# Patient Record
Sex: Female | Born: 1945
Health system: Southern US, Community
[De-identification: ages and names within clinical notes are randomized; demographics above are authoritative.]

## PROBLEM LIST (undated history)

## (undated) DIAGNOSIS — M1990 Unspecified osteoarthritis, unspecified site: Secondary | ICD-10-CM

## (undated) DIAGNOSIS — I10 Essential (primary) hypertension: Secondary | ICD-10-CM

## (undated) DIAGNOSIS — J449 Chronic obstructive pulmonary disease, unspecified: Secondary | ICD-10-CM

## (undated) DIAGNOSIS — C50919 Malignant neoplasm of unspecified site of unspecified female breast: Secondary | ICD-10-CM

## (undated) DIAGNOSIS — F32A Depression, unspecified: Secondary | ICD-10-CM

## (undated) DIAGNOSIS — E785 Hyperlipidemia, unspecified: Secondary | ICD-10-CM

## (undated) DIAGNOSIS — F329 Major depressive disorder, single episode, unspecified: Secondary | ICD-10-CM

## (undated) DIAGNOSIS — Z923 Personal history of irradiation: Secondary | ICD-10-CM

## (undated) HISTORY — DX: Malignant neoplasm of unspecified site of unspecified female breast: C50.919

## (undated) HISTORY — PX: ABDOMINAL HYSTERECTOMY: SHX81

## (undated) HISTORY — PX: OTHER SURGICAL HISTORY: SHX169

## (undated) HISTORY — DX: Essential (primary) hypertension: I10

## (undated) HISTORY — PX: TONSILLECTOMY: SUR1361

---

## 2004-08-21 ENCOUNTER — Other Ambulatory Visit: Payer: Self-pay

## 2004-08-22 ENCOUNTER — Ambulatory Visit: Payer: Self-pay | Admitting: General Surgery

## 2005-04-04 ENCOUNTER — Ambulatory Visit: Payer: Self-pay | Admitting: Internal Medicine

## 2006-09-27 ENCOUNTER — Ambulatory Visit: Payer: Self-pay

## 2011-01-24 ENCOUNTER — Ambulatory Visit: Payer: Self-pay | Admitting: Internal Medicine

## 2011-01-26 ENCOUNTER — Ambulatory Visit: Payer: Self-pay | Admitting: Internal Medicine

## 2011-02-01 ENCOUNTER — Ambulatory Visit: Payer: Self-pay | Admitting: Internal Medicine

## 2011-07-25 ENCOUNTER — Other Ambulatory Visit: Payer: Self-pay | Admitting: Internal Medicine

## 2011-07-25 MED ORDER — LISINOPRIL 20 MG PO TABS: 20.0000 mg | ORAL_TABLET | Freq: Every day | ORAL | Status: DC

## 2011-07-25 NOTE — Telephone Encounter (Signed)
Patient is out of her blood pressure medication

## 2011-08-07 ENCOUNTER — Encounter: Payer: Self-pay | Admitting: Internal Medicine

## 2011-08-07 ENCOUNTER — Ambulatory Visit (INDEPENDENT_AMBULATORY_CARE_PROVIDER_SITE_OTHER): Payer: Medicare Other | Admitting: Internal Medicine

## 2011-08-07 DIAGNOSIS — E785 Hyperlipidemia, unspecified: Secondary | ICD-10-CM

## 2011-08-07 DIAGNOSIS — Z23 Encounter for immunization: Secondary | ICD-10-CM

## 2011-08-07 DIAGNOSIS — M545 Low back pain, unspecified: Secondary | ICD-10-CM

## 2011-08-07 DIAGNOSIS — I10 Essential (primary) hypertension: Secondary | ICD-10-CM

## 2011-08-07 LAB — COMPREHENSIVE METABOLIC PANEL
ALT: 19 U/L (ref 0–35)
Albumin: 4.1 g/dL (ref 3.5–5.2)
CO2: 27 mEq/L (ref 19–32)
Calcium: 9.3 mg/dL (ref 8.4–10.5)
Chloride: 102 mEq/L (ref 96–112)
Creatinine, Ser: 0.9 mg/dL (ref 0.4–1.2)
GFR: 64.1 mL/min (ref >60.00)
Potassium: 4.6 mEq/L (ref 3.5–5.1)
Total Protein: 7.4 g/dL (ref 6.0–8.3)

## 2011-08-07 LAB — MICROALBUMIN / CREATININE URINE RATIO
Creatinine,U: 152.7 mg/dL
Microalb Creat Ratio: 0.4 mg/g (ref 0.0–30.0)
Microalb, Ur: 0.6 mg/dL (ref 0.0–1.9)

## 2011-08-07 LAB — LIPID PANEL: HDL: 62.1 mg/dL (ref >39.00)

## 2011-08-07 LAB — LDL CHOLESTEROL, DIRECT: Direct LDL: 143.4 mg/dL

## 2011-08-07 NOTE — Progress Notes (Signed)
Subjective:    Patient ID: Tammy Choi, female    DOB: 1946/01/06, 65 y.o.   MRN: [id number]  HPI 65 year old female with a history of hypertension and chronic low back pain presents for followup. She notes that she has been doing well. She reports full compliance with her medications. She did not bring a record of her blood pressures today. She denies any headache, palpitations, chest pain.  She continues to have right-sided hip and low back pain. This has been chronic and ongoing for years. She has been reluctant to pursue invasive intervention such as steroid injections. She reports good control of her pain using hydrocodone twice daily as needed. She would prefer to continue with this. She denies any weakness in her legs. She denies any loss of control of her bowel or bladder.  Outpatient Encounter Prescriptions as of 08/07/2011  Medication Sig Dispense Refill  . ALPRAZolam (XANAX) 0.5 MG tablet Take 0.5 mg by mouth 2 (two) times daily as needed.        Marland Kitchen FLUoxetine (PROZAC) 40 MG capsule Take 40 mg by mouth daily.        Marland Kitchen HYDROcodone-acetaminophen (LORTAB) 7.5-500 MG per tablet Take 2 tablets by mouth 2 (two) times daily as needed.        Marland Kitchen lisinopril (PRINIVIL,ZESTRIL) 20 MG tablet Take 1 tablet (20 mg total) by mouth daily.  30 tablet  11    Review of Systems  Constitutional: Negative for fever, chills, appetite change, fatigue and unexpected weight change.  HENT: Negative for ear pain, congestion, sore throat, trouble swallowing, neck pain, voice change and sinus pressure.   Eyes: Negative for visual disturbance.  Respiratory: Negative for cough, shortness of breath, wheezing and stridor.   Cardiovascular: Negative for chest pain, palpitations and leg swelling.  Gastrointestinal: Negative for nausea, vomiting, abdominal pain, diarrhea, constipation, blood in stool, abdominal distention and anal bleeding.  Genitourinary: Negative for dysuria and flank pain.  Musculoskeletal:  Positive for myalgias and back pain. Negative for arthralgias and gait problem.  Skin: Negative for color change and rash.  Neurological: Negative for dizziness and headaches.  Hematological: Negative for adenopathy. Does not bruise/bleed easily.  Psychiatric/Behavioral: Negative for suicidal ideas, sleep disturbance and dysphoric mood. The patient is not nervous/anxious.    BP 138/78  Pulse 85  Temp(Src) 98.2 F (36.8 C) (Oral)  Ht 5\' 6"  (1.676 m)  Wt 172 lb (78.019 kg)  BMI 27.76 kg/m2  SpO2 96%     Objective:   Physical Exam  Constitutional: She is oriented to person, place, and time. She appears well-developed and well-nourished. No distress.  HENT:  Head: Normocephalic and atraumatic.  Right Ear: External ear normal.  Left Ear: External ear normal.  Nose: Nose normal.  Mouth/Throat: Oropharynx is clear and moist. No oropharyngeal exudate.  Eyes: Conjunctivae are normal. Pupils are equal, round, and reactive to light. Right eye exhibits no discharge. Left eye exhibits no discharge. No scleral icterus.  Neck: Normal range of motion. Neck supple. No tracheal deviation present. No thyromegaly present.  Cardiovascular: Normal rate, regular rhythm, normal heart sounds and intact distal pulses.  Exam reveals no gallop and no friction rub.   No murmur heard. Pulmonary/Chest: Effort normal and breath sounds normal. No respiratory distress. She has no wheezes. She has no rales. She exhibits no tenderness.  Musculoskeletal: Normal range of motion. She exhibits no edema and no tenderness.       Lumbar back: She exhibits tenderness.  Back:  Lymphadenopathy:    She has no cervical adenopathy.  Neurological: She is alert and oriented to person, place, and time. No cranial nerve deficit. She exhibits normal muscle tone. Coordination normal.  Skin: Skin is warm and dry. No rash noted. She is not diaphoretic. No erythema. No pallor.  Psychiatric: She has a normal mood and affect. Her  behavior is normal. Judgment and thought content normal.          Assessment & Plan:  1. Hypertension -BP well-controlled on current medications. We'll check renal function with labs today. Patient will followup in 6 months.  2. Chronic sciatic/low back pain -patient reports good control with hydrocodone as needed. We discussed additional intervention such as steroid injections and evaluation by pain management, however she would prefer to defer this for now. Patient will followup in 6 months or earlier if needed.

## 2011-08-20 ENCOUNTER — Other Ambulatory Visit: Payer: Self-pay | Admitting: Internal Medicine

## 2011-09-05 ENCOUNTER — Ambulatory Visit: Payer: Self-pay | Admitting: Internal Medicine

## 2011-09-27 ENCOUNTER — Other Ambulatory Visit: Payer: Self-pay | Admitting: Internal Medicine

## 2011-09-27 ENCOUNTER — Encounter: Payer: Self-pay | Admitting: Internal Medicine

## 2011-09-28 ENCOUNTER — Encounter: Payer: Self-pay | Admitting: Internal Medicine

## 2011-10-22 ENCOUNTER — Other Ambulatory Visit: Payer: Self-pay | Admitting: Internal Medicine

## 2011-10-22 NOTE — Telephone Encounter (Signed)
Fine to refill Rx. Pt needs follow up to address elevated cholesterol. Should have repeat LFTs and lipids before 01/30/2012

## 2011-10-25 ENCOUNTER — Other Ambulatory Visit: Payer: Self-pay | Admitting: Internal Medicine

## 2011-10-29 ENCOUNTER — Other Ambulatory Visit: Payer: Self-pay | Admitting: Internal Medicine

## 2011-11-27 ENCOUNTER — Other Ambulatory Visit: Payer: Self-pay | Admitting: Internal Medicine

## 2011-11-27 NOTE — Telephone Encounter (Signed)
Fine to fill. 

## 2011-11-28 ENCOUNTER — Telehealth: Payer: Self-pay | Admitting: *Deleted

## 2011-11-28 NOTE — Telephone Encounter (Signed)
Pharm faxed RF request -  Glipizide 5 mg 1 bid. OK?

## 2011-11-28 NOTE — Telephone Encounter (Signed)
OK to fill

## 2011-11-29 ENCOUNTER — Other Ambulatory Visit: Payer: Self-pay | Admitting: Internal Medicine

## 2011-11-29 MED ORDER — GLIPIZIDE 5 MG PO TABS: 5.0000 mg | ORAL_TABLET | Freq: Two times a day (BID) | ORAL | Status: DC

## 2011-11-29 NOTE — Telephone Encounter (Signed)
Done

## 2011-12-03 ENCOUNTER — Telehealth: Payer: Self-pay | Admitting: *Deleted

## 2011-12-03 NOTE — Telephone Encounter (Signed)
If she is going to continue on Hydrocodone as chronic pain med (she is using 2-3 per day), she will need to come in every 3 months for visit and sign pain contract.  She will need to give urine sample for UDS. So, needs to be seen prior to refill.

## 2011-12-03 NOTE — Telephone Encounter (Signed)
1. Pt got RX for glipizide but states she has never been on this med. I specifically remember fax from pharm for pt for this RX. Med removed from list. 2. Patient is requesting RF for hydrocodone, she would like enough until f/u OV in May. OK?

## 2011-12-03 NOTE — Telephone Encounter (Signed)
Informed patient that she would need OV for RF (offered apt's this week), pt stated that she had some at this time and would call office to schedule apt when needed.

## 2012-01-15 ENCOUNTER — Ambulatory Visit (INDEPENDENT_AMBULATORY_CARE_PROVIDER_SITE_OTHER): Payer: Medicare Other | Admitting: Internal Medicine

## 2012-01-15 ENCOUNTER — Encounter: Payer: Self-pay | Admitting: Internal Medicine

## 2012-01-15 VITALS — BP 102/62 | HR 91 | Temp 98.2°F | Ht 66.0 in | Wt 172.0 lb

## 2012-01-15 DIAGNOSIS — F329 Major depressive disorder, single episode, unspecified: Secondary | ICD-10-CM | POA: Insufficient documentation

## 2012-01-15 DIAGNOSIS — M549 Dorsalgia, unspecified: Secondary | ICD-10-CM

## 2012-01-15 DIAGNOSIS — E785 Hyperlipidemia, unspecified: Secondary | ICD-10-CM | POA: Insufficient documentation

## 2012-01-15 DIAGNOSIS — I10 Essential (primary) hypertension: Secondary | ICD-10-CM

## 2012-01-15 DIAGNOSIS — F32A Depression, unspecified: Secondary | ICD-10-CM | POA: Insufficient documentation

## 2012-01-15 DIAGNOSIS — G8929 Other chronic pain: Secondary | ICD-10-CM | POA: Insufficient documentation

## 2012-01-15 LAB — COMPREHENSIVE METABOLIC PANEL
AST: 20 U/L (ref 0–37)
BUN: 18 mg/dL (ref 6–23)
Calcium: 8.9 mg/dL (ref 8.4–10.5)
Chloride: 103 mEq/L (ref 96–112)
Creatinine, Ser: 0.8 mg/dL (ref 0.4–1.2)
GFR: 72.99 mL/min (ref >60.00)

## 2012-01-15 LAB — LIPID PANEL
Cholesterol: 208 mg/dL — ABNORMAL HIGH (ref 0–200)
HDL: 58 mg/dL (ref >39.00)
Triglycerides: 126 mg/dL (ref 0.0–149.0)

## 2012-01-15 LAB — LDL CHOLESTEROL, DIRECT: Direct LDL: 128.3 mg/dL

## 2012-01-15 MED ORDER — FLUOXETINE HCL 40 MG PO CAPS: 40.0000 mg | ORAL_CAPSULE | Freq: Every day | ORAL | Status: DC

## 2012-01-15 MED ORDER — HYDROCODONE-ACETAMINOPHEN 7.5-500 MG PO TABS: 1.0000 | ORAL_TABLET | Freq: Three times a day (TID) | ORAL | Status: DC | PRN

## 2012-01-15 NOTE — Assessment & Plan Note (Signed)
Symptoms well controlled on Prozac. Will continue.

## 2012-01-15 NOTE — Assessment & Plan Note (Signed)
Secondary to degenerative arthritis in the spine. Currently well-controlled with occasional use of hydrocodone. Patient is not wish to pursue referral to pain management at this time. She is not interested in potential surgical interventions. We'll continue to monitor.

## 2012-01-15 NOTE — Progress Notes (Signed)
Subjective:    Patient ID: Tammy Choi, female    DOB: 1945-12-21, 66 y.o.   MRN: [id number]  HPI 66 year old female with history of depression, chronic back pain secondary to degenerative arthritis of the spine, and hypertension presents for followup. She reports that she is generally doing well. She reports that her chronic pain in her lower back is well-controlled with the occasional use of hydrocodone. Her pain is typically exacerbated by cooler weather. The maximum amount of hydrocodone she has used would be 2 tablets in one day. She is not interested in referral to the pain clinic or in surgical interventions at this time.  In regards to her depression, symptoms are well controlled with Prozac. She requests refill today.  In regards to hypertension, she reports compliance with her lisinopril. She denies any chest pain, headache, or other concerns.  Outpatient Encounter Prescriptions as of 01/15/2012  Medication Sig Dispense Refill  . ALPRAZolam (XANAX) 0.5 MG tablet TAKE ONE TABLET BY MOUTH TWICE DAILY  60 tablet  3  . cholecalciferol (VITAMIN D) 1000 UNITS tablet Take 2,000 Units by mouth daily.      Marland Kitchen FLUoxetine (PROZAC) 40 MG capsule Take 1 capsule (40 mg total) by mouth daily.  90 capsule  2  . HYDROcodone-acetaminophen (LORTAB) 7.5-500 MG per tablet Take 1 tablet by mouth every 8 (eight) hours as needed for pain.  90 tablet  0  . lisinopril (PRINIVIL,ZESTRIL) 20 MG tablet Take 1 tablet (20 mg total) by mouth daily.  30 tablet  11  . Multiple Vitamins-Minerals (MULTIVITAMIN WITH MINERALS) tablet Take 1 tablet by mouth daily.      Marland Kitchen DISCONTD: FLUoxetine (PROZAC) 40 MG capsule Take 40 mg by mouth daily.        Marland Kitchen DISCONTD: HYDROcodone-acetaminophen (LORTAB) 7.5-500 MG per tablet TAKE ONE TO TWO TABLETS BY MOUTH TWICE DAILY  90 tablet  0    Review of Systems  Constitutional: Negative for fever, chills, appetite change, fatigue and unexpected weight change.  HENT: Negative for  ear pain, congestion, sore throat, trouble swallowing, neck pain, voice change and sinus pressure.   Eyes: Negative for visual disturbance.  Respiratory: Negative for cough, shortness of breath, wheezing and stridor.   Cardiovascular: Negative for chest pain, palpitations and leg swelling.  Gastrointestinal: Negative for nausea, vomiting, abdominal pain, diarrhea, constipation, blood in stool, abdominal distention and anal bleeding.  Genitourinary: Negative for dysuria and flank pain.  Musculoskeletal: Positive for back pain and arthralgias. Negative for myalgias and gait problem.  Skin: Negative for color change and rash.  Neurological: Negative for dizziness and headaches.  Hematological: Negative for adenopathy. Does not bruise/bleed easily.  Psychiatric/Behavioral: Negative for suicidal ideas, sleep disturbance and dysphoric mood. The patient is not nervous/anxious.    BP 102/62  Pulse 91  Temp(Src) 98.2 F (36.8 C) (Oral)  Ht 5\' 6"  (1.676 m)  Wt 172 lb (78.019 kg)  BMI 27.76 kg/m2  SpO2 98%     Objective:   Physical Exam  Constitutional: She is oriented to person, place, and time. She appears well-developed and well-nourished. No distress.  HENT:  Head: Normocephalic and atraumatic.  Right Ear: External ear normal.  Left Ear: External ear normal.  Nose: Nose normal.  Mouth/Throat: Oropharynx is clear and moist. No oropharyngeal exudate.  Eyes: Conjunctivae are normal. Pupils are equal, round, and reactive to light. Right eye exhibits no discharge. Left eye exhibits no discharge. No scleral icterus.  Neck: Normal range of motion. Neck supple. No tracheal  deviation present. No thyromegaly present.  Cardiovascular: Normal rate, regular rhythm, normal heart sounds and intact distal pulses.  Exam reveals no gallop and no friction rub.   No murmur heard. Pulmonary/Chest: Effort normal and breath sounds normal. No respiratory distress. She has no wheezes. She has no rales. She  exhibits no tenderness.  Musculoskeletal: She exhibits no edema and no tenderness.       Lumbar back: She exhibits decreased range of motion and pain. She exhibits no tenderness.  Lymphadenopathy:    She has no cervical adenopathy.  Neurological: She is alert and oriented to person, place, and time. No cranial nerve deficit. She exhibits normal muscle tone. Coordination normal.  Skin: Skin is warm and dry. No rash noted. She is not diaphoretic. No erythema. No pallor.  Psychiatric: She has a normal mood and affect. Her behavior is normal. Judgment and thought content normal.          Assessment & Plan:

## 2012-01-15 NOTE — Assessment & Plan Note (Signed)
Blood pressure is well-controlled today. Will check renal function with labs. Followup here in 6 months.

## 2012-02-04 ENCOUNTER — Ambulatory Visit: Payer: Medicare Other | Admitting: Internal Medicine

## 2012-03-20 ENCOUNTER — Other Ambulatory Visit: Payer: Self-pay | Admitting: Internal Medicine

## 2012-03-20 NOTE — Telephone Encounter (Signed)
Rx called to [facility]

## 2012-04-17 ENCOUNTER — Other Ambulatory Visit: Payer: Self-pay | Admitting: Internal Medicine

## 2012-04-18 NOTE — Telephone Encounter (Signed)
Rx called to [facility]

## 2012-07-16 ENCOUNTER — Ambulatory Visit (INDEPENDENT_AMBULATORY_CARE_PROVIDER_SITE_OTHER): Payer: Medicare Other | Admitting: Internal Medicine

## 2012-07-16 ENCOUNTER — Encounter: Payer: Self-pay | Admitting: Internal Medicine

## 2012-07-16 VITALS — BP 120/82 | HR 88 | Temp 98.7°F | Ht 66.0 in | Wt 172.5 lb

## 2012-07-16 DIAGNOSIS — Z1211 Encounter for screening for malignant neoplasm of colon: Secondary | ICD-10-CM

## 2012-07-16 DIAGNOSIS — M549 Dorsalgia, unspecified: Secondary | ICD-10-CM

## 2012-07-16 DIAGNOSIS — Z23 Encounter for immunization: Secondary | ICD-10-CM

## 2012-07-16 DIAGNOSIS — D649 Anemia, unspecified: Secondary | ICD-10-CM

## 2012-07-16 DIAGNOSIS — I10 Essential (primary) hypertension: Secondary | ICD-10-CM

## 2012-07-16 DIAGNOSIS — E785 Hyperlipidemia, unspecified: Secondary | ICD-10-CM

## 2012-07-16 DIAGNOSIS — G8929 Other chronic pain: Secondary | ICD-10-CM

## 2012-07-16 DIAGNOSIS — Z0000 Encounter for general adult medical examination without abnormal findings: Secondary | ICD-10-CM | POA: Insufficient documentation

## 2012-07-16 LAB — CBC WITH DIFFERENTIAL/PLATELET
Basophils Relative: 0.5 % (ref 0.0–3.0)
Eosinophils Relative: 0.9 % (ref 0.0–5.0)
Lymphocytes Relative: 32.6 % (ref 12.0–46.0)
MCV: 88.2 fl (ref 78.0–100.0)
Monocytes Absolute: 0.7 10*3/uL (ref 0.1–1.0)
Neutrophils Relative %: 59.9 % (ref 43.0–77.0)
Platelets: 264 10*3/uL (ref 150.0–400.0)
RBC: 4.79 Mil/uL (ref 3.87–5.11)
WBC: 11.6 10*3/uL — ABNORMAL HIGH (ref 4.5–10.5)

## 2012-07-16 LAB — LIPID PANEL
Cholesterol: 222 mg/dL — ABNORMAL HIGH (ref 0–200)
HDL: 52.3 mg/dL (ref >39.00)
Total CHOL/HDL Ratio: 4
Triglycerides: 147 mg/dL (ref 0.0–149.0)

## 2012-07-16 LAB — COMPREHENSIVE METABOLIC PANEL
Albumin: 3.8 g/dL (ref 3.5–5.2)
Alkaline Phosphatase: 75 U/L (ref 39–117)
BUN: 22 mg/dL (ref 6–23)
CO2: 28 mEq/L (ref 19–32)
Calcium: 9.2 mg/dL (ref 8.4–10.5)
Chloride: 100 mEq/L (ref 96–112)
GFR: 77.15 mL/min (ref >60.00)
Glucose, Bld: 91 mg/dL (ref 70–99)
Potassium: 4.1 mEq/L (ref 3.5–5.1)
Sodium: 136 mEq/L (ref 135–145)
Total Protein: 7.2 g/dL (ref 6.0–8.3)

## 2012-07-16 LAB — MICROALBUMIN / CREATININE URINE RATIO: Microalb Creat Ratio: 0.8 mg/g (ref 0.0–30.0)

## 2012-07-16 LAB — LDL CHOLESTEROL, DIRECT: Direct LDL: 156.4 mg/dL

## 2012-07-16 NOTE — Progress Notes (Signed)
Subjective:    Patient ID: Tammy Choi, female    DOB: 18-Jul-1946, 66 y.o.   MRN: [id number]  HPI The patient is here for annual Medicare wellness examination and management of other chronic and acute problems.   The risk factors are reflected in the social history.  The roster of all physicians providing medical care to patient - is listed in the Snapshot section of the chart.  Activities of daily living:  The patient is 100% independent in all ADLs: dressing, toileting, feeding as well as independent mobility  Home safety : The patient has smoke detectors in the home. They wear seatbelts.  There are no firearms at home. There is no violence in the home.   There is no risks for hepatitis, STDs or HIV. There is no history of blood transfusion. They have no travel history to infectious disease endemic areas of the world.  The patient has not seen their dentist in the last six month. (Has dentures, Dr. Audley Hose) They have not seen their eye doctor in the last year. Will schedule appointment. Dermatologist - Dr. Jarold Motto. Will schedule. No issues with hearing. They have deferred audiologic testing in the last year.   They do not  have excessive sun exposure. Discussed the need for sun protection: hats, long sleeves and use of sunscreen if there is significant sun exposure.   Diet: the importance of a healthy diet is discussed. They do have a healthy diet.  The benefits of regular aerobic exercise were discussed. She walks every day if possible.  Depression screen: there are no signs or vegative symptoms of depression- irritability, change in appetite, anhedonia, sadness/tearfullness.  Cognitive assessment: the patient manages all their financial and personal affairs and is actively engaged. They could relate day,date,year and events.  The following portions of the patient's history were reviewed and updated as appropriate: allergies, current medications, past family history,  past medical history,  past surgical history, past social history  and problem list.  Visual acuity was not assessed per patient preference since she has regular follow up with her ophthalmologist. Hearing and body mass index were assessed and reviewed.   During the course of the visit the patient was educated and counseled about appropriate screening and preventive services including : fall prevention , diabetes screening, nutrition counseling, colorectal cancer screening, and recommended immunizations.     Outpatient Encounter Prescriptions as of 07/16/2012  Medication Sig Dispense Refill  . ALPRAZolam (XANAX) 0.5 MG tablet TAKE ONE TABLET BY MOUTH TWICE DAILY  60 tablet  1  . cholecalciferol (VITAMIN D) 1000 UNITS tablet Take 2,000 Units by mouth daily.      Marland Kitchen FLUoxetine (PROZAC) 40 MG capsule Take 1 capsule (40 mg total) by mouth daily.  90 capsule  2  . HYDROcodone-acetaminophen (LORTAB) 7.5-500 MG per tablet TAKE ONE TABLET BY MOUTH EVERY 8 HOURS AS NEEDED FOR PAIN  90 tablet  3  . lisinopril (PRINIVIL,ZESTRIL) 20 MG tablet Take 1 tablet (20 mg total) by mouth daily.  30 tablet  11  . Multiple Vitamins-Minerals (MULTIVITAMIN WITH MINERALS) tablet Take 1 tablet by mouth daily.       BP 120/82  Pulse 88  Temp 98.7 F (37.1 C) (Oral)  Ht 5\' 6"  (1.676 m)  Wt 172 lb 8 oz (78.245 kg)  BMI 27.84 kg/m2  SpO2 97%  Review of Systems  Constitutional: Negative for fever, chills, appetite change, fatigue and unexpected weight change.  HENT: Negative for ear pain, congestion, sore  throat, trouble swallowing, neck pain, voice change and sinus pressure.   Eyes: Negative for visual disturbance.  Respiratory: Negative for cough, shortness of breath, wheezing and stridor.   Cardiovascular: Negative for chest pain, palpitations and leg swelling.  Gastrointestinal: Negative for nausea, vomiting, abdominal pain, diarrhea, constipation, blood in stool, abdominal distention and anal bleeding.    Genitourinary: Negative for dysuria and flank pain.  Musculoskeletal: Positive for back pain and arthralgias. Negative for myalgias and gait problem.  Skin: Negative for color change and rash.  Neurological: Negative for dizziness and headaches.  Hematological: Negative for adenopathy. Does not bruise/bleed easily.  Psychiatric/Behavioral: Negative for suicidal ideas, disturbed wake/sleep cycle and dysphoric mood. The patient is not nervous/anxious.        Objective:   Physical Exam  Constitutional: She is oriented to person, place, and time. She appears well-developed and well-nourished. No distress.  HENT:  Head: Normocephalic and atraumatic.  Right Ear: External ear normal.  Left Ear: External ear normal.  Nose: Nose normal.  Mouth/Throat: Oropharynx is clear and moist. No oropharyngeal exudate.  Eyes: Conjunctivae normal are normal. Pupils are equal, round, and reactive to light. Right eye exhibits no discharge. Left eye exhibits no discharge. No scleral icterus.  Neck: Normal range of motion. Neck supple. No tracheal deviation present. No thyromegaly present.  Cardiovascular: Normal rate, regular rhythm, normal heart sounds and intact distal pulses.  Exam reveals no gallop and no friction rub.   No murmur heard. Pulmonary/Chest: Effort normal and breath sounds normal. No accessory muscle usage. Not tachypneic. No respiratory distress. She has no decreased breath sounds. She has no wheezes. She has no rhonchi. She has no rales. She exhibits no tenderness. Right breast exhibits no inverted nipple, no mass, no nipple discharge, no skin change and no tenderness. Left breast exhibits no inverted nipple, no mass, no nipple discharge, no skin change and no tenderness. Breasts are symmetrical.  Abdominal: Soft. Bowel sounds are normal. She exhibits no distension and no mass. There is no tenderness. There is no rebound and no guarding.  Musculoskeletal: Normal range of motion. She exhibits no  edema and no tenderness.  Lymphadenopathy:    She has no cervical adenopathy.  Neurological: She is alert and oriented to person, place, and time. No cranial nerve deficit. She exhibits normal muscle tone. Coordination normal.  Skin: Skin is warm and dry. No rash noted. She is not diaphoretic. No erythema. No pallor.  Psychiatric: She has a normal mood and affect. Her behavior is normal. Judgment and thought content normal.          Assessment & Plan:

## 2012-07-16 NOTE — Assessment & Plan Note (Signed)
General medical exam including breast exam normal today. Pap and pelvic exam deferred because of patient's age and status post hysterectomy. Health maintenance is up to date except for colonoscopy and mammogram which will be scheduled. Will check basic labs today including renal function, cholesterol, blood counts. Appropriate screening performed. Followup 6 months or sooner as needed.

## 2012-07-16 NOTE — Assessment & Plan Note (Signed)
Blood pressure well-controlled. Will continue lisinopril. Will check renal function with labs today. Followup in 6 months or sooner as needed.

## 2012-07-16 NOTE — Assessment & Plan Note (Signed)
Secondary to degenerative arthritis in the spine. Patient is doing well with intermittent use of Lortab for severe pain. We'll plan to continue. Follow up 6 months or sooner as needed.

## 2012-07-17 ENCOUNTER — Other Ambulatory Visit: Payer: Self-pay | Admitting: Internal Medicine

## 2012-07-17 ENCOUNTER — Telehealth: Payer: Self-pay | Admitting: *Deleted

## 2012-07-17 DIAGNOSIS — E785 Hyperlipidemia, unspecified: Secondary | ICD-10-CM

## 2012-07-17 MED ORDER — ATORVASTATIN CALCIUM 20 MG PO TABS: 20.0000 mg | ORAL_TABLET | Freq: Every day | ORAL | Status: DC

## 2012-07-17 NOTE — Telephone Encounter (Signed)
Rx sent to pharmacy, lab orders placed for 1 month, pt informed.

## 2012-07-17 NOTE — Telephone Encounter (Signed)
Message copied by Carin Primrose on Thu Jul 17, 2012 10:10 AM ------      Message from: Ronna Polio A      Created: Thu Jul 17, 2012  8:26 AM       Can you please call Lipitor 20mg  daily #30 with 3 refills. Needs repeat LFTs and lipids in 1 month.

## 2012-07-18 NOTE — Telephone Encounter (Signed)
Rx for Alprazolam called to [facility] pharmacy.

## 2012-07-23 ENCOUNTER — Telehealth: Payer: Self-pay | Admitting: Internal Medicine

## 2012-07-24 ENCOUNTER — Ambulatory Visit (INDEPENDENT_AMBULATORY_CARE_PROVIDER_SITE_OTHER): Payer: Medicare Other | Admitting: Internal Medicine

## 2012-07-24 ENCOUNTER — Ambulatory Visit (INDEPENDENT_AMBULATORY_CARE_PROVIDER_SITE_OTHER)
Admission: RE | Admit: 2012-07-24 | Discharge: 2012-07-24 | Disposition: A | Discharge status: Home / Self Care | Payer: Medicare Other | Source: Ambulatory Visit | Attending: Internal Medicine | Admitting: Internal Medicine

## 2012-07-24 ENCOUNTER — Encounter: Payer: Self-pay | Admitting: Internal Medicine

## 2012-07-24 VITALS — BP 128/72 | HR 94 | Temp 98.2°F | Ht 66.0 in | Wt 175.5 lb

## 2012-07-24 DIAGNOSIS — R062 Wheezing: Secondary | ICD-10-CM

## 2012-07-24 MED ORDER — ALBUTEROL SULFATE HFA 108 (90 BASE) MCG/ACT IN AERS: 2.0000 | INHALATION_SPRAY | Freq: Four times a day (QID) | RESPIRATORY_TRACT | Status: DC | PRN

## 2012-07-24 MED ORDER — PREDNISONE 10 MG PO TABS: 10.0000 mg | ORAL_TABLET | Freq: Every day | ORAL | Status: DC

## 2012-07-24 MED ORDER — ALBUTEROL SULFATE (2.5 MG/3ML) 0.083% IN NEBU: 2.5000 mg | INHALATION_SOLUTION | Freq: Four times a day (QID) | RESPIRATORY_TRACT | Status: DC | PRN

## 2012-07-24 MED ORDER — LEVOFLOXACIN 500 MG PO TABS: 500.0000 mg | ORAL_TABLET | Freq: Every day | ORAL | Status: DC

## 2012-07-24 MED ORDER — FLUTICASONE PROPIONATE 50 MCG/ACT NA SUSP: 2.0000 | Freq: Every day | NASAL | Status: DC

## 2012-07-24 MED ORDER — FLUTICASONE PROPIONATE HFA 110 MCG/ACT IN AERO: 2.0000 | INHALATION_SPRAY | Freq: Two times a day (BID) | RESPIRATORY_TRACT | Status: DC

## 2012-07-24 NOTE — Patient Instructions (Signed)
It was nice meeting you today.  I am sorry you have not been feeling well.  I am going to give you an antibiotic (Levaquin) to take once per day.  I also want you to take Mucinex in the am and Robitussin in the evening.  Flonase nasal spray - two sprays each nostril in the evening and flush with saline nasal spray - 2-3x/day.  I also feel that you need a Prednisone taper.  Take as instructed.  Use the inhalers as instructed.  Let us know if symptoms worsen or do not resolve.  We will notify you of your chest xray results once they are available.

## 2012-07-25 ENCOUNTER — Telehealth: Payer: Self-pay | Admitting: *Deleted

## 2012-07-25 NOTE — Progress Notes (Signed)
  Subjective:    Patient ID: Tammy Choi, female    DOB: 06/29/1946, 66 y.o.   MRN: [id number]  HPI 66 year old female with past history of hypertension who comes in today as a workin with concerns regarding increased cough and congestion.  States symptoms started with a sore throat approximately one week ago.  Now has increased sinus pressure, nasal congestion productive of yellow/green mucus production.  Ears itch.  Increased post nasal drainage.  Productive cough (colored mucus).  Increased wheezing.  Tmax 101.  States she is eating and drinking well.  No vomiting.  Has had some nausea, but not affecting eating.  Continues to smoke.  Lives with her daughter.  States her granddaughter has been sick with similar symptoms.  Took Nyquil two days ago and states this "dried her out" too much.    Past Medical History  Diagnosis Date  . HTN (hypertension)     Review of Systems Patient denies any headache, lightheadedness or dizziness.  Does report increased sinus pressure as outlined.  No earache.  Ears itch.  No chest pain or palpatations.  She does report the increased cough and congestion, wheezing and tightness in the chest.  Increased cough with attempts at taking a full breath.  No vomiting.  No abdominal pain or cramping.  No bowel change, such as diarrhea, constipation, BRBPR or melana.  No urine change.        Objective:   Physical Exam Filed Vitals:   07/24/12 0915  BP: 128/72  Pulse: 94  Temp: 98.2 F (80.30 C)   66 year old female in no acute distress.   HEENT:  Nares - clear except erythematous turbinates.  TMs visualized - without erythema.  OP- without lesions or erythema.  Increased tenderness to palpation over the sinuses. NECK:  Supple, nontender.    HEART:  Appears to be regular. LUNGS:  Without crackles or wheezing audible.  Respirations even and unlabored.   RADIAL PULSE:  Equal bilaterally.                   Assessment & Plan:  SINUSITIS/URI/BRONCHITIS.   Symptoms as outlined.  Will check CXR.  Albuterol neb given here in the office.  Noted increased air movement after the neb.  Will treat with Levaquin 500mg  q day as directed.  Mucinex DM in the am and Robitussin DM in the evening.  Flonase nasal spray and saline nasal flushes as directed.  Flovent bid and Albuterol HFA as directed.  Prednisone taper starting at 60mg  and decreasing by 5mg  each day until off.  Discussed possible side effects and risk of Prednisone therapy.  States she has taken previously and tolerated. Rest.  Fluids.  Explained to her if symptoms changed, worsened or did not resolve - she needed reevaluation.  Discussed need to quit smoking.

## 2012-07-28 ENCOUNTER — Telehealth: Payer: Self-pay | Admitting: *Deleted

## 2012-07-28 ENCOUNTER — Encounter: Payer: Self-pay | Admitting: Internal Medicine

## 2012-07-28 NOTE — Telephone Encounter (Signed)
Called patient at home with results.

## 2012-08-08 NOTE — Telephone Encounter (Signed)
Opened in error

## 2012-08-10 ENCOUNTER — Other Ambulatory Visit: Payer: Self-pay | Admitting: Internal Medicine

## 2012-08-18 ENCOUNTER — Other Ambulatory Visit: Payer: Medicare Other

## 2012-09-03 ENCOUNTER — Telehealth: Payer: Self-pay | Admitting: Internal Medicine

## 2012-09-03 NOTE — Telephone Encounter (Signed)
Pt has miss placed her rx for Lipitor. She uses [facility] on [address] and she does not have any left.

## 2012-09-03 NOTE — Telephone Encounter (Signed)
I believe this is Dr Tilman Neat pt.  I think she will be ok with refilling, but wanted to send to you to confirm.

## 2012-09-04 MED ORDER — ATORVASTATIN CALCIUM 20 MG PO TABS: 20.0000 mg | ORAL_TABLET | Freq: Every day | ORAL | Status: DC

## 2012-09-04 NOTE — Telephone Encounter (Signed)
Meds filled

## 2012-09-05 ENCOUNTER — Ambulatory Visit: Payer: Self-pay | Admitting: Internal Medicine

## 2012-09-05 ENCOUNTER — Telehealth: Payer: Self-pay | Admitting: Internal Medicine

## 2012-09-05 DIAGNOSIS — N6320 Unspecified lump in the left breast, unspecified quadrant: Secondary | ICD-10-CM | POA: Insufficient documentation

## 2012-09-05 NOTE — Telephone Encounter (Signed)
Mammogram 09/05/2012, recommended compression views of right breast.  Has this been ordered? Please make sure pt has follow up to discuss. Thanks

## 2012-09-10 NOTE — Telephone Encounter (Signed)
Called pt no answer °

## 2012-09-11 NOTE — Telephone Encounter (Signed)
Pt is having these additional views done tomorrow morning. Advised to call office to set up follow up afterwards.

## 2012-09-12 ENCOUNTER — Ambulatory Visit: Payer: Self-pay | Admitting: Internal Medicine

## 2012-09-15 ENCOUNTER — Telehealth: Payer: Self-pay | Admitting: Internal Medicine

## 2012-09-15 ENCOUNTER — Other Ambulatory Visit: Payer: Medicare Other

## 2012-09-15 DIAGNOSIS — R928 Other abnormal and inconclusive findings on diagnostic imaging of breast: Secondary | ICD-10-CM

## 2012-09-15 NOTE — Telephone Encounter (Signed)
Recent compression views on mammogram and US of the RIGHT breast showed normal tissue, but when they did an [place] on the left breast for comparison, they saw a nodular area in the left breast. They have recommended surgical evaluation.  We should make sure that: 1. This was communicated to the pt 2. The pt has been scheduled to see a surgeon 3. Pt has follow up with me to discuss findings.

## 2012-09-16 NOTE — Telephone Encounter (Signed)
OK. Will place referral order.

## 2012-09-16 NOTE — Telephone Encounter (Signed)
LMOVM for pt to return call 

## 2012-09-16 NOTE — Telephone Encounter (Signed)
Pt advised that she was never notified of the results. Would like for a referral to be made for surgeon. However pt will be out of town until January 1st.

## 2012-09-25 ENCOUNTER — Encounter: Payer: Self-pay | Admitting: Internal Medicine

## 2012-10-01 HISTORY — PX: COLONOSCOPY: SHX174

## 2012-10-15 ENCOUNTER — Other Ambulatory Visit: Payer: Self-pay | Admitting: Internal Medicine

## 2012-10-15 MED ORDER — HYDROCODONE-ACETAMINOPHEN 5-325 MG PO TABS: 1.0000 | ORAL_TABLET | Freq: Three times a day (TID) | ORAL | Status: DC | PRN

## 2012-10-15 NOTE — Telephone Encounter (Signed)
We can call in Hydrocodone-Acetaminophen 5-325mg  po tid prn pain #90 with 1 refill. Please let her know that the FDA is changing restrictions on hydrocodone, and if she will need to continue this long term for back pain, we will need to set up referral at the pain clinic.

## 2012-10-15 NOTE — Telephone Encounter (Signed)
This product is no longer available HYDROcodone-acetaminophen (LORTAB) 7.5-500 MG per tablet Please advise

## 2012-10-15 NOTE — Telephone Encounter (Signed)
Rx called to [facility] pharmacy, patient advised via telephone.

## 2012-10-16 ENCOUNTER — Telehealth: Payer: Self-pay | Admitting: *Deleted

## 2012-10-16 NOTE — Telephone Encounter (Signed)
Patient called wanting to know the results of her biopsy.  I called and spoke with Archie Patten at [facility] and was advised that the biopsy was done on 10/19/2012 and often times it can take up to one week for the results to be in.  The patient will be notified of results by either the doctor or nurse once they become available.  I left a message on machine at home for patient to return call.

## 2012-10-17 NOTE — Telephone Encounter (Signed)
Patient called back she received her biopsy results.

## 2012-11-10 ENCOUNTER — Other Ambulatory Visit: Payer: Self-pay | Admitting: Internal Medicine

## 2012-11-10 NOTE — Telephone Encounter (Signed)
Has not been seen in awhile and has cancelled several appointments. Ok to refill Xanax

## 2013-01-06 ENCOUNTER — Encounter: Payer: Self-pay | Admitting: Internal Medicine

## 2013-01-06 ENCOUNTER — Ambulatory Visit (INDEPENDENT_AMBULATORY_CARE_PROVIDER_SITE_OTHER): Payer: Medicare Other | Admitting: Internal Medicine

## 2013-01-06 VITALS — BP 136/86 | HR 98 | Temp 99.1°F | Wt 169.0 lb

## 2013-01-06 DIAGNOSIS — J441 Chronic obstructive pulmonary disease with (acute) exacerbation: Secondary | ICD-10-CM

## 2013-01-06 MED ORDER — LEVOFLOXACIN 500 MG PO TABS: 500.0000 mg | ORAL_TABLET | Freq: Every day | ORAL | Status: DC

## 2013-01-06 MED ORDER — PREDNISONE (PAK) 10 MG PO TABS: ORAL_TABLET | ORAL | Status: DC

## 2013-01-06 NOTE — Assessment & Plan Note (Signed)
Symptoms and exam consistent with COPD exacerbation from acute bronchitis. Will start levaquin and prednisone taper. Albuterol/atrovent given in clinic. Will continue inhaled bronchodilators at home. Encouraged smoking cessation.  Pt will follow up if symptoms not improving over next 48hr. RTC for recheck in 2 weeks.

## 2013-01-06 NOTE — Progress Notes (Signed)
Subjective:    Patient ID: Tammy Choi, female    DOB: 01/28/1946, 67 y.o.   MRN: [id number]  HPI 67YO female with COPD, HTN, HL presents for acute visit c/o 2 weeks cough, shortness of breath, wheezing. Subjective fever and chills. Cough productive of purulent mucous. No chest pain. Using inhaled albuterol with minimal improvement.   Outpatient Encounter Prescriptions as of 01/06/2013  Medication Sig Dispense Refill  . albuterol (PROVENTIL HFA;VENTOLIN HFA) 108 (90 BASE) MCG/ACT inhaler Inhale 2 puffs into the lungs every 6 (six) hours as needed for wheezing.  1 Inhaler  0  . albuterol (PROVENTIL) (2.5 MG/3ML) 0.083% nebulizer solution Take 3 mLs (2.5 mg total) by nebulization every 6 (six) hours as needed for wheezing.  150 mL  1  . ALPRAZolam (XANAX) 0.5 MG tablet TAKE ONE TABLET BY MOUTH TWICE DAILY  60 tablet  1  . atorvastatin (LIPITOR) 20 MG tablet Take 1 tablet (20 mg total) by mouth daily.  30 tablet  3  . cholecalciferol (VITAMIN D) 1000 UNITS tablet Take 2,000 Units by mouth daily.      Marland Kitchen FLUoxetine (PROZAC) 40 MG capsule Take 1 capsule (40 mg total) by mouth daily.  90 capsule  2  . fluticasone (FLONASE) 50 MCG/ACT nasal spray Place 2 sprays into the nose daily.  16 g  0  . fluticasone (FLOVENT HFA) 110 MCG/ACT inhaler Inhale 2 puffs into the lungs 2 (two) times daily.  1 Inhaler  0  . HYDROcodone-acetaminophen (NORCO/VICODIN) 5-325 MG per tablet Take 1 tablet by mouth 3 (three) times daily as needed.  90 tablet  1  . lisinopril (PRINIVIL,ZESTRIL) 20 MG tablet TAKE ONE TABLET BY MOUTH EVERY DAY  30 tablet  10  . Multiple Vitamins-Minerals (MULTIVITAMIN WITH MINERALS) tablet Take 1 tablet by mouth daily.      Marland Kitchen levofloxacin (LEVAQUIN) 500 MG tablet Take 1 tablet (500 mg total) by mouth daily.  7 tablet  0  . predniSONE (STERAPRED UNI-PAK) 10 MG tablet Take 60mg  day 1 then taper by 10mg  daily  21 tablet  0   No facility-administered encounter medications on file as of 01/06/2013.    BP 136/86  Pulse 98  Temp(Src) 99.1 F (37.3 C) (Oral)  Wt 154 lb (69.854 kg)  BMI 24.87 kg/m2  SpO2 94%  Review of Systems  Constitutional: Positive for fever, chills and fatigue. Negative for appetite change and unexpected weight change.  HENT: Positive for congestion and rhinorrhea. Negative for ear pain, sore throat, trouble swallowing, neck pain, voice change and sinus pressure.   Eyes: Negative for visual disturbance.  Respiratory: Positive for cough, shortness of breath and wheezing. Negative for stridor.   Cardiovascular: Negative for chest pain, palpitations and leg swelling.  Gastrointestinal: Negative for nausea, vomiting, abdominal pain, diarrhea, constipation, blood in stool, abdominal distention and anal bleeding.  Genitourinary: Negative for dysuria and flank pain.  Musculoskeletal: Negative for myalgias, arthralgias and gait problem.  Skin: Negative for color change and rash.  Neurological: Negative for dizziness and headaches.  Hematological: Negative for adenopathy. Does not bruise/bleed easily.  Psychiatric/Behavioral: Negative for suicidal ideas, sleep disturbance and dysphoric mood. The patient is not nervous/anxious.        Objective:   Physical Exam  Constitutional: She is oriented to person, place, and time. She appears well-developed and well-nourished. No distress.  HENT:  Head: Normocephalic and atraumatic.  Right Ear: External ear normal.  Left Ear: External ear normal.  Nose: Nose normal.  Mouth/Throat: Oropharynx  is clear and moist. No oropharyngeal exudate.  Eyes: Conjunctivae are normal. Pupils are equal, round, and reactive to light. Right eye exhibits no discharge. Left eye exhibits no discharge. No scleral icterus.  Neck: Normal range of motion. Neck supple. No tracheal deviation present. No thyromegaly present.  Cardiovascular: Normal rate, regular rhythm, normal heart sounds and intact distal pulses.  Exam reveals no gallop and no friction  rub.   No murmur heard. Pulmonary/Chest: Accessory muscle usage (with minimal exertion) present. No respiratory distress. She has decreased breath sounds. She has wheezes. She has rhonchi. She has no rales. She exhibits no tenderness.  Musculoskeletal: Normal range of motion. She exhibits no edema and no tenderness.  Lymphadenopathy:    She has no cervical adenopathy.  Neurological: She is alert and oriented to person, place, and time. No cranial nerve deficit. She exhibits normal muscle tone. Coordination normal.  Skin: Skin is warm and dry. No rash noted. She is not diaphoretic. No erythema. No pallor.  Psychiatric: She has a normal mood and affect. Her behavior is normal. Judgment and thought content normal.          Assessment & Plan:

## 2013-01-08 ENCOUNTER — Other Ambulatory Visit: Payer: Self-pay | Admitting: Internal Medicine

## 2013-01-16 ENCOUNTER — Ambulatory Visit: Payer: Medicare Other | Admitting: Internal Medicine

## 2013-01-21 ENCOUNTER — Encounter: Payer: Self-pay | Admitting: Internal Medicine

## 2013-01-21 ENCOUNTER — Ambulatory Visit (INDEPENDENT_AMBULATORY_CARE_PROVIDER_SITE_OTHER): Payer: Medicare Other | Admitting: Internal Medicine

## 2013-01-21 VITALS — BP 108/72 | HR 80 | Temp 98.4°F | Wt 171.0 lb

## 2013-01-21 DIAGNOSIS — Z1211 Encounter for screening for malignant neoplasm of colon: Secondary | ICD-10-CM

## 2013-01-21 DIAGNOSIS — J449 Chronic obstructive pulmonary disease, unspecified: Secondary | ICD-10-CM

## 2013-01-21 DIAGNOSIS — M549 Dorsalgia, unspecified: Secondary | ICD-10-CM

## 2013-01-21 DIAGNOSIS — I10 Essential (primary) hypertension: Secondary | ICD-10-CM

## 2013-01-21 DIAGNOSIS — G8929 Other chronic pain: Secondary | ICD-10-CM

## 2013-01-21 DIAGNOSIS — E785 Hyperlipidemia, unspecified: Secondary | ICD-10-CM

## 2013-01-21 LAB — LIPID PANEL
Cholesterol: 134 mg/dL (ref 0–200)
HDL: 51.4 mg/dL (ref >39.00)
LDL Cholesterol: 69 mg/dL (ref 0–99)
Total CHOL/HDL Ratio: 3
Triglycerides: 69 mg/dL (ref 0.0–149.0)
VLDL: 13.8 mg/dL (ref 0.0–40.0)

## 2013-01-21 LAB — COMPREHENSIVE METABOLIC PANEL
AST: 21 U/L (ref 0–37)
Albumin: 3.4 g/dL — ABNORMAL LOW (ref 3.5–5.2)
Alkaline Phosphatase: 71 U/L (ref 39–117)
BUN: 16 mg/dL (ref 6–23)
Creatinine, Ser: 0.8 mg/dL (ref 0.4–1.2)
Glucose, Bld: 102 mg/dL — ABNORMAL HIGH (ref 70–99)
Total Bilirubin: 0.5 mg/dL (ref 0.3–1.2)

## 2013-01-21 NOTE — Progress Notes (Signed)
Subjective:    Patient ID: Tammy Choi, female    DOB: 10/10/1945, 67 y.o.   MRN: [id number]  HPI 67 year old female with history of hypertension, chronic back pain, hyperlipidemia, and COPD presents for followup. She recently had a COPD exacerbation with acute bronchitis and was treated with Levaquin and prednisone. She reports that symptoms have improved. She continues to have some intermittent cough productive of clear to white sputum mostly in the mornings. She continues to smoke. She has been using her inhaled albuterol as directed. Shortness of breath has significantly improved.  In regards to hypertension hyperlipidemia, she notes compliance with her medications. In regards to chronic back pain, she reports symptoms have been well-controlled with intermittent use of hydrocodone.  Outpatient Encounter Prescriptions as of 01/21/2013  Medication Sig Dispense Refill  . albuterol (PROVENTIL HFA;VENTOLIN HFA) 108 (90 BASE) MCG/ACT inhaler Inhale 2 puffs into the lungs every 6 (six) hours as needed for wheezing.  1 Inhaler  0  . albuterol (PROVENTIL) (2.5 MG/3ML) 0.083% nebulizer solution Take 3 mLs (2.5 mg total) by nebulization every 6 (six) hours as needed for wheezing.  150 mL  1  . ALPRAZolam (XANAX) 0.5 MG tablet TAKE ONE TABLET BY MOUTH TWICE DAILY  60 tablet  0  . atorvastatin (LIPITOR) 20 MG tablet Take 1 tablet (20 mg total) by mouth daily.  30 tablet  3  . cholecalciferol (VITAMIN D) 1000 UNITS tablet Take 2,000 Units by mouth daily.      Marland Kitchen FLUoxetine (PROZAC) 40 MG capsule Take 1 capsule (40 mg total) by mouth daily.  90 capsule  2  . fluticasone (FLONASE) 50 MCG/ACT nasal spray Place 2 sprays into the nose daily.  16 g  0  . fluticasone (FLOVENT HFA) 110 MCG/ACT inhaler Inhale 2 puffs into the lungs 2 (two) times daily.  1 Inhaler  0  . HYDROcodone-acetaminophen (NORCO/VICODIN) 5-325 MG per tablet Take 1 tablet by mouth 3 (three) times daily as needed.  90 tablet  1  .  lisinopril (PRINIVIL,ZESTRIL) 20 MG tablet TAKE ONE TABLET BY MOUTH EVERY DAY  30 tablet  10  . Multiple Vitamins-Minerals (MULTIVITAMIN WITH MINERALS) tablet Take 1 tablet by mouth daily.      . [DISCONTINUED] levofloxacin (LEVAQUIN) 500 MG tablet Take 1 tablet (500 mg total) by mouth daily.  7 tablet  0  . [DISCONTINUED] predniSONE (STERAPRED UNI-PAK) 10 MG tablet Take 60mg  day 1 then taper by 10mg  daily  21 tablet  0   No facility-administered encounter medications on file as of 01/21/2013.   BP 108/72  Pulse 80  Temp(Src) 98.4 F (36.9 C) (Oral)  Wt 171 lb (77.565 kg)  BMI 27.61 kg/m2  SpO2 94%  Review of Systems  Constitutional: Negative for fever, chills, appetite change, fatigue and unexpected weight change.  HENT: Negative for ear pain, congestion, sore throat, trouble swallowing, neck pain, voice change and sinus pressure.   Eyes: Negative for visual disturbance.  Respiratory: Positive for cough. Negative for shortness of breath, wheezing and stridor.   Cardiovascular: Negative for chest pain, palpitations and leg swelling.  Gastrointestinal: Negative for nausea, vomiting, abdominal pain, diarrhea, constipation, blood in stool, abdominal distention and anal bleeding.  Genitourinary: Negative for dysuria and flank pain.  Musculoskeletal: Positive for myalgias, back pain and arthralgias. Negative for gait problem.  Skin: Negative for color change and rash.  Neurological: Negative for dizziness and headaches.  Hematological: Negative for adenopathy. Does not bruise/bleed easily.  Psychiatric/Behavioral: Negative for suicidal ideas, sleep  disturbance and dysphoric mood. The patient is not nervous/anxious.        Objective:   Physical Exam  Constitutional: She is oriented to person, place, and time. She appears well-developed and well-nourished. No distress.  HENT:  Head: Normocephalic and atraumatic.  Right Ear: External ear normal.  Left Ear: External ear normal.  Nose:  Nose normal.  Mouth/Throat: Oropharynx is clear and moist. No oropharyngeal exudate.  Eyes: Conjunctivae are normal. Pupils are equal, round, and reactive to light. Right eye exhibits no discharge. Left eye exhibits no discharge. No scleral icterus.  Neck: Normal range of motion. Neck supple. No tracheal deviation present. No thyromegaly present.  Cardiovascular: Normal rate, regular rhythm, normal heart sounds and intact distal pulses.  Exam reveals no gallop and no friction rub.   No murmur heard. Pulmonary/Chest: Effort normal and breath sounds normal. No accessory muscle usage. Not tachypneic. No respiratory distress. She has no decreased breath sounds. She has no wheezes. She has no rhonchi. She has no rales. She exhibits no tenderness.  Musculoskeletal: Normal range of motion. She exhibits no edema and no tenderness.  Lymphadenopathy:    She has no cervical adenopathy.  Neurological: She is alert and oriented to person, place, and time. No cranial nerve deficit. She exhibits normal muscle tone. Coordination normal.  Skin: Skin is warm and dry. No rash noted. She is not diaphoretic. No erythema. No pallor.  Psychiatric: She has a normal mood and affect. Her behavior is normal. Judgment and thought content normal.          Assessment & Plan:

## 2013-01-21 NOTE — Assessment & Plan Note (Signed)
Secondary to degenerative arthritis. Symptoms are well controlled with use of intermittent hydrocodone. Will continue.

## 2013-01-21 NOTE — Assessment & Plan Note (Signed)
Will check lipids and LFTs with labs today. Continue Atorvastatin. 

## 2013-01-21 NOTE — Assessment & Plan Note (Signed)
BP Readings from Last 3 Encounters:  01/21/13 108/72  01/06/13 136/86  07/24/12 128/72   Blood pressure well-controlled on lisinopril. Will continue.

## 2013-01-21 NOTE — Assessment & Plan Note (Signed)
Symptoms have improved after recent exacerbation. Will continue inhaled bronchodilators. Will continue inhaled steroid. Encourage smoking cessation. Discussed a screening for lung cancer and new guidelines recommending CT of the chest yearly. Patient would like to hold off for now. Followup in 6 months and sooner as needed.

## 2013-02-09 ENCOUNTER — Other Ambulatory Visit: Payer: Self-pay | Admitting: Internal Medicine

## 2013-02-12 ENCOUNTER — Ambulatory Visit (INDEPENDENT_AMBULATORY_CARE_PROVIDER_SITE_OTHER): Payer: Medicare Other | Admitting: General Surgery

## 2013-02-12 ENCOUNTER — Encounter: Payer: Self-pay | Admitting: General Surgery

## 2013-02-12 VITALS — BP 128/78 | HR 80 | Resp 16 | Ht 66.0 in | Wt 171.0 lb

## 2013-02-12 DIAGNOSIS — Z1211 Encounter for screening for malignant neoplasm of colon: Secondary | ICD-10-CM

## 2013-02-12 MED ORDER — POLYETHYLENE GLYCOL 3350 17 GM/SCOOP PO POWD: ORAL | Status: DC

## 2013-02-12 NOTE — Patient Instructions (Addendum)
The patient is aware to call back for any questions or concerns.   Colonoscopy with possible biopsy/polypectomy prn: Information regarding the procedure, including its potential risks and complications (including but not limited to perforation of the bowel, which may require emergency surgery to repair, and bleeding) was verbally given to the patient. Educational information regarding lower instestinal endoscopy was given to the patient. Written instructions for how to complete the bowel prep using Miralax were provided. The importance of drinking ample fluids to avoid dehydration as a result of the prep emphasized.  Patient has been scheduled for a colonoscopy on 02-25-13 at [facility].

## 2013-02-12 NOTE — Progress Notes (Signed)
Patient ID: Tammy Choi, female   DOB: Jan 18, 1946, 67 y.o.   MRN: [id number]  No chief complaint on file.   HPI Tammy Choi is a 67 y.o. female. Patient here today to discuss having a colonoscopy referred by Dr Dan Humphreys.  Recovered from bronchitis in April 2014. Denies family history of colon polyps or colon cancer.  She has never had a colonoscopy before.   HPI  Past Medical History  Diagnosis Date  . HTN (hypertension)     Past Surgical History  Procedure Laterality Date  . Left leg surgery    . Abdominal hysterectomy      Family History  Problem Relation Age of Onset  . Kidney disease Mother   . Heart disease Father   . Breast cancer Sister     Social History History  Substance Use Topics  . Smoking status: Current Every Day Smoker -- 0.25 packs/day    Types: Cigarettes  . Smokeless tobacco: Never Used     Comment: 1-2 Cigs/day  . Alcohol Use: No    Allergies  Allergen Reactions  . Penicillins Shortness Of Breath and Swelling    Current Outpatient Prescriptions  Medication Sig Dispense Refill  . albuterol (PROVENTIL) (2.5 MG/3ML) 0.083% nebulizer solution Take 3 mLs (2.5 mg total) by nebulization every 6 (six) hours as needed for wheezing.  150 mL  1  . ALPRAZolam (XANAX) 0.5 MG tablet TAKE ONE TABLET BY MOUTH TWICE DAILY  60 tablet  0  . atorvastatin (LIPITOR) 20 MG tablet Take 1 tablet (20 mg total) by mouth daily.  30 tablet  3  . cholecalciferol (VITAMIN D) 1000 UNITS tablet Take 2,000 Units by mouth daily.      Marland Kitchen FLUoxetine (PROZAC) 40 MG capsule Take 1 capsule (40 mg total) by mouth daily.  90 capsule  2  . fluticasone (FLONASE) 50 MCG/ACT nasal spray Place 2 sprays into the nose daily.  16 g  0  . fluticasone (FLOVENT HFA) 110 MCG/ACT inhaler Inhale 2 puffs into the lungs 2 (two) times daily.  1 Inhaler  0  . HYDROcodone-acetaminophen (NORCO/VICODIN) 5-325 MG per tablet Take 1 tablet by mouth 3 (three) times daily as needed.  90 tablet  1  .  lisinopril (PRINIVIL,ZESTRIL) 20 MG tablet TAKE ONE TABLET BY MOUTH EVERY DAY  30 tablet  10  . Multiple Vitamins-Minerals (MULTIVITAMIN WITH MINERALS) tablet Take 1 tablet by mouth daily.      . polyethylene glycol powder (GLYCOLAX/MIRALAX) powder 255 grams one bottle for colonoscopy prep  255 g  0   No current facility-administered medications for this visit.    Review of Systems Review of Systems  Constitutional: Negative.   Respiratory: Negative.   Cardiovascular: Negative.     Blood pressure 128/78, pulse 80, resp. rate 16, height 5\' 6"  (1.676 m), weight 171 lb (77.565 kg).  Physical Exam Physical Exam  Constitutional: She is oriented to person, place, and time. She appears well-developed and well-nourished.  Cardiovascular: Normal rate and regular rhythm.   Pulmonary/Chest: Effort normal and breath sounds normal.  Lymphadenopathy:    She has no cervical adenopathy.  Neurological: She is alert and oriented to person, place, and time.  Skin: Skin is warm and dry.    Data Reviewed Chart  Assessment    Candidate for screening colonoscopy.    Plan    Indication for screening colonoscopy as well as the associated risks of bleeding or perforation were reviewed.    Patient has been scheduled  for a colonoscopy on 02-25-13 at [facility].    Earline Mayotte 02/13/2013, 7:29 AM

## 2013-02-13 ENCOUNTER — Encounter: Payer: Self-pay | Admitting: General Surgery

## 2013-02-13 ENCOUNTER — Other Ambulatory Visit: Payer: Self-pay | Admitting: General Surgery

## 2013-02-13 DIAGNOSIS — Z1211 Encounter for screening for malignant neoplasm of colon: Secondary | ICD-10-CM | POA: Insufficient documentation

## 2013-02-18 ENCOUNTER — Telehealth: Payer: Self-pay | Admitting: *Deleted

## 2013-02-18 NOTE — Telephone Encounter (Signed)
Message has been left for patient to call the office.  We need to reschedule colonoscopy that was scheduled for 02-25-13 at [facility] due to Dr. Rutherford Nail request (injury).

## 2013-02-19 ENCOUNTER — Telehealth: Payer: Self-pay | Admitting: *Deleted

## 2013-02-19 NOTE — Telephone Encounter (Signed)
Patient aware we need to reschedule colonoscopy. This has been moved to 04-22-13 at [facility]. She will be contacted prior to verify no medication changes.  Trish in endoscopy notified of date change.

## 2013-03-10 ENCOUNTER — Other Ambulatory Visit: Payer: Self-pay | Admitting: *Deleted

## 2013-03-10 MED ORDER — ALPRAZOLAM 0.5 MG PO TABS: ORAL_TABLET | ORAL | Status: DC

## 2013-04-08 ENCOUNTER — Telehealth: Payer: Self-pay | Admitting: *Deleted

## 2013-04-08 NOTE — Telephone Encounter (Signed)
Patient called wanting to reschedule colonoscopy from 04-22-13 to 05-05-13 at [facility]. She is going out of town. Trish in endoscopy notified of date change.

## 2013-04-10 ENCOUNTER — Other Ambulatory Visit: Payer: Self-pay | Admitting: *Deleted

## 2013-04-10 MED ORDER — ALPRAZOLAM 0.5 MG PO TABS: ORAL_TABLET | ORAL | Status: DC

## 2013-04-13 ENCOUNTER — Ambulatory Visit: Payer: Medicare Other | Admitting: General Surgery

## 2013-04-27 ENCOUNTER — Other Ambulatory Visit: Payer: Self-pay | Admitting: *Deleted

## 2013-04-27 DIAGNOSIS — F329 Major depressive disorder, single episode, unspecified: Secondary | ICD-10-CM

## 2013-04-27 MED ORDER — FLUOXETINE HCL 40 MG PO CAPS: 40.0000 mg | ORAL_CAPSULE | Freq: Every day | ORAL | Status: DC

## 2013-04-27 NOTE — Telephone Encounter (Signed)
Eprescribed.

## 2013-04-27 NOTE — Telephone Encounter (Signed)
Pt is needing to get a refill on Fluoxtine 40 mg she uses [facility] on [address]. Pt is completely out of meds.

## 2013-04-29 ENCOUNTER — Other Ambulatory Visit: Payer: Self-pay

## 2013-04-29 ENCOUNTER — Other Ambulatory Visit: Payer: Self-pay | Admitting: General Surgery

## 2013-04-29 ENCOUNTER — Encounter: Payer: Self-pay | Admitting: General Surgery

## 2013-04-29 ENCOUNTER — Ambulatory Visit (INDEPENDENT_AMBULATORY_CARE_PROVIDER_SITE_OTHER): Payer: Medicare Other | Admitting: General Surgery

## 2013-04-29 VITALS — BP 130/76 | HR 81 | Resp 14 | Ht 67.0 in | Wt 171.0 lb

## 2013-04-29 DIAGNOSIS — Z1211 Encounter for screening for malignant neoplasm of colon: Secondary | ICD-10-CM

## 2013-04-29 DIAGNOSIS — N63 Unspecified lump in unspecified breast: Secondary | ICD-10-CM

## 2013-04-29 DIAGNOSIS — N6320 Unspecified lump in the left breast, unspecified quadrant: Secondary | ICD-10-CM

## 2013-04-29 NOTE — Progress Notes (Signed)
Patient ID: Tammy Choi, female   DOB: Feb 13, 1946, 67 y.o.   MRN: [id number]  Chief Complaint  Patient presents with  . Follow-up    breast check and pre op colonoscopy    HPI Tammy Choi is a 67 y.o. female.  Patient here today for 6 month follow up left breast ultrasound and preop colonoscopy scheduled for 05-05-13. She denies any breast or GI symptoms.  She does have a knot on her left upper arm that has been there for at least 6 months but for about 2-3 months she thinks it is "growing". She has cut down smoking. The patient reports that she is smoking about 2 cigarettes per day, a marked improvement from past exams.  The patient's colonoscopy had been postponed due to my surgery in May 2014. She reports no GI symptoms. HPI  Past Medical History  Diagnosis Date  . HTN (hypertension)     Past Surgical History  Procedure Laterality Date  . Left leg surgery    . Abdominal hysterectomy      Family History  Problem Relation Age of Onset  . Kidney disease Mother   . Heart disease Father   . Breast cancer Sister     Social History History  Substance Use Topics  . Smoking status: Current Every Day Smoker -- 0.25 packs/day    Types: Cigarettes  . Smokeless tobacco: Never Used     Comment: 1-2 Cigs/day  . Alcohol Use: No    Allergies  Allergen Reactions  . Penicillins Shortness Of Breath and Swelling    Current Outpatient Prescriptions  Medication Sig Dispense Refill  . albuterol (PROVENTIL) (2.5 MG/3ML) 0.083% nebulizer solution Take 3 mLs (2.5 mg total) by nebulization every 6 (six) hours as needed for wheezing.  150 mL  1  . ALPRAZolam (XANAX) 0.5 MG tablet TAKE ONE TABLET BY MOUTH TWICE DAILY  60 tablet  0  . atorvastatin (LIPITOR) 20 MG tablet Take 1 tablet (20 mg total) by mouth daily.  30 tablet  3  . cholecalciferol (VITAMIN D) 1000 UNITS tablet Take 2,000 Units by mouth daily.      Marland Kitchen FLUoxetine (PROZAC) 40 MG capsule Take 1 capsule (40 mg total) by  mouth daily.  90 capsule  0  . fluticasone (FLONASE) 50 MCG/ACT nasal spray Place 2 sprays into the nose daily.  16 g  0  . fluticasone (FLOVENT HFA) 110 MCG/ACT inhaler Inhale 2 puffs into the lungs 2 (two) times daily.  1 Inhaler  0  . HYDROcodone-acetaminophen (NORCO/VICODIN) 5-325 MG per tablet Take 1 tablet by mouth 3 (three) times daily as needed.  90 tablet  1  . lisinopril (PRINIVIL,ZESTRIL) 20 MG tablet TAKE ONE TABLET BY MOUTH EVERY DAY  30 tablet  10  . Multiple Vitamins-Minerals (MULTIVITAMIN WITH MINERALS) tablet Take 1 tablet by mouth daily.      . polyethylene glycol powder (GLYCOLAX/MIRALAX) powder 255 grams one bottle for colonoscopy prep  255 g  0   No current facility-administered medications for this visit.    Review of Systems Review of Systems  Constitutional: Negative.   Respiratory: Negative.   Cardiovascular: Negative.   Gastrointestinal: Negative.     Blood pressure 130/76, pulse 81, resp. rate 14, height 5\' 7"  (1.702 m), weight 171 lb (77.565 kg).  Physical Exam Physical Exam  Constitutional: She is oriented to person, place, and time. She appears well-developed and well-nourished.  Cardiovascular: Normal rate and regular rhythm.   Pulmonary/Chest: Effort normal and  breath sounds normal. Right breast exhibits no inverted nipple, no mass, no nipple discharge, no skin change and no tenderness. Left breast exhibits no inverted nipple, no mass, no nipple discharge, no skin change and no tenderness.  Lymphadenopathy:    She has no cervical adenopathy.    She has no axillary adenopathy.  The patient shows a soft subcutaneous fullness in the posterior aspect of the right axilla.  Lymphadenopathy is not identified.  Neurological: She is alert and oriented to person, place, and time.  Skin: Skin is warm and dry.   1 cm nodule on left arm extensor surface upper arm consistent with a lipoma.   Data Reviewed  Original mammograms dated 09/05/2012 showed multiple  breast nodules consistent with past exams. A new area of asymmetry in the right breast prompted bilateral ultrasound.  Focal spot compression views of the right breast showed clearing of the density, but a persistent nodular area at the 11:30 o'clock position 2 cm from the nipple. FNA sampling of this lesion dated 10/09/2012 was negative for malignant cells.  Ultrasound of the right axillary fullness showed a isoechoic/slightly hypoechoic smoothly marginated nodule measuring 1.37 x 1.48 x 2.65 cm in the subcutaneous fat. The acoustic enhancement was appreciated. This is consistent with a lipoma.  Ultrasound examination of the left breast in the 11:00 position showed 2 side-by-side nodules measuring in aggregate 0.5 x 0.8 x 0.82 cm. This is minimally changed from her January 2014 exam.  The cytology report did not show evidence of malignancy, but it did not clear explanation for the persistent nodularity. These are not simple cysts in the left breast.  The patient was amenable to a vacuum assisted biopsy. A total of 10 cc of 0.5 sessile can with 0.25% Marcaine with 1 200,000 of epinephrine was utilized well tolerated. Chlorpropamide applied to the skin. A 14-gauge Finesse device was passed through the lesion under ultrasound guidance and the area completely removed. A postbiopsy clip was placed. The procedure was well tolerated. Skin defect was closed with benzoin and Steri-Strips followed by Telfa and Tegaderm dressing.  Written instructions were provided for wound care. Assessment    Left breast nodular density    Plan    The patient will be contacted when pathology results are available. She'll return in one week for nursing check of the wound. Assuming benign results we'll plan for followup examination with bilateral diagnostic mammograms in 6 months.    We will proceed with colonoscopy that is scheduled for 05-05-13 at [facility].   Tammy Choi 04/29/2013, 5:20 PM

## 2013-04-29 NOTE — Patient Instructions (Addendum)
Continue self breast exams. Call office for any new breast issues or concerns. Colonoscopy with possible biopsy/polypectomy prn: Information regarding the procedure, including its potential risks and complications (including but not limited to perforation of the bowel, which may require emergency surgery to repair, and bleeding) was verbally given to the patient. Educational information regarding lower instestinal endoscopy was given to the patient. Written instructions for how to complete the bowel prep using Miralax were provided. The importance of drinking ample fluids to avoid dehydration as a result of the prep emphasized.     CARE AFTER BREAST BIOPSY  1. Leave the dressing on that your doctor applied after surgery. It is waterproof. You may bathe, shower and/or swim. The dressing will probably remain intact until your return office visit. If the dressing comes off, you will see small strips of tape against your skin on the incision. Do not remove these strips.  2. You may want to use a gauze,cloth or similar protection in your bra to prevent rubbing against your dressing and incision. This is not necessary, but you may feel more comfortable doing so.  3. It is recommended that you wear a bra day and night to give support to the breast. This will prevent the weight of the breast from pulling on the incision.  4. Your breast will feel hard and lumpy under the incision. Do not be alarmed. This is the underlying stitching of tissue. Softening of this tissue will occur in time.  5. Make sure you call the office and schedule an appointment in one week after your surgery. The office phone number is [phone]. The [facility] may have already done this for you.  6. You will notice about a week after your office visit that the strips of the tape on your incision will begin to loosen. These may then be removed.  7. Report to your doctor any of the following:  * Severe pain not  relieved by your pain medication  *Redness of the incision  * Drainage from the incision  *Fever greater than 101 degrees

## 2013-04-30 ENCOUNTER — Telehealth: Payer: Self-pay | Admitting: General Surgery

## 2013-04-30 HISTORY — PX: BREAST BIOPSY: SHX20

## 2013-04-30 LAB — PATHOLOGY

## 2013-04-30 NOTE — Telephone Encounter (Signed)
Patient notified biopsy results benign. Doing well.

## 2013-05-05 ENCOUNTER — Ambulatory Visit: Payer: Self-pay | Admitting: General Surgery

## 2013-05-05 DIAGNOSIS — D128 Benign neoplasm of rectum: Secondary | ICD-10-CM

## 2013-05-05 DIAGNOSIS — D129 Benign neoplasm of anus and anal canal: Secondary | ICD-10-CM

## 2013-05-06 ENCOUNTER — Encounter: Payer: Self-pay | Admitting: General Surgery

## 2013-05-07 ENCOUNTER — Telehealth: Payer: Self-pay | Admitting: *Deleted

## 2013-05-07 LAB — PATHOLOGY REPORT

## 2013-05-07 NOTE — Telephone Encounter (Signed)
Notified patient as instructed, patient pleased. Discussed follow-up appointments, patient agrees  

## 2013-05-07 NOTE — Telephone Encounter (Signed)
Message copied by Currie Paris on Thu May 07, 2013  5:03 PM ------      Message from: [place], [place] W      Created: Thu May 07, 2013  3:48 PM       Please notify the patient that the polyp removed was benign. She should be placed in recalls for a repeat exam in five years. Thanks.      ----- Message -----         From: Jena Gauss, CMA         Sent: 05/06/2013   3:39 PM           To: Earline Mayotte, MD                   ------

## 2013-05-08 ENCOUNTER — Encounter: Payer: Self-pay | Admitting: General Surgery

## 2013-05-15 ENCOUNTER — Other Ambulatory Visit: Payer: Self-pay | Admitting: *Deleted

## 2013-05-18 MED ORDER — ALPRAZOLAM 0.5 MG PO TABS: ORAL_TABLET | ORAL | Status: DC

## 2013-05-20 ENCOUNTER — Encounter: Payer: Self-pay | Admitting: General Surgery

## 2013-05-22 ENCOUNTER — Other Ambulatory Visit: Payer: Self-pay | Admitting: *Deleted

## 2013-05-22 MED ORDER — HYDROCODONE-ACETAMINOPHEN 5-325 MG PO TABS: 1.0000 | ORAL_TABLET | Freq: Three times a day (TID) | ORAL | Status: DC | PRN

## 2013-05-22 NOTE — Telephone Encounter (Signed)
Needs to sign controlled drug contract and give UDS, then we can give refill on hydrocodone.

## 2013-05-28 MED ORDER — HYDROCODONE-ACETAMINOPHEN 5-325 MG PO TABS: 1.0000 | ORAL_TABLET | Freq: Three times a day (TID) | ORAL | Status: DC | PRN

## 2013-05-28 NOTE — Addendum Note (Signed)
Addended by: Theola Sequin on: 05/28/2013 09:13 AM   Modules accepted: Orders

## 2013-05-28 NOTE — Telephone Encounter (Signed)
Patient aware she must come in to sign contract.

## 2013-06-12 ENCOUNTER — Other Ambulatory Visit: Payer: Self-pay | Admitting: *Deleted

## 2013-06-12 MED ORDER — ALPRAZOLAM 0.5 MG PO TABS: ORAL_TABLET | ORAL | Status: DC

## 2013-07-02 ENCOUNTER — Other Ambulatory Visit: Payer: Self-pay | Admitting: *Deleted

## 2013-07-02 MED ORDER — ALPRAZOLAM 0.5 MG PO TABS: ORAL_TABLET | ORAL | Status: DC

## 2013-07-23 ENCOUNTER — Other Ambulatory Visit: Payer: Self-pay | Admitting: Internal Medicine

## 2013-07-24 ENCOUNTER — Encounter: Payer: Self-pay | Admitting: *Deleted

## 2013-07-24 ENCOUNTER — Other Ambulatory Visit: Payer: Self-pay | Admitting: *Deleted

## 2013-07-24 NOTE — Telephone Encounter (Signed)
Eprescribed.

## 2013-07-27 ENCOUNTER — Encounter (INDEPENDENT_AMBULATORY_CARE_PROVIDER_SITE_OTHER): Payer: Self-pay

## 2013-07-27 ENCOUNTER — Encounter: Payer: Self-pay | Admitting: Internal Medicine

## 2013-07-27 ENCOUNTER — Ambulatory Visit (INDEPENDENT_AMBULATORY_CARE_PROVIDER_SITE_OTHER): Payer: Medicare Other | Admitting: Internal Medicine

## 2013-07-27 VITALS — BP 110/80 | HR 80 | Temp 98.6°F | Ht 64.5 in | Wt 172.0 lb

## 2013-07-27 DIAGNOSIS — F17200 Nicotine dependence, unspecified, uncomplicated: Secondary | ICD-10-CM

## 2013-07-27 DIAGNOSIS — E785 Hyperlipidemia, unspecified: Secondary | ICD-10-CM

## 2013-07-27 DIAGNOSIS — I10 Essential (primary) hypertension: Secondary | ICD-10-CM

## 2013-07-27 DIAGNOSIS — Z0000 Encounter for general adult medical examination without abnormal findings: Secondary | ICD-10-CM

## 2013-07-27 LAB — LIPID PANEL
HDL: 59.9 mg/dL (ref >39.00)
LDL Cholesterol: 70 mg/dL (ref 0–99)
Total CHOL/HDL Ratio: 3
Triglycerides: 104 mg/dL (ref 0.0–149.0)

## 2013-07-27 LAB — COMPREHENSIVE METABOLIC PANEL
ALT: 18 U/L (ref 0–35)
AST: 19 U/L (ref 0–37)
Albumin: 3.9 g/dL (ref 3.5–5.2)
Alkaline Phosphatase: 78 U/L (ref 39–117)
BUN: 21 mg/dL (ref 6–23)
Potassium: 5.2 mEq/L — ABNORMAL HIGH (ref 3.5–5.1)

## 2013-07-27 LAB — MICROALBUMIN / CREATININE URINE RATIO
Microalb Creat Ratio: 0.9 mg/g (ref 0.0–30.0)
Microalb, Ur: 0.8 mg/dL (ref 0.0–1.9)

## 2013-07-27 MED ORDER — LISINOPRIL 20 MG PO TABS: ORAL_TABLET | ORAL | Status: DC

## 2013-07-27 NOTE — Assessment & Plan Note (Signed)
Encouraged smoking cessation. Recommended yearly Chest CT for screening for lung cancer. Order placed for this.

## 2013-07-27 NOTE — Progress Notes (Signed)
Subjective:    Patient ID: Tammy Choi, female    DOB: 1946/07/10, 67 y.o.   MRN: [id number]  HPI The patient is here for annual Medicare wellness examination and management of other chronic and acute problems.   The risk factors are reflected in the social history.  The roster of all physicians providing medical care to patient - is listed in the Snapshot section of the chart.  Activities of daily living:  The patient is 100% independent in all ADLs: dressing, toileting, feeding as well as independent mobility  Home safety : The patient has smoke detectors in the home. They wear seatbelts.  There are no firearms at home. There is no violence in the home.   There is no risks for hepatitis, STDs or HIV. There is no history of blood transfusion. They have no travel history to infectious disease endemic areas of the world.  The patient has not seen their dentist in the last six month. (Has dentures, Dr. Audley Hose) They have not seen their eye doctor in the last year. Will schedule appointment. Dermatologist - Dr. Jarold Motto. Will schedule. No issues with hearing. They have deferred audiologic testing in the last year.   They do not  have excessive sun exposure. Discussed the need for sun protection: hats, long sleeves and use of sunscreen if there is significant sun exposure.   Diet: the importance of a healthy diet is discussed. They do have a healthy diet.  The benefits of regular aerobic exercise were discussed. She walks every day if possible.  Depression screen: there are no signs or vegative symptoms of depression- irritability, change in appetite, anhedonia, sadness/tearfullness.  Cognitive assessment: the patient manages all their financial and personal affairs and is actively engaged. They could relate day,date,year and events.  The following portions of the patient's history were reviewed and updated as appropriate: allergies, current medications, past family history,  past medical history,  past surgical history, past social history  and problem list.  Visual acuity was not assessed per patient preference since she has regular follow up with her ophthalmologist. Hearing and body mass index were assessed and reviewed.   During the course of the visit the patient was educated and counseled about appropriate screening and preventive services including : fall prevention , diabetes screening, nutrition counseling, colorectal cancer screening, and recommended immunizations.    Outpatient Encounter Prescriptions as of 07/27/2013  Medication Sig Dispense Refill  . ALPRAZolam (XANAX) 0.5 MG tablet TAKE ONE TABLET BY MOUTH TWICE DAILY  60 tablet  0  . atorvastatin (LIPITOR) 20 MG tablet TAKE ONE TABLET BY MOUTH EVERY DAY  30 tablet  1  . cholecalciferol (VITAMIN D) 1000 UNITS tablet Take 2,000 Units by mouth daily.      Marland Kitchen FLUoxetine (PROZAC) 40 MG capsule Take 1 capsule (40 mg total) by mouth daily.  90 capsule  0  . lisinopril (PRINIVIL,ZESTRIL) 20 MG tablet TAKE ONE TABLET BY MOUTH EVERY DAY  90 tablet  4  . Multiple Vitamins-Minerals (MULTIVITAMIN WITH MINERALS) tablet Take 1 tablet by mouth daily.      Marland Kitchen albuterol (PROVENTIL) (2.5 MG/3ML) 0.083% nebulizer solution Take 3 mLs (2.5 mg total) by nebulization every 6 (six) hours as needed for wheezing.  150 mL  1  . fluticasone (FLONASE) 50 MCG/ACT nasal spray Place 2 sprays into the nose daily.  16 g  0  . fluticasone (FLOVENT HFA) 110 MCG/ACT inhaler Inhale 2 puffs into the lungs 2 (two) times daily.  1 Inhaler  0   No facility-administered encounter medications on file as of 07/27/2013.   BP 110/80  Pulse 80  Temp(Src) 98.6 F (37 C) (Oral)  Ht 5' 4.5" (1.638 m)  Wt 172 lb (78.019 kg)  BMI 29.08 kg/m2  SpO2 96%   Review of Systems  Constitutional: Negative for fever, chills, appetite change, fatigue and unexpected weight change.  HENT: Negative for congestion, ear pain, sinus pressure, sore throat,  trouble swallowing and voice change.   Eyes: Negative for visual disturbance.  Respiratory: Negative for cough, shortness of breath, wheezing and stridor.   Cardiovascular: Negative for chest pain, palpitations and leg swelling.  Gastrointestinal: Negative for nausea, vomiting, abdominal pain, diarrhea, constipation, blood in stool, abdominal distention and anal bleeding.  Genitourinary: Negative for dysuria and flank pain.  Musculoskeletal: Negative for arthralgias, gait problem, myalgias and neck pain.  Skin: Negative for color change and rash.  Neurological: Negative for dizziness and headaches.  Hematological: Negative for adenopathy. Does not bruise/bleed easily.  Psychiatric/Behavioral: Negative for suicidal ideas, sleep disturbance and dysphoric mood. The patient is not nervous/anxious.        Objective:   Physical Exam  Constitutional: She is oriented to person, place, and time. She appears well-developed and well-nourished. No distress.  HENT:  Head: Normocephalic and atraumatic.  Right Ear: External ear normal.  Left Ear: External ear normal.  Nose: Nose normal.  Mouth/Throat: Oropharynx is clear and moist. No oropharyngeal exudate.  Eyes: Conjunctivae are normal. Pupils are equal, round, and reactive to light. Right eye exhibits no discharge. Left eye exhibits no discharge. No scleral icterus.  Neck: Normal range of motion. Neck supple. No tracheal deviation present. No thyromegaly present.  Cardiovascular: Normal rate, regular rhythm, normal heart sounds and intact distal pulses.  Exam reveals no gallop and no friction rub.   No murmur heard. Pulmonary/Chest: Effort normal and breath sounds normal. No accessory muscle usage. Not tachypneic. No respiratory distress. She has no decreased breath sounds. She has no wheezes. She has no rhonchi. She has no rales. She exhibits no tenderness.  Abdominal: Soft. Bowel sounds are normal. She exhibits no distension and no mass. There is  no tenderness. There is no rebound and no guarding.  Musculoskeletal: Normal range of motion. She exhibits no edema and no tenderness.  Lymphadenopathy:    She has no cervical adenopathy.  Neurological: She is alert and oriented to person, place, and time. No cranial nerve deficit. She exhibits normal muscle tone. Coordination normal.  Skin: Skin is warm and dry. No rash noted. She is not diaphoretic. No erythema. No pallor.  Psychiatric: She has a normal mood and affect. Her behavior is normal. Judgment and thought content normal.          Assessment & Plan:

## 2013-07-27 NOTE — Assessment & Plan Note (Signed)
General medical exam normal today. Breast exam deferred as recently normal when performed by general surgeon in follow up to biopsy. Pap and pelvic exam deferred because of patient's age and status post hysterectomy. Health maintenance is up to date. Will check basic labs today including CMP, lipids, CBC. Appropriate screening performed. Followup 6 months or sooner as needed.

## 2013-07-31 ENCOUNTER — Other Ambulatory Visit: Payer: Self-pay | Admitting: Internal Medicine

## 2013-08-19 ENCOUNTER — Other Ambulatory Visit: Payer: Self-pay | Admitting: Internal Medicine

## 2013-09-02 ENCOUNTER — Other Ambulatory Visit: Payer: Self-pay | Admitting: Internal Medicine

## 2013-09-02 NOTE — Telephone Encounter (Signed)
Refill? Last OV 07/27/13

## 2013-09-15 ENCOUNTER — Other Ambulatory Visit: Payer: Self-pay | Admitting: Internal Medicine

## 2013-10-08 ENCOUNTER — Encounter: Payer: Self-pay | Admitting: Internal Medicine

## 2013-10-08 ENCOUNTER — Other Ambulatory Visit: Payer: Self-pay | Admitting: Internal Medicine

## 2013-10-08 NOTE — Telephone Encounter (Signed)
Ok refill? 

## 2013-10-09 ENCOUNTER — Other Ambulatory Visit: Payer: Self-pay | Admitting: Internal Medicine

## 2013-10-28 ENCOUNTER — Ambulatory Visit: Payer: Medicare Other | Admitting: General Surgery

## 2013-11-12 ENCOUNTER — Encounter: Payer: Self-pay | Admitting: *Deleted

## 2013-11-20 ENCOUNTER — Other Ambulatory Visit: Payer: Self-pay | Admitting: Internal Medicine

## 2013-11-20 NOTE — Telephone Encounter (Signed)
Okay to refill? 

## 2013-12-18 ENCOUNTER — Other Ambulatory Visit: Payer: Self-pay | Admitting: Internal Medicine

## 2013-12-18 NOTE — Telephone Encounter (Signed)
Last visit 07/27/13, ok refill?

## 2013-12-22 ENCOUNTER — Other Ambulatory Visit: Payer: Self-pay | Admitting: Internal Medicine

## 2013-12-23 NOTE — Telephone Encounter (Signed)
Ok to fill 

## 2014-02-03 ENCOUNTER — Ambulatory Visit: Payer: Medicare Other | Admitting: Internal Medicine

## 2014-02-04 ENCOUNTER — Ambulatory Visit: Payer: Medicare Other | Admitting: Internal Medicine

## 2014-02-08 ENCOUNTER — Ambulatory Visit: Payer: Medicare Other | Admitting: Internal Medicine

## 2014-02-24 ENCOUNTER — Ambulatory Visit (INDEPENDENT_AMBULATORY_CARE_PROVIDER_SITE_OTHER): Payer: Medicare Other | Admitting: Internal Medicine

## 2014-02-24 ENCOUNTER — Encounter (INDEPENDENT_AMBULATORY_CARE_PROVIDER_SITE_OTHER): Payer: Self-pay

## 2014-02-24 ENCOUNTER — Encounter: Payer: Self-pay | Admitting: Internal Medicine

## 2014-02-24 VITALS — BP 100/66 | HR 90 | Temp 99.1°F | Ht 64.5 in | Wt 173.5 lb

## 2014-02-24 DIAGNOSIS — Z1239 Encounter for other screening for malignant neoplasm of breast: Secondary | ICD-10-CM

## 2014-02-24 DIAGNOSIS — F32A Depression, unspecified: Secondary | ICD-10-CM

## 2014-02-24 DIAGNOSIS — F3289 Other specified depressive episodes: Secondary | ICD-10-CM

## 2014-02-24 DIAGNOSIS — E785 Hyperlipidemia, unspecified: Secondary | ICD-10-CM

## 2014-02-24 DIAGNOSIS — I10 Essential (primary) hypertension: Secondary | ICD-10-CM

## 2014-02-24 DIAGNOSIS — F329 Major depressive disorder, single episode, unspecified: Secondary | ICD-10-CM

## 2014-02-24 MED ORDER — FLUOXETINE HCL 20 MG PO TABS: 60.0000 mg | ORAL_TABLET | Freq: Every day | ORAL | Status: DC

## 2014-02-24 NOTE — Assessment & Plan Note (Signed)
Will check lipids and LFTs with labs today. Continue Atorvastatin. 

## 2014-02-24 NOTE — Assessment & Plan Note (Signed)
Recent worsening symptoms of depression. Will increase dose of Fluoxetine to 60mg  daily. Plan follow up in 4 weeks or sooner as needed.

## 2014-02-24 NOTE — Progress Notes (Signed)
 Subjective:    Patient ID: Tammy Choi, female    DOB: 08/16/1946, 67 y.o.   MRN: [id number]  HPI 67YO female presents for follow up. Had vomiting and diarrhea two days ago. Now resolved. Has not taken BP meds in 2 days. Feeling better today, just tired. Tolerating soups. No abdominal pain.   Feeling more depressed recently. Irritable. No major changes at home. No suicidal ideation.  HTN - Compliant with medications. No chest pain, palpitations, dyspnea. Does not generally check BP at home.  Review of Systems  Constitutional: Negative for fever, chills, appetite change, fatigue and unexpected weight change.  HENT: Negative for congestion, ear pain, sinus pressure, sore throat, trouble swallowing and voice change.   Eyes: Negative for visual disturbance.  Respiratory: Negative for cough, shortness of breath, wheezing and stridor.   Cardiovascular: Negative for chest pain, palpitations and leg swelling.  Gastrointestinal: Negative for nausea, vomiting, abdominal pain, diarrhea, constipation, blood in stool, abdominal distention and anal bleeding.  Genitourinary: Negative for dysuria and flank pain.  Musculoskeletal: Negative for arthralgias, gait problem, myalgias and neck pain.  Skin: Negative for color change and rash.  Neurological: Negative for dizziness and headaches.  Hematological: Negative for adenopathy. Does not bruise/bleed easily.  Psychiatric/Behavioral: Positive for dysphoric mood. Negative for suicidal ideas and sleep disturbance. The patient is not nervous/anxious.        Objective:    BP 100/66  Pulse 90  Temp(Src) 99.1 F (37.3 C) (Oral)  Ht 5' 4.5" (1.638 m)  Wt 173 lb 8 oz (78.699 kg)  BMI 29.33 kg/m2  SpO2 95% Physical Exam  Constitutional: She is oriented to person, place, and time. She appears well-developed and well-nourished. No distress.  HENT:  Head: Normocephalic and atraumatic.  Right Ear: External ear normal.  Left Ear: External ear  normal.  Nose: Nose normal.  Mouth/Throat: Oropharynx is clear and moist. No oropharyngeal exudate.  Eyes: Conjunctivae are normal. Pupils are equal, round, and reactive to light. Right eye exhibits no discharge. Left eye exhibits no discharge. No scleral icterus.  Neck: Normal range of motion. Neck supple. No tracheal deviation present. No thyromegaly present.  Cardiovascular: Normal rate, regular rhythm, normal heart sounds and intact distal pulses.  Exam reveals no gallop and no friction rub.   No murmur heard. Pulmonary/Chest: Effort normal and breath sounds normal. No accessory muscle usage. Not tachypneic. No respiratory distress. She has no decreased breath sounds. She has no wheezes. She has no rhonchi. She has no rales. She exhibits no tenderness.  Musculoskeletal: Normal range of motion. She exhibits no edema and no tenderness.  Lymphadenopathy:    She has no cervical adenopathy.  Neurological: She is alert and oriented to person, place, and time. No cranial nerve deficit. She exhibits normal muscle tone. Coordination normal.  Skin: Skin is warm and dry. No rash noted. She is not diaphoretic. No erythema. No pallor.  Psychiatric: Her behavior is normal. Judgment and thought content normal. She exhibits a depressed mood.          Assessment & Plan:   Problem List Items Addressed This Visit     Unprioritized   Depression     Recent worsening symptoms of depression. Will increase dose of Fluoxetine to 60mg daily. Plan follow up in 4 weeks or sooner as needed.    Relevant Medications      FLUoxetine (PROZAC) tablet   Hyperlipidemia     Will check lipids and LFTs with labs today. Continue Atorvastatin.      Relevant Orders      Lipid Profile   Hypertension - Primary      BP Readings from Last 3 Encounters:  02/24/14 100/66  07/27/13 110/80  04/29/13 130/76   BP well controlled on current medication. Will check renal function with labs.    Relevant Orders      Comp Met  (CMET)    Other Visit Diagnoses   Screening for breast cancer        Relevant Orders       MM Digital Diagnostic Bilat        Return in about 4 weeks (around 03/24/2014) for Recheck.

## 2014-02-24 NOTE — Progress Notes (Signed)
Pre visit review using our clinic review tool, if applicable. No additional management support is needed unless otherwise documented below in the visit note. 

## 2014-02-24 NOTE — Assessment & Plan Note (Signed)
BP Readings from Last 3 Encounters:  02/24/14 100/66  07/27/13 110/80  04/29/13 130/76   BP well controlled on current medication. Will check renal function with labs.

## 2014-02-24 NOTE — Patient Instructions (Signed)
Increase Fluoxetine to 60mg  daily.  Follow up in 4 weeks or sooner as needed.

## 2014-02-25 ENCOUNTER — Telehealth: Payer: Self-pay | Admitting: Internal Medicine

## 2014-02-25 ENCOUNTER — Encounter: Payer: Self-pay | Admitting: *Deleted

## 2014-02-25 LAB — COMPREHENSIVE METABOLIC PANEL
ALT: 21 U/L (ref 0–35)
AST: 25 U/L (ref 0–37)
Albumin: 3.7 g/dL (ref 3.5–5.2)
Alkaline Phosphatase: 71 U/L (ref 39–117)
BUN: 20 mg/dL (ref 6–23)
CALCIUM: 9.2 mg/dL (ref 8.4–10.5)
CHLORIDE: 106 meq/L (ref 96–112)
CO2: 29 meq/L (ref 19–32)
Creatinine, Ser: 0.8 mg/dL (ref 0.4–1.2)
GFR: 72.53 mL/min (ref >60.00)
Glucose, Bld: 91 mg/dL (ref 70–99)
POTASSIUM: 5.1 meq/L (ref 3.5–5.1)
SODIUM: 142 meq/L (ref 135–145)
TOTAL PROTEIN: 6.5 g/dL (ref 6.0–8.3)
Total Bilirubin: 0.3 mg/dL (ref 0.2–1.2)

## 2014-02-25 LAB — LIPID PANEL
Cholesterol: 126 mg/dL (ref 0–200)
HDL: 53.8 mg/dL (ref >39.00)
LDL Cholesterol: 54 mg/dL (ref 0–99)
Total CHOL/HDL Ratio: 2
Triglycerides: 90 mg/dL (ref 0.0–149.0)
VLDL: 18 mg/dL (ref 0.0–40.0)

## 2014-02-25 NOTE — Telephone Encounter (Signed)
Relevant patient education mailed to patient.  

## 2014-03-26 ENCOUNTER — Other Ambulatory Visit: Payer: Self-pay | Admitting: Internal Medicine

## 2014-03-26 NOTE — Telephone Encounter (Signed)
Ok to fill 

## 2014-03-30 ENCOUNTER — Encounter: Payer: Self-pay | Admitting: Internal Medicine

## 2014-03-30 ENCOUNTER — Ambulatory Visit (INDEPENDENT_AMBULATORY_CARE_PROVIDER_SITE_OTHER): Payer: Medicare Other | Admitting: Internal Medicine

## 2014-03-30 VITALS — BP 118/78 | HR 83 | Temp 98.5°F | Ht 64.5 in | Wt 174.5 lb

## 2014-03-30 DIAGNOSIS — F329 Major depressive disorder, single episode, unspecified: Secondary | ICD-10-CM

## 2014-03-30 DIAGNOSIS — R0989 Other specified symptoms and signs involving the circulatory and respiratory systems: Secondary | ICD-10-CM

## 2014-03-30 DIAGNOSIS — R0609 Other forms of dyspnea: Secondary | ICD-10-CM | POA: Insufficient documentation

## 2014-03-30 DIAGNOSIS — Z23 Encounter for immunization: Secondary | ICD-10-CM

## 2014-03-30 DIAGNOSIS — F3289 Other specified depressive episodes: Secondary | ICD-10-CM

## 2014-03-30 DIAGNOSIS — F32A Depression, unspecified: Secondary | ICD-10-CM

## 2014-03-30 MED ORDER — ZOSTER VACCINE LIVE 19400 UNT/0.65ML ~~LOC~~ SOLR: 0.6500 mL | Freq: Once | SUBCUTANEOUS | Status: DC

## 2014-03-30 MED ORDER — ATORVASTATIN CALCIUM 20 MG PO TABS: 20.0000 mg | ORAL_TABLET | Freq: Every day | ORAL | Status: DC

## 2014-03-30 MED ORDER — ALPRAZOLAM 0.5 MG PO TABS: 0.5000 mg | ORAL_TABLET | Freq: Two times a day (BID) | ORAL | Status: DC | PRN

## 2014-03-30 NOTE — Assessment & Plan Note (Signed)
Symptoms of dyspnea on exertion. EKG today stable compared to previous. Recommended cardiology evaluation for stress test, however pt declines for now. She will call if she would like to proceed with this.

## 2014-03-30 NOTE — Progress Notes (Signed)
Subjective:    Patient ID: Tammy Choi, female    DOB: 19-Oct-1945, 68 y.o.   MRN: [id number]  HPI 68YO female presents for follow up.  Depression - Symptoms are much improved with higher dose of Fluoxetine at 60mg  daily.  Dyspnea with exertion - Noted by patient over last several weeks with shortness of breath and "skipped heart beats" with exertion such as walking or lifting objects. No chest pain. No diaphoresis or nausea.  Review of Systems  Constitutional: Negative for fever, chills, appetite change, fatigue and unexpected weight change.  Eyes: Negative for visual disturbance.  Respiratory: Positive for shortness of breath. Negative for wheezing.   Cardiovascular: Positive for palpitations. Negative for chest pain and leg swelling.  Gastrointestinal: Negative for abdominal pain.  Skin: Negative for color change and rash.  Hematological: Negative for adenopathy. Does not bruise/bleed easily.  Psychiatric/Behavioral: Positive for dysphoric mood (improved since last visit). Negative for suicidal ideas and sleep disturbance. The patient is not nervous/anxious.        Objective:    BP 118/78  Pulse 83  Temp(Src) 98.5 F (36.9 C) (Oral)  Ht 5' 4.5" (1.638 m)  Wt 174 lb 8 oz (79.153 kg)  BMI 29.50 kg/m2  SpO2 94% Physical Exam  Constitutional: She is oriented to person, place, and time. She appears well-developed and well-nourished. No distress.  HENT:  Head: Normocephalic and atraumatic.  Right Ear: External ear normal.  Left Ear: External ear normal.  Nose: Nose normal.  Mouth/Throat: Oropharynx is clear and moist. No oropharyngeal exudate.  Eyes: Conjunctivae are normal. Pupils are equal, round, and reactive to light. Right eye exhibits no discharge. Left eye exhibits no discharge. No scleral icterus.  Neck: Normal range of motion. Neck supple. No tracheal deviation present. No thyromegaly present.  Cardiovascular: Normal rate, regular rhythm, normal heart sounds  and intact distal pulses.  Exam reveals no gallop and no friction rub.   No murmur heard. Pulmonary/Chest: Effort normal and breath sounds normal. No accessory muscle usage. Not tachypneic. No respiratory distress. She has no decreased breath sounds. She has no wheezes. She has no rhonchi. She has no rales. She exhibits no tenderness.  Musculoskeletal: Normal range of motion. She exhibits no edema and no tenderness.  Lymphadenopathy:    She has no cervical adenopathy.  Neurological: She is alert and oriented to person, place, and time. No cranial nerve deficit. She exhibits normal muscle tone. Coordination normal.  Skin: Skin is warm and dry. No rash noted. She is not diaphoretic. No erythema. No pallor.  Psychiatric: She has a normal mood and affect. Her speech is normal and behavior is normal. Judgment and thought content normal. Cognition and memory are normal. She does not exhibit a depressed mood.          Assessment & Plan:   Problem List Items Addressed This Visit     Unprioritized   Depression - Primary     Symptoms improved with higher dose of Fluoxetine. Will continue for now. Follow up in 4 months or sooner as needed.    Relevant Medications      ALPRAZolam (XANAX) tablet   Dyspnea on exertion     Symptoms of dyspnea on exertion. EKG today stable compared to previous. Recommended cardiology evaluation for stress test, however pt declines for now. She will call if she would like to proceed with this.    Relevant Orders      EKG 12-Lead (Completed)  Return in about 4 months (around 07/30/2014) for Wellness Visit.

## 2014-03-30 NOTE — Progress Notes (Signed)
Pre visit review using our clinic review tool, if applicable. No additional management support is needed unless otherwise documented below in the visit note. 

## 2014-03-30 NOTE — Assessment & Plan Note (Signed)
Symptoms improved with higher dose of Fluoxetine. Will continue for now. Follow up in 4 months or sooner as needed.

## 2014-03-30 NOTE — Addendum Note (Signed)
Addended by: Vernetta Honey on: 03/30/2014 03:23 PM   Modules accepted: Orders

## 2014-04-26 ENCOUNTER — Other Ambulatory Visit: Payer: Self-pay | Admitting: Internal Medicine

## 2014-04-26 NOTE — Telephone Encounter (Signed)
Last refill 6.26.15, last OV 6.30.15 next OV 10.30.15.  Please advise refill.

## 2014-04-27 NOTE — Telephone Encounter (Signed)
Pt was given 3 refills on 03/30/14 prescription. Please have her call her pharmacy for refills.

## 2014-04-28 ENCOUNTER — Other Ambulatory Visit: Payer: Self-pay | Admitting: Internal Medicine

## 2014-04-28 NOTE — Telephone Encounter (Signed)
Last refill 6.26.15, last OV 6.30.15, next OV 10.30.15.  Please advise refill.

## 2014-05-18 ENCOUNTER — Other Ambulatory Visit: Payer: Self-pay | Admitting: Internal Medicine

## 2014-06-22 ENCOUNTER — Other Ambulatory Visit: Payer: Self-pay | Admitting: *Deleted

## 2014-06-22 ENCOUNTER — Ambulatory Visit (INDEPENDENT_AMBULATORY_CARE_PROVIDER_SITE_OTHER): Payer: Medicare Other | Admitting: Internal Medicine

## 2014-06-22 ENCOUNTER — Encounter: Payer: Self-pay | Admitting: Internal Medicine

## 2014-06-22 VITALS — BP 122/80 | HR 88 | Temp 98.3°F | Ht 64.5 in | Wt 179.8 lb

## 2014-06-22 DIAGNOSIS — J209 Acute bronchitis, unspecified: Secondary | ICD-10-CM

## 2014-06-22 MED ORDER — PREDNISONE 10 MG PO TABS: ORAL_TABLET | ORAL | Status: DC

## 2014-06-22 MED ORDER — ALBUTEROL SULFATE HFA 108 (90 BASE) MCG/ACT IN AERS: 2.0000 | INHALATION_SPRAY | Freq: Four times a day (QID) | RESPIRATORY_TRACT | Status: DC | PRN

## 2014-06-22 MED ORDER — LEVOFLOXACIN 500 MG PO TABS: 500.0000 mg | ORAL_TABLET | Freq: Every day | ORAL | Status: DC

## 2014-06-22 NOTE — Progress Notes (Signed)
Pre visit review using our clinic review tool, if applicable. No additional management support is needed unless otherwise documented below in the visit note. 

## 2014-06-22 NOTE — Patient Instructions (Signed)

## 2014-06-22 NOTE — Telephone Encounter (Signed)
Fax from [facility], insurance will cover proair, needs new Rx. Rx sent to pharmacy by escript

## 2014-06-22 NOTE — Assessment & Plan Note (Signed)
Symptoms consistent with acute bronchitis. Will start Prednisone taper and Levaquin. OTC Robitussin for cough. Prn albuterol for dyspnea or wheezing. Follow up if symptoms not improving by Thursday this week.

## 2014-06-22 NOTE — Progress Notes (Signed)
   Subjective:    Patient ID: Tammy Choi, female    DOB: [birth year], 68 y.o.   MRN: [id number]  HPI 68YO female presents for acute visit.  Has had congestion and cough for over 1 week. Taking OTC Mucinex with no improvement. Feels short of breath at times. No fever, chills. Cough productive of clear mucous.  Review of Systems  Constitutional: Positive for fatigue. Negative for fever, chills and unexpected weight change.  HENT: Positive for congestion, postnasal drip and rhinorrhea. Negative for ear discharge, ear pain, facial swelling, hearing loss, mouth sores, nosebleeds, sinus pressure, sneezing, sore throat, tinnitus, trouble swallowing and voice change.   Eyes: Negative for pain, discharge, redness and visual disturbance.  Respiratory: Positive for cough, chest tightness, shortness of breath and wheezing. Negative for stridor.   Cardiovascular: Negative for chest pain, palpitations and leg swelling.  Musculoskeletal: Negative for arthralgias, myalgias, neck pain and neck stiffness.  Skin: Negative for color change and rash.  Neurological: Negative for dizziness, weakness, light-headedness and headaches.  Hematological: Negative for adenopathy.  Psychiatric/Behavioral: Positive for sleep disturbance.       Objective:    BP 122/80  Pulse 88  Temp(Src) 98.3 F (36.8 C) (Oral)  Ht 5' 4.5" (1.638 m)  Wt 179 lb 12 oz (81.534 kg)  BMI 30.39 kg/m2  SpO2 95% Physical Exam  Constitutional: She is oriented to person, place, and time. She appears well-developed and well-nourished. No distress.  HENT:  Head: Normocephalic and atraumatic.  Right Ear: External ear normal.  Left Ear: External ear normal.  Nose: Nose normal.  Mouth/Throat: Oropharynx is clear and moist. No oropharyngeal exudate.  Eyes: Conjunctivae are normal. Pupils are equal, round, and reactive to light. Right eye exhibits no discharge. Left eye exhibits no discharge. No scleral icterus.  Neck: Normal range  of motion. Neck supple. No tracheal deviation present. No thyromegaly present.  Cardiovascular: Normal rate, regular rhythm, normal heart sounds and intact distal pulses.  Exam reveals no gallop and no friction rub.   No murmur heard. Pulmonary/Chest: Effort normal. No accessory muscle usage. Not tachypneic. No respiratory distress. She has decreased breath sounds. She has no wheezes. She has rhonchi. She has no rales. She exhibits no tenderness.  Musculoskeletal: Normal range of motion. She exhibits no edema and no tenderness.  Lymphadenopathy:    She has no cervical adenopathy.  Neurological: She is alert and oriented to person, place, and time. No cranial nerve deficit. She exhibits normal muscle tone. Coordination normal.  Skin: Skin is warm and dry. No rash noted. She is not diaphoretic. No erythema. No pallor.  Psychiatric: She has a normal mood and affect. Her behavior is normal. Judgment and thought content normal.          Assessment & Plan:   Problem List Items Addressed This Visit     Unprioritized   Acute bronchitis - Primary     Symptoms consistent with acute bronchitis. Will start Prednisone taper and Levaquin. OTC Robitussin for cough. Prn albuterol for dyspnea or wheezing. Follow up if symptoms not improving by Thursday this week.    Relevant Medications      predniSONE (DELTASONE) tablet      levofloxacin (LEVAQUIN) tablet      ALBUTEROL SULFATE HFA 108 (90 BASE) MCG/ACT IN AERS       Return in about 2 weeks (around 07/06/2014) for Recheck.

## 2014-07-23 ENCOUNTER — Other Ambulatory Visit: Payer: Self-pay | Admitting: Internal Medicine

## 2014-07-23 NOTE — Telephone Encounter (Signed)
Electronic Rx request for xanax received. Patients last office visit was 06/22/14 and medication last filled 04/29/14. Please advise.

## 2014-07-26 NOTE — Telephone Encounter (Signed)
Rx phoned into [facility] as ordered.

## 2014-07-30 ENCOUNTER — Encounter: Payer: Self-pay | Admitting: *Deleted

## 2014-07-30 ENCOUNTER — Ambulatory Visit (INDEPENDENT_AMBULATORY_CARE_PROVIDER_SITE_OTHER): Payer: Medicare Other | Admitting: Internal Medicine

## 2014-07-30 ENCOUNTER — Encounter: Payer: Self-pay | Admitting: Internal Medicine

## 2014-07-30 VITALS — BP 112/68 | HR 83 | Temp 98.3°F | Ht 64.0 in | Wt 180.5 lb

## 2014-07-30 DIAGNOSIS — F32A Depression, unspecified: Secondary | ICD-10-CM

## 2014-07-30 DIAGNOSIS — F329 Major depressive disorder, single episode, unspecified: Secondary | ICD-10-CM

## 2014-07-30 DIAGNOSIS — I10 Essential (primary) hypertension: Secondary | ICD-10-CM

## 2014-07-30 DIAGNOSIS — Z23 Encounter for immunization: Secondary | ICD-10-CM

## 2014-07-30 DIAGNOSIS — Z720 Tobacco use: Secondary | ICD-10-CM

## 2014-07-30 DIAGNOSIS — Z1239 Encounter for other screening for malignant neoplasm of breast: Secondary | ICD-10-CM

## 2014-07-30 DIAGNOSIS — E669 Obesity, unspecified: Secondary | ICD-10-CM | POA: Insufficient documentation

## 2014-07-30 DIAGNOSIS — E785 Hyperlipidemia, unspecified: Secondary | ICD-10-CM

## 2014-07-30 DIAGNOSIS — Z0000 Encounter for general adult medical examination without abnormal findings: Secondary | ICD-10-CM

## 2014-07-30 DIAGNOSIS — F17200 Nicotine dependence, unspecified, uncomplicated: Secondary | ICD-10-CM

## 2014-07-30 LAB — COMPREHENSIVE METABOLIC PANEL
ALBUMIN: 3.6 g/dL (ref 3.5–5.2)
ALT: 17 U/L (ref 0–35)
AST: 22 U/L (ref 0–37)
Alkaline Phosphatase: 75 U/L (ref 39–117)
BUN: 22 mg/dL (ref 6–23)
CO2: 27 meq/L (ref 19–32)
Calcium: 9.1 mg/dL (ref 8.4–10.5)
Chloride: 103 mEq/L (ref 96–112)
Creatinine, Ser: 0.9 mg/dL (ref 0.4–1.2)
GFR: 65.14 mL/min (ref >60.00)
GLUCOSE: 93 mg/dL (ref 70–99)
POTASSIUM: 4.6 meq/L (ref 3.5–5.1)
Sodium: 136 mEq/L (ref 135–145)
Total Bilirubin: 0.4 mg/dL (ref 0.2–1.2)
Total Protein: 7.1 g/dL (ref 6.0–8.3)

## 2014-07-30 LAB — LIPID PANEL
CHOLESTEROL: 133 mg/dL (ref 0–200)
HDL: 57.8 mg/dL (ref >39.00)
LDL Cholesterol: 56 mg/dL (ref 0–99)
NonHDL: 75.2
Total CHOL/HDL Ratio: 2
Triglycerides: 96 mg/dL (ref 0.0–149.0)
VLDL: 19.2 mg/dL (ref 0.0–40.0)

## 2014-07-30 LAB — CBC WITH DIFFERENTIAL/PLATELET
BASOS PCT: 0.6 % (ref 0.0–3.0)
Basophils Absolute: 0.1 10*3/uL (ref 0.0–0.1)
EOS PCT: 1.6 % (ref 0.0–5.0)
Eosinophils Absolute: 0.1 10*3/uL (ref 0.0–0.7)
HEMATOCRIT: 42.4 % (ref 36.0–46.0)
Hemoglobin: 13.7 g/dL (ref 12.0–15.0)
LYMPHS ABS: 3.5 10*3/uL (ref 0.7–4.0)
Lymphocytes Relative: 41.3 % (ref 12.0–46.0)
MCHC: 32.3 g/dL (ref 30.0–36.0)
MCV: 87.9 fl (ref 78.0–100.0)
Monocytes Absolute: 0.5 10*3/uL (ref 0.1–1.0)
Monocytes Relative: 5.5 % (ref 3.0–12.0)
NEUTROS PCT: 51 % (ref 43.0–77.0)
Neutro Abs: 4.3 10*3/uL (ref 1.4–7.7)
Platelets: 237 10*3/uL (ref 150.0–400.0)
RBC: 4.82 Mil/uL (ref 3.87–5.11)
RDW: 14.3 % (ref 11.5–15.5)
WBC: 8.4 10*3/uL (ref 4.0–10.5)

## 2014-07-30 LAB — MICROALBUMIN / CREATININE URINE RATIO
CREATININE, U: 31.1 mg/dL
Microalb Creat Ratio: 0.6 mg/g (ref 0.0–30.0)

## 2014-07-30 LAB — VITAMIN D 25 HYDROXY (VIT D DEFICIENCY, FRACTURES): VITD: 40.11 ng/mL (ref 30.00–100.00)

## 2014-07-30 MED ORDER — FLUOXETINE HCL 20 MG PO TABS: 40.0000 mg | ORAL_TABLET | Freq: Every day | ORAL | Status: DC

## 2014-07-30 NOTE — Assessment & Plan Note (Signed)
Encouraged smoking cessation. Will set up CT chest to screen for lung cancer.

## 2014-07-30 NOTE — Progress Notes (Signed)
Subjective:    Patient ID: Tammy Choi, female    DOB: 28-Jan-1946, 68 y.o.   MRN: [id number]  HPI The patient is here for annual Medicare wellness examination and management of other chronic and acute problems.   The risk factors are reflected in the social history.  The roster of all physicians providing medical care to patient - is listed in the Snapshot section of the chart.  Activities of daily living:  The patient is 100% independent in all ADLs: dressing, toileting, feeding as well as independent mobility. Lives with daughter, Tammy Choi. No pets in home.  Lives in one story hom.  Home safety : The patient has smoke detectors in the home. They wear seatbelts.  There are no firearms at home. There is no violence in the home.   There is no risks for hepatitis, STDs or HIV. There is no history of blood transfusion. They have no travel history to infectious disease endemic areas of the world.  The patient has not seen their dentist in the last six month. (Has dentures, Tammy Choi) They have not seen their eye doctor in the last year. Will schedule appointment. Dermatologist - Tammy Choi. Will schedule. No issues with hearing. They have deferred audiologic testing in the last year.   They do not  have excessive sun exposure. Discussed the need for sun protection: hats, long sleeves and use of sunscreen if there is significant sun exposure.   Diet: the importance of a healthy diet is discussed. They do have a healthy diet.  The benefits of regular aerobic exercise were discussed. She walks every day if possible.  Depression screen: there are no signs or vegative symptoms of depression- irritability, change in appetite, anhedonia, sadness/tearfullness.  Cognitive assessment: the patient manages all their financial and personal affairs and is actively engaged. They could relate day,date,year and events.  The following portions of the patient's history were reviewed and  updated as appropriate: allergies, current medications, past family history, past medical history,  past surgical history, past social history  and problem list.  Visual acuity was not assessed per patient preference since she has regular follow up with her ophthalmologist. Hearing and body mass index were assessed and reviewed.   During the course of the visit the patient was educated and counseled about appropriate screening and preventive services including : fall prevention , diabetes screening, nutrition counseling, colorectal cancer screening, and recommended immunizations.    Review of Systems  Constitutional: Negative for fever, chills, appetite change, fatigue and unexpected weight change.  Eyes: Negative for visual disturbance.  Respiratory: Negative for cough and shortness of breath.   Cardiovascular: Negative for chest pain and leg swelling.  Gastrointestinal: Negative for nausea, vomiting, abdominal pain, diarrhea, constipation, blood in stool, abdominal distention and anal bleeding.  Musculoskeletal: Negative for arthralgias, back pain and myalgias.  Skin: Negative for color change and rash.  Hematological: Negative for adenopathy. Does not bruise/bleed easily.  Psychiatric/Behavioral: Negative for dysphoric mood. The patient is not nervous/anxious.        Objective:    BP 112/68  Pulse 83  Temp(Src) 98.3 F (36.8 C) (Oral)  Ht 5\' 4"  (1.626 m)  Wt 180 lb 8 oz (81.874 kg)  BMI 30.97 kg/m2  SpO2 94% Physical Exam  Constitutional: She is oriented to person, place, and time. She appears well-developed and well-nourished. No distress.  HENT:  Head: Normocephalic and atraumatic.  Right Ear: External ear normal.  Left Ear: External ear normal.  Nose: Nose normal.  Mouth/Throat: Oropharynx is clear and moist. No oropharyngeal exudate.  Eyes: Conjunctivae are normal. Pupils are equal, round, and reactive to light. Right eye exhibits no discharge. Left eye exhibits no  discharge. No scleral icterus.  Neck: Normal range of motion. Neck supple. No tracheal deviation present. No thyromegaly present.  Cardiovascular: Normal rate, regular rhythm, normal heart sounds and intact distal pulses.  Exam reveals no gallop and no friction rub.   No murmur heard. Pulmonary/Chest: Effort normal and breath sounds normal. No accessory muscle usage. Not tachypneic. No respiratory distress. She has no decreased breath sounds. She has no wheezes. She has no rales. She exhibits no tenderness. Right breast exhibits no inverted nipple, no mass, no nipple discharge, no skin change and no tenderness. Left breast exhibits no inverted nipple, no mass, no nipple discharge, no skin change and no tenderness. Breasts are symmetrical.  Abdominal: Soft. Bowel sounds are normal. She exhibits no distension and no mass. There is no tenderness. There is no rebound and no guarding.  Musculoskeletal: Normal range of motion. She exhibits no edema and no tenderness.  Lymphadenopathy:    She has no cervical adenopathy.  Neurological: She is alert and oriented to person, place, and time. No cranial nerve deficit. She exhibits normal muscle tone. Coordination normal.  Skin: Skin is warm and dry. No rash noted. She is not diaphoretic. No erythema. No pallor.  Psychiatric: She has a normal mood and affect. Her behavior is normal. Judgment and thought content normal.          Assessment & Plan:   Problem List Items Addressed This Visit     Unprioritized   Depression     Symptoms well controlled on Fluoxetine 40mg  daily. Will continue.    Relevant Medications      FLUoxetine (PROZAC) tablet   Medicare annual wellness visit, subsequent - Primary     General medical exam normal today including breast exam. Mammogram ordered. CT chest to screen for lung cancer in a smoker ordered. Colonoscopy is UTD. Flu vaccine given. Pt will get shingles vaccine at a pharmacy. Labs today including CBC, CMP, lipids.  Encouraged healthy diet and exercise.    Relevant Orders      CBC with Differential      Comprehensive metabolic panel      Lipid panel      Microalbumin / creatinine urine ratio      Vit D  25 hydroxy (rtn osteoporosis monitoring)   Obesity (BMI 30-39.9)      Wt Readings from Last 3 Encounters:  07/30/14 180 lb 8 oz (81.874 kg)  06/22/14 179 lb 12 oz (81.534 kg)  03/30/14 174 lb 8 oz (79.153 kg)   Body mass index is 30.97 kg/(m^2). Encouraged healthy diet and exercise with goal of weight loss.    Tobacco use disorder     Encouraged smoking cessation. Will set up CT chest to screen for lung cancer.     Other Visit Diagnoses   Screening for breast cancer        Relevant Orders       MM Digital Screening    Tobacco use        Relevant Orders       CT Chest Wo Contrast    Encounter for immunization            Return in about 6 months (around 01/29/2015) for Recheck.

## 2014-07-30 NOTE — Progress Notes (Signed)
Pre visit review using our clinic review tool, if applicable. No additional management support is needed unless otherwise documented below in the visit note. 

## 2014-07-30 NOTE — Assessment & Plan Note (Signed)
General medical exam normal today including breast exam. Mammogram ordered. CT chest to screen for lung cancer in a smoker ordered. Colonoscopy is UTD. Flu vaccine given. Pt will get shingles vaccine at a pharmacy. Labs today including CBC, CMP, lipids. Encouraged healthy diet and exercise.

## 2014-07-30 NOTE — Patient Instructions (Signed)

## 2014-07-30 NOTE — Assessment & Plan Note (Signed)
Symptoms well controlled on Fluoxetine 40mg  daily. Will continue.

## 2014-07-30 NOTE — Assessment & Plan Note (Signed)
Wt Readings from Last 3 Encounters:  07/30/14 180 lb 8 oz (81.874 kg)  06/22/14 179 lb 12 oz (81.534 kg)  03/30/14 174 lb 8 oz (79.153 kg)   Body mass index is 30.97 kg/(m^2). Encouraged healthy diet and exercise with goal of weight loss.

## 2014-08-02 ENCOUNTER — Encounter: Payer: Self-pay | Admitting: Internal Medicine

## 2014-08-02 ENCOUNTER — Encounter: Payer: Self-pay | Admitting: *Deleted

## 2014-08-03 ENCOUNTER — Other Ambulatory Visit: Payer: Self-pay | Admitting: Internal Medicine

## 2014-08-03 DIAGNOSIS — F17200 Nicotine dependence, unspecified, uncomplicated: Secondary | ICD-10-CM

## 2014-08-24 ENCOUNTER — Other Ambulatory Visit: Payer: Self-pay | Admitting: Internal Medicine

## 2014-08-24 NOTE — Telephone Encounter (Signed)
Rx faxed

## 2014-08-24 NOTE — Telephone Encounter (Signed)
Last refill 10.26.15, last OV 10.30.15.  Advise refill

## 2014-09-01 ENCOUNTER — Ambulatory Visit: Payer: Self-pay | Admitting: Hospice and Palliative Medicine

## 2014-09-06 ENCOUNTER — Other Ambulatory Visit: Payer: Self-pay | Admitting: *Deleted

## 2014-09-06 ENCOUNTER — Encounter (INDEPENDENT_AMBULATORY_CARE_PROVIDER_SITE_OTHER): Payer: Self-pay

## 2014-09-06 ENCOUNTER — Ambulatory Visit (INDEPENDENT_AMBULATORY_CARE_PROVIDER_SITE_OTHER): Payer: Medicare Other | Admitting: Nurse Practitioner

## 2014-09-06 ENCOUNTER — Encounter: Payer: Self-pay | Admitting: Nurse Practitioner

## 2014-09-06 VITALS — BP 104/62 | HR 95 | Temp 98.3°F | Resp 14 | Ht 64.0 in | Wt 180.5 lb

## 2014-09-06 DIAGNOSIS — J209 Acute bronchitis, unspecified: Secondary | ICD-10-CM

## 2014-09-06 MED ORDER — ALBUTEROL SULFATE HFA 108 (90 BASE) MCG/ACT IN AERS: 2.0000 | INHALATION_SPRAY | Freq: Four times a day (QID) | RESPIRATORY_TRACT | Status: DC | PRN

## 2014-09-06 MED ORDER — METHYLPREDNISOLONE (PAK) 4 MG PO TABS: ORAL_TABLET | ORAL | Status: DC

## 2014-09-06 MED ORDER — AZITHROMYCIN 250 MG PO TABS: ORAL_TABLET | ORAL | Status: DC

## 2014-09-06 NOTE — Progress Notes (Signed)
Pre visit review using our clinic review tool, if applicable. No additional management support is needed unless otherwise documented below in the visit note. 

## 2014-09-06 NOTE — Progress Notes (Signed)
Subjective:    Patient ID: Tammy Choi, female    DOB: 06-01-1946, 68 y.o.   MRN: [id number]  HPI  Tammy Choi is a 68 yo female with a CC of productive cough x 6 weeks.   1) Acute Bronchitis-  Clear/white productive sputum, rhinorrhea with clear mucous, cough wakes up from sleep. She is stopping smoking and down to 1 cigarette a day. She feels her chest is tight again and she is out of albuterol inhaler. Pt does have COPD and smokes. She was on Levaquin and had some relief, but then symptoms worsened.   Prednisone- Helpful Albuterol- out and helpful Robitussin- helps for a short period of time  Chest CT- pt reports negative findings. Unsure at this time of results.  Review of Systems  Constitutional: Positive for fatigue. Negative for fever, chills, diaphoresis and appetite change.  HENT: Positive for postnasal drip and rhinorrhea. Negative for congestion, ear discharge, ear pain, sinus pressure, sneezing and sore throat.   Eyes: Negative for visual disturbance.  Respiratory: Positive for cough and shortness of breath. Negative for chest tightness.        SOB- Has COPD  Cardiovascular: Negative for chest pain, palpitations and leg swelling.  Gastrointestinal: Negative for nausea, vomiting, diarrhea and constipation.  Genitourinary: Negative for dysuria, urgency and frequency.  Musculoskeletal: Negative for myalgias.  Skin: Negative for rash.  Neurological: Negative for dizziness and headaches.   Past Medical History  Diagnosis Date  . HTN (hypertension)     History   Social History  . Marital Status: Single    Spouse Name: N/A    Number of Children: N/A  . Years of Education: N/A   Occupational History  . Retired Chiropractor    Social History Main Topics  . Smoking status: Current Every Day Smoker -- 0.25 packs/day    Types: Cigarettes  . Smokeless tobacco: Never Used     Comment: 1-2 Cigs/day  . Alcohol Use: No  . Drug Use: No  . Sexual  Activity: Not on file   Other Topics Concern  . Not on file   Social History Narrative    Past Surgical History  Procedure Laterality Date  . Left leg surgery    . Abdominal hysterectomy      Family History  Problem Relation Age of Onset  . Kidney disease Mother   . Heart disease Father   . Breast cancer Sister     Allergies  Allergen Reactions  . Penicillins Shortness Of Breath and Swelling    Current Outpatient Prescriptions on File Prior to Visit  Medication Sig Dispense Refill  . ALPRAZolam (XANAX) 0.5 MG tablet TAKE ONE TABLET BY MOUTH TWICE DAILY 60 tablet 0  . atorvastatin (LIPITOR) 20 MG tablet Take 1 tablet (20 mg total) by mouth daily at 6 PM. 30 tablet 5  . cholecalciferol (VITAMIN D) 1000 UNITS tablet Take 2,000 Units by mouth daily.    Marland Kitchen FLUoxetine (PROZAC) 20 MG tablet Take 2 tablets (40 mg total) by mouth daily. 60 tablet 3  . lisinopril (PRINIVIL,ZESTRIL) 20 MG tablet TAKE ONE TABLET BY MOUTH EVERY DAY 90 tablet 4  . Multiple Vitamins-Minerals (MULTIVITAMIN WITH MINERALS) tablet Take 1 tablet by mouth daily.     No current facility-administered medications on file prior to visit.        Objective:   Physical Exam  Constitutional: She is oriented to person, place, and time. She appears well-developed and well-nourished. No distress.  HENT:  Head:  Normocephalic and atraumatic.  Right Ear: External ear normal.  Left Ear: External ear normal.  Nose: Nose normal.  Mouth/Throat: Oropharynx is clear and moist. No oropharyngeal exudate.  TM's clear bilaterally  Neck: Normal range of motion. Neck supple. No thyromegaly present.  Cardiovascular: Normal rate and regular rhythm.   Pulmonary/Chest: Effort normal and breath sounds normal. No respiratory distress. She has no wheezes. She has no rales. She exhibits no tenderness.  Lymphadenopathy:    She has no cervical adenopathy.  Neurological: She is alert and oriented to person, place, and time.  Skin: Skin  is warm and dry. No rash noted. She is not diaphoretic.  Psychiatric: She has a normal mood and affect. Her behavior is normal. Judgment and thought content normal.      BP 104/62 mmHg  Pulse 95  Temp(Src) 98.3 F (36.8 C) (Oral)  Resp 14  Ht 5\' 4"  (1.626 m)  Wt 180 lb 8 oz (81.874 kg)  BMI 30.97 kg/m2  SpO2 95%     Assessment & Plan:

## 2014-09-06 NOTE — Telephone Encounter (Signed)
Insurance prefers [facility]. Rx sent to pharmacy by escript

## 2014-09-06 NOTE — Patient Instructions (Signed)
Please take the full 5 days of antibiotics.  Uses the albuterol inhaler as needed for chest tightness/wheezing Call us if your symptoms worsen or fail to improve.  Call us if you have a fever greater than 101 degrees Happy Holidays!

## 2014-09-06 NOTE — Assessment & Plan Note (Addendum)
Symptoms resolved mostly, but have come back. Pt feels that this is round two of bronchitis. She will continue with the Robitussin for cough, Rx for prednisone taper, z-pack for 5 days, and albuterol inhaler for wheezing/chest tightness from COPD. Fu as needed.

## 2014-09-13 ENCOUNTER — Ambulatory Visit: Payer: Self-pay | Admitting: Internal Medicine

## 2014-09-13 LAB — HM MAMMOGRAPHY: HM Mammogram: NEGATIVE

## 2014-09-14 ENCOUNTER — Encounter: Payer: Self-pay | Admitting: *Deleted

## 2014-09-20 ENCOUNTER — Other Ambulatory Visit: Payer: Self-pay | Admitting: Internal Medicine

## 2014-09-21 NOTE — Telephone Encounter (Signed)
Ok refill? 

## 2014-09-21 NOTE — Telephone Encounter (Signed)
Called to pharmacy 

## 2014-10-21 ENCOUNTER — Other Ambulatory Visit: Payer: Self-pay | Admitting: Internal Medicine

## 2014-10-21 NOTE — Telephone Encounter (Signed)
Called to pharmacy 

## 2014-10-21 NOTE — Telephone Encounter (Signed)
Lat visit 07/30/14, ok refill?

## 2014-10-26 ENCOUNTER — Encounter: Payer: Self-pay | Admitting: Internal Medicine

## 2014-11-11 ENCOUNTER — Other Ambulatory Visit: Payer: Self-pay | Admitting: Internal Medicine

## 2014-12-17 ENCOUNTER — Other Ambulatory Visit: Payer: Self-pay | Admitting: Internal Medicine

## 2015-01-18 ENCOUNTER — Other Ambulatory Visit: Payer: Self-pay | Admitting: Internal Medicine

## 2015-01-24 ENCOUNTER — Other Ambulatory Visit: Payer: Self-pay | Admitting: Internal Medicine

## 2015-01-24 NOTE — Telephone Encounter (Signed)
Appt 02/01/15

## 2015-01-26 ENCOUNTER — Other Ambulatory Visit: Payer: Self-pay | Admitting: Internal Medicine

## 2015-02-01 ENCOUNTER — Ambulatory Visit: Payer: Medicare Other | Admitting: Internal Medicine

## 2015-02-24 ENCOUNTER — Other Ambulatory Visit: Payer: Self-pay | Admitting: Internal Medicine

## 2015-02-25 NOTE — Telephone Encounter (Signed)
Last refill 10/21/14 with 3 refills.  Last OV 09/06/14, next appt 03/07/15.  Okay to refill?

## 2015-03-02 ENCOUNTER — Other Ambulatory Visit: Payer: Self-pay | Admitting: Internal Medicine

## 2015-03-07 ENCOUNTER — Encounter: Payer: Self-pay | Admitting: Internal Medicine

## 2015-03-07 ENCOUNTER — Ambulatory Visit (INDEPENDENT_AMBULATORY_CARE_PROVIDER_SITE_OTHER): Payer: Self-pay | Admitting: Internal Medicine

## 2015-03-07 VITALS — BP 125/78 | HR 79 | Temp 98.5°F | Ht 64.0 in | Wt 180.5 lb

## 2015-03-07 DIAGNOSIS — E669 Obesity, unspecified: Secondary | ICD-10-CM

## 2015-03-07 DIAGNOSIS — E785 Hyperlipidemia, unspecified: Secondary | ICD-10-CM | POA: Diagnosis not present

## 2015-03-07 DIAGNOSIS — I10 Essential (primary) hypertension: Secondary | ICD-10-CM | POA: Diagnosis not present

## 2015-03-07 LAB — COMPREHENSIVE METABOLIC PANEL
ALBUMIN: 4.2 g/dL (ref 3.5–5.2)
ALK PHOS: 83 U/L (ref 39–117)
ALT: 14 U/L (ref 0–35)
AST: 15 U/L (ref 0–37)
BUN: 22 mg/dL (ref 6–23)
CO2: 30 mEq/L (ref 19–32)
Calcium: 9.3 mg/dL (ref 8.4–10.5)
Chloride: 102 mEq/L (ref 96–112)
Creatinine, Ser: 0.79 mg/dL (ref 0.40–1.20)
GFR: 76.55 mL/min (ref >60.00)
Glucose, Bld: 92 mg/dL (ref 70–99)
Potassium: 5.4 mEq/L — ABNORMAL HIGH (ref 3.5–5.1)
SODIUM: 136 meq/L (ref 135–145)
TOTAL PROTEIN: 6.7 g/dL (ref 6.0–8.3)
Total Bilirubin: 0.4 mg/dL (ref 0.2–1.2)

## 2015-03-07 LAB — VITAMIN B12: Vitamin B-12: 356 pg/mL (ref 211–911)

## 2015-03-07 LAB — TSH: TSH: 1.47 u[IU]/mL (ref 0.35–4.50)

## 2015-03-07 MED ORDER — FLUOXETINE HCL 40 MG PO CAPS: 40.0000 mg | ORAL_CAPSULE | Freq: Every day | ORAL | Status: DC

## 2015-03-07 NOTE — Assessment & Plan Note (Signed)
Wt Readings from Last 3 Encounters:  03/07/15 180 lb 8 oz (81.874 kg)  09/06/14 180 lb 8 oz (81.874 kg)  07/30/14 180 lb 8 oz (81.874 kg)   Body mass index is 30.97 kg/(m^2). Encouraged healthy diet and exercise. Encouraged her to limit process carbohydrates.

## 2015-03-07 NOTE — Assessment & Plan Note (Signed)
BP Readings from Last 3 Encounters:  03/07/15 125/78  09/06/14 104/62  07/30/14 112/68   BP well controlled. Renal function with labs.

## 2015-03-07 NOTE — Patient Instructions (Signed)
Labs today.  6 month follow up for physical exam.

## 2015-03-07 NOTE — Progress Notes (Addendum)
Subjective:    Patient ID: Tammy Choi, female    DOB: January 12, 1946, 69 y.o.   MRN: [id number]  HPI 69YO female presents for follow up.  Trying to lose weight. Not following any particular diet. Not exercising.  Aside from this, feeling well. Compliant with medications.  Mild dyspnea on exertion, unchanged from previous. She attributes to her weight. No chest pain, palpitations, diaphoresis.   Wt Readings from Last 3 Encounters:  03/07/15 180 lb 8 oz (81.874 kg)  09/06/14 180 lb 8 oz (81.874 kg)  07/30/14 180 lb 8 oz (81.874 kg)     Past medical, surgical, family and social history per today's encounter.  Review of Systems  Constitutional: Negative for fever, chills, appetite change, fatigue and unexpected weight change.  Eyes: Negative for visual disturbance.  Respiratory: Negative for shortness of breath.   Cardiovascular: Negative for chest pain and leg swelling.  Gastrointestinal: Negative for abdominal pain.  Skin: Negative for color change and rash.  Hematological: Negative for adenopathy. Does not bruise/bleed easily.  Psychiatric/Behavioral: Negative for dysphoric mood. The patient is not nervous/anxious.        Objective:    BP 125/78 mmHg  Pulse 79  Temp(Src) 98.5 F (36.9 C) (Oral)  Ht 5\' 4"  (1.626 m)  Wt 180 lb 8 oz (81.874 kg)  BMI 30.97 kg/m2  SpO2 95% Physical Exam  Constitutional: She is oriented to person, place, and time. She appears well-developed and well-nourished. No distress.  HENT:  Head: Normocephalic and atraumatic.  Right Ear: External ear normal.  Left Ear: External ear normal.  Nose: Nose normal.  Mouth/Throat: Oropharynx is clear and moist. No oropharyngeal exudate.  Eyes: Conjunctivae are normal. Pupils are equal, round, and reactive to light. Right eye exhibits no discharge. Left eye exhibits no discharge. No scleral icterus.  Neck: Normal range of motion. Neck supple. No tracheal deviation present. No thyromegaly present.   Cardiovascular: Normal rate, regular rhythm, normal heart sounds and intact distal pulses.  Exam reveals no gallop and no friction rub.   No murmur heard. Pulmonary/Chest: Effort normal and breath sounds normal. No respiratory distress. She has no wheezes. She has no rales. She exhibits no tenderness.  Musculoskeletal: Normal range of motion. She exhibits no edema or tenderness.  Lymphadenopathy:    She has no cervical adenopathy.  Neurological: She is alert and oriented to person, place, and time. No cranial nerve deficit. She exhibits normal muscle tone. Coordination normal.  Skin: Skin is warm and dry. No rash noted. She is not diaphoretic. No erythema. No pallor.  Psychiatric: She has a normal mood and affect. Her behavior is normal. Judgment and thought content normal.          Assessment & Plan:   Problem List Items Addressed This Visit      Unprioritized   Hyperlipidemia    Will check LFTs with labs. Continue Atorvastatin.      Hypertension - Primary    BP Readings from Last 3 Encounters:  03/07/15 125/78  09/06/14 104/62  07/30/14 112/68   BP well controlled. Renal function with labs.      Relevant Orders   Comprehensive metabolic panel   Obesity (BMI 30-39.9)    Wt Readings from Last 3 Encounters:  03/07/15 180 lb 8 oz (81.874 kg)  09/06/14 180 lb 8 oz (81.874 kg)  07/30/14 180 lb 8 oz (81.874 kg)   Body mass index is 30.97 kg/(m^2). Encouraged healthy diet and exercise. Encouraged her to limit process  carbohydrates.      Relevant Orders   TSH   B12       Return in about 6 months (around 09/06/2015) for Wellness Visit.

## 2015-03-07 NOTE — Progress Notes (Signed)
Pre visit review using our clinic review tool, if applicable. No additional management support is needed unless otherwise documented below in the visit note. 

## 2015-03-07 NOTE — Assessment & Plan Note (Signed)
Will check LFTs with labs. Continue Atorvastatin.

## 2015-03-08 ENCOUNTER — Other Ambulatory Visit: Payer: PPO

## 2015-03-09 ENCOUNTER — Other Ambulatory Visit (INDEPENDENT_AMBULATORY_CARE_PROVIDER_SITE_OTHER): Payer: PPO

## 2015-03-09 ENCOUNTER — Telehealth: Payer: Self-pay | Admitting: *Deleted

## 2015-03-09 DIAGNOSIS — E875 Hyperkalemia: Secondary | ICD-10-CM

## 2015-03-09 NOTE — Telephone Encounter (Signed)
BMP for hyperkalemia 

## 2015-03-09 NOTE — Telephone Encounter (Signed)
Labs and dx?  

## 2015-03-10 LAB — BASIC METABOLIC PANEL
BUN: 15 mg/dL (ref 6–23)
CO2: 31 mEq/L (ref 19–32)
Calcium: 9.5 mg/dL (ref 8.4–10.5)
Chloride: 102 mEq/L (ref 96–112)
Creatinine, Ser: 0.85 mg/dL (ref 0.40–1.20)
GFR: 70.35 mL/min (ref >60.00)
GLUCOSE: 92 mg/dL (ref 70–99)
Potassium: 4.7 mEq/L (ref 3.5–5.1)
SODIUM: 137 meq/L (ref 135–145)

## 2015-03-11 ENCOUNTER — Telehealth: Payer: Self-pay | Admitting: Internal Medicine

## 2015-03-11 NOTE — Telephone Encounter (Signed)
Pt called returning a call.. Please advise pt.

## 2015-04-06 ENCOUNTER — Other Ambulatory Visit: Payer: Self-pay | Admitting: Internal Medicine

## 2015-04-06 NOTE — Telephone Encounter (Signed)
Last visit 03/03/15, ok refill?

## 2015-04-07 NOTE — Telephone Encounter (Signed)
Rx phoned into pharmacy.

## 2015-04-07 NOTE — Telephone Encounter (Signed)
Refill for 60 days.  Will need follow up in September

## 2015-04-19 ENCOUNTER — Ambulatory Visit (INDEPENDENT_AMBULATORY_CARE_PROVIDER_SITE_OTHER): Payer: PPO | Admitting: Nurse Practitioner

## 2015-04-19 ENCOUNTER — Encounter: Payer: Self-pay | Admitting: Nurse Practitioner

## 2015-04-19 VITALS — BP 124/84 | HR 92 | Temp 99.6°F | Resp 14 | Ht 64.0 in | Wt 181.6 lb

## 2015-04-19 DIAGNOSIS — J209 Acute bronchitis, unspecified: Secondary | ICD-10-CM | POA: Diagnosis not present

## 2015-04-19 MED ORDER — PREDNISONE 10 MG PO TABS: ORAL_TABLET | ORAL | Status: DC

## 2015-04-19 MED ORDER — AZITHROMYCIN 250 MG PO TABS: ORAL_TABLET | ORAL | Status: DC

## 2015-04-19 NOTE — Patient Instructions (Addendum)
Please take a probiotic ( Align, Floraque or Culturelle) while you are on the antibiotic to prevent a serious antibiotic associated diarrhea  Called clostirudium dificile colitis and a vaginal yeast infection.   Follow up next week.    Prednisone with breakfast  6 tablets on day 1, 5 tablets on day 2, 4 tablets on day 3, 3 tablets on day 4, 2 tablets day 5, 1 tablet on day 6...done!

## 2015-04-19 NOTE — Progress Notes (Signed)
   Subjective:    Patient ID: Tammy Choi, female    DOB: [birth year], 69 y.o.   MRN: [id number]  HPI  Ms. Harral is a 69 yo female with a CC of bronchitis.   1) Reports symptoms x 8-9 days, worsening, breathing is worse, coughing up mucous- clear, no-one sick in the family   Tussin OTC- at night, not helpful  Ibuprofen- helped headache   Review of Systems  Constitutional: Positive for chills and fatigue. Negative for fever and diaphoresis.  HENT: Positive for postnasal drip and rhinorrhea. Negative for congestion, ear discharge, ear pain, sinus pressure, sneezing and sore throat.   Eyes: Negative for visual disturbance.  Respiratory: Positive for cough, chest tightness, shortness of breath and wheezing.   Cardiovascular: Negative for chest pain, palpitations and leg swelling.  Gastrointestinal: Negative for nausea, vomiting and diarrhea.  Skin: Negative for rash.  Neurological: Negative for dizziness, weakness and headaches.      Objective:   Physical Exam  Constitutional: She is oriented to person, place, and time. She appears well-developed and well-nourished. No distress.  BP 124/84 mmHg  Pulse 92  Temp(Src) 99.6 F (37.6 C)  Resp 14  Ht 5\' 4"  (1.626 m)  Wt 181 lb 9.6 oz (82.373 kg)  BMI 31.16 kg/m2  SpO2 91%   HENT:  Head: Normocephalic and atraumatic.  Right Ear: External ear normal.  Left Ear: External ear normal.  Mouth/Throat: Oropharynx is clear and moist.  Eyes: EOM are normal. Pupils are equal, round, and reactive to light. Right eye exhibits no discharge. Left eye exhibits no discharge. No scleral icterus.  Neck: Normal range of motion. Neck supple. No thyromegaly present.  Cardiovascular: Normal rate, regular rhythm, normal heart sounds and intact distal pulses.  Exam reveals no gallop and no friction rub.   No murmur heard. Pulmonary/Chest: Effort normal. No respiratory distress. She has wheezes. She has no rales. She exhibits no tenderness.    Rhonchi upper and lower lobes  Lymphadenopathy:    She has no cervical adenopathy.  Neurological: She is alert and oriented to person, place, and time. No cranial nerve deficit. She exhibits normal muscle tone. Coordination normal.  Skin: Skin is warm and dry. No rash noted. She is not diaphoretic.  Psychiatric: She has a normal mood and affect. Her behavior is normal. Judgment and thought content normal.      Assessment & Plan:  Acute Bronchitis  1) Treatment with Z-pack and prednisone 2) Instructions given on AVS and reviewed with pt  3) Probiotics encouraged 4) FU prn worsening/failure to improve.

## 2015-04-19 NOTE — Progress Notes (Signed)
Pre visit review using our clinic review tool, if applicable. No additional management support is needed unless otherwise documented below in the visit note. 

## 2015-04-26 ENCOUNTER — Ambulatory Visit: Payer: PPO | Admitting: Nurse Practitioner

## 2015-05-13 ENCOUNTER — Other Ambulatory Visit: Payer: Self-pay | Admitting: Internal Medicine

## 2015-05-13 NOTE — Telephone Encounter (Signed)
No lipid since 10/15 ok to fill?

## 2015-06-02 ENCOUNTER — Other Ambulatory Visit: Payer: Self-pay | Admitting: Internal Medicine

## 2015-06-02 NOTE — Telephone Encounter (Signed)
rx faxed

## 2015-06-02 NOTE — Telephone Encounter (Signed)
Last OV 6.6.16.  Please advise refill 

## 2015-06-23 ENCOUNTER — Other Ambulatory Visit: Payer: Self-pay | Admitting: Internal Medicine

## 2015-06-27 ENCOUNTER — Other Ambulatory Visit: Payer: Self-pay | Admitting: Internal Medicine

## 2015-06-28 NOTE — Telephone Encounter (Signed)
Okay to refill? Last OV June, next scheduled OV Dec.

## 2015-07-21 ENCOUNTER — Ambulatory Visit (INDEPENDENT_AMBULATORY_CARE_PROVIDER_SITE_OTHER)
Admission: RE | Admit: 2015-07-21 | Discharge: 2015-07-21 | Disposition: A | Discharge status: Home / Self Care | Payer: PPO | Source: Ambulatory Visit | Attending: Internal Medicine | Admitting: Internal Medicine

## 2015-07-21 ENCOUNTER — Ambulatory Visit (INDEPENDENT_AMBULATORY_CARE_PROVIDER_SITE_OTHER): Payer: PPO | Admitting: Internal Medicine

## 2015-07-21 ENCOUNTER — Encounter: Payer: Self-pay | Admitting: Internal Medicine

## 2015-07-21 VITALS — BP 144/90 | HR 82 | Temp 98.3°F | Ht 64.0 in | Wt 181.5 lb

## 2015-07-21 DIAGNOSIS — M25532 Pain in left wrist: Secondary | ICD-10-CM

## 2015-07-21 DIAGNOSIS — Z23 Encounter for immunization: Secondary | ICD-10-CM

## 2015-07-21 DIAGNOSIS — J449 Chronic obstructive pulmonary disease, unspecified: Secondary | ICD-10-CM | POA: Diagnosis not present

## 2015-07-21 DIAGNOSIS — F411 Generalized anxiety disorder: Secondary | ICD-10-CM | POA: Diagnosis not present

## 2015-07-21 DIAGNOSIS — F39 Unspecified mood [affective] disorder: Secondary | ICD-10-CM | POA: Insufficient documentation

## 2015-07-21 DIAGNOSIS — F32A Depression, unspecified: Secondary | ICD-10-CM | POA: Insufficient documentation

## 2015-07-21 DIAGNOSIS — F419 Anxiety disorder, unspecified: Secondary | ICD-10-CM | POA: Insufficient documentation

## 2015-07-21 MED ORDER — LISINOPRIL 20 MG PO TABS: 20.0000 mg | ORAL_TABLET | Freq: Every day | ORAL | Status: DC

## 2015-07-21 MED ORDER — MELOXICAM 15 MG PO TABS: 15.0000 mg | ORAL_TABLET | Freq: Every day | ORAL | Status: DC

## 2015-07-21 MED ORDER — BUSPIRONE HCL 5 MG PO TABS: 5.0000 mg | ORAL_TABLET | Freq: Three times a day (TID) | ORAL | Status: DC

## 2015-07-21 NOTE — Patient Instructions (Addendum)
Please go to [facility] for [employer] of your hand today.  Stop Ibuprofen. Start Meloxicam 15mg  daily as needed for pain.  Wear brace left hand.  Ice hand 10-24min several times daily.   Start Buspar 5mg  three times daily as needed for anxiety.

## 2015-07-21 NOTE — Progress Notes (Signed)
Pre visit review using our clinic review tool, if applicable. No additional management support is needed unless otherwise documented below in the visit note. 

## 2015-07-21 NOTE — Assessment & Plan Note (Signed)
Recent worsening anxiety. No improvement with Alprazolam. Will change to Buspar. Discussed pros and cons of this medication. Follow up in 2 weeks and prn.

## 2015-07-21 NOTE — Assessment & Plan Note (Signed)
Left wrist pain with tenderness and swelling over anatomical snuff box. Will get plain xray for evaluation. Stop Ibuprofen and start Meloxicam. Given wrist splint today with instructions. Recommended icing area several times daily. Follow up after xray.

## 2015-07-21 NOTE — Progress Notes (Addendum)
Subjective:    Patient ID: Tammy Choi, female    DOB: 09-09-1946, 69 y.o.   MRN: [id number]  HPI  69YO female presents for acute visit.  Left hand swelling - Severe pain starting suddenly Sunday with swelling over base of thumb.  No new activities. Taking Ibuprofen 600mg  several times per day with some improvement.  Recently notes more anxiety. Alprazolam not helpful. Notes increased stress with issues with her son.  Continues to smoke. No recent cough, chest tightness. Using Albuterol prn.  Wt Readings from Last 3 Encounters:  07/21/15 181 lb 8 oz (82.328 kg)  04/19/15 181 lb 9.6 oz (82.373 kg)  03/07/15 180 lb 8 oz (81.874 kg)   BP Readings from Last 3 Encounters:  07/21/15 144/90  04/19/15 124/84  03/07/15 125/78    Past Medical History  Diagnosis Date  . HTN (hypertension)    Family History  Problem Relation Age of Onset  . Kidney disease Mother   . Heart disease Father   . Breast cancer Sister    Past Surgical History  Procedure Laterality Date  . Left leg surgery    . Abdominal hysterectomy     Social History   Social History  . Marital Status: Single    Spouse Name: N/A  . Number of Children: N/A  . Years of Education: N/A   Occupational History  . Retired Chiropractor    Social History Main Topics  . Smoking status: Current Every Day Smoker -- 0.25 packs/day    Types: Cigarettes  . Smokeless tobacco: Never Used     Comment: 1-2 Cigs/day  . Alcohol Use: No  . Drug Use: No  . Sexual Activity: Not Asked   Other Topics Concern  . None   Social History Narrative    Review of Systems  Constitutional: Negative for fever, chills, appetite change, fatigue and unexpected weight change.  Eyes: Negative for visual disturbance.  Respiratory: Negative for shortness of breath.   Cardiovascular: Negative for chest pain and leg swelling.  Gastrointestinal: Negative for nausea, vomiting, abdominal pain, diarrhea and constipation.    Musculoskeletal: Positive for myalgias, joint swelling and arthralgias.  Skin: Negative for color change and rash.  Hematological: Negative for adenopathy. Does not bruise/bleed easily.  Psychiatric/Behavioral: Negative for suicidal ideas, sleep disturbance and dysphoric mood. The patient is nervous/anxious.        Objective:    BP 144/90 mmHg  Pulse 82  Temp(Src) 98.3 F (36.8 C) (Oral)  Ht 5\' 4"  (1.626 m)  Wt 181 lb 8 oz (82.328 kg)  BMI 31.14 kg/m2  SpO2 96% Physical Exam  Constitutional: She is oriented to person, place, and time. She appears well-developed and well-nourished. No distress.  HENT:  Head: Normocephalic and atraumatic.  Right Ear: External ear normal.  Left Ear: External ear normal.  Nose: Nose normal.  Mouth/Throat: Oropharynx is clear and moist. No oropharyngeal exudate.  Eyes: Conjunctivae are normal. Pupils are equal, round, and reactive to light. Right eye exhibits no discharge. Left eye exhibits no discharge. No scleral icterus.  Neck: Normal range of motion. Neck supple. No tracheal deviation present. No thyromegaly present.  Cardiovascular: Normal rate, regular rhythm, normal heart sounds and intact distal pulses.  Exam reveals no gallop and no friction rub.   No murmur heard. Pulmonary/Chest: Effort normal and breath sounds normal. No respiratory distress. She has no wheezes. She has no rales. She exhibits no tenderness.  Musculoskeletal: Normal range of motion. She exhibits no edema.  Left hand: She exhibits tenderness, bony tenderness and swelling. She exhibits normal range of motion. Normal sensation noted. Normal strength noted.       Hands: Lymphadenopathy:    She has no cervical adenopathy.  Neurological: She is alert and oriented to person, place, and time. No cranial nerve deficit. She exhibits normal muscle tone. Coordination normal.  Skin: Skin is warm and dry. No rash noted. She is not diaphoretic. No erythema. No pallor.  Psychiatric:  Her speech is normal and behavior is normal. Judgment and thought content normal. Her mood appears anxious. Cognition and memory are normal.          Assessment & Plan:   Problem List Items Addressed This Visit      Unprioritized   Anxiety state    Recent worsening anxiety. No improvement with Alprazolam. Will change to Buspar. Discussed pros and cons of this medication. Follow up in 2 weeks and prn.      Relevant Medications   busPIRone (BUSPAR) 5 MG tablet   COPD (chronic obstructive pulmonary disease) (HCC)    Symptomatically doing well. Exam normal today. Encouraged smoking cessation. Continue prn Albuterol.       Left wrist pain - Primary    Left wrist pain with tenderness and swelling over anatomical snuff box. Will get plain xray for evaluation. Stop Ibuprofen and start Meloxicam. Given wrist splint today with instructions. Recommended icing area several times daily. Follow up after xray.      Relevant Orders   DG Wrist Complete Left       Return in about 2 weeks (around 08/04/2015) for Recheck.

## 2015-07-21 NOTE — Assessment & Plan Note (Signed)
Symptomatically doing well. Exam normal today. Encouraged smoking cessation. Continue prn Albuterol.

## 2015-07-22 ENCOUNTER — Telehealth: Payer: Self-pay | Admitting: Internal Medicine

## 2015-07-22 NOTE — Telephone Encounter (Signed)
Pt called returning your call from today. Thank You!

## 2015-07-22 NOTE — Telephone Encounter (Signed)
Notified pt of lab results 

## 2015-09-06 ENCOUNTER — Telehealth: Payer: Self-pay | Admitting: Internal Medicine

## 2015-09-06 ENCOUNTER — Ambulatory Visit: Payer: PPO | Admitting: Internal Medicine

## 2015-09-06 NOTE — Telephone Encounter (Signed)
FYI, Pt will not be able to make her appt today. Pt states everyone in her home is sick vomiting since yesterday. Appt is still on the schedule. Pt did make another appt. Thank You!

## 2015-10-05 ENCOUNTER — Telehealth: Payer: Self-pay | Admitting: *Deleted

## 2015-10-05 NOTE — Telephone Encounter (Signed)
Left voicemail for patient notifyng them that it is time to schedule annual low dose lung cancer screening CT scan. Instructed patient to call back to verify information prior to the scan being scheduled.  

## 2015-10-06 ENCOUNTER — Encounter: Payer: Self-pay | Admitting: Internal Medicine

## 2015-10-06 ENCOUNTER — Ambulatory Visit (INDEPENDENT_AMBULATORY_CARE_PROVIDER_SITE_OTHER): Payer: PPO | Admitting: Internal Medicine

## 2015-10-06 VITALS — BP 164/82 | HR 78 | Temp 98.8°F | Ht 63.2 in | Wt 183.2 lb

## 2015-10-06 DIAGNOSIS — I10 Essential (primary) hypertension: Secondary | ICD-10-CM

## 2015-10-06 DIAGNOSIS — Z0001 Encounter for general adult medical examination with abnormal findings: Secondary | ICD-10-CM | POA: Diagnosis not present

## 2015-10-06 DIAGNOSIS — Z1239 Encounter for other screening for malignant neoplasm of breast: Secondary | ICD-10-CM | POA: Diagnosis not present

## 2015-10-06 DIAGNOSIS — Z0000 Encounter for general adult medical examination without abnormal findings: Secondary | ICD-10-CM

## 2015-10-06 MED ORDER — LISINOPRIL 40 MG PO TABS: 40.0000 mg | ORAL_TABLET | Freq: Every day | ORAL | Status: DC

## 2015-10-06 NOTE — Patient Instructions (Addendum)
Increase Lisinopril to '40mg'$  daily. Labs next week. Follow up 4 weeks.  Health Maintenance, Female Adopting a healthy lifestyle and getting preventive care can go a long way to promote health and wellness. Talk with your health care provider about what schedule of regular examinations is right for you. This is a good chance for you to check in with your provider about disease prevention and staying healthy. In between checkups, there are plenty of things you can do on your own. Experts have done a lot of research about which lifestyle changes and preventive measures are most likely to keep you healthy. Ask your health care provider for more information. WEIGHT AND DIET  Eat a healthy diet  Be sure to include plenty of vegetables, fruits, low-fat dairy products, and lean protein.  Do not eat a lot of foods high in solid fats, added sugars, or salt.  Get regular exercise. This is one of the most important things you can do for your health.  Most adults should exercise for at least 150 minutes each week. The exercise should increase your heart rate and make you sweat (moderate-intensity exercise).  Most adults should also do strengthening exercises at least twice a week. This is in addition to the moderate-intensity exercise.  Maintain a healthy weight  Body mass index (BMI) is a measurement that can be used to identify possible weight problems. It estimates body fat based on height and weight. Your health care provider can help determine your BMI and help you achieve or maintain a healthy weight.  For females 58 years of age and older:   A BMI below 18.5 is considered underweight.  A BMI of 18.5 to 24.9 is normal.  A BMI of 25 to 29.9 is considered overweight.  A BMI of 30 and above is considered obese.  Watch levels of cholesterol and blood lipids  You should start having your blood tested for lipids and cholesterol at 71 years of age, then have this test every 5 years.  You may  need to have your cholesterol levels checked more often if:  Your lipid or cholesterol levels are high.  You are older than 71 years of age.  You are at high risk for heart disease.  CANCER SCREENING   Lung Cancer  Lung cancer screening is recommended for adults 67-21 years old who are at high risk for lung cancer because of a history of smoking.  A yearly low-dose CT scan of the lungs is recommended for people who:  Currently smoke.  Have quit within the past 15 years.  Have at least a 30-pack-year history of smoking. A pack year is smoking an average of one pack of cigarettes a day for 1 year.  Yearly screening should continue until it has been 15 years since you quit.  Yearly screening should stop if you develop a health problem that would prevent you from having lung cancer treatment.  Breast Cancer  Practice breast self-awareness. This means understanding how your breasts normally appear and feel.  It also means doing regular breast self-exams. Let your health care provider know about any changes, no matter how small.  If you are in your 20s or 30s, you should have a clinical breast exam (CBE) by a health care provider every 1-3 years as part of a regular health exam.  If you are 68 or older, have a CBE every year. Also consider having a breast X-ray (mammogram) every year.  If you have a family history of breast  cancer, talk to your health care provider about genetic screening.  If you are at high risk for breast cancer, talk to your health care provider about having an MRI and a mammogram every year.  Breast cancer gene (BRCA) assessment is recommended for women who have family members with BRCA-related cancers. BRCA-related cancers include:  Breast.  Ovarian.  Tubal.  Peritoneal cancers.  Results of the assessment will determine the need for genetic counseling and BRCA1 and BRCA2 testing. Cervical Cancer Your health care provider may recommend that you be  screened regularly for cancer of the pelvic organs (ovaries, uterus, and vagina). This screening involves a pelvic examination, including checking for microscopic changes to the surface of your cervix (Pap test). You may be encouraged to have this screening done every 3 years, beginning at age 65.  For women ages 49-65, health care providers may recommend pelvic exams and Pap testing every 3 years, or they may recommend the Pap and pelvic exam, combined with testing for human papilloma virus (HPV), every 5 years. Some types of HPV increase your risk of cervical cancer. Testing for HPV may also be done on women of any age with unclear Pap test results.  Other health care providers may not recommend any screening for nonpregnant women who are considered low risk for pelvic cancer and who do not have symptoms. Ask your health care provider if a screening pelvic exam is right for you.  If you have had past treatment for cervical cancer or a condition that could lead to cancer, you need Pap tests and screening for cancer for at least 20 years after your treatment. If Pap tests have been discontinued, your risk factors (such as having a new sexual partner) need to be reassessed to determine if screening should resume. Some women have medical problems that increase the chance of getting cervical cancer. In these cases, your health care provider may recommend more frequent screening and Pap tests. Colorectal Cancer  This type of cancer can be detected and often prevented.  Routine colorectal cancer screening usually begins at 71 years of age and continues through 71 years of age.  Your health care provider may recommend screening at an earlier age if you have risk factors for colon cancer.  Your health care provider may also recommend using home test kits to check for hidden blood in the stool.  A small camera at the end of a tube can be used to examine your colon directly (sigmoidoscopy or colonoscopy).  This is done to check for the earliest forms of colorectal cancer.  Routine screening usually begins at age 92.  Direct examination of the colon should be repeated every 5-10 years through 71 years of age. However, you may need to be screened more often if early forms of precancerous polyps or small growths are found. Skin Cancer  Check your skin from head to toe regularly.  Tell your health care provider about any new moles or changes in moles, especially if there is a change in a mole's shape or color.  Also tell your health care provider if you have a mole that is larger than the size of a pencil eraser.  Always use sunscreen. Apply sunscreen liberally and repeatedly throughout the day.  Protect yourself by wearing long sleeves, pants, a wide-brimmed hat, and sunglasses whenever you are outside. HEART DISEASE, DIABETES, AND HIGH BLOOD PRESSURE   High blood pressure causes heart disease and increases the risk of stroke. High blood pressure is more likely  to develop in:  People who have blood pressure in the high end of the normal range (130-139/85-89 mm Hg).  People who are overweight or obese.  People who are African American.  If you are 74-6 years of age, have your blood pressure checked every 3-5 years. If you are 79 years of age or older, have your blood pressure checked every year. You should have your blood pressure measured twice--once when you are at a hospital or clinic, and once when you are not at a hospital or clinic. Record the average of the two measurements. To check your blood pressure when you are not at a hospital or clinic, you can use:  An automated blood pressure machine at a pharmacy.  A home blood pressure monitor.  If you are between 88 years and 4 years old, ask your health care provider if you should take aspirin to prevent strokes.  Have regular diabetes screenings. This involves taking a blood sample to check your fasting blood sugar level.  If you  are at a normal weight and have a low risk for diabetes, have this test once every three years after 71 years of age.  If you are overweight and have a high risk for diabetes, consider being tested at a younger age or more often. PREVENTING INFECTION  Hepatitis B  If you have a higher risk for hepatitis B, you should be screened for this virus. You are considered at high risk for hepatitis B if:  You were born in a country where hepatitis B is common. Ask your health care provider which countries are considered high risk.  Your parents were born in a high-risk country, and you have not been immunized against hepatitis B (hepatitis B vaccine).  You have HIV or AIDS.  You use needles to inject street drugs.  You live with someone who has hepatitis B.  You have had sex with someone who has hepatitis B.  You get hemodialysis treatment.  You take certain medicines for conditions, including cancer, organ transplantation, and autoimmune conditions. Hepatitis C  Blood testing is recommended for:  Everyone born from [birth year] through 1966.  Anyone with known risk factors for hepatitis C. Sexually transmitted infections (STIs)  You should be screened for sexually transmitted infections (STIs) including gonorrhea and chlamydia if:  You are sexually active and are younger than 71 years of age.  You are older than 71 years of age and your health care provider tells you that you are at risk for this type of infection.  Your sexual activity has changed since you were last screened and you are at an increased risk for chlamydia or gonorrhea. Ask your health care provider if you are at risk.  If you do not have HIV, but are at risk, it may be recommended that you take a prescription medicine daily to prevent HIV infection. This is called pre-exposure prophylaxis (PrEP). You are considered at risk if:  You are sexually active and do not regularly use condoms or know the HIV status of your  partner(s).  You take drugs by injection.  You are sexually active with a partner who has HIV. Talk with your health care provider about whether you are at high risk of being infected with HIV. If you choose to begin PrEP, you should first be tested for HIV. You should then be tested every 3 months for as long as you are taking PrEP.  PREGNANCY   If you are premenopausal and you may become  pregnant, ask your health care provider about preconception counseling.  If you may become pregnant, take 400 to 800 micrograms (mcg) of folic acid every day.  If you want to prevent pregnancy, talk to your health care provider about birth control (contraception). OSTEOPOROSIS AND MENOPAUSE   Osteoporosis is a disease in which the bones lose minerals and strength with aging. This can result in serious bone fractures. Your risk for osteoporosis can be identified using a bone density scan.  If you are 27 years of age or older, or if you are at risk for osteoporosis and fractures, ask your health care provider if you should be screened.  Ask your health care provider whether you should take a calcium or vitamin D supplement to lower your risk for osteoporosis.  Menopause may have certain physical symptoms and risks.  Hormone replacement therapy may reduce some of these symptoms and risks. Talk to your health care provider about whether hormone replacement therapy is right for you.  HOME CARE INSTRUCTIONS   Schedule regular health, dental, and eye exams.  Stay current with your immunizations.   Do not use any tobacco products including cigarettes, chewing tobacco, or electronic cigarettes.  If you are pregnant, do not drink alcohol.  If you are breastfeeding, limit how much and how often you drink alcohol.  Limit alcohol intake to no more than 1 drink per day for nonpregnant women. One drink equals 12 ounces of beer, 5 ounces of wine, or 1 ounces of hard liquor.  Do not use street drugs.  Do  not share needles.  Ask your health care provider for help if you need support or information about quitting drugs.  Tell your health care provider if you often feel depressed.  Tell your health care provider if you have ever been abused or do not feel safe at home.   This information is not intended to replace advice given to you by your health care provider. Make sure you discuss any questions you have with your health care provider.   Document Released: 04/02/2011 Document Revised: 10/08/2014 Document Reviewed: 08/19/2013 Elsevier Interactive Patient Education [employer].

## 2015-10-06 NOTE — Assessment & Plan Note (Signed)
General physical exam including breast exam normal today. PAP and pelvic deferred given she is s/p hysterectomy.

## 2015-10-06 NOTE — Progress Notes (Signed)
Pre visit review using our clinic review tool, if applicable. No additional management support is needed unless otherwise documented below in the visit note. 

## 2015-10-06 NOTE — Assessment & Plan Note (Signed)
Appropriate screening performed.  Mammogram ordered and scheduled. CT chest to screen for lung cancer in a smoker due, encouraged her to follow up with nurse navigator. Colonoscopy is UTD. Immunizations are UTD. Labs next week. Encouraged healthy diet and exercise.
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
## 2015-10-06 NOTE — Assessment & Plan Note (Signed)
Mammogram scheduled.

## 2015-10-06 NOTE — Assessment & Plan Note (Signed)
BP Readings from Last 3 Encounters:  10/06/15 164/82  07/21/15 144/90  04/19/15 124/84   BP elevated. Will increase Lisinopril to 40mg  daily. Check Cr and K in 1 week. Follow up in 4 weeks and prn.

## 2015-10-13 ENCOUNTER — Other Ambulatory Visit (INDEPENDENT_AMBULATORY_CARE_PROVIDER_SITE_OTHER): Payer: PPO

## 2015-10-13 DIAGNOSIS — I10 Essential (primary) hypertension: Secondary | ICD-10-CM

## 2015-10-13 LAB — COMPREHENSIVE METABOLIC PANEL
ALT: 15 U/L (ref 0–35)
AST: 16 U/L (ref 0–37)
Albumin: 4.4 g/dL (ref 3.5–5.2)
Alkaline Phosphatase: 84 U/L (ref 39–117)
BUN: 19 mg/dL (ref 6–23)
CHLORIDE: 101 meq/L (ref 96–112)
CO2: 33 meq/L — AB (ref 19–32)
CREATININE: 0.78 mg/dL (ref 0.40–1.20)
Calcium: 9.4 mg/dL (ref 8.4–10.5)
GFR: 77.55 mL/min (ref >60.00)
GLUCOSE: 100 mg/dL — AB (ref 70–99)
POTASSIUM: 5 meq/L (ref 3.5–5.1)
SODIUM: 139 meq/L (ref 135–145)
Total Bilirubin: 0.4 mg/dL (ref 0.2–1.2)
Total Protein: 6.6 g/dL (ref 6.0–8.3)

## 2015-10-13 LAB — MICROALBUMIN / CREATININE URINE RATIO
CREATININE, U: 41 mg/dL
Microalb Creat Ratio: 1.7 mg/g (ref 0.0–30.0)

## 2015-10-13 LAB — LIPID PANEL
CHOL/HDL RATIO: 2
Cholesterol: 142 mg/dL (ref 0–200)
HDL: 59.1 mg/dL (ref >39.00)
LDL CALC: 62 mg/dL (ref 0–99)
NONHDL: 82.93
Triglycerides: 103 mg/dL (ref 0.0–149.0)
VLDL: 20.6 mg/dL (ref 0.0–40.0)

## 2015-10-20 ENCOUNTER — Ambulatory Visit: Payer: PPO

## 2015-10-25 ENCOUNTER — Ambulatory Visit
Admission: RE | Admit: 2015-10-25 | Discharge: 2015-10-25 | Disposition: A | Discharge status: Home / Self Care | Payer: PPO | Source: Ambulatory Visit | Attending: Internal Medicine | Admitting: Internal Medicine

## 2015-10-25 DIAGNOSIS — Z1231 Encounter for screening mammogram for malignant neoplasm of breast: Secondary | ICD-10-CM | POA: Insufficient documentation

## 2015-10-25 DIAGNOSIS — Z1239 Encounter for other screening for malignant neoplasm of breast: Secondary | ICD-10-CM

## 2015-10-25 LAB — HM MAMMOGRAPHY

## 2015-10-26 ENCOUNTER — Other Ambulatory Visit: Payer: Self-pay | Admitting: Internal Medicine

## 2015-10-26 DIAGNOSIS — R928 Other abnormal and inconclusive findings on diagnostic imaging of breast: Secondary | ICD-10-CM

## 2015-11-03 ENCOUNTER — Ambulatory Visit (INDEPENDENT_AMBULATORY_CARE_PROVIDER_SITE_OTHER): Payer: PPO

## 2015-11-03 VITALS — BP 118/78 | HR 88 | Resp 18

## 2015-11-03 DIAGNOSIS — I10 Essential (primary) hypertension: Secondary | ICD-10-CM

## 2015-11-03 NOTE — Progress Notes (Signed)
Please make sure she has follow up in 3 months or so.

## 2015-11-03 NOTE — Progress Notes (Signed)
Patient came in for BP check.  Patient had increased her lisinoprill after last appt.  No issues since that increase.  Checked BP in bilateral upper extremities, see vitals for details.   Please advise.

## 2015-11-03 NOTE — Progress Notes (Signed)
BP well controlled Continue current medication 

## 2015-11-04 ENCOUNTER — Ambulatory Visit
Admission: RE | Admit: 2015-11-04 | Discharge: 2015-11-04 | Disposition: A | Discharge status: Home / Self Care | Payer: PPO | Source: Ambulatory Visit | Attending: Internal Medicine | Admitting: Internal Medicine

## 2015-11-04 DIAGNOSIS — R928 Other abnormal and inconclusive findings on diagnostic imaging of breast: Secondary | ICD-10-CM | POA: Diagnosis not present

## 2016-01-18 ENCOUNTER — Ambulatory Visit: Payer: PPO | Admitting: Family Medicine

## 2016-01-20 ENCOUNTER — Inpatient Hospital Stay (HOSPITAL_COMMUNITY)
Admit: 2016-01-20 | Discharge: 2016-01-20 | Disposition: A | Discharge status: Home / Self Care | Payer: PPO | Attending: Internal Medicine | Admitting: Internal Medicine

## 2016-01-20 ENCOUNTER — Emergency Department: Payer: PPO

## 2016-01-20 ENCOUNTER — Inpatient Hospital Stay
Admission: EM | Admit: 2016-01-20 | Discharge: 2016-01-24 | DRG: 190 | Disposition: A | Discharge status: Home / Self Care | Payer: PPO | Attending: Internal Medicine | Admitting: Internal Medicine

## 2016-01-20 ENCOUNTER — Encounter: Payer: Self-pay | Admitting: Family Medicine

## 2016-01-20 ENCOUNTER — Ambulatory Visit (INDEPENDENT_AMBULATORY_CARE_PROVIDER_SITE_OTHER): Payer: PPO | Admitting: Family Medicine

## 2016-01-20 VITALS — BP 128/70 | HR 94 | Temp 98.4°F | Wt 179.8 lb

## 2016-01-20 DIAGNOSIS — Z880 Allergy status to penicillin: Secondary | ICD-10-CM | POA: Diagnosis not present

## 2016-01-20 DIAGNOSIS — Z79899 Other long term (current) drug therapy: Secondary | ICD-10-CM

## 2016-01-20 DIAGNOSIS — I248 Other forms of acute ischemic heart disease: Secondary | ICD-10-CM | POA: Diagnosis not present

## 2016-01-20 DIAGNOSIS — T380X5A Adverse effect of glucocorticoids and synthetic analogues, initial encounter: Secondary | ICD-10-CM | POA: Diagnosis not present

## 2016-01-20 DIAGNOSIS — Z8249 Family history of ischemic heart disease and other diseases of the circulatory system: Secondary | ICD-10-CM

## 2016-01-20 DIAGNOSIS — Y92230 Patient room in hospital as the place of occurrence of the external cause: Secondary | ICD-10-CM | POA: Diagnosis not present

## 2016-01-20 DIAGNOSIS — F329 Major depressive disorder, single episode, unspecified: Secondary | ICD-10-CM | POA: Diagnosis present

## 2016-01-20 DIAGNOSIS — R0600 Dyspnea, unspecified: Secondary | ICD-10-CM

## 2016-01-20 DIAGNOSIS — Z87891 Personal history of nicotine dependence: Secondary | ICD-10-CM | POA: Diagnosis not present

## 2016-01-20 DIAGNOSIS — R0902 Hypoxemia: Secondary | ICD-10-CM | POA: Diagnosis not present

## 2016-01-20 DIAGNOSIS — J440 Chronic obstructive pulmonary disease with acute lower respiratory infection: Secondary | ICD-10-CM | POA: Diagnosis not present

## 2016-01-20 DIAGNOSIS — Z841 Family history of disorders of kidney and ureter: Secondary | ICD-10-CM | POA: Diagnosis not present

## 2016-01-20 DIAGNOSIS — R0609 Other forms of dyspnea: Secondary | ICD-10-CM | POA: Diagnosis not present

## 2016-01-20 DIAGNOSIS — I10 Essential (primary) hypertension: Secondary | ICD-10-CM | POA: Diagnosis present

## 2016-01-20 DIAGNOSIS — R05 Cough: Secondary | ICD-10-CM | POA: Diagnosis not present

## 2016-01-20 DIAGNOSIS — J189 Pneumonia, unspecified organism: Secondary | ICD-10-CM | POA: Diagnosis not present

## 2016-01-20 DIAGNOSIS — Z803 Family history of malignant neoplasm of breast: Secondary | ICD-10-CM

## 2016-01-20 DIAGNOSIS — G4700 Insomnia, unspecified: Secondary | ICD-10-CM | POA: Diagnosis not present

## 2016-01-20 DIAGNOSIS — E785 Hyperlipidemia, unspecified: Secondary | ICD-10-CM | POA: Diagnosis present

## 2016-01-20 DIAGNOSIS — J441 Chronic obstructive pulmonary disease with (acute) exacerbation: Secondary | ICD-10-CM | POA: Diagnosis present

## 2016-01-20 DIAGNOSIS — F419 Anxiety disorder, unspecified: Secondary | ICD-10-CM | POA: Diagnosis present

## 2016-01-20 DIAGNOSIS — R748 Abnormal levels of other serum enzymes: Secondary | ICD-10-CM | POA: Diagnosis not present

## 2016-01-20 HISTORY — DX: Hyperlipidemia, unspecified: E78.5

## 2016-01-20 HISTORY — DX: Chronic obstructive pulmonary disease, unspecified: J44.9

## 2016-01-20 LAB — BASIC METABOLIC PANEL
Anion gap: 9 (ref 5–15)
BUN: 23 mg/dL — ABNORMAL HIGH (ref 6–20)
CHLORIDE: 98 mmol/L — AB (ref 101–111)
CO2: 30 mmol/L (ref 22–32)
CREATININE: 0.84 mg/dL (ref 0.44–1.00)
Calcium: 9 mg/dL (ref 8.9–10.3)
GFR calc non Af Amer: >60 mL/min (ref >60)
Glucose, Bld: 121 mg/dL — ABNORMAL HIGH (ref 65–99)
POTASSIUM: 3.7 mmol/L (ref 3.5–5.1)
Sodium: 137 mmol/L (ref 135–145)

## 2016-01-20 LAB — CBC WITH DIFFERENTIAL/PLATELET
BASOS ABS: 0 10*3/uL (ref 0–0.1)
Basophils Relative: 0 %
EOS PCT: 0 %
Eosinophils Absolute: 0 10*3/uL (ref 0–0.7)
HEMATOCRIT: 37.9 % (ref 35.0–47.0)
HEMOGLOBIN: 12.4 g/dL (ref 12.0–16.0)
LYMPHS ABS: 1.9 10*3/uL (ref 1.0–3.6)
LYMPHS PCT: 13 %
MCH: 27.8 pg (ref 26.0–34.0)
MCHC: 32.6 g/dL (ref 32.0–36.0)
MCV: 85.2 fL (ref 80.0–100.0)
Monocytes Absolute: 0.5 10*3/uL (ref 0.2–0.9)
Monocytes Relative: 3 %
NEUTROS ABS: 11.9 10*3/uL — AB (ref 1.4–6.5)
NEUTROS PCT: 84 %
Platelets: 346 10*3/uL (ref 150–440)
RBC: 4.45 MIL/uL (ref 3.80–5.20)
RDW: 13.9 % (ref 11.5–14.5)
WBC: 14.3 10*3/uL — AB (ref 3.6–11.0)

## 2016-01-20 LAB — TROPONIN I
TROPONIN I: 0.04 ng/mL — AB (ref <0.031)
TROPONIN I: 0.03 ng/mL (ref <0.031)

## 2016-01-20 LAB — BRAIN NATRIURETIC PEPTIDE: B Natriuretic Peptide: 81 pg/mL (ref 0.0–100.0)

## 2016-01-20 MED ORDER — BUSPIRONE HCL 10 MG PO TABS
5.0000 mg | ORAL_TABLET | Freq: Three times a day (TID) | ORAL | Status: DC
  Administered 2016-01-20 – 2016-01-23 (×4): 5 mg via ORAL
  Filled 2016-01-20 (×5): qty 1

## 2016-01-20 MED ORDER — VITAMIN D 1000 UNITS PO TABS
2000.0000 [IU] | ORAL_TABLET | Freq: Every day | ORAL | Status: DC
  Administered 2016-01-21 – 2016-01-24 (×4): 2000 [IU] via ORAL
  Filled 2016-01-20 (×4): qty 2

## 2016-01-20 MED ORDER — LISINOPRIL 20 MG PO TABS
40.0000 mg | ORAL_TABLET | Freq: Every day | ORAL | Status: DC
  Administered 2016-01-20 – 2016-01-24 (×5): 40 mg via ORAL
  Filled 2016-01-20 (×5): qty 2

## 2016-01-20 MED ORDER — ACETAMINOPHEN 325 MG PO TABS
650.0000 mg | ORAL_TABLET | Freq: Four times a day (QID) | ORAL | Status: DC | PRN
  Administered 2016-01-20: 650 mg via ORAL
  Filled 2016-01-20: qty 2

## 2016-01-20 MED ORDER — FLUOXETINE HCL 20 MG PO CAPS
40.0000 mg | ORAL_CAPSULE | Freq: Every day | ORAL | Status: DC
  Administered 2016-01-20 – 2016-01-24 (×5): 40 mg via ORAL
  Filled 2016-01-20 (×5): qty 2

## 2016-01-20 MED ORDER — ACETAMINOPHEN 650 MG RE SUPP: 650.0000 mg | Freq: Four times a day (QID) | RECTAL | Status: DC | PRN

## 2016-01-20 MED ORDER — IPRATROPIUM-ALBUTEROL 0.5-2.5 (3) MG/3ML IN SOLN
3.0000 mL | Freq: Once | RESPIRATORY_TRACT | Status: AC
  Administered 2016-01-20: 3 mL via RESPIRATORY_TRACT
  Filled 2016-01-20: qty 3

## 2016-01-20 MED ORDER — IPRATROPIUM-ALBUTEROL 0.5-2.5 (3) MG/3ML IN SOLN
3.0000 mL | RESPIRATORY_TRACT | Status: AC
  Administered 2016-01-20: 3 mL via RESPIRATORY_TRACT

## 2016-01-20 MED ORDER — IPRATROPIUM-ALBUTEROL 0.5-2.5 (3) MG/3ML IN SOLN
3.0000 mL | RESPIRATORY_TRACT | Status: DC
  Administered 2016-01-20 – 2016-01-22 (×11): 3 mL via RESPIRATORY_TRACT
  Filled 2016-01-20 (×11): qty 3

## 2016-01-20 MED ORDER — IPRATROPIUM-ALBUTEROL 0.5-2.5 (3) MG/3ML IN SOLN
RESPIRATORY_TRACT | Status: AC
  Filled 2016-01-20: qty 3

## 2016-01-20 MED ORDER — ADULT MULTIVITAMIN W/MINERALS CH
1.0000 | ORAL_TABLET | Freq: Every day | ORAL | Status: DC
  Administered 2016-01-21 – 2016-01-24 (×4): 1 via ORAL
  Filled 2016-01-20 (×4): qty 1

## 2016-01-20 MED ORDER — SODIUM CHLORIDE 0.9 % IV SOLN
INTRAVENOUS | Status: AC
  Administered 2016-01-20 – 2016-01-21 (×2): via INTRAVENOUS

## 2016-01-20 MED ORDER — ONDANSETRON HCL 4 MG PO TABS: 4.0000 mg | ORAL_TABLET | Freq: Four times a day (QID) | ORAL | Status: DC | PRN

## 2016-01-20 MED ORDER — POLYETHYLENE GLYCOL 3350 17 G PO PACK
17.0000 g | PACK | Freq: Every day | ORAL | Status: DC | PRN
  Administered 2016-01-22: 17 g via ORAL
  Filled 2016-01-20: qty 1

## 2016-01-20 MED ORDER — ONDANSETRON HCL 4 MG/2ML IJ SOLN: 4.0000 mg | Freq: Four times a day (QID) | INTRAMUSCULAR | Status: DC | PRN

## 2016-01-20 MED ORDER — ENOXAPARIN SODIUM 40 MG/0.4ML ~~LOC~~ SOLN
40.0000 mg | SUBCUTANEOUS | Status: DC
Start: 2016-01-20 — End: 2016-01-24
  Administered 2016-01-20 – 2016-01-23 (×4): 40 mg via SUBCUTANEOUS
  Filled 2016-01-20 (×4): qty 0.4

## 2016-01-20 MED ORDER — METHYLPREDNISOLONE SODIUM SUCC 40 MG IJ SOLR
40.0000 mg | Freq: Four times a day (QID) | INTRAMUSCULAR | Status: DC
  Administered 2016-01-20 – 2016-01-21 (×3): 40 mg via INTRAVENOUS
  Filled 2016-01-20 (×3): qty 1

## 2016-01-20 MED ORDER — ATORVASTATIN CALCIUM 20 MG PO TABS
20.0000 mg | ORAL_TABLET | Freq: Every day | ORAL | Status: DC
  Administered 2016-01-20 – 2016-01-23 (×4): 20 mg via ORAL
  Filled 2016-01-20 (×4): qty 1

## 2016-01-20 MED ORDER — LEVOFLOXACIN IN D5W 750 MG/150ML IV SOLN
750.0000 mg | Freq: Once | INTRAVENOUS | Status: DC
  Filled 2016-01-20: qty 150

## 2016-01-20 MED ORDER — METHYLPREDNISOLONE SODIUM SUCC 125 MG IJ SOLR
125.0000 mg | Freq: Once | INTRAMUSCULAR | Status: AC
  Administered 2016-01-20: 125 mg via INTRAVENOUS
  Filled 2016-01-20: qty 2

## 2016-01-20 MED ORDER — LEVOFLOXACIN IN D5W 500 MG/100ML IV SOLN
500.0000 mg | INTRAVENOUS | Status: DC
  Administered 2016-01-21: 500 mg via INTRAVENOUS
  Filled 2016-01-20 (×2): qty 100

## 2016-01-20 MED ORDER — GUAIFENESIN-CODEINE 100-10 MG/5ML PO SOLN
10.0000 mL | Freq: Four times a day (QID) | ORAL | Status: DC | PRN
  Administered 2016-01-20 – 2016-01-23 (×6): 10 mL via ORAL
  Filled 2016-01-20 (×6): qty 10

## 2016-01-20 MED ORDER — DOCUSATE SODIUM 100 MG PO CAPS
100.0000 mg | ORAL_CAPSULE | Freq: Two times a day (BID) | ORAL | Status: DC
  Administered 2016-01-20 – 2016-01-22 (×3): 100 mg via ORAL
  Filled 2016-01-20 (×4): qty 1

## 2016-01-20 NOTE — H&P (Signed)
[place] [facility] [place] NAME: Tammy Choi    MR#:  [id number]  DATE OF BIRTH:  1946/08/01  DATE OF ADMISSION:  01/20/2016  PRIMARY CARE PHYSICIAN: Rica Mast, MD   REQUESTING/REFERRING PHYSICIAN: Dr. Lisa Roca  CHIEF COMPLAINT:   Chief Complaint  Patient presents with  . Shortness of Breath    HISTORY OF PRESENT ILLNESS:  Tammy Choi  is a 70 y.o. female with a known history of Hypertension, COPD not on home oxygen, hyperlipidemia sent in from PCP office secondary to hypoxia and worsening cough and respiratory symptoms. Patient said that her symptoms started about 2 weeks ago with upper respiratory tract infection, sinus symptoms with coughing. Over the last week she's been having chills, worsening cough with productive yellowish green mucus. Her dyspnea on exertion has worsened, also getting orthopneic and so went to see her PCP today. Exam reveals some rhonchi at the bases and she was supposed to be sent home on oral antibiotic, however her oxygen saturations were low with sats 88% on room air and so was sent to the emergency room. Chest x-ray here revealed left basilar infiltrate and COPD exacerbation on exam. So she is being admitted for the same. Patient also complains of nausea, chills and malaise at home. No chest pain or fevers.  PAST MEDICAL HISTORY:   Past Medical History  Diagnosis Date  . HTN (hypertension)   . COPD (chronic obstructive pulmonary disease) (H[facility])     Not on home o2  . Hyperlipemia     PAST SURGICAL HISTORY:   Past Surgical History  Procedure Laterality Date  . Left leg surgery    . Abdominal hysterectomy    . Breast biopsy Left 04/30/2013    neg  . Tonsillectomy      SOCIAL HISTORY:   Social History  Substance Use Topics  . Smoking status: Former Smoker -- 0.25 packs/day    Quit date: 12/20/2015  . Smokeless tobacco: Never Used     Comment: 1-2 Cigs/day  . Alcohol  Use: No    FAMILY HISTORY:   Family History  Problem Relation Age of Onset  . Kidney disease Mother   . Heart disease Father   . Breast cancer Sister 58    DRUG ALLERGIES:   Allergies  Allergen Reactions  . Penicillins Anaphylaxis and Other (See Comments)    Has patient had a PCN reaction causing immediate rash, facial/tongue/throat swelling, SOB or lightheadedness with hypotension: Yes Has patient had a PCN reaction causing severe rash involving mucus membranes or skin necrosis: No Has patient had a PCN reaction that required hospitalization No Has patient had a PCN reaction occurring within the last 10 years: No If all of the above answers are "NO", then may proceed with Cephalosporin use.    REVIEW OF SYSTEMS:   Review of Systems  Constitutional: Positive for chills and malaise/fatigue. Negative for fever and weight loss.  HENT: Positive for congestion. Negative for ear discharge, ear pain, nosebleeds and tinnitus.   Eyes: Negative for blurred vision, double vision and photophobia.  Respiratory: Positive for cough, shortness of breath and wheezing. Negative for hemoptysis.   Cardiovascular: Positive for orthopnea. Negative for chest pain, palpitations and leg swelling.  Gastrointestinal: Positive for nausea. Negative for heartburn, vomiting, abdominal pain, diarrhea, constipation and melena.  Genitourinary: Negative for dysuria, urgency, frequency and hematuria.  Musculoskeletal: Negative for myalgias, back pain and neck pain.  Skin: Negative for rash.  Neurological: Negative for  dizziness, tremors, sensory change, speech change, focal weakness and headaches.  Endo/Heme/Allergies: Does not bruise/bleed easily.  Psychiatric/Behavioral: Negative for depression.    MEDICATIONS AT HOME:   Prior to Admission medications   Medication Sig Start Date End Date Taking? Authorizing Provider  albuterol (PROAIR HFA) 108 (90 BASE) MCG/ACT inhaler Inhale 2 puffs into the lungs every  6 (six) hours as needed for wheezing or shortness of breath. 09/06/14  Yes Rubbie Battiest, NP  atorvastatin (LIPITOR) 20 MG tablet Take 20 mg by mouth at bedtime.   Yes Historical Provider, MD  busPIRone (BUSPAR) 5 MG tablet Take 1 tablet (5 mg total) by mouth 3 (three) times daily. 07/21/15  Yes Jackolyn Confer, MD  cholecalciferol (VITAMIN D) 1000 UNITS tablet Take 2,000 Units by mouth daily.   Yes Historical Provider, MD  FLUoxetine (PROZAC) 40 MG capsule Take 1 capsule (40 mg total) by mouth daily. 03/07/15  Yes Jackolyn Confer, MD  lisinopril (PRINIVIL,ZESTRIL) 40 MG tablet Take 1 tablet (40 mg total) by mouth daily. 10/06/15  Yes Jackolyn Confer, MD  Multiple Vitamins-Minerals (MULTIVITAMIN WITH MINERALS) tablet Take 1 tablet by mouth daily.   Yes Historical Provider, MD      VITAL SIGNS:  Blood pressure 118/64, pulse 87, temperature 98.2 F (36.8 C), temperature source Oral, resp. rate 26, height 5\' 6"  (1.676 m), weight 81.194 kg (179 lb), SpO2 94 %.  PHYSICAL EXAMINATION:   Physical Exam  GENERAL:  70 y.o.-year-old patient lying in the bed with no acute distress.  EYES: Pupils equal, round, reactive to light and accommodation. No scleral icterus. Extraocular muscles intact.  HEENT: Head atraumatic, normocephalic. Oropharynx and nasopharynx clear.  NECK:  Supple, no jugular venous distention. No thyroid enlargement, no tenderness.  LUNGS: Decreased bibasilar breath sounds. Scattered wheezing and basilar rhonchi heard. No rales or crepitations. No use of accessory muscles of respiration.  CARDIOVASCULAR: S1, S2 normal. No murmurs, rubs, or gallops.  ABDOMEN: Soft, nontender, nondistended. Bowel sounds present. No organomegaly or mass.  EXTREMITIES: No pedal edema, cyanosis, or clubbing.  NEUROLOGIC: Cranial nerves II through XII are intact. Muscle strength 5/5 in all extremities. Sensation intact. Gait not checked.  PSYCHIATRIC: The patient is alert and oriented x 3.  SKIN: No  obvious rash, lesion, or ulcer.   LABORATORY PANEL:   CBC  Recent Labs Lab 01/20/16 1301  WBC 14.3*  HGB 12.4  HCT 37.9  PLT 346   ------------------------------------------------------------------------------------------------------------------  Chemistries   Recent Labs Lab 01/20/16 1301  NA 137  K 3.7  CL 98*  CO2 30  GLUCOSE 121*  BUN 23*  CREATININE 0.84  CALCIUM 9.0   ------------------------------------------------------------------------------------------------------------------  Cardiac Enzymes  Recent Labs Lab 01/20/16 1301  TROPONINI 0.04*   ------------------------------------------------------------------------------------------------------------------  RADIOLOGY:  Dg Chest Port 1 View  01/20/2016  CLINICAL DATA:  Cough and chest congestion for several days. Worsening hypoxia. EXAM: PORTABLE CHEST 1 VIEW COMPARISON:  07/24/2012 FINDINGS: Heart size remains normal. Pulmonary hyperinflation again seen. Increased mild asymmetric opacity is seen in the left lower lung along the left heart border, and mild or early pneumonia cannot be excluded. No evidence of pleural effusion. IMPRESSION: New mild asymmetric opacity in left lower lung, suspicious for mild or early pneumonia. Followup PA and lateral chest X-ray is recommended in 3-4 weeks following trial of antibiotic therapy to ensure resolution. Electronically Signed   By: Earle Gell M.D.   On: 01/20/2016 13:53    EKG:   Orders placed or performed during  the hospital encounter of 01/20/16  . ED EKG  . ED EKG  . EKG 12-Lead  . EKG 12-Lead    IMPRESSION AND PLAN:   Tammy Choi  is a 70 y.o. female with a known history of Hypertension, COPD not on home oxygen, hyperlipidemia sent in from PCP office secondary to hypoxia and worsening cough and respiratory symptoms.  #1 COPD exacerbation- o2 support - solumedrol and duonebs  #2 community-acquired pneumonia- f/u Blood cultures - started on  levaquin - monitor o2 sats. O2 support, cough meds  #3 hypertension- continue lisinopril  #4 hyperlipidemia-continue statin  #5 elevated troponin-likely demand ischemia. Recycle troponins. No active cardiac symptoms. No EKG changes.  #6 DVT prophylaxis- started on Lovenox  #7 depression and anxiety-continue home medications. Patient is on Prozac and BuSpar   All the records are reviewed and case discussed with ED provider. Management plans discussed with the patient, family and they are in agreement.  CODE STATUS: Full code.  TOTAL TIME TAKING CARE OF THIS PATIENT: 50 minutes.    Gladstone Lighter M.D on 01/20/2016 at 3:37 PM  Between 7am to 6pm - Pager - [phone]  After 6pm go to [email or web address] - password [email or web address] [facility]  Office  [phone]  CC: Primary care physician; Rica Mast, MD

## 2016-01-20 NOTE — ED Provider Notes (Signed)
[facility] Emergency Department Provider Note   ____________________________________________  Time seen: Approximately 1 PM I have reviewed the triage vital signs and the triage nursing note.  HISTORY  Chief Complaint Shortness of Breath   Historian Patient, and daughter  HPI Tammy Choi is a 70 y.o. female with a history of COPD, and a history of smoking and states that she quit about 1 month ago, is here for shortness of breath for about the past 2 weeks. Symptoms of wheezing. She did run out of her albuterol inhaler. Today she went to her primary care physician's office for visit was found to be hypoxic to 87% on room air. She denies productive sputum. She denies fever. She denies chest pain. She does report dyspnea is worse with exertion. She states her lungs had never been quite this bad in the past. Never been hospitalized for COPD.    Past Medical History  Diagnosis Date  . HTN (hypertension)   . COPD (chronic obstructive pulmonary disease) ([place])   . Hyperlipemia     Patient Active Problem List   Diagnosis Date Noted  . Screening for breast cancer 10/06/2015  . Routine general medical examination at a health care facility 10/06/2015  . Left wrist pain 07/21/2015  . Anxiety state 07/21/2015  . Obesity (BMI 30-39.9) 07/30/2014  . Dyspnea on exertion 03/30/2014  . Tobacco use disorder 07/27/2013  . Encounter for screening colonoscopy 02/13/2013  . COPD (chronic obstructive pulmonary disease) ([place]) 01/21/2013  . Breast mass, left 09/05/2012  . Medicare annual wellness visit, subsequent 07/16/2012  . Chronic back pain 01/15/2012  . Depression 01/15/2012  . Hyperlipidemia 01/15/2012  . Hypertension 08/07/2011    Past Surgical History  Procedure Laterality Date  . Left leg surgery    . Abdominal hysterectomy    . Breast biopsy Left 04/30/2013    neg    Current Outpatient Rx  Name  Route  Sig  Dispense  Refill  . albuterol (PROAIR  HFA) 108 (90 BASE) MCG/ACT inhaler   Inhalation   Inhale 2 puffs into the lungs every 6 (six) hours as needed for wheezing or shortness of breath.   1 Inhaler   2   . atorvastatin (LIPITOR) 20 MG tablet   Oral   Take 20 mg by mouth at bedtime.         . busPIRone (BUSPAR) 5 MG tablet   Oral   Take 1 tablet (5 mg total) by mouth 3 (three) times daily.   90 tablet   1   . cholecalciferol (VITAMIN D) 1000 UNITS tablet   Oral   Take 2,000 Units by mouth daily.         Marland Kitchen FLUoxetine (PROZAC) 40 MG capsule   Oral   Take 1 capsule (40 mg total) by mouth daily.   90 capsule   3   . lisinopril (PRINIVIL,ZESTRIL) 40 MG tablet   Oral   Take 1 tablet (40 mg total) by mouth daily.   90 tablet   3   . Multiple Vitamins-Minerals (MULTIVITAMIN WITH MINERALS) tablet   Oral   Take 1 tablet by mouth daily.           Allergies Penicillins  Family History  Problem Relation Age of Onset  . Kidney disease Mother   . Heart disease Father   . Breast cancer Sister 70    Social History Social History  Substance Use Topics  . Smoking status: Former Smoker -- 0.25 packs/day  Quit date: 12/20/2015  . Smokeless tobacco: Never Used     Comment: 1-2 Cigs/day  . Alcohol Use: No    Review of Systems  Constitutional: Negative for fever. Eyes: Negative for visual changes. ENT: Negative for sore throat. Cardiovascular: Negative for chest pain. Respiratory: Positive for shortness of breath. Gastrointestinal: Negative for abdominal pain, vomiting and diarrhea. Genitourinary: Negative for dysuria. Musculoskeletal: Negative for back pain. Skin: Negative for rash. Neurological: Negative for headache. 10 point Review of Systems otherwise negative ____________________________________________   PHYSICAL EXAM:  VITAL SIGNS: ED Triage Vitals  Enc Vitals Group     BP 01/20/16 1240 131/49 mmHg     Pulse Rate 01/20/16 1240 101     Resp 01/20/16 1240 26     Temp 01/20/16 1240  97.6 F (36.4 C)     Temp Source 01/20/16 1240 Oral     SpO2 01/20/16 1240 87 %     Weight 01/20/16 1240 179 lb (81.194 kg)     Height 01/20/16 1240 5\' 6"  (1.676 m)     Head Cir --      Peak Flow --      Pain Score --      Pain Loc --      Pain Edu? --      Excl. in [place]? --      Constitutional: Alert and oriented. Well appearing and in no distress. HEENT   Head: Normocephalic and atraumatic.      Eyes: Conjunctivae are normal. PERRL. Normal extraocular movements.      Ears:         Nose: No congestion/rhinnorhea.   Mouth/Throat: Mucous membranes are moist.   Neck: No stridor. Cardiovascular/Chest: Normal rate, regular rhythm.  No murmurs, rubs, or gallops. Respiratory: Mild tachypnea, no retractions. Decreased breath sounds/tight throughout all fields, and expiratory wheezing moderate. Gastrointestinal: Soft. No distention, no guarding, no rebound. Nontender.    Genitourinary/rectal:Deferred Musculoskeletal: Nontender with normal range of motion in all extremities. No joint effusions.  No lower extremity tenderness.  One are 2+ lower extremity pitting edema bilateral pretibial. Neurologic:  Normal speech and language. No gross or focal neurologic deficits are appreciated. Skin:  Skin is warm, dry and intact. No rash noted. Psychiatric: Mood and affect are normal. Speech and behavior are normal. Patient exhibits appropriate insight and judgment.  ____________________________________________   EKG I, Lisa Roca, MD, the attending physician have personally viewed and interpreted all ECGs.  95 bpm. Normal sinus rhythm. Suspect left axis deviation. Nonspecific ST and T-wave ____________________________________________  LABS (pertinent positives/negatives)  Basic metabolic panel significant for BUN 23 Troponin 0.04 White blood count 14.3 and hemoglobin 12.4 and platelet count 346 BNP 81  ____________________________________________  RADIOLOGY All Xrays were  viewed by me. Imaging interpreted by Radiologist.  Chest 1 view:  IMPRESSION: New mild asymmetric opacity in left lower lung, suspicious for mild or early pneumonia. Followup PA and lateral chest X-ray is recommended in 3-4 weeks following trial of antibiotic therapy to ensure resolution. __________________________________________  PROCEDURES  Procedure(s) performed: None  Critical Care performed: None  ____________________________________________   ED COURSE / ASSESSMENT AND PLAN  Pertinent labs & imaging results that were available during my care of the patient were reviewed by me and considered in my medical decision making (see chart for details).   Patient placed on 2 L nasal cannula, and O2 sat 95% on 2 L.  Patient is short of breath and has significant wheezing and tightness consistent with clinical impression COPD exacerbation.  Patient started on DuoNeb treatment as well as Solu-Medrol.  I'm suspicious that she will need hospitalization, as this is the worst that she's ever been, and she has clear COPD exacerbation with hypoxia.  ----------------------------------------- 3:04 PM on 01/20/2016 -----------------------------------------  Patient's clinical breath sounds seem somewhat improved, but on walking she gets hypoxic to 85%. She will need hospitalization. Her white blood count was elevated and her chest x-ray shows in early left lung infiltrate. Blood cultures were sent although I do not see signs of sepsis at this point time. Patient will be given Levaquin, as she reports an anaphylactic allergy to penicillins, I don't want to give her Rocephin.    CONSULTATIONS:   Hospitalist for admission   Patient / Family / Caregiver informed of clinical course, medical decision-making process, and agree with plan.   ___________________________________________   FINAL CLINICAL IMPRESSION(S) / ED DIAGNOSES   Final diagnoses:  COPD exacerbation ([place])  Hypoxia   Pneumonia involving left lung, unspecified part of lung              Note: This dictation was prepared with Dragon dictation. Any transcriptional errors that result from this process are unintentional   Lisa Roca, MD 01/20/16 1505

## 2016-01-20 NOTE — Progress Notes (Signed)
ANTIBIOTIC CONSULT NOTE - INITIAL  Pharmacy Consult for Levaquin  Indication: COPD exacerbation  Allergies  Allergen Reactions  . Penicillins Anaphylaxis and Other (See Comments)    Has patient had a PCN reaction causing immediate rash, facial/tongue/throat swelling, SOB or lightheadedness with hypotension: Yes Has patient had a PCN reaction causing severe rash involving mucus membranes or skin necrosis: No Has patient had a PCN reaction that required hospitalization No Has patient had a PCN reaction occurring within the last 10 years: No If all of the above answers are "NO", then may proceed with Cephalosporin use.    Patient Measurements: Height: 5\' 6"  (167.6 cm) Weight: 179 lb (81.194 kg) IBW/kg (Calculated) : 59.3 Adjusted Body Weight:   Vital Signs: Temp: 98.2 F (36.8 C) (04/21 1722) Temp Source: Oral (04/21 1722) BP: 136/70 mmHg (04/21 1722) Pulse Rate: 101 (04/21 1722) Intake/Output from previous day:   Intake/Output from this shift:    Labs:  Recent Labs  01/20/16 1301  WBC 14.3*  HGB 12.4  PLT 346  CREATININE 0.84   Estimated Creatinine Clearance: 67 mL/min (by C-G formula based on Cr of 0.84). No results for input(s): VANCOTROUGH, VANCOPEAK, VANCORANDOM, GENTTROUGH, GENTPEAK, GENTRANDOM, TOBRATROUGH, TOBRAPEAK, TOBRARND, AMIKACINPEAK, AMIKACINTROU, AMIKACIN in the last 72 hours.   Microbiology: No results found for this or any previous visit (from the past 720 hour(s)).  Medical History: Past Medical History  Diagnosis Date  . HTN (hypertension)   . COPD (chronic obstructive pulmonary disease) (HCC)     Not on home o2  . Hyperlipemia     Medications:  Prescriptions prior to admission  Medication Sig Dispense Refill Last Dose  . albuterol (PROAIR HFA) 108 (90 BASE) MCG/ACT inhaler Inhale 2 puffs into the lungs every 6 (six) hours as needed for wheezing or shortness of breath. 1 Inhaler 2 Past Month at Unknown time  . atorvastatin (LIPITOR) 20 MG  tablet Take 20 mg by mouth at bedtime.   01/19/2016 at Unknown time  . busPIRone (BUSPAR) 5 MG tablet Take 1 tablet (5 mg total) by mouth 3 (three) times daily. 90 tablet 1 01/19/2016 at Unknown time  . cholecalciferol (VITAMIN D) 1000 UNITS tablet Take 2,000 Units by mouth daily.   01/19/2016 at Unknown time  . FLUoxetine (PROZAC) 40 MG capsule Take 1 capsule (40 mg total) by mouth daily. 90 capsule 3 01/19/2016 at Unknown time  . lisinopril (PRINIVIL,ZESTRIL) 40 MG tablet Take 1 tablet (40 mg total) by mouth daily. 90 tablet 3 01/19/2016 at Unknown time  . Multiple Vitamins-Minerals (MULTIVITAMIN WITH MINERALS) tablet Take 1 tablet by mouth daily.   01/19/2016 at Unknown time   Assessment: CrCl = 67 ml/min   Goal of Therapy:  CrCl = 67 ml/min   Plan:  Expected duration 7 days with resolution of temperature and/or normalization of WBC   Levaquin 750 mg IV X 1 given on 4/21 followed by levaquin 500 mg IV Q24H to start 4/22 @ 18:00.   Khila Papp D 01/20/2016,5:24 PM

## 2016-01-20 NOTE — ED Notes (Signed)
Patient states that she was by her PCP this morning and was sent to [place] for low oxygen level. Patient states that her O2 sats were about 87-88% on room air. Patient does not wear O2 at home. Per patient daughter she noticed a change in patients breathing last PM. Patient states that she has had cough and congestion for the past few days.

## 2016-01-20 NOTE — ED Notes (Signed)
Report given to Kerry, RN

## 2016-01-20 NOTE — ED Notes (Signed)
Patient O2 sat decreased to 85% when taken off O2. Patient placed back on 2L O2 via [place]

## 2016-01-20 NOTE — ED Notes (Signed)
REDS VEST READING= 21,25 CHEST RULER= 13,13.5  VEST FITTING TASKS: POSTURE= Sitting (Bed) HEIGHT MARKER= T CENTER STRIP= Aligned  COMMENTS:approved by [employer].

## 2016-01-20 NOTE — ED Notes (Signed)
Pt was sent from [place] with c/o SOB, low O2 sats.. Pt states she has a hx of COPD and has had a cough for the past 7 days.Marland Kitchen

## 2016-01-20 NOTE — ED Notes (Signed)
Dr. Reita Cliche informed of Troponin of 0.04

## 2016-01-20 NOTE — Patient Instructions (Signed)
Nice to meet you. You are likely having a COPD exacerbation given that her oxygen saturation is low we need to go to the emergency room for further evaluation. If you develop worsening breathing issues, or develop chest pain, sweatiness, or any new or change in symptoms in route to the emergency room the need to call 911.

## 2016-01-20 NOTE — Progress Notes (Signed)
*  PRELIMINARY RESULTS* Echocardiogram 2D Echocardiogram has been performed.  Tammy Choi 01/20/2016, 7:31 PM

## 2016-01-20 NOTE — ED Notes (Signed)
Serena [phone]- daughter   [phone]- Dwain- Son in law

## 2016-01-20 NOTE — Progress Notes (Signed)
Patient ID: Tammy Choi, female   DOB: Jul 07, 1946, 71 y.o.   MRN: [id number]  [name], MD Phone: [phone]  Tammy Choi is a 70 y.o. female who presents today for same-day visit.  Patient presents with progressive onset of exertional shortness of breath over the last several months. Has worsened significantly in the last several weeks. Notes she had shortness of breath sitting there as well. No chest pain. She does get somewhat diaphoretic with exertion. She notes 2 weeks ago she had sinus symptoms and coughing. She is coughing greenish yellowish mucus up. She has a history of COPD. No cardiac history. She has had dyspnea on exertion previously.  PMH: Smoker   ROS see history of present illness  Objective  Physical Exam Filed Vitals:   01/20/16 1045  BP: 128/70  Pulse: 94  Temp: 98.4 F (36.9 C)  Ambulatory O2 sat 88%.  BP Readings from Last 3 Encounters:  01/20/16 128/70  11/03/15 118/78  10/06/15 164/82   Wt Readings from Last 3 Encounters:  01/20/16 179 lb 12.8 oz (81.557 kg)  10/06/15 183 lb 4 oz (83.122 kg)  07/21/15 181 lb 8 oz (82.328 kg)    Physical Exam  Constitutional: No distress.  HENT:  Head: Normocephalic and atraumatic.  Right Ear: External ear normal.  Left Ear: External ear normal.  Mouth/Throat: Oropharynx is clear and moist. No oropharyngeal exudate.  Normal TMs bilaterally  Eyes: Conjunctivae are normal. Pupils are equal, round, and reactive to light.  Cardiovascular: Normal rate, regular rhythm and normal heart sounds.   Pulmonary/Chest:  Visibly short of breath after walking, this improved with sitting, No wheezing, no crackles, mildly decreased air movement throughout  Neurological: She is alert. Gait normal.  Skin: Skin is warm and dry. She is not diaphoretic.   EKG: Normal sinus rhythm, rate 90, Q waves in 2, 3, and aVF, no ST or T-wave changes  Assessment/Plan: Please see individual problem list.  Dyspnea on  exertion Patient with significant dyspnea on exertion with oxygen desaturation to 88% while walking. This is in setting of recent upper respiratory infection and now cough productive of green mucus. Suspect likely COPD exacerbation, though could be cardiac related as well. EKG was performed that did not reveal any acute changes. Given her oxygen desaturation and visible shortness of breath on exertion she was advised to take EMS transport to the emergency room. She refused EMS transport and opted to transport herself. Risks of self transport reviewed. AMA paperwork was signed. She will go straight to the emergency room. She is given precautions to call 911 in route. CMA will call the charge nurse and let them know the patient is on her way.    Orders Placed This Encounter  Procedures  . EKG 12-Lead    [name], MD [place]

## 2016-01-20 NOTE — Progress Notes (Signed)
Pre visit review using our clinic review tool, if applicable. No additional management support is needed unless otherwise documented below in the visit note. 

## 2016-01-20 NOTE — Assessment & Plan Note (Addendum)
Patient with significant dyspnea on exertion with oxygen desaturation to 88% while walking. This is in setting of recent upper respiratory infection and now cough productive of green mucus. Suspect likely COPD exacerbation, though could be cardiac related as well. EKG was performed that did not reveal any acute changes. Given her oxygen desaturation and visible shortness of breath on exertion she was advised to take EMS transport to the emergency room. She refused EMS transport and opted to transport herself. Risks of self transport reviewed. AMA paperwork was signed. She will go straight to the emergency room. She is given precautions to call 911 in route. CMA will call the charge nurse and let them know the patient is on her way.

## 2016-01-21 DIAGNOSIS — J441 Chronic obstructive pulmonary disease with (acute) exacerbation: Secondary | ICD-10-CM | POA: Diagnosis not present

## 2016-01-21 DIAGNOSIS — J189 Pneumonia, unspecified organism: Secondary | ICD-10-CM | POA: Diagnosis not present

## 2016-01-21 DIAGNOSIS — I10 Essential (primary) hypertension: Secondary | ICD-10-CM | POA: Diagnosis not present

## 2016-01-21 DIAGNOSIS — R748 Abnormal levels of other serum enzymes: Secondary | ICD-10-CM | POA: Diagnosis not present

## 2016-01-21 LAB — BASIC METABOLIC PANEL
ANION GAP: 9 (ref 5–15)
BUN: 18 mg/dL (ref 6–20)
CALCIUM: 8.7 mg/dL — AB (ref 8.9–10.3)
CHLORIDE: 99 mmol/L — AB (ref 101–111)
CO2: 28 mmol/L (ref 22–32)
Creatinine, Ser: 0.61 mg/dL (ref 0.44–1.00)
GFR calc Af Amer: >60 mL/min (ref >60)
GFR calc non Af Amer: >60 mL/min (ref >60)
GLUCOSE: 165 mg/dL — AB (ref 65–99)
Potassium: 4 mmol/L (ref 3.5–5.1)
Sodium: 136 mmol/L (ref 135–145)

## 2016-01-21 LAB — CBC
HEMATOCRIT: 35.7 % (ref 35.0–47.0)
HEMOGLOBIN: 11.7 g/dL — AB (ref 12.0–16.0)
MCH: 27.7 pg (ref 26.0–34.0)
MCHC: 32.9 g/dL (ref 32.0–36.0)
MCV: 84.4 fL (ref 80.0–100.0)
PLATELETS: 327 10*3/uL (ref 150–440)
RBC: 4.23 MIL/uL (ref 3.80–5.20)
RDW: 13.8 % (ref 11.5–14.5)
WBC: 11 10*3/uL (ref 3.6–11.0)

## 2016-01-21 LAB — ECHOCARDIOGRAM COMPLETE
HEIGHTINCHES: 66 in
Weight: 2864 oz

## 2016-01-21 LAB — TROPONIN I: Troponin I: 0.04 ng/mL — ABNORMAL HIGH (ref <0.031)

## 2016-01-21 MED ORDER — METHYLPREDNISOLONE SODIUM SUCC 40 MG IJ SOLR
40.0000 mg | Freq: Two times a day (BID) | INTRAMUSCULAR | Status: DC
  Administered 2016-01-21 – 2016-01-23 (×4): 40 mg via INTRAVENOUS
  Filled 2016-01-21 (×4): qty 1

## 2016-01-21 MED ORDER — LORAZEPAM 1 MG PO TABS
1.0000 mg | ORAL_TABLET | Freq: Every evening | ORAL | Status: DC | PRN
  Administered 2016-01-21 – 2016-01-23 (×3): 1 mg via ORAL
  Filled 2016-01-21 (×3): qty 1

## 2016-01-21 NOTE — Progress Notes (Signed)
[facility] at [place] NAME: Tammy Choi    MR#:  [id number]  DATE OF BIRTH:  1946/02/25  SUBJECTIVE:  CHIEF COMPLAINT:   Chief Complaint  Patient presents with  . Shortness of Breath   -Patient admitted with hypoxia secondary to COPD exacerbation and pneumonia on chest x-ray. -Still remains on 2 L oxygen. Complaints of insomnia and inability to sleep last night. -Coughing is improved today.  not on any home oxygen.  REVIEW OF SYSTEMS:  Review of Systems  Constitutional: Positive for malaise/fatigue. Negative for fever and chills.  HENT: Negative for ear discharge, ear pain and nosebleeds.   Eyes: Negative for blurred vision and double vision.  Respiratory: Positive for cough, shortness of breath and wheezing.   Cardiovascular: Negative for chest pain, palpitations and leg swelling.  Gastrointestinal: Negative for nausea, vomiting, abdominal pain, diarrhea and constipation.  Genitourinary: Negative for dysuria and urgency.  Musculoskeletal: Negative for myalgias.  Neurological: Negative for dizziness, sensory change, speech change, focal weakness, seizures and headaches.  Psychiatric/Behavioral: The patient is nervous/anxious and has insomnia.     DRUG ALLERGIES:   Allergies  Allergen Reactions  . Penicillins Anaphylaxis and Other (See Comments)    Has patient had a PCN reaction causing immediate rash, facial/tongue/throat swelling, SOB or lightheadedness with hypotension: Yes Has patient had a PCN reaction causing severe rash involving mucus membranes or skin necrosis: No Has patient had a PCN reaction that required hospitalization No Has patient had a PCN reaction occurring within the last 10 years: No If all of the above answers are "NO", then may proceed with Cephalosporin use.    VITALS:  Blood pressure 114/56, pulse 102, temperature 98.6 F (37 C), temperature source Oral, resp. rate 20, height 5\' 6"  (1.676 m),  weight 81.194 kg (179 lb), SpO2 93 %.  PHYSICAL EXAMINATION:  Physical Exam  GENERAL: 70 y.o.-year-old patient lying in the bed with no acute distress.  EYES: Pupils equal, round, reactive to light and accommodation. No scleral icterus. Extraocular muscles intact.  HEENT: Head atraumatic, normocephalic. Oropharynx and nasopharynx clear.  NECK: Supple, no jugular venous distention. No thyroid enlargement, no tenderness.  LUNGS: Decreased bibasilar breath sounds. Scattered wheezing and basilar rhonchi heard. No rales or crepitations. No use of accessory muscles of respiration.  CARDIOVASCULAR: S1, S2 normal. No murmurs, rubs, or gallops.  ABDOMEN: Soft, nontender, nondistended. Bowel sounds present. No organomegaly or mass.  EXTREMITIES: No pedal edema, cyanosis, or clubbing.  NEUROLOGIC: Cranial nerves II through XII are intact. Muscle strength 5/5 in all extremities. Sensation intact. Gait not checked.  PSYCHIATRIC: The patient is alert and oriented x 3.  SKIN: No obvious rash, lesion, or ulcer.     LABORATORY PANEL:   CBC  Recent Labs Lab 01/21/16 0344  WBC 11.0  HGB 11.7*  HCT 35.7  PLT 327   ------------------------------------------------------------------------------------------------------------------  Chemistries   Recent Labs Lab 01/21/16 0344  NA 136  K 4.0  CL 99*  CO2 28  GLUCOSE 165*  BUN 18  CREATININE 0.61  CALCIUM 8.7*   ------------------------------------------------------------------------------------------------------------------  Cardiac Enzymes  Recent Labs Lab 01/21/16 0344  TROPONINI 0.04*   ------------------------------------------------------------------------------------------------------------------  RADIOLOGY:  Dg Chest Port 1 View  01/20/2016  CLINICAL DATA:  Cough and chest congestion for several days. Worsening hypoxia. EXAM: PORTABLE CHEST 1 VIEW COMPARISON:  07/24/2012 FINDINGS: Heart size remains normal.  Pulmonary hyperinflation again seen. Increased mild asymmetric opacity is seen in the left lower lung along the  left heart border, and mild or early pneumonia cannot be excluded. No evidence of pleural effusion. IMPRESSION: New mild asymmetric opacity in left lower lung, suspicious for mild or early pneumonia. Followup PA and lateral chest X-ray is recommended in 3-4 weeks following trial of antibiotic therapy to ensure resolution. Electronically Signed   By: Earle Gell M.D.   On: 01/20/2016 13:53    EKG:   Orders placed or performed during the hospital encounter of 01/20/16  . ED EKG  . ED EKG  . EKG 12-Lead  . EKG 12-Lead    ASSESSMENT AND PLAN:   [name] is a 70 y.o. female with a known history of Hypertension, COPD not on home oxygen, hyperlipidemia sent in from PCP office secondary to hypoxia and worsening cough and respiratory symptoms.  #1 COPD exacerbation- o2 support - solumedrol - decrease dose today and duonebs - wean o2 as tolerated  #2 community-acquired pneumonia- f/u Blood cultures - on levaquin - monitor o2 sats. O2 support, cough meds  #3 hypertension- continue lisinopril  #4 hyperlipidemia-continue statin  #5 elevated troponin-demand ischemia. Plateaued troponins. No active cardiac symptoms. No EKG changes. F/u ECHO No restrictions to work with physical therapy  #6 DVT prophylaxis- on Lovenox  #7 depression and anxiety-continue home medications. Patient is on Prozac and BuSpar Also added Ativan at bedtime as needed due to insomnia secondary to steroids.     All the records are reviewed and case discussed with Care Management/Social Workerr. Management plans discussed with the patient, family and they are in agreement.  CODE STATUS: Full Code  TOTAL TIME TAKING CARE OF THIS PATIENT: 38 minutes.   POSSIBLE D/C IN 1-2 DAYS, DEPENDING ON CLINICAL CONDITION.   Gladstone Lighter M.D on 01/21/2016 at 11:02 AM  Between 7am to 6pm - Pager -  [phone]  After 6pm go to [email or web address] - password [email or web address] [facility]  Office  [phone]  CC: Primary care physician; Rica Mast, MD

## 2016-01-21 NOTE — Progress Notes (Signed)
PT Cancellation Note  Patient Details Name: Tammy Choi MRN: [id number] DOB: [birth year]   Cancelled Treatment:    Reason Eval/Treat Not Completed: Medical issues which prohibited therapy (awaiting cardiology opinion for troponin).  Will check later today.   Ramond Dial 01/21/2016, 7:36 AM   Mee Hives, PT MS Acute Rehab Dept. Number: [id number] and [place] [phone]

## 2016-01-22 ENCOUNTER — Inpatient Hospital Stay: Payer: PPO

## 2016-01-22 DIAGNOSIS — J441 Chronic obstructive pulmonary disease with (acute) exacerbation: Secondary | ICD-10-CM | POA: Diagnosis not present

## 2016-01-22 DIAGNOSIS — R748 Abnormal levels of other serum enzymes: Secondary | ICD-10-CM | POA: Diagnosis not present

## 2016-01-22 DIAGNOSIS — I10 Essential (primary) hypertension: Secondary | ICD-10-CM | POA: Diagnosis not present

## 2016-01-22 DIAGNOSIS — J189 Pneumonia, unspecified organism: Secondary | ICD-10-CM | POA: Diagnosis not present

## 2016-01-22 DIAGNOSIS — J449 Chronic obstructive pulmonary disease, unspecified: Secondary | ICD-10-CM | POA: Diagnosis not present

## 2016-01-22 MED ORDER — LEVOFLOXACIN 500 MG PO TABS
500.0000 mg | ORAL_TABLET | Freq: Every day | ORAL | Status: DC
  Administered 2016-01-22 – 2016-01-24 (×3): 500 mg via ORAL
  Filled 2016-01-22 (×3): qty 1

## 2016-01-22 NOTE — Progress Notes (Signed)
[facility] at [place] NAME: Jessia Lusco    MR#:  [id number]  DATE OF BIRTH:  [birth year]  SUBJECTIVE:  CHIEF COMPLAINT:   Chief Complaint  Patient presents with  . Shortness of Breath   -coughing spell this morning, still needing o2- on 1L today - slept better, anxious  REVIEW OF SYSTEMS:  Review of Systems  Constitutional: Negative for fever, chills and malaise/fatigue.  HENT: Negative for ear discharge, ear pain and nosebleeds.   Eyes: Negative for blurred vision and double vision.  Respiratory: Positive for cough, shortness of breath and wheezing.   Cardiovascular: Negative for chest pain, palpitations and leg swelling.  Gastrointestinal: Negative for nausea, vomiting, abdominal pain, diarrhea and constipation.  Genitourinary: Negative for dysuria and urgency.  Musculoskeletal: Negative for myalgias.  Neurological: Negative for dizziness, sensory change, speech change, focal weakness, seizures and headaches.  Psychiatric/Behavioral: The patient is nervous/anxious. The patient does not have insomnia.     DRUG ALLERGIES:   Allergies  Allergen Reactions  . Penicillins Anaphylaxis and Other (See Comments)    Has patient had a PCN reaction causing immediate rash, facial/tongue/throat swelling, SOB or lightheadedness with hypotension: Yes Has patient had a PCN reaction causing severe rash involving mucus membranes or skin necrosis: No Has patient had a PCN reaction that required hospitalization No Has patient had a PCN reaction occurring within the last 10 years: No If all of the above answers are "NO", then may proceed with Cephalosporin use.    VITALS:  Blood pressure 148/74, pulse 100, temperature 98.4 F (36.9 C), temperature source Oral, resp. rate 16, height 5\' 6"  (1.676 m), weight 81.194 kg (179 lb), SpO2 91 %.  PHYSICAL EXAMINATION:  Physical Exam  GENERAL: 70 y.o.-year-old patient sitting in the bed with no  acute distress. Coughing spell after neb trt EYES: Pupils equal, round, reactive to light and accommodation. No scleral icterus. Extraocular muscles intact.  HEENT: Head atraumatic, normocephalic. Oropharynx and nasopharynx clear.  NECK: Supple, no jugular venous distention. No thyroid enlargement, no tenderness.  LUNGS: Decreased bibasilar breath sounds. Scattered wheezing and basilar rhonchi heard. No rales or crepitations. No use of accessory muscles of respiration.  CARDIOVASCULAR: S1, S2 normal. No murmurs, rubs, or gallops.  ABDOMEN: Soft, nontender, nondistended. Bowel sounds present. No organomegaly or mass.  EXTREMITIES: No pedal edema, cyanosis, or clubbing.  NEUROLOGIC: Cranial nerves II through XII are intact. Muscle strength 5/5 in all extremities. Sensation intact. Gait not checked.  PSYCHIATRIC: The patient is alert and oriented x 3.  SKIN: No obvious rash, lesion, or ulcer.     LABORATORY PANEL:   CBC  Recent Labs Lab 01/21/16 0344  WBC 11.0  HGB 11.7*  HCT 35.7  PLT 327   ------------------------------------------------------------------------------------------------------------------  Chemistries   Recent Labs Lab 01/21/16 0344  NA 136  K 4.0  CL 99*  CO2 28  GLUCOSE 165*  BUN 18  CREATININE 0.61  CALCIUM 8.7*   ------------------------------------------------------------------------------------------------------------------  Cardiac Enzymes  Recent Labs Lab 01/21/16 0344  TROPONINI 0.04*   ------------------------------------------------------------------------------------------------------------------  RADIOLOGY:  Dg Chest Port 1 View  01/20/2016  CLINICAL DATA:  Cough and chest congestion for several days. Worsening hypoxia. EXAM: PORTABLE CHEST 1 VIEW COMPARISON:  07/24/2012 FINDINGS: Heart size remains normal. Pulmonary hyperinflation again seen. Increased mild asymmetric opacity is seen in the left lower lung along the left  heart border, and mild or early pneumonia cannot be excluded. No evidence of pleural effusion. IMPRESSION: New mild  asymmetric opacity in left lower lung, suspicious for mild or early pneumonia. Followup PA and lateral chest X-ray is recommended in 3-4 weeks following trial of antibiotic therapy to ensure resolution. Electronically Signed   By: Earle Gell M.D.   On: 01/20/2016 13:53    EKG:   Orders placed or performed during the hospital encounter of 01/20/16  . ED EKG  . ED EKG  . EKG 12-Lead  . EKG 12-Lead    ASSESSMENT AND PLAN:   Shalece Herrera is a 70 y.o. female with a known history of Hypertension, COPD not on home oxygen, hyperlipidemia sent in from PCP office secondary to hypoxia and worsening cough and respiratory symptoms.  #1 COPD exacerbation- cont o2 support- on 1L now, not on home o2. - solumedrol -on bid doses now and duonebs - wean o2 as tolerated - encourage ambulation  #2 community-acquired pneumonia- negative Blood cultures - on levaquin - monitor o2 sats. O2 support, cough meds - f/u CXR today - Incentive spirometer given  #3 hypertension- continue lisinopril  #4 hyperlipidemia-continue statin  #5 elevated troponin-demand ischemia. Plateaued troponins. No active cardiac symptoms. No EKG changes. ECHO with normal EF, diastolic dysfunction noted- mild. No restrictions to work with physical therapy  #6 DVT prophylaxis- on Lovenox  #7 depression and anxiety-continue home medications. Patient is on Prozac and BuSpar Also added Ativan at bedtime as needed due to insomnia secondary to steroids.  Physical therapy consulted. Anticipated discharge tomorrow   All the records are reviewed and case discussed with Care Management/Social Workerr. Management plans discussed with the patient, family and they are in agreement.  CODE STATUS: Full Code  TOTAL TIME TAKING CARE OF THIS PATIENT: 36 minutes.   POSSIBLE D/C IN 1-2 DAYS, DEPENDING ON CLINICAL  CONDITION.   Gladstone Lighter M.D on 01/22/2016 at 8:48 AM  Between 7am to 6pm - Pager - [phone]  After 6pm go to [email or web address] - password [email or web address] [facility] [facility]  Office  [phone]  CC: Primary care physician; Rica Mast, MD

## 2016-01-22 NOTE — Evaluation (Signed)
Physical Therapy Evaluation Patient Details Name: Tammy Choi MRN: [id number] DOB: 1946/08/21 Today's Date: 01/22/2016   History of Present Illness  70 yo female with onset of COPD with SOB, hypoxia and L basilar infiltrate was seen 2 weeks ago for upper respiratory infection.  Elevated troponin from demand ischemia.   Clinical Impression  Pt was seen for evaluation of her mobility and to recommend AD and services.  Pt is really motivated, has a better O2 line for mobility and is aware to ask for help for longer hallway walks rather than to get up just in the room.  Will maintain her on PT list for mobility and strengthening to LLE.    Follow Up Recommendations Home health PT    Equipment Recommendations  Cane    Recommendations for Other Services Rehab consult     Precautions / Restrictions Precautions Precautions: Fall Restrictions Weight Bearing Restrictions: No      Mobility  Bed Mobility Overal bed mobility: Modified Independent                Transfers Overall transfer level: Needs assistance Equipment used: 1 person hand held assist;Straight cane Transfers: Sit to/from Stand;Stand Pivot Transfers Sit to Stand: Supervision Stand pivot transfers: Supervision       General transfer comment: supervised for safety and uses good technique  Ambulation/Gait Ambulation/Gait assistance: Min guard Ambulation Distance (Feet): 50 Feet (15+20+15) Assistive device: Straight cane;1 person hand held assist (tried both) Gait Pattern/deviations: Narrow base of support;Trunk flexed;Step-through pattern Gait velocity: reduced Gait velocity interpretation: Below normal speed for age/gender General Gait Details: SPC affords security with gait to prevent pt having to hold the furniture  Stairs            Wheelchair Mobility    Modified Rankin (Stroke Patients Only)       Balance Overall balance assessment: Needs assistance Sitting-balance support: Feet  supported Sitting balance-Leahy Scale: Good   Postural control: Posterior lean Standing balance support: Single extremity supported Standing balance-Leahy Scale: Fair                               Pertinent Vitals/Pain Pain Assessment: No/denies pain    Home Living Family/patient expects to be discharged to:: Private residence Living Arrangements: Children Available Help at Discharge: Family;Available PRN/intermittently Type of Home: House Home Access: Stairs to enter Entrance Stairs-Rails: Right;Left;Can reach both Entrance Stairs-Number of Steps: 3 Home Layout: One level Home Equipment: None      Prior Function Level of Independence: Independent               Hand Dominance        Extremity/Trunk Assessment   Upper Extremity Assessment: Overall WFL for tasks assessed           Lower Extremity Assessment: Generalized weakness;LLE deficits/detail   LLE Deficits / Details: 4 to 4+ LLE strength, R WFL  Cervical / Trunk Assessment: Normal  Communication   Communication: No difficulties  Cognition Arousal/Alertness: Awake/alert Behavior During Therapy: WFL for tasks assessed/performed Overall Cognitive Status: Within Functional Limits for tasks assessed                      General Comments General comments (skin integrity, edema, etc.): Pt is getting up to walk with increasing control on [facility] but still asked her to use nursing assistance to go out to the hall.  Lengthened her O2 line to increase her  ease to get to BR without removing line    Exercises        Assessment/Plan    PT Assessment Patient needs continued PT services  PT Diagnosis Difficulty walking   PT Problem List Decreased strength;Decreased range of motion;Decreased activity tolerance;Decreased balance;Decreased mobility;Decreased coordination;Decreased knowledge of use of DME;Decreased safety awareness;Cardiopulmonary status limiting activity  PT Treatment  Interventions DME instruction;Gait training;Stair training;Therapeutic activities;Functional mobility training;Therapeutic exercise;Neuromuscular re-education;Balance training;Patient/family education   PT Goals (Current goals can be found in the Care Plan section) Acute Rehab PT Goals Patient Stated Goal: to walk with more independence PT Goal Formulation: With patient Time For Goal Achievement: 02/05/16 Potential to Achieve Goals: Good    Frequency Min 2X/week   Barriers to discharge Inaccessible home environment;Decreased caregiver support needs help with her care at home    Co-evaluation               End of Session Equipment Utilized During Treatment: Gait belt;Oxygen Activity Tolerance: Patient tolerated treatment well;Patient limited by fatigue Patient left: in bed;with call bell/phone within reach;with bed alarm set Nurse Communication: Mobility status         Time: 0822-0852 PT Time Calculation (min) (ACUTE ONLY): 30 min   Charges:   PT Evaluation $PT Eval Low Complexity: 1 Procedure PT Treatments $Gait Training: 8-22 mins   PT G Codes:        Ramond Dial 01/31/16, 10:31 AM    Mee Hives, PT MS Acute Rehab Dept. Number: [phone] [id number] and [place] [phone]

## 2016-01-23 ENCOUNTER — Telehealth: Payer: Self-pay | Admitting: *Deleted

## 2016-01-23 DIAGNOSIS — R748 Abnormal levels of other serum enzymes: Secondary | ICD-10-CM | POA: Diagnosis not present

## 2016-01-23 DIAGNOSIS — J189 Pneumonia, unspecified organism: Secondary | ICD-10-CM | POA: Diagnosis not present

## 2016-01-23 DIAGNOSIS — I10 Essential (primary) hypertension: Secondary | ICD-10-CM | POA: Diagnosis not present

## 2016-01-23 DIAGNOSIS — J441 Chronic obstructive pulmonary disease with (acute) exacerbation: Secondary | ICD-10-CM | POA: Diagnosis not present

## 2016-01-23 MED ORDER — PREDNISONE 10 MG PO TABS
60.0000 mg | ORAL_TABLET | Freq: Every day | ORAL | Status: DC
  Administered 2016-01-24: 60 mg via ORAL
  Filled 2016-01-23: qty 1

## 2016-01-23 MED ORDER — LEVOFLOXACIN 500 MG PO TABS: 500.0000 mg | ORAL_TABLET | Freq: Every day | ORAL | Status: DC

## 2016-01-23 MED ORDER — BISACODYL 5 MG PO TBEC
10.0000 mg | DELAYED_RELEASE_TABLET | Freq: Every day | ORAL | Status: DC
  Administered 2016-01-23: 11:00:00 10 mg via ORAL
  Filled 2016-01-23 (×2): qty 2

## 2016-01-23 MED ORDER — GUAIFENESIN-CODEINE 100-10 MG/5ML PO SOLN: 10.0000 mL | Freq: Four times a day (QID) | ORAL | Status: DC | PRN

## 2016-01-23 MED ORDER — SENNOSIDES-DOCUSATE SODIUM 8.6-50 MG PO TABS
2.0000 | ORAL_TABLET | Freq: Every day | ORAL | Status: DC
  Administered 2016-01-23: 11:00:00 2 via ORAL
  Filled 2016-01-23 (×2): qty 2

## 2016-01-23 MED ORDER — IPRATROPIUM-ALBUTEROL 0.5-2.5 (3) MG/3ML IN SOLN
3.0000 mL | Freq: Four times a day (QID) | RESPIRATORY_TRACT | Status: DC
  Administered 2016-01-23 – 2016-01-24 (×6): 3 mL via RESPIRATORY_TRACT
  Filled 2016-01-23 (×6): qty 3

## 2016-01-23 MED ORDER — IPRATROPIUM-ALBUTEROL 0.5-2.5 (3) MG/3ML IN SOLN: 3.0000 mL | RESPIRATORY_TRACT | Status: DC | PRN

## 2016-01-23 MED ORDER — BUDESONIDE-FORMOTEROL FUMARATE 160-4.5 MCG/ACT IN AERO: 2.0000 | INHALATION_SPRAY | Freq: Two times a day (BID) | RESPIRATORY_TRACT | Status: DC

## 2016-01-23 NOTE — Progress Notes (Signed)
PRN mirilax given per patient request for constipation. No results as of yet. Continue to monitor.

## 2016-01-23 NOTE — Telephone Encounter (Signed)
error 

## 2016-01-23 NOTE — Care Management Important Message (Signed)
Important Message  Patient Details  Name: Tammy Choi MRN: [id number] Date of Birth: 1945/11/19   Medicare Important Message Given:  Yes    Juliann Pulse A Alayziah Tangeman 01/23/2016, 10:40 AM

## 2016-01-23 NOTE — Progress Notes (Signed)
Physical Therapy Treatment Patient Details Name: ODELIA SIGUR MRN: [id number] DOB: [birth year] Today's Date: 01/23/2016    History of Present Illness 70 yo female with onset of COPD with SOB, hypoxia and L basilar infiltrate was seen 2 weeks ago for upper respiratory infection.  Elevated troponin from demand ischemia.     PT Comments    Pt demonstrates considerable improvement in ambulation distance today compared with prior sessions. She is able to complete 2 full laps around RN station with minimal DOE. Upon arrival to room O2 doffed and SaO2 at 93% on room air at rest. During ambulation SaO2 drops to 88% on room air and will note recover until supplemental O2 reapplied at 2L/min. Once O2 donned pt able to ambulate with SaO2 at 90% on 2 L/min O2. Pt able to complete all seated exercises as instructed. She presents with some higher level balance deficits and pt provided with a few home exercises she can perform for her balance. She would benefit from referral to pulmonary rehab in order to improve baseline lung function as well as integrate healthy lifestyle and exercise modifications. Pt will benefit from skilled PT services to address deficits in strength, balance, and mobility in order to return to full function at home.    Follow Up Recommendations  Other (comment) (Pulmonary rehab, brochure provided)     Equipment Recommendations  None recommended by PT    Recommendations for Other Services Rehab consult     Precautions / Restrictions Precautions Precautions: Fall Restrictions Weight Bearing Restrictions: No    Mobility  Bed Mobility               General bed mobility comments: Received upright in recliner  Transfers Overall transfer level: Modified independent   Transfers: Sit to/from Stand Sit to Stand: Modified independent (Device/Increase time)         General transfer comment: Pt demonstrates good speed and sequencing with sit to stand transfers. Good  stability in standing without UE support  Ambulation/Gait Ambulation/Gait assistance: Min guard Ambulation Distance (Feet): 350 Feet Assistive device: None Gait Pattern/deviations: Decreased step length - right;Decreased step length - left Gait velocity: Decreased but functional for limited community distances   General Gait Details: Pt demonstrates improved stability with gait compared to previous PT sessions. She is able to ambulate 2 laps around RN station with minimal DOE reported. SaO2 monitored and drops to 88% on room air during ambulation. Does not rebound until supplemental O2 applied at 2L/min. Modified DGI: 12/12 during ambulation. Pt does not require standing rest breaks. Fatigue and vitals monitored during ambulation   Stairs            Wheelchair Mobility    Modified Rankin (Stroke Patients Only)       Balance Overall balance assessment: Needs assistance Sitting-balance support: No upper extremity supported Sitting balance-Leahy Scale: Good     Standing balance support: No upper extremity supported Standing balance-Leahy Scale: Fair Standing balance comment: Pt able to maintain wide and narrow stance without UE support. Positive Rhomberg for increased sway but no LOB. Single leg balance 4-5 seconds on each leg                    Cognition Arousal/Alertness: Awake/alert Behavior During Therapy: WFL for tasks assessed/performed Overall Cognitive Status: Within Functional Limits for tasks assessed                      Exercises General Exercises - Lower Extremity  Long Arc Quad: Strengthening;Both;10 reps;Seated Heel Slides: Strengthening;Both;10 reps;Seated Hip ABduction/ADduction: Strengthening;Both;10 reps;Seated Hip Flexion/Marching: Strengthening;Both;10 reps;Seated Heel Raises: Strengthening;Both;10 reps;Seated    General Comments        Pertinent Vitals/Pain Pain Assessment: No/denies pain    Home Living                       Prior Function            PT Goals (current goals can now be found in the care plan section) Acute Rehab PT Goals Patient Stated Goal: to walk with more independence PT Goal Formulation: With patient Time For Goal Achievement: 02/05/16 Potential to Achieve Goals: Good Progress towards PT goals: Progressing toward goals    Frequency  Min 2X/week    PT Plan Discharge plan needs to be updated    Co-evaluation             End of Session Equipment Utilized During Treatment: Gait belt;Oxygen Activity Tolerance: Patient tolerated treatment well Patient left: in chair;with call bell/phone within reach;Other (comment) (No alarm upon arrival. Per pt, RN allowing ambulation)     Time: [id number] PT Time Calculation (min) (ACUTE ONLY): 26 min  Charges:  $Gait Training: 8-22 mins $Therapeutic Exercise: 8-22 mins                    G Codes:      Lyndel Safe Huprich PT, DPT   Huprich,Jason 01/23/2016, 3:00 PM

## 2016-01-23 NOTE — Progress Notes (Signed)
ANTIBIOTIC CONSULT NOTE - FOLLOW UP   Pharmacy Consult for Levaquin  Indication: COPD exacerbation  Allergies  Allergen Reactions  . Penicillins Anaphylaxis and Other (See Comments)    Has patient had a PCN reaction causing immediate rash, facial/tongue/throat swelling, SOB or lightheadedness with hypotension: Yes Has patient had a PCN reaction causing severe rash involving mucus membranes or skin necrosis: No Has patient had a PCN reaction that required hospitalization No Has patient had a PCN reaction occurring within the last 10 years: No If all of the above answers are "NO", then may proceed with Cephalosporin use.    Patient Measurements: Height: 5\' 6"  (167.6 cm) Weight: 179 lb (81.194 kg) IBW/kg (Calculated) : 59.3 Adjusted Body Weight:   Vital Signs: Temp: 97.5 F (36.4 C) (04/24 0531) Temp Source: Oral (04/24 0531) BP: 130/70 mmHg (04/24 0531) Pulse Rate: 80 (04/24 0531) Intake/Output from previous day: 04/23 0701 - 04/24 0700 In: 240 [P.O.:240] Out: -  Intake/Output from this shift:    Labs:  Recent Labs  01/20/16 1301 01/21/16 0344  WBC 14.3* 11.0  HGB 12.4 11.7*  PLT 346 327  CREATININE 0.84 0.61   Estimated Creatinine Clearance: 70.3 mL/min (by C-G formula based on Cr of 0.61). No results for input(s): VANCOTROUGH, VANCOPEAK, VANCORANDOM, GENTTROUGH, GENTPEAK, GENTRANDOM, TOBRATROUGH, TOBRAPEAK, TOBRARND, AMIKACINPEAK, AMIKACINTROU, AMIKACIN in the last 72 hours.   Microbiology: Recent Results (from the past 720 hour(s))  Culture, blood (routine x 2)     Status: None (Preliminary result)   Collection Time: 01/20/16  3:05 PM  Result Value Ref Range Status   Specimen Description BLOOD LEFT ANTECUBITAL  Final   Special Requests BOTTLES DRAWN AEROBIC AND ANAEROBIC  3CC  Final   Culture NO GROWTH 2 DAYS  Final   Report Status PENDING  Incomplete  Culture, blood (routine x 2)     Status: None (Preliminary result)   Collection Time: 01/20/16  3:05 PM   Result Value Ref Range Status   Specimen Description BLOOD RIGHT ANTECUBITAL  Final   Special Requests BOTTLES DRAWN AEROBIC AND ANAEROBIC  5CC  Final   Culture NO GROWTH 2 DAYS  Final   Report Status PENDING  Incomplete    Medical History: Past Medical History  Diagnosis Date  . HTN (hypertension)   . COPD (chronic obstructive pulmonary disease) (HCC)     Not on home o2  . Hyperlipemia     Medications:  Prescriptions prior to admission  Medication Sig Dispense Refill Last Dose  . albuterol (PROAIR HFA) 108 (90 BASE) MCG/ACT inhaler Inhale 2 puffs into the lungs every 6 (six) hours as needed for wheezing or shortness of breath. 1 Inhaler 2 Past Month at Unknown time  . atorvastatin (LIPITOR) 20 MG tablet Take 20 mg by mouth at bedtime.   01/19/2016 at Unknown time  . busPIRone (BUSPAR) 5 MG tablet Take 1 tablet (5 mg total) by mouth 3 (three) times daily. 90 tablet 1 01/19/2016 at Unknown time  . cholecalciferol (VITAMIN D) 1000 UNITS tablet Take 2,000 Units by mouth daily.   01/19/2016 at Unknown time  . FLUoxetine (PROZAC) 40 MG capsule Take 1 capsule (40 mg total) by mouth daily. 90 capsule 3 01/19/2016 at Unknown time  . lisinopril (PRINIVIL,ZESTRIL) 40 MG tablet Take 1 tablet (40 mg total) by mouth daily. 90 tablet 3 01/19/2016 at Unknown time  . Multiple Vitamins-Minerals (MULTIVITAMIN WITH MINERALS) tablet Take 1 tablet by mouth daily.   01/19/2016 at Unknown time   Assessment: CrCl =  71 ml/min   Goal of Therapy:    Plan:  Expected duration 7 days with resolution of temperature and/or normalization of WBC  Continue Levofloxacin 500 mg PO q24 hours.   Tammy Choi D 01/23/2016,8:25 AM

## 2016-01-23 NOTE — Telephone Encounter (Signed)
Left patient a voicemail stating it is time to schedule annual low dose Lung cancer screening CT scan. Requested that patient  call back to verify information so that follow up scan may be scheduled.

## 2016-01-23 NOTE — Progress Notes (Signed)
[place] at [place] NAME: Tammy Choi    MR#:  [id number]  DATE OF BIRTH:  1946/08/20  SUBJECTIVE:  CHIEF COMPLAINT:   Chief Complaint  Patient presents with  . Shortness of Breath   - Patient not on home oxygen, now needing upto 1L, hypoxic with exertion on room air. Still feels dyspneic and not well  REVIEW OF SYSTEMS:  Review of Systems  Constitutional: Negative for fever, chills and malaise/fatigue.  HENT: Negative for ear discharge, ear pain and nosebleeds.   Eyes: Negative for blurred vision and double vision.  Respiratory: Positive for cough, shortness of breath and wheezing.   Cardiovascular: Negative for chest pain, palpitations and leg swelling.  Gastrointestinal: Negative for nausea, vomiting, abdominal pain, diarrhea and constipation.  Genitourinary: Negative for dysuria and urgency.  Musculoskeletal: Negative for myalgias.  Neurological: Negative for dizziness, sensory change, speech change, focal weakness, seizures and headaches.  Psychiatric/Behavioral: The patient is nervous/anxious. The patient does not have insomnia.     DRUG ALLERGIES:   Allergies  Allergen Reactions  . Penicillins Anaphylaxis and Other (See Comments)    Has patient had a PCN reaction causing immediate rash, facial/tongue/throat swelling, SOB or lightheadedness with hypotension: Yes Has patient had a PCN reaction causing severe rash involving mucus membranes or skin necrosis: No Has patient had a PCN reaction that required hospitalization No Has patient had a PCN reaction occurring within the last 10 years: No If all of the above answers are "NO", then may proceed with Cephalosporin use.    VITALS:  Blood pressure 124/68, pulse 80, temperature 97.5 F (36.4 C), temperature source Oral, resp. rate 22, height 5\' 6"  (1.676 m), weight 81.194 kg (179 lb), SpO2 95 %.  PHYSICAL EXAMINATION:  Physical Exam  GENERAL: 70 y.o.-year-old  patient sitting in the bed with no acute distress. Coughing spell after neb trt EYES: Pupils equal, round, reactive to light and accommodation. No scleral icterus. Extraocular muscles intact.  HEENT: Head atraumatic, normocephalic. Oropharynx and nasopharynx clear.  NECK: Supple, no jugular venous distention. No thyroid enlargement, no tenderness.  LUNGS: Decreased bibasilar breath sounds. Much improved wheezing, No rhonchi heard. No rales or crepitations. No use of accessory muscles of respiration.  CARDIOVASCULAR: S1, S2 normal. No murmurs, rubs, or gallops.  ABDOMEN: Soft, nontender, nondistended. Bowel sounds present. No organomegaly or mass.  EXTREMITIES: No pedal edema, cyanosis, or clubbing.  NEUROLOGIC: Cranial nerves II through XII are intact. Muscle strength 5/5 in all extremities. Sensation intact. Gait not checked.  PSYCHIATRIC: The patient is alert and oriented x 3.  SKIN: No obvious rash, lesion, or ulcer.     LABORATORY PANEL:   CBC  Recent Labs Lab 01/21/16 0344  WBC 11.0  HGB 11.7*  HCT 35.7  PLT 327   ------------------------------------------------------------------------------------------------------------------  Chemistries   Recent Labs Lab 01/21/16 0344  NA 136  K 4.0  CL 99*  CO2 28  GLUCOSE 165*  BUN 18  CREATININE 0.61  CALCIUM 8.7*   ------------------------------------------------------------------------------------------------------------------  Cardiac Enzymes  Recent Labs Lab 01/21/16 0344  TROPONINI 0.04*   ------------------------------------------------------------------------------------------------------------------  RADIOLOGY:  Dg Chest 2 View  01/22/2016  CLINICAL DATA:  Pneumonia.  COPD. EXAM: CHEST  2 VIEW COMPARISON:  01/20/2016 FINDINGS: The heart size and mediastinal contours are within normal limits. Increased patchy airspace opacity is seen in the lingula along the left heart border since prior study,  suspicious for pneumonia. Pulmonary hyperinflation again seen, consistent with COPD. No evidence  of pleural effusion. IMPRESSION: Increased lingular opacity, suspicious for pneumonia. Continued post treatment radiographic followup recommended to confirm resolution. COPD. Electronically Signed   By: Earle Gell M.D.   On: 01/22/2016 11:11    EKG:   Orders placed or performed during the hospital encounter of 01/20/16  . ED EKG  . ED EKG  . EKG 12-Lead  . EKG 12-Lead    ASSESSMENT AND PLAN:   [name] is a 70 y.o. female with a known history of Hypertension, COPD not on home oxygen, hyperlipidemia sent in from PCP office secondary to hypoxia and worsening cough and respiratory symptoms.  #1 COPD exacerbation- cont o2 support- on 1L now, not on home o2. -Room air sats at rest or 88% and on exertion down to 82%. Still feels slightly dyspneic. -Continue oxygen support. Might need home oxygen. Taper steroids. -Continue duonebs - encourage ambulation  #2 community-acquired pneumonia- negative Blood cultures - on levaquin - monitor o2 sats. O2 support, cough meds - Incentive spirometer given  #3 hypertension- continue lisinopril  #4 hyperlipidemia-continue statin  #5 elevated troponin-demand ischemia. Plateaued troponins. No active cardiac symptoms. No EKG changes. ECHO with normal EF, diastolic dysfunction noted- mild. No restrictions to work with physical therapy  #6 DVT prophylaxis- on Lovenox  #7 depression and anxiety-continue home medications. Patient is on Prozac and BuSpar Also added Ativan at bedtime as needed due to insomnia secondary to steroids.  Physical therapy consulted. Possible discharge tomorrow   All the records are reviewed and case discussed with Care Management/Social Workerr. Management plans discussed with the patient, family and they are in agreement.  CODE STATUS: Full Code  TOTAL TIME TAKING CARE OF THIS PATIENT: 36 minutes.   POSSIBLE D/C  TOMORROW, DEPENDING ON CLINICAL CONDITION.   Gladstone Lighter M.D on 01/23/2016 at 12:53 PM  Between 7am to 6pm - Pager - [phone]  After 6pm go to [email or web address] - password [email or web address] [facility]  Office  [phone]  CC: Primary care physician; Rica Mast, MD

## 2016-01-23 NOTE — Progress Notes (Signed)
SATURATION QUALIFICATIONS: (This note is used to comply with regulatory documentation for home oxygen)  Patient Saturations on Room Air at Rest =88%  Patient Saturations on Room Air while Ambulating = 82%  Patient Saturations on 2 Liters of oxygen while Ambulating = 95%   

## 2016-01-24 ENCOUNTER — Other Ambulatory Visit: Payer: Self-pay | Admitting: *Deleted

## 2016-01-24 DIAGNOSIS — I10 Essential (primary) hypertension: Secondary | ICD-10-CM | POA: Diagnosis not present

## 2016-01-24 DIAGNOSIS — R748 Abnormal levels of other serum enzymes: Secondary | ICD-10-CM | POA: Diagnosis not present

## 2016-01-24 DIAGNOSIS — J189 Pneumonia, unspecified organism: Secondary | ICD-10-CM | POA: Diagnosis not present

## 2016-01-24 DIAGNOSIS — J441 Chronic obstructive pulmonary disease with (acute) exacerbation: Secondary | ICD-10-CM | POA: Diagnosis not present

## 2016-01-24 MED ORDER — PREDNISONE 10 MG (21) PO TBPK: 10.0000 mg | ORAL_TABLET | Freq: Every day | ORAL | Status: DC

## 2016-01-24 MED ORDER — ALBUTEROL SULFATE HFA 108 (90 BASE) MCG/ACT IN AERS: 2.0000 | INHALATION_SPRAY | Freq: Four times a day (QID) | RESPIRATORY_TRACT | Status: DC | PRN

## 2016-01-24 NOTE — Progress Notes (Signed)
Pt transported home via car by daughter.

## 2016-01-24 NOTE — Care Management (Signed)
Discharge to home today per Dr. Tressia Miners. Qualified for home oxygen yesterday. Better today. Discussed if she would like home oxygen. States she prefers to go home without home oxygen. Daughter will transport. Shelbie Ammons RN MSN CCM Care management [phone]

## 2016-01-24 NOTE — Progress Notes (Signed)
Discussed discharge instructions and medication with pt.  No questions at this time.  IV removed.  Pt to be transported home via car by daughter.  Clarise Cruz, RN

## 2016-01-24 NOTE — Discharge Summary (Signed)
[facility] NAME: Tammy Choi    MR#:  [id number]  DATE OF BIRTH:  04-26-1946  DATE OF ADMISSION:  01/20/2016 ADMITTING PHYSICIAN: Gladstone Lighter, MD  DATE OF DISCHARGE: 01/24/16  PRIMARY CARE PHYSICIAN: Rica Mast, MD    ADMISSION DIAGNOSIS:  Hypoxia [R09.02] COPD exacerbation ([place]) [J44.1] Pneumonia involving left lung, unspecified part of lung [J18.9]  DISCHARGE DIAGNOSIS:  Active Problems:   COPD exacerbation ([place])   SECONDARY DIAGNOSIS:   Past Medical History  Diagnosis Date  . HTN (hypertension)   . COPD (chronic obstructive pulmonary disease) (HCC)     Not on home o2  . Hyperlipemia     HOSPITAL COURSE:   Tammy Choi is a 70 y.o. female with a known history of Hypertension, COPD not on home oxygen, hyperlipidemia sent in from PCP office secondary to hypoxia and worsening cough and respiratory symptoms.  #1 COPD exacerbation- needed oxygen in the hospital, not on home o2. -Able to be weaned off oxygen, borderline saturations though. -Wheezing improved. Pulmonary rehabilitation as outpatient recommended. Taper steroids as outpatient -Started on inhalers .  #2 community-acquired pneumonia- negative Blood cultures - on levaquin- continue for total of 7 days -Off oxygen today, cough meds - Incentive spirometer given  #3 hypertension- continue lisinopril  #4 hyperlipidemia-continue statin  #5 elevated troponin-demand ischemia. Plateaued troponins. No active cardiac symptoms. No EKG changes. ECHO with normal EF, diastolic dysfunction noted- mild.  #6 depression and anxiety-continue home medications. Patient is on Prozac and BuSpar   Physical therapy consulted. Patient ambulated well. Discharge today   DISCHARGE CONDITIONS:   Stable  CONSULTS OBTAINED:   None  DRUG ALLERGIES:   Allergies  Allergen Reactions  . Penicillins Anaphylaxis and Other (See Comments)     Has patient had a PCN reaction causing immediate rash, facial/tongue/throat swelling, SOB or lightheadedness with hypotension: Yes Has patient had a PCN reaction causing severe rash involving mucus membranes or skin necrosis: No Has patient had a PCN reaction that required hospitalization No Has patient had a PCN reaction occurring within the last 10 years: No If all of the above answers are "NO", then may proceed with Cephalosporin use.    DISCHARGE MEDICATIONS:   Current Discharge Medication List    START taking these medications   Details  budesonide-formoterol (SYMBICORT) 160-4.5 MCG/ACT inhaler Inhale 2 puffs into the lungs 2 (two) times daily. Qty: 1 Inhaler, Refills: 12    guaiFENesin-codeine 100-10 MG/5ML syrup Take 10 mLs by mouth every 6 (six) hours as needed for cough. Qty: 118 mL, Refills: 0    levofloxacin (LEVAQUIN) 500 MG tablet Take 1 tablet (500 mg total) by mouth daily. X 4 more days Qty: 4 tablet, Refills: 0    predniSONE (STERAPRED UNI-PAK 21 TAB) 10 MG (21) TBPK tablet Take 1 tablet (10 mg total) by mouth daily. 5 tabs PO x 1 day 4 tabs PO x 1 day 3 tabs PO x 1 day 2 tabs PO x 1 day 1 tab PO x 1 day and stop Qty: 15 tablet, Refills: 0      CONTINUE these medications which have CHANGED   Details  albuterol (PROAIR HFA) 108 (90 Base) MCG/ACT inhaler Inhale 2 puffs into the lungs every 6 (six) hours as needed for wheezing or shortness of breath. Qty: 1 Inhaler, Refills: 2      CONTINUE these medications which have NOT CHANGED   Details  atorvastatin (LIPITOR) 20 MG tablet Take 20  mg by mouth at bedtime.    busPIRone (BUSPAR) 5 MG tablet Take 1 tablet (5 mg total) by mouth 3 (three) times daily. Qty: 90 tablet, Refills: 1    cholecalciferol (VITAMIN D) 1000 UNITS tablet Take 2,000 Units by mouth daily.    FLUoxetine (PROZAC) 40 MG capsule Take 1 capsule (40 mg total) by mouth daily. Qty: 90 capsule, Refills: 3    lisinopril (PRINIVIL,ZESTRIL) 40 MG  tablet Take 1 tablet (40 mg total) by mouth daily. Qty: 90 tablet, Refills: 3    Multiple Vitamins-Minerals (MULTIVITAMIN WITH MINERALS) tablet Take 1 tablet by mouth daily.         DISCHARGE INSTRUCTIONS:   1. PCP f/u in 1-2 weeks 2. No Smoking  If you experience worsening of your admission symptoms, develop shortness of breath, life threatening emergency, suicidal or homicidal thoughts you must seek medical attention immediately by calling 911 or calling your MD immediately  if symptoms less severe.  You Must read complete instructions/literature along with all the possible adverse reactions/side effects for all the Medicines you take and that have been prescribed to you. Take any new Medicines after you have completely understood and accept all the possible adverse reactions/side effects.   Please note  You were cared for by a hospitalist during your hospital stay. If you have any questions about your discharge medications or the care you received while you were in the hospital after you are discharged, you can call the unit and asked to speak with the hospitalist on call if the hospitalist that took care of you is not available. Once you are discharged, your primary care physician will handle any further medical issues. Please note that NO REFILLS for any discharge medications will be authorized once you are discharged, as it is imperative that you return to your primary care physician (or establish a relationship with a primary care physician if you do not have one) for your aftercare needs so that they can reassess your need for medications and monitor your lab values.    Today   CHIEF COMPLAINT:   Chief Complaint  Patient presents with  . Shortness of Breath    VITAL SIGNS:  Blood pressure 125/68, pulse 85, temperature 97.9 F (36.6 C), temperature source Oral, resp. rate 17, height 5\' 6"  (1.676 m), weight 81.194 kg (179 lb), SpO2 96 %.  I/O:   Intake/Output Summary  (Last 24 hours) at 01/24/16 0952 Last data filed at 01/23/16 1700  Gross per 24 hour  Intake    240 ml  Output      0 ml  Net    240 ml    PHYSICAL EXAMINATION:   Physical Exam  GENERAL: 70 y.o.-year-old patient sitting in the bed with no acute distress. Coughing spell after neb trt EYES: Pupils equal, round, reactive to light and accommodation. No scleral icterus. Extraocular muscles intact.  HEENT: Head atraumatic, normocephalic. Oropharynx and nasopharynx clear.  NECK: Supple, no jugular venous distention. No thyroid enlargement, no tenderness.  LUNGS: Decreased bibasilar breath sounds. Much improved wheezing, No rhonchi heard. No rales or crepitations. No use of accessory muscles of respiration.  CARDIOVASCULAR: S1, S2 normal. No murmurs, rubs, or gallops.  ABDOMEN: Soft, nontender, nondistended. Bowel sounds present. No organomegaly or mass.  EXTREMITIES: No pedal edema, cyanosis, or clubbing.  NEUROLOGIC: Cranial nerves II through XII are intact. Muscle strength 5/5 in all extremities. Sensation intact. Gait not checked.  PSYCHIATRIC: The patient is alert and oriented x 3.  SKIN:  No obvious rash, lesion, or ulcer.   DATA REVIEW:   CBC  Recent Labs Lab 01/21/16 0344  WBC 11.0  HGB 11.7*  HCT 35.7  PLT 327    Chemistries   Recent Labs Lab 01/21/16 0344  NA 136  K 4.0  CL 99*  CO2 28  GLUCOSE 165*  BUN 18  CREATININE 0.61  CALCIUM 8.7*    Cardiac Enzymes  Recent Labs Lab 01/21/16 0344  TROPONINI 0.04*    Microbiology Results  Results for orders placed or performed during the hospital encounter of 01/20/16  Culture, blood (routine x 2)     Status: None (Preliminary result)   Collection Time: 01/20/16  3:05 PM  Result Value Ref Range Status   Specimen Description BLOOD LEFT ANTECUBITAL  Final   Special Requests BOTTLES DRAWN AEROBIC AND ANAEROBIC  3CC  Final   Culture NO GROWTH 2 DAYS  Final   Report Status PENDING  Incomplete   Culture, blood (routine x 2)     Status: None (Preliminary result)   Collection Time: 01/20/16  3:05 PM  Result Value Ref Range Status   Specimen Description BLOOD RIGHT ANTECUBITAL  Final   Special Requests BOTTLES DRAWN AEROBIC AND ANAEROBIC  5CC  Final   Culture NO GROWTH 2 DAYS  Final   Report Status PENDING  Incomplete    RADIOLOGY:  No results found.  EKG:   Orders placed or performed during the hospital encounter of 01/20/16  . ED EKG  . ED EKG  . EKG 12-Lead  . EKG 12-Lead      Management plans discussed with the patient, family and they are in agreement.  CODE STATUS:     Code Status Orders        Start     Ordered   01/20/16 1719  Full code   Continuous     01/20/16 1718    Code Status History    Date Active Date Inactive Code Status Order ID Comments User Context   This patient has a current code status but no historical code status.      TOTAL TIME TAKING CARE OF THIS PATIENT: 37 minutes.    Gladstone Lighter M.D on 01/24/2016 at 9:52 AM  Between 7am to 6pm - Pager - [phone]  After 6pm go to [email or web address] - password [email or web address] [facility]  Office  [phone]  CC: Primary care physician; Rica Mast, MD

## 2016-01-24 NOTE — Care Management (Signed)
Admitted to [facility] with the diagnosis of COPD. Lives with daughter, [name] [phone] or [phone]). No home Health in the past. No skilled facility in the past. No home oxygen. Takes care of all basic and instrumental activities of daily living herself, drives. Last was in Dr. Eulogio Ditch office on Friday. No falls. Good appetite. Qualifies for home oxygen at this time. Discussed agencies with Ms. Mcevers at the bedside. [place] RN MSN CCM Care Management [phone]

## 2016-01-24 NOTE — Consult Note (Signed)
[facility] CM Inpatient Consult   01/24/2016  Tammy Choi 09/27/1946 [id number]  Chart review revealed patient eligible for [facility] Care Management services with a diagnosis of COPD for post hospital discharge follow up. Patient was evaluated for community based chronic disease management services with [facility] care Management Program as a benefit of patient's [employer]. Met with the patient at the bedside to explain [facility] Care Management services. Patient endorses her primary care provider to be Dr. Jennifer Walker. Patient states she lives with her daughter and plans to go back there at discharge. Consent form signed. Patient states the best contact number for her is [phone] and gave consent to also contact her daughter, [name] at [phone].Patient will receive post hospital discharge calls and be evaluated for monthly home visits. [facility] Care Management services does not interfere with or replace any services arranged by the inpatient care management team. RNCM left contact information and [facility] literature at the bedside. Made inpatient RNCM aware that [facility] will be following for care management. For additional questions please contact:   Janci Minor RN, BSN [facility]  Hospital Liaison  ([phone]) Business Mobile ([phone]) Toll free office 

## 2016-01-24 NOTE — Progress Notes (Signed)
Physical Therapy Treatment Patient Details Name: Tammy Choi MRN: [id number] DOB: Sep 07, 1946 Today's Date: 01/24/2016    History of Present Illness 70 yo female with onset of COPD with SOB, hypoxia and L basilar infiltrate was seen 2 weeks ago for upper respiratory infection.  Elevated troponin from demand ischemia.     PT Comments    Pt is progressing towards goals. Pt able to perform transfer from recliner w/modified independence. Pt able to ambulate approx 350 ft w/o AD and w/CGA for safety. Pt ambulated w/o O2 per MD request. Pts O2 at 93% at start of treatment. O2 dropped to 90% halfway through ambulation and down to 88% after ambulation. Pt able to perform standing balance there-ex on B LE using counter and CGA for support. Pts O2 monitored t/o there-ex, stayed at 90% and occasionally dropped to 88%. Pt expressed she did not feel SOB during any of the mobility. Pt demonstrates deficits in endurance. Pt would benefit from pulmonary rehab after discharge from acute hospitalization.   Follow Up Recommendations  Other (comment) (Pulmonary rehab, brochure provided)     Equipment Recommendations  None recommended by PT    Recommendations for Other Services Rehab consult     Precautions / Restrictions Precautions Precautions: Fall Restrictions Weight Bearing Restrictions: No    Mobility  Bed Mobility               General bed mobility comments: Received upright in recliner  Transfers Overall transfer level: Modified independent Equipment used: None Transfers: Sit to/from Stand Sit to Stand: Modified independent (Device/Increase time) Stand pivot transfers: Supervision       General transfer comment: Pt able to perform sit to stand from recliner with modified independence and supervision for safety. Pt demonstrates good speed and sequencing with transfer. Pt used UE and chair rails to rise from chair.   Ambulation/Gait Ambulation/Gait assistance:  Supervision Ambulation Distance (Feet): 350 Feet Assistive device: None Gait Pattern/deviations: Step-through pattern Gait velocity: Decreased but functional for limited community distances   General Gait Details: Pt able to ambulate approx 350 ft with supervision. Pt demonstrates step-through gait pattern and adequate speed. Pt ambulated w/o O2 per MD request. Pt O2 at 90% halfway through ambulation, and 88% after ambulation. Pt did not express feeling SOB.   Stairs            Wheelchair Mobility    Modified Rankin (Stroke Patients Only)       Balance Overall balance assessment: Modified Independent Sitting-balance support: Feet supported Sitting balance-Leahy Scale: Good     Standing balance support: No upper extremity supported Standing balance-Leahy Scale: Fair Standing balance comment: Pt able to maintain standing balance without UE support. Pt also able to perform dynamic standing balance w/ther-ex with CGA for safety.                    Cognition Arousal/Alertness: Awake/alert Behavior During Therapy: WFL for tasks assessed/performed Overall Cognitive Status: Within Functional Limits for tasks assessed                      Exercises Other Exercises Other Exercises: Pt able to peform standing balance ther-ex including hip ab/ad, bilateral heel raises, and mini squats. All ther-ex performed x2 sets of x10 reps. Pt also able to perform standing forward reaches while mainitaining standing balance. Performed x10 on each UE. Pt required support of counter during all ther-ex.     General Comments  Pertinent Vitals/Pain Pain Assessment: No/denies pain    Home Living                      Prior Function            PT Goals (current goals can now be found in the care plan section) Acute Rehab PT Goals Patient Stated Goal: to walk with more independence PT Goal Formulation: With patient Time For Goal Achievement:  02/05/16 Potential to Achieve Goals: Good Progress towards PT goals: Progressing toward goals    Frequency  Min 2X/week    PT Plan Current plan remains appropriate    Co-evaluation             End of Session Equipment Utilized During Treatment: Gait belt Activity Tolerance: Patient tolerated treatment well Patient left: in chair;with call bell/phone within reach;with nursing/sitter in room     Time: 0900-0923 PT Time Calculation (min) (ACUTE ONLY): 23 min  Charges:  $Gait Training: 8-22 mins $Therapeutic Exercise: 8-22 mins                    G Codes:      Sherral Hammers 05-Feb-2016, 1:24 PM M. Barnett Abu, SPT

## 2016-01-25 ENCOUNTER — Telehealth: Payer: Self-pay

## 2016-01-25 ENCOUNTER — Encounter: Payer: Self-pay | Admitting: *Deleted

## 2016-01-25 ENCOUNTER — Other Ambulatory Visit: Payer: Self-pay | Admitting: *Deleted

## 2016-01-25 LAB — CULTURE, BLOOD (ROUTINE X 2)
CULTURE: NO GROWTH
CULTURE: NO GROWTH

## 2016-01-25 NOTE — Telephone Encounter (Signed)
Transition Care Management Follow-up Telephone Call   Date discharged? 01/24/16   How have you been since you were released from the hospital? Still weak, but doing okay.  No pain. No dizziness.  No SOB.  No wheezing.  Some cough.  Using incentive spirometer and inhalers.  Eating/drinking without issues.   Do you understand why you were in the hospital? YES, I was having trouble breathing and pneumonia.   Do you understand the discharge instructions? YES, not smoking and following up with PCP.   Where were you discharged to? HOME.   Items Reviewed:  Medications reviewed: YES, started taking levaquin 500mg , prednisone 10mg  taper, guaifenesin 100mg  syrup, and symbicort inhaler without issues.  Continuing all other scheduled home medications as directed.  Allergies reviewed: YES, no changes.  Dietary changes reviewed: YES, regular diet, no problems.  Referrals reviewed: YES, appointment scheduled with PCP.  Discuss pulmonary rehabilitation with provider.   Functional Questionnaire:   Activities of Daily Living (ADLs):   She states they are independent in the following: Independent in all ADLs. States they require assistance with the following: No assistance required at this time.   Any transportation issues/concerns?: NO.   Any patient concerns? No, not at this time.   Confirmed importance and date/time of follow-up visits scheduled YES, appointment scheduled 01/30/16 at 1:30pm.  Provider Appointment booked with Dr. Gilford Rile (PCP).  Confirmed with patient if condition begins to worsen call PCP or go to the ER.  Patient was given the office number and encouraged to call back with question or concerns.  : YES, patient verbalized understanding.

## 2016-01-25 NOTE — Telephone Encounter (Signed)
Unable to reach patient.  Attempt to follow up with transitional care management.  No answer.  Left voicemail to return my call.  Appointment scheduled with PCP 01/30/16.  Will continue to follow as appropriate.

## 2016-01-25 NOTE — Patient Outreach (Addendum)
[facility]) Care Management  01/25/2016  ASHELYN CROMLEY June 02, 1946 [id number]   Initial transition of care call  RNCM placed a  call to patient as part of the transition of care program, HIPAA verified. Mrs.Lingafelter reports that she is doing good on today.  Patient denies increased shortness of breath, she still has a cough but her sputum is decreasing. Educated patient on importance of notifying MD for worsening symptoms of shortness of breath, cough or sputum production.  Patient reports taking all of her medication, and that she has not had to use her albuterol inhaler since being home, patient voiced understanding about how to take her tapering dose of prednisone and importance of completing antibiotic prescription.  Mrs.Pupo has follow up appointment with PCP on 5/1 and has transportation.  Patient denies any concerns at this time .  Plan Patient will receive weekly outreach attempts for transition of care. Provided my contact number . Also provided Call a nurse line phone number. Encouraged  to call MD for any concerns or changes in condition.  [facility] CM Care Plan Problem One        Most Recent Value   Care Plan Problem One  Recent hospital admission   Role Documenting the Problem One  Care Management [place] for Problem One  Active   [facility] Long Term Goal (31-90 days)  Patient will not experience a hospital admission in the next 31 days   [facility] Long Term Goal Start Date  01/25/16   Interventions for Problem One Long Term Goal  Educated patient on [facility] and the transition of care program, provided 24 nurse line number, make contact number and plan for next outreach,    [facility] CM Short Term Goal #1 (0-30 days)  Patient will attend PCP appointment in the next 14 days   [facility] CM Short Term Goal #1 Start Date  01/25/16   Interventions for Short Term Goal #1  Encouraged patient on importance of attending post hospital PCP visit, verified that she has  transportation   [facility] CM Short Term Goal #2 (0-30 days)  Patient will be able to state  at least 3 worsening sign/symptoms fo COPD  and action to take in the next 30 days   [facility] CM Short Term Goal #2 Start Date  01/25/16   Interventions for Short Term Goal #2  Educate patient on zones  symptoms of COPD , and yellow zone symptoms and action to take and importance of notifying MD sooner        Joylene Draft, RN, [place] Care Management [phone]- Mobile [phone]- [place]

## 2016-01-25 NOTE — Addendum Note (Signed)
Addended by: Joylene Draft A on: 01/25/2016 06:19 PM   Modules accepted: Medications

## 2016-01-30 ENCOUNTER — Ambulatory Visit (INDEPENDENT_AMBULATORY_CARE_PROVIDER_SITE_OTHER): Payer: PPO | Admitting: Internal Medicine

## 2016-01-30 ENCOUNTER — Encounter: Payer: Self-pay | Admitting: Internal Medicine

## 2016-01-30 VITALS — BP 136/76 | HR 97 | Wt 178.1 lb

## 2016-01-30 DIAGNOSIS — J189 Pneumonia, unspecified organism: Secondary | ICD-10-CM

## 2016-01-30 DIAGNOSIS — J441 Chronic obstructive pulmonary disease with (acute) exacerbation: Secondary | ICD-10-CM

## 2016-01-30 NOTE — Assessment & Plan Note (Signed)
Symptoms improved. Follow up CXR ordered. Return precautions given.

## 2016-01-30 NOTE — Assessment & Plan Note (Signed)
Symptoms of recent COPD exacerbation improved. Will continue Symbicort and prn Albuterol. Encouraged continued smoking cessation.Follow up prn.

## 2016-01-30 NOTE — Progress Notes (Signed)
Subjective:    Patient ID: Tammy Choi, female    DOB: 28-Aug-1946, 70 y.o.   MRN: [id number]  HPI  69YO female presents for hospital follow up.  ADMITTED:  01/20/2016 DISCHARGED: 01/24/2016  DIAGNOSIS: COPD exacerbation, Pneumonia  Sent to ER from clinic after found to be hypoxic. See clinic notes 4/21.  Admitted for further evaluation and management. CXR showed left pneumonia. Symptoms improved inpatient with treatment with IV steroids and Levaquin. Discharged on oral Levaquin and oral steroid taper. Completed course of both. Feeling better. Less cough. Dyspnea improved. Using Albuterol and Symbicort as directed. Continues to feel some fatigue. Quit smoking.   Wt Readings from Last 3 Encounters:  01/30/16 178 lb 1.9 oz (80.795 kg)  01/20/16 179 lb (81.194 kg)  01/20/16 179 lb 12.8 oz (81.557 kg)   BP Readings from Last 3 Encounters:  01/30/16 136/76  01/24/16 125/68  01/20/16 128/70    Past Medical History  Diagnosis Date  . HTN (hypertension)   . COPD (chronic obstructive pulmonary disease) (HCC)     Not on home o2  . Hyperlipemia    Family History  Problem Relation Age of Onset  . Kidney disease Mother   . Heart disease Father   . Breast cancer Sister 28   Past Surgical History  Procedure Laterality Date  . Left leg surgery    . Abdominal hysterectomy    . Breast biopsy Left 04/30/2013    neg  . Tonsillectomy     Social History   Social History  . Marital Status: Single    Spouse Name: N/A  . Number of Children: N/A  . Years of Education: N/A   Occupational History  . Retired Chiropractor    Social History Main Topics  . Smoking status: Former Smoker -- 0.25 packs/day    Quit date: 12/20/2015  . Smokeless tobacco: Never Used     Comment: 1-2 Cigs/day  . Alcohol Use: No  . Drug Use: No  . Sexual Activity: Not Asked   Other Topics Concern  . None   Social History Narrative   Lives with daughter at home. Independent at  baseline.    Review of Systems  Constitutional: Negative for fever, chills, appetite change, fatigue and unexpected weight change.  Eyes: Negative for visual disturbance.  Respiratory: Positive for cough. Negative for chest tightness, shortness of breath and wheezing.   Cardiovascular: Negative for chest pain, palpitations and leg swelling.  Gastrointestinal: Negative for nausea, vomiting, abdominal pain, diarrhea and constipation.  Musculoskeletal: Negative for myalgias and arthralgias.  Skin: Negative for color change and rash.  Hematological: Negative for adenopathy. Does not bruise/bleed easily.  Psychiatric/Behavioral: Negative for sleep disturbance and dysphoric mood. The patient is not nervous/anxious.        Objective:    BP 136/76 mmHg  Pulse 97  Wt 178 lb 1.9 oz (80.795 kg) Physical Exam  Constitutional: She is oriented to person, place, and time. She appears well-developed and well-nourished. No distress.  HENT:  Head: Normocephalic and atraumatic.  Right Ear: External ear normal.  Left Ear: External ear normal.  Nose: Nose normal.  Mouth/Throat: Oropharynx is clear and moist. No oropharyngeal exudate.  Eyes: Conjunctivae are normal. Pupils are equal, round, and reactive to light. Right eye exhibits no discharge. Left eye exhibits no discharge. No scleral icterus.  Neck: Normal range of motion. Neck supple. No tracheal deviation present. No thyromegaly present.  Cardiovascular: Normal rate, regular rhythm, normal heart sounds and intact distal  pulses.  Exam reveals no gallop and no friction rub.   No murmur heard. Pulmonary/Chest: Effort normal. No accessory muscle usage. No tachypnea. No respiratory distress. She has decreased breath sounds (prolonged expiration). She has no wheezes. She has rhonchi (rare scattered). She has no rales. She exhibits no tenderness.  Musculoskeletal: Normal range of motion. She exhibits no edema or tenderness.  Lymphadenopathy:    She has  no cervical adenopathy.  Neurological: She is alert and oriented to person, place, and time. No cranial nerve deficit. She exhibits normal muscle tone. Coordination normal.  Skin: Skin is warm and dry. No rash noted. She is not diaphoretic. No erythema. No pallor.  Psychiatric: She has a normal mood and affect. Her behavior is normal. Judgment and thought content normal.          Assessment & Plan:   Problem List Items Addressed This Visit      Unprioritized   CAP (community acquired pneumonia)    Symptoms improved. Follow up CXR ordered. Return precautions given.      Relevant Orders   DG Chest 2 View   COPD exacerbation ([place]) - Primary    Symptoms of recent COPD exacerbation improved. Will continue Symbicort and prn Albuterol. Encouraged continued smoking cessation.Follow up prn.          Return in about 4 weeks (around 02/27/2016) for Recheck.  Ronette Deter, MD Internal Medicine [facility]

## 2016-01-30 NOTE — Progress Notes (Signed)
Pre visit review using our clinic review tool, if applicable. No additional management support is needed unless otherwise documented below in the visit note. 

## 2016-01-30 NOTE — Patient Instructions (Addendum)
Chest xray at [facility] at your convenience this week.  Follow up in 4 weeks or sooner as needed.

## 2016-02-01 ENCOUNTER — Other Ambulatory Visit: Payer: Self-pay | Admitting: *Deleted

## 2016-02-01 ENCOUNTER — Encounter: Payer: Self-pay | Admitting: *Deleted

## 2016-02-01 NOTE — Patient Outreach (Signed)
[facility]) Care Management  02/01/2016  Tammy Choi 02/07/1946 [id number]   Transition of care call  Spoke with [name] that reports she is doing well. Patient denies increased shortness of breath, cough or sputum productions. Reviewed worsening symptoms of COPD and action to take. Denies increase need for albuterol rescue inhaler.  Discussed patient recent smoking cessation , offered encouragement and discussed benefits.   Patient denies any new concerns, she has all of her medications and taking as prescribed,  Plan Patient will remain active with transition of care outreaches, initial home visit is scheduled in a week. [facility] CM Care Plan Problem One        Most Recent Value   Care Plan Problem One  Recent hospital admission   Role Documenting the Problem One  Care Management [place] for Problem One  Active   THN Long Term Goal (31-90 days)  Patient will not experience a hospital admission in the next 31 days   THN Long Term Goal Start Date  01/25/16   Interventions for Problem One Long Term Goal  Reinforced with patient to call MD for concerns sooner to arrange a office visit.   THN CM Short Term Goal #1 (0-30 days)  Patient will attend PCP appointment in the next 14 days   THN CM Short Term Goal #1 Start Date  01/25/16   [facility] CM Short Term Goal #1 Met Date  02/01/16   THN CM Short Term Goal #2 (0-30 days)  Patient will be able to state  at least 3 worsening sign/symptoms fo COPD  and action to take in the next 30 days   THN CM Short Term Goal #2 Start Date  01/25/16   Interventions for Short Term Goal #2  Reviewed education on worsening symptoms in yellow zone to notify MD, and red zone to call [place], RN, [place] Management [phone]- Mobile [phone]- [place]

## 2016-02-03 ENCOUNTER — Ambulatory Visit (INDEPENDENT_AMBULATORY_CARE_PROVIDER_SITE_OTHER)
Admission: RE | Admit: 2016-02-03 | Discharge: 2016-02-03 | Disposition: A | Discharge status: Home / Self Care | Payer: PPO | Source: Ambulatory Visit | Attending: Internal Medicine | Admitting: Internal Medicine

## 2016-02-03 ENCOUNTER — Telehealth: Payer: Self-pay

## 2016-02-03 ENCOUNTER — Other Ambulatory Visit: Payer: Self-pay | Admitting: Internal Medicine

## 2016-02-03 DIAGNOSIS — R9389 Abnormal findings on diagnostic imaging of other specified body structures: Secondary | ICD-10-CM

## 2016-02-03 DIAGNOSIS — J189 Pneumonia, unspecified organism: Secondary | ICD-10-CM | POA: Diagnosis not present

## 2016-02-03 NOTE — Telephone Encounter (Signed)
-----   Message from Jackolyn Confer, MD sent at 02/03/2016  2:24 PM EDT ----- Chest xray shows persistent density in the left lung. This may be from infection. I would like to order a CT of the chest to clarify. I will place this order to be scheduled.

## 2016-02-03 NOTE — Telephone Encounter (Signed)
Left message for patient to call office regarding xray results and recommendations.

## 2016-02-08 ENCOUNTER — Encounter: Payer: Self-pay | Admitting: Internal Medicine

## 2016-02-08 ENCOUNTER — Other Ambulatory Visit: Payer: Self-pay | Admitting: *Deleted

## 2016-02-08 ENCOUNTER — Ambulatory Visit (INDEPENDENT_AMBULATORY_CARE_PROVIDER_SITE_OTHER): Payer: PPO | Admitting: Internal Medicine

## 2016-02-08 VITALS — BP 132/74 | HR 83 | Temp 98.9°F | Ht 63.2 in | Wt 180.0 lb

## 2016-02-08 DIAGNOSIS — J189 Pneumonia, unspecified organism: Secondary | ICD-10-CM

## 2016-02-08 LAB — CBC WITH DIFFERENTIAL/PLATELET
BASOS ABS: 0 10*3/uL (ref 0.0–0.1)
Basophils Relative: 0.3 % (ref 0.0–3.0)
EOS ABS: 0.2 10*3/uL (ref 0.0–0.7)
Eosinophils Relative: 2.1 % (ref 0.0–5.0)
HCT: 40.5 % (ref 36.0–46.0)
Hemoglobin: 13.2 g/dL (ref 12.0–15.0)
LYMPHS ABS: 2.6 10*3/uL (ref 0.7–4.0)
Lymphocytes Relative: 28.4 % (ref 12.0–46.0)
MCHC: 32.6 g/dL (ref 30.0–36.0)
MCV: 85.4 fl (ref 78.0–100.0)
Monocytes Absolute: 0.5 10*3/uL (ref 0.1–1.0)
Monocytes Relative: 6.1 % (ref 3.0–12.0)
NEUTROS ABS: 5.7 10*3/uL (ref 1.4–7.7)
NEUTROS PCT: 63.1 % (ref 43.0–77.0)
PLATELETS: 218 10*3/uL (ref 150.0–400.0)
RBC: 4.75 Mil/uL (ref 3.87–5.11)
RDW: 15.3 % (ref 11.5–15.5)
WBC: 9 10*3/uL (ref 4.0–10.5)

## 2016-02-08 LAB — COMPREHENSIVE METABOLIC PANEL
ALT: 17 U/L (ref 0–35)
AST: 19 U/L (ref 0–37)
Albumin: 3.8 g/dL (ref 3.5–5.2)
Alkaline Phosphatase: 73 U/L (ref 39–117)
BILIRUBIN TOTAL: 0.3 mg/dL (ref 0.2–1.2)
BUN: 20 mg/dL (ref 6–23)
CHLORIDE: 102 meq/L (ref 96–112)
CO2: 29 meq/L (ref 19–32)
CREATININE: 0.86 mg/dL (ref 0.40–1.20)
Calcium: 9.2 mg/dL (ref 8.4–10.5)
GFR: 69.22 mL/min (ref >60.00)
Glucose, Bld: 94 mg/dL (ref 70–99)
Potassium: 4.8 mEq/L (ref 3.5–5.1)
Sodium: 137 mEq/L (ref 135–145)
Total Protein: 6.6 g/dL (ref 6.0–8.3)

## 2016-02-08 NOTE — Patient Outreach (Signed)
[facility]) Care Management  02/08/2016  Tammy Choi Aug 03, 1946 [id number]   Transition of care call, Rescheduled home visit.  Subjective : RN placed call to patient , spoke with Tammy Choi is reports that she is doing well except some tiredness. Tammy Choi discussed her office visit with PCP on today, and concern regarding chest xray result. Patient has CT of chest scheduled for 5/11, and scheduled initial home visit with community care coordinator, discussed with patient rescheduling home visit, patient in agreement and had planned to call with same concern.  Assessment  Patient managing well at home, she continues to not smoke, denies increase in COPD symptoms, still has a cough but it has not increased.  Patient denies any new concerns at this time.  Plan Patient will remain active with transition of care program and weekly outreaches, with home visit scheduled in next week. Patient will notify MD for worsening symptoms of COPD, increased cough , sputum production and shortness of breath  THN CM Care Plan Problem One        Most Recent Value   Care Plan Problem One  Recent hospital admission   Role Documenting the Problem One  Care Management [place] for Problem One  Active   THN Long Term Goal (31-90 days)  Patient will not experience a hospital admission in the next 31 days   THN Long Term Goal Start Date  01/25/16   Interventions for Problem One Long Term Goal  Reviewed with patient the importance of notifying MD sooner of concerns of worsening symptoms, taking medcaition as prescribed and PCP follow up,   THN CM Short Term Goal #1 (0-30 days)  Patient will attend PCP appointment in the next 14 days   THN CM Short Term Goal #1 Start Date  01/25/16   [facility] CM Short Term Goal #1 Met Date  02/01/16   THN CM Short Term Goal #2 (0-30 days)  Patient will be able to state  at least 3 worsening sign/symptoms fo COPD  and action to take in  the next 30 days   THN CM Short Term Goal #2 Start Date  01/25/16   Interventions for Short Term Goal #2  Reinforced education on yellow zone symptoms to notify MD of in yellow zone and action plan to follow     Joylene Draft, RN, [place] Management [phone]- Mobile [phone]- [place] .

## 2016-02-08 NOTE — Progress Notes (Signed)
Subjective:    Patient ID: Tammy Choi, female    DOB: 07/05/1946, 70 y.o.   MRN: [id number]  HPI  69YO female presents for follow up.  Recently hospitalized with COPD exacerbation and pneumonia. CXR showed persistent opacity in the left lung, recommended CT scan. This is scheduled.  Continues to have some fatigue. Occasional cough. No fever, chills, chest pain. Mild chronic dyspnea.   Wt Readings from Last 3 Encounters:  02/08/16 180 lb (81.647 kg)  01/30/16 178 lb 1.9 oz (80.795 kg)  01/20/16 179 lb (81.194 kg)   BP Readings from Last 3 Encounters:  02/08/16 132/74  01/30/16 136/76  01/24/16 125/68    Past Medical History  Diagnosis Date  . HTN (hypertension)   . COPD (chronic obstructive pulmonary disease) (HCC)     Not on home o2  . Hyperlipemia    Family History  Problem Relation Age of Onset  . Kidney disease Mother   . Heart disease Father   . Breast cancer Sister 9   Past Surgical History  Procedure Laterality Date  . Left leg surgery    . Abdominal hysterectomy    . Breast biopsy Left 04/30/2013    neg  . Tonsillectomy     Social History   Social History  . Marital Status: Single    Spouse Name: N/A  . Number of Children: N/A  . Years of Education: N/A   Occupational History  . Retired Chiropractor    Social History Main Topics  . Smoking status: Former Smoker -- 0.25 packs/day    Quit date: 12/20/2015  . Smokeless tobacco: Never Used     Comment: 1-2 Cigs/day  . Alcohol Use: No  . Drug Use: No  . Sexual Activity: Not Asked   Other Topics Concern  . None   Social History Narrative   Lives with daughter at home. Independent at baseline.    Review of Systems  Constitutional: Positive for fatigue. Negative for fever, chills and unexpected weight change.  HENT: Negative for congestion, ear discharge, ear pain, facial swelling, hearing loss, mouth sores, nosebleeds, postnasal drip, rhinorrhea, sinus pressure, sneezing,  sore throat, tinnitus, trouble swallowing and voice change.   Eyes: Negative for pain, discharge, redness and visual disturbance.  Respiratory: Positive for cough. Negative for chest tightness, shortness of breath, wheezing and stridor.   Cardiovascular: Negative for chest pain, palpitations and leg swelling.  Musculoskeletal: Negative for myalgias, arthralgias, neck pain and neck stiffness.  Skin: Negative for color change and rash.  Neurological: Negative for dizziness, weakness, light-headedness and headaches.  Hematological: Negative for adenopathy.       Objective:    BP 132/74 mmHg  Pulse 83  Temp(Src) 98.9 F (37.2 C) (Oral)  Ht 5' 3.2" (1.605 m)  Wt 180 lb (81.647 kg)  BMI 31.69 kg/m2  SpO2 94% Physical Exam  Constitutional: She is oriented to person, place, and time. She appears well-developed and well-nourished. No distress.  HENT:  Head: Normocephalic and atraumatic.  Right Ear: External ear normal.  Left Ear: External ear normal.  Nose: Nose normal.  Mouth/Throat: Oropharynx is clear and moist. No oropharyngeal exudate.  Eyes: Conjunctivae are normal. Pupils are equal, round, and reactive to light. Right eye exhibits no discharge. Left eye exhibits no discharge. No scleral icterus.  Neck: Normal range of motion. Neck supple. No tracheal deviation present. No thyromegaly present.  Cardiovascular: Normal rate, regular rhythm, normal heart sounds and intact distal pulses.  Exam reveals no gallop  and no friction rub.   No murmur heard. Pulmonary/Chest: Effort normal and breath sounds normal. No accessory muscle usage. No tachypnea. No respiratory distress. She has no decreased breath sounds. She has no wheezes. She has no rhonchi. She has no rales. She exhibits no tenderness.  Musculoskeletal: Normal range of motion. She exhibits no edema or tenderness.  Lymphadenopathy:    She has no cervical adenopathy.  Neurological: She is alert and oriented to person, place, and  time. No cranial nerve deficit. She exhibits normal muscle tone. Coordination normal.  Skin: Skin is warm and dry. No rash noted. She is not diaphoretic. No erythema. No pallor.  Psychiatric: She has a normal mood and affect. Her behavior is normal. Judgment and thought content normal.          Assessment & Plan:   Problem List Items Addressed This Visit      Unprioritized   CAP (community acquired pneumonia) - Primary    Continues to have some fatigue and cough. CXR showed persistent left lung opacity. Will get CT chest for further evaluation. CBC and CMP today. Encouraged smoking cessation.      Relevant Orders   CBC with Differential/Platelet   Comprehensive metabolic panel       Return in about 2 weeks (around 02/22/2016) for Recheck.  Ronette Deter, MD Internal Medicine [facility]

## 2016-02-08 NOTE — Assessment & Plan Note (Signed)
Continues to have some fatigue and cough. CXR showed persistent left lung opacity. Will get CT chest for further evaluation. CBC and CMP today. Encouraged smoking cessation.

## 2016-02-08 NOTE — Patient Instructions (Addendum)
We will follow up with you after your chest CT tomorrow.  Labs today.

## 2016-02-08 NOTE — Progress Notes (Signed)
Pre visit review using our clinic review tool, if applicable. No additional management support is needed unless otherwise documented below in the visit note. 

## 2016-02-09 ENCOUNTER — Ambulatory Visit: Payer: Self-pay | Admitting: *Deleted

## 2016-02-09 ENCOUNTER — Ambulatory Visit
Admission: RE | Admit: 2016-02-09 | Discharge: 2016-02-09 | Disposition: A | Discharge status: Home / Self Care | Payer: PPO | Source: Ambulatory Visit | Attending: Internal Medicine | Admitting: Internal Medicine

## 2016-02-09 DIAGNOSIS — R918 Other nonspecific abnormal finding of lung field: Secondary | ICD-10-CM | POA: Insufficient documentation

## 2016-02-09 DIAGNOSIS — K449 Diaphragmatic hernia without obstruction or gangrene: Secondary | ICD-10-CM | POA: Diagnosis not present

## 2016-02-09 DIAGNOSIS — R0602 Shortness of breath: Secondary | ICD-10-CM | POA: Diagnosis not present

## 2016-02-09 DIAGNOSIS — I2510 Atherosclerotic heart disease of native coronary artery without angina pectoris: Secondary | ICD-10-CM | POA: Insufficient documentation

## 2016-02-09 DIAGNOSIS — R938 Abnormal findings on diagnostic imaging of other specified body structures: Secondary | ICD-10-CM | POA: Diagnosis not present

## 2016-02-09 DIAGNOSIS — R9389 Abnormal findings on diagnostic imaging of other specified body structures: Secondary | ICD-10-CM

## 2016-02-14 ENCOUNTER — Other Ambulatory Visit: Payer: Self-pay | Admitting: Internal Medicine

## 2016-02-14 ENCOUNTER — Encounter: Payer: Self-pay | Admitting: *Deleted

## 2016-02-14 ENCOUNTER — Other Ambulatory Visit: Payer: Self-pay | Admitting: *Deleted

## 2016-02-14 DIAGNOSIS — R9389 Abnormal findings on diagnostic imaging of other specified body structures: Secondary | ICD-10-CM

## 2016-02-14 NOTE — Patient Outreach (Addendum)
[facility]) Care Management   02/14/2016  Tammy Choi 1946/08/07 [id number]  Tammy Choi is an 70 y.o. female   Initial home visit as part of transition of care  Subjective: " I told you I was doing fine" Patient discussed recent CT of chest and being eager to get results. Reports her breathing is good, occasional cough, resting well at night and appetite good.  Objective:  BP 122/68 mmHg  Pulse 77  Resp 18  SpO2 95%  Patient independent with activities in home. Review of Systems  Constitutional: Negative.   HENT: Negative.   Eyes: Negative.   Respiratory: Positive for cough.        Occasional cough  Cardiovascular: Negative.   Gastrointestinal: Negative.   Genitourinary: Negative.   Musculoskeletal: Negative.   Skin: Negative.   Neurological: Negative.   Psychiatric/Behavioral: Negative.     Physical Exam  Constitutional: She is oriented to person, place, and time. She appears well-developed and well-nourished.  Cardiovascular: Normal rate, normal heart sounds and intact distal pulses.   Respiratory: Effort normal. She has no wheezes. She has no rales.  GI: Soft.  Neurological: She is alert and oriented to person, place, and time.  Skin: Skin is warm and dry.  Psychiatric: She has a normal mood and affect. Her behavior is normal. Judgment and thought content normal.    Encounter Medications:   Outpatient Encounter Prescriptions as of 02/14/2016  Medication Sig  . albuterol (PROAIR HFA) 108 (90 Base) MCG/ACT inhaler Inhale 2 puffs into the lungs every 6 (six) hours as needed for wheezing or shortness of breath.  Marland Kitchen atorvastatin (LIPITOR) 20 MG tablet Take 20 mg by mouth at bedtime.  . budesonide-formoterol (SYMBICORT) 160-4.5 MCG/ACT inhaler Inhale 2 puffs into the lungs 2 (two) times daily.  . busPIRone (BUSPAR) 5 MG tablet Take 1 tablet (5 mg total) by mouth 3 (three) times daily.  . cholecalciferol (VITAMIN D) 1000 UNITS tablet Take  2,000 Units by mouth daily.  Marland Kitchen FLUoxetine (PROZAC) 40 MG capsule Take 1 capsule (40 mg total) by mouth daily.  Marland Kitchen lisinopril (PRINIVIL,ZESTRIL) 40 MG tablet Take 1 tablet (40 mg total) by mouth daily.  . Multiple Vitamins-Minerals (MULTIVITAMIN WITH MINERALS) tablet Take 1 tablet by mouth daily.  Marland Kitchen guaiFENesin-codeine 100-10 MG/5ML syrup Take 10 mLs by mouth every 6 (six) hours as needed for cough. Reported on 02/14/2016   No facility-administered encounter medications on file as of 02/14/2016.    Functional Status:   In your present state of health, do you have any difficulty performing the following activities: 02/01/2016 01/20/2016  Hearing? N N  Vision? N N  Difficulty concentrating or making decisions? N N  Walking or climbing stairs? N N  Dressing or bathing? N N  Doing errands, shopping? N N  Preparing Food and eating ? N -  Using the Toilet? N -  In the past six months, have you accidently leaked urine? N -  Do you have problems with loss of bowel control? N -  Managing your Medications? N -  Managing your Finances? N -  Housekeeping or managing your Housekeeping? N -    Fall/Depression Screening:    PHQ 2/9 Scores 02/01/2016 10/06/2015 07/30/2014 07/27/2013  PHQ - 2 Score 0 0 0 0   Fall Risk  02/01/2016 10/06/2015 07/30/2014 07/27/2013  Falls in the past year? No No No No   Assessment:   Patient was recently discharged from hospital and all medications have been reviewed.  COPD Patient denies increased in shortness of breath , has occasional cough, reports using rescue inhaler at least twice in the last 4 days.  Mrs.Graig continues to not smoke, encouraged to continue cessation reviewed benefits with [facility] handout. Reviewed COPD Zones patient indicates she is in the green zone on today.  Patient denies any care management needs at this time, discussed health coach program with patient and she is in agreement with transition at end of transition of care  program.  Hypertension Patient reports taking her medication as prescribed,and not adding salt to her food. Provided and reviewed low and high salt foods along with label reading and sodium limits.  Plan:  Will place final transition of care call to patient on next week and transition to health coach for continued education and management of COPD if no new care management needs identified.   [facility] CM Care Plan Problem One        Most Recent Value   Care Plan Problem One  Recent hospital admission   Role Documenting the Problem One  Care Management [place] for Problem One  Active   THN Long Term Goal (31-90 days)  Patient will not experience a hospital admission in the next 31 days   THN Long Term Goal Start Date  01/25/16   Interventions for Problem One Long Term Goal  Reinforced  with patient the importance of notifying MD sooner of concerns of worsening symptoms, taking medication as prescribed and PCP follow up,   THN CM Short Term Goal #1 (0-30 days)  Patient will attend PCP appointment in the next 14 days   THN CM Short Term Goal #1 Start Date  01/25/16   [facility] CM Short Term Goal #1 Met Date  02/01/16   THN CM Short Term Goal #2 (0-30 days)  Patient will be able to state  at least 3 worsening sign/symptoms fo COPD  and action to take in the next 30 days   THN CM Short Term Goal #2 Start Date  01/25/16   Interventions for Short Term Goal #2  Provided and reviewed [facility] handout of COPD,, when to get help, and COPD Zone magnet     Joylene Draft, RN, [place] Management [phone]- Mobile [phone]- [place]

## 2016-02-15 ENCOUNTER — Telehealth: Payer: Self-pay

## 2016-02-15 NOTE — Telephone Encounter (Signed)
-----   Message from Jackolyn Confer, MD sent at 02/14/2016  6:05 PM EDT ----- Please let pt know I spoke with Dr. Alva Garnet. We will set her up to see him in clinic to make decision about additional testing.

## 2016-02-15 NOTE — Telephone Encounter (Signed)
Patient aware.

## 2016-02-16 ENCOUNTER — Encounter: Payer: Self-pay | Admitting: Pulmonary Disease

## 2016-02-21 ENCOUNTER — Encounter (INDEPENDENT_AMBULATORY_CARE_PROVIDER_SITE_OTHER): Payer: Self-pay

## 2016-02-21 ENCOUNTER — Ambulatory Visit (INDEPENDENT_AMBULATORY_CARE_PROVIDER_SITE_OTHER): Payer: PPO | Admitting: Internal Medicine

## 2016-02-21 ENCOUNTER — Encounter: Payer: Self-pay | Admitting: Internal Medicine

## 2016-02-21 VITALS — BP 118/80 | HR 77 | Wt 180.0 lb

## 2016-02-21 DIAGNOSIS — J189 Pneumonia, unspecified organism: Secondary | ICD-10-CM | POA: Diagnosis not present

## 2016-02-21 MED ORDER — BUSPIRONE HCL 5 MG PO TABS: 5.0000 mg | ORAL_TABLET | Freq: Three times a day (TID) | ORAL | Status: DC

## 2016-02-21 NOTE — Progress Notes (Signed)
Subjective:    Patient ID: Tammy Choi, female    DOB: 28-Aug-1946, 70 y.o.   MRN: [id number]  HPI  69YO female presents for follow up.  Recently seen after episode of pneumonia. Had persistent cough. CT chest showed "prominent markings in the left lower lob suspicious for residual pneumonia" and an 27mm nodule in the right middle lobe.   She is feeling well. Cough has resolved. No further fever. Fatigue improving. She continues to abstain from smoking. No new concerns today.   Wt Readings from Last 3 Encounters:  02/21/16 180 lb (81.647 kg)  02/08/16 180 lb (81.647 kg)  01/30/16 178 lb 1.9 oz (80.795 kg)   BP Readings from Last 3 Encounters:  02/21/16 118/80  02/14/16 122/68  02/08/16 132/74    Past Medical History  Diagnosis Date  . HTN (hypertension)   . COPD (chronic obstructive pulmonary disease) (HCC)     Not on home o2  . Hyperlipemia    Family History  Problem Relation Age of Onset  . Kidney disease Mother   . Heart disease Father   . Breast cancer Sister 18   Past Surgical History  Procedure Laterality Date  . Left leg surgery    . Abdominal hysterectomy    . Breast biopsy Left 04/30/2013    neg  . Tonsillectomy     Social History   Social History  . Marital Status: Single    Spouse Name: N/A  . Number of Children: N/A  . Years of Education: N/A   Occupational History  . Retired Chiropractor    Social History Main Topics  . Smoking status: Former Smoker -- 0.25 packs/day    Quit date: 12/20/2015  . Smokeless tobacco: Never Used     Comment: 1-2 Cigs/day  . Alcohol Use: No  . Drug Use: No  . Sexual Activity: Not Asked   Other Topics Concern  . None   Social History Narrative   Lives with daughter at home. Independent at baseline.    Review of Systems  Constitutional: Negative for fever, chills, appetite change, fatigue and unexpected weight change.  Eyes: Negative for visual disturbance.  Respiratory: Negative for  cough, chest tightness, shortness of breath and wheezing.   Cardiovascular: Negative for chest pain, palpitations and leg swelling.  Gastrointestinal: Negative for nausea, vomiting, abdominal pain, diarrhea and constipation.  Skin: Negative for color change and rash.  Hematological: Negative for adenopathy. Does not bruise/bleed easily.  Psychiatric/Behavioral: Negative for dysphoric mood. The patient is not nervous/anxious.        Objective:    BP 118/80 mmHg  Pulse 77  Wt 180 lb (81.647 kg)  SpO2 94% Physical Exam  Constitutional: She is oriented to person, place, and time. She appears well-developed and well-nourished. No distress.  HENT:  Head: Normocephalic and atraumatic.  Right Ear: External ear normal.  Left Ear: External ear normal.  Nose: Nose normal.  Mouth/Throat: Oropharynx is clear and moist. No oropharyngeal exudate.  Eyes: Conjunctivae are normal. Pupils are equal, round, and reactive to light. Right eye exhibits no discharge. Left eye exhibits no discharge. No scleral icterus.  Neck: Normal range of motion. Neck supple. No tracheal deviation present. No thyromegaly present.  Cardiovascular: Normal rate, regular rhythm, normal heart sounds and intact distal pulses.  Exam reveals no gallop and no friction rub.   No murmur heard. Pulmonary/Chest: Effort normal and breath sounds normal. No accessory muscle usage. No tachypnea. No respiratory distress. She has no decreased  breath sounds. She has no wheezes. She has no rhonchi. She has no rales. She exhibits no tenderness.  Musculoskeletal: Normal range of motion. She exhibits no edema or tenderness.  Lymphadenopathy:    She has no cervical adenopathy.  Neurological: She is alert and oriented to person, place, and time. No cranial nerve deficit. She exhibits normal muscle tone. Coordination normal.  Skin: Skin is warm and dry. No rash noted. She is not diaphoretic. No erythema. No pallor.  Psychiatric: She has a normal  mood and affect. Her behavior is normal. Judgment and thought content normal.          Assessment & Plan:   Problem List Items Addressed This Visit      Unprioritized   CAP (community acquired pneumonia) - Primary    Symptoms are improving. Reviewed CT chest with pt today. Pulmonary evaluation pending for 6/1. Discussed potentially repeating Chest CT in 3 months. Follow up after pulmonary evaluation. Return precautions given.          Return in about 4 weeks (around 03/20/2016) for Recheck.  Ronette Deter, MD Internal Medicine [facility]

## 2016-02-21 NOTE — Progress Notes (Signed)
Pre visit review using our clinic review tool, if applicable. No additional management support is needed unless otherwise documented below in the visit note. 

## 2016-02-21 NOTE — Patient Instructions (Signed)
Follow up with Pulmonary as scheduled.  Follow up here in 4 weeks or sooner as needed.

## 2016-02-21 NOTE — Assessment & Plan Note (Signed)
Symptoms are improving. Reviewed CT chest with pt today. Pulmonary evaluation pending for 6/1. Discussed potentially repeating Chest CT in 3 months. Follow up after pulmonary evaluation. Return precautions given.

## 2016-02-22 ENCOUNTER — Other Ambulatory Visit: Payer: Self-pay | Admitting: *Deleted

## 2016-02-22 ENCOUNTER — Encounter: Payer: Self-pay | Admitting: *Deleted

## 2016-02-22 DIAGNOSIS — J441 Chronic obstructive pulmonary disease with (acute) exacerbation: Secondary | ICD-10-CM

## 2016-02-22 DIAGNOSIS — E785 Hyperlipidemia, unspecified: Secondary | ICD-10-CM

## 2016-02-22 DIAGNOSIS — I10 Essential (primary) hypertension: Secondary | ICD-10-CM

## 2016-02-22 NOTE — Patient Outreach (Signed)
[facility]) Care Management  02/22/2016  Tammy Choi 1945/12/16 [id number]   Transition of care call  RN spoke with Tammy Choi that reports she is doing fine, almost back to normal. Patient denies any shortness of breath, increase in  cough or sputum production.  Patient denies any new concerns at this time.  Assessment : Patient has completed transition of care program, no new identified care management needs. Patient will benefit from continued education and management of COPD and is agreeable to health coach for disease management. Patient continues with smoking cessation.  Made sure patient has contact number for [facility] toll free number, 24 nurse line and my number if needs arise.   Plan Will place order for transition to health coach for disease  management of COPD.  Joylene Draft, RN, [place] [facility] [phone]- Mobile [phone]- Toll Free Main Office

## 2016-03-01 ENCOUNTER — Ambulatory Visit (INDEPENDENT_AMBULATORY_CARE_PROVIDER_SITE_OTHER): Payer: PPO | Admitting: Pulmonary Disease

## 2016-03-01 ENCOUNTER — Encounter: Payer: Self-pay | Admitting: Pulmonary Disease

## 2016-03-01 VITALS — BP 124/78 | HR 86 | Ht 66.0 in | Wt 180.0 lb

## 2016-03-01 DIAGNOSIS — Z720 Tobacco use: Secondary | ICD-10-CM | POA: Diagnosis not present

## 2016-03-01 DIAGNOSIS — J439 Emphysema, unspecified: Secondary | ICD-10-CM

## 2016-03-01 DIAGNOSIS — J441 Chronic obstructive pulmonary disease with (acute) exacerbation: Secondary | ICD-10-CM

## 2016-03-01 DIAGNOSIS — J410 Simple chronic bronchitis: Secondary | ICD-10-CM

## 2016-03-01 DIAGNOSIS — R0902 Hypoxemia: Secondary | ICD-10-CM

## 2016-03-01 DIAGNOSIS — F17200 Nicotine dependence, unspecified, uncomplicated: Secondary | ICD-10-CM

## 2016-03-01 DIAGNOSIS — R938 Abnormal findings on diagnostic imaging of other specified body structures: Secondary | ICD-10-CM

## 2016-03-01 DIAGNOSIS — R9389 Abnormal findings on diagnostic imaging of other specified body structures: Secondary | ICD-10-CM

## 2016-03-01 MED ORDER — PREDNISONE 20 MG PO TABS: 40.0000 mg | ORAL_TABLET | Freq: Every day | ORAL | Status: DC

## 2016-03-01 NOTE — Patient Instructions (Signed)
We talked about smoking cessation We talked about the findings on your CT scan of the chest - these will need to be followed We talked about the diagnosis and evaluation of COPD Your meds for COPD should be 1) Symbicort 2 inhalations twice a day and albuterol as needed I prescribed prednisone 40 mg daily for 5 days Follow up in 4-6 weeks with lung function tests and chest Xray prior to visit

## 2016-03-05 NOTE — Progress Notes (Signed)
PULMONARY CONSULT NOTE  Requesting MD/Service: Ronette Deter Date of initial consultation: 03/01/16 Reason for consultation: CXR abnormality  PT PROFILE: 70 y.o. Choi smoker with mild chronic DOE and recent acute exacerbation. CXR revealed lingular density confirmed on subsequent CT chest. Referred for further evaluation  HPI:  Tammy Choi smoker whose baseline is mild DOE until she developed a "nasty" cold in April of this year with head and chest congestion. She was hospitalized 01/20/16 - 01/24/16 with discharge diagnoses of AECOPD and CAP. Her CXR @ that time revealed a lingular density. A subsequent CXR on on 02/03/16 revealed persistent lingular density and a CT chest was performed - this is discussed below. Se initially improved after treatment for the above problems. However, over the past 5 days, she has noted some recurrence wih UA symptoms, raspy cough. Occasional discolored mucus. She denies CP, fever, hemoptysis, and calf tenderness. She does have chronic intermittent R ankle edema, not changed recently. She continues to smoke 1-2 cigarettes per day. She is using Symbicort only once a day rather than twice a day as prescribed   Past Medical History  Diagnosis Date  . HTN (hypertension)   . COPD (chronic obstructive pulmonary disease) (HCC)     Not on home o2  . Hyperlipemia     Past Surgical History  Procedure Laterality Date  . Left leg surgery    . Abdominal hysterectomy    . Breast biopsy Left 04/30/2013    neg  . Tonsillectomy      MEDICATIONS: I have reviewed all medications and confirmed regimen as documented  Social History   Social History  . Marital Status: Single    Spouse Name: N/A  . Number of Children: N/A  . Years of Education: N/A   Occupational History  . Retired Chiropractor    Social History Main Topics  . Smoking status: Former Smoker -- 0.25 packs/day    Quit date: 12/20/2015  . Smokeless tobacco: Never Used     Comment: 1-2 Cigs/day  .  Alcohol Use: No  . Drug Use: No  . Sexual Activity: Not on file   Other Topics Concern  . Not on file   Social History Narrative   Lives with daughter at home. Independent at baseline.    Family History  Problem Relation Age of Onset  . Kidney disease Mother   . Heart disease Father   . Breast cancer Sister 40    ROS: No fever, myalgias/arthralgias, unexplained weight loss or weight gain No new focal weakness or sensory deficits No otalgia, hearing loss, visual changes, nasal and sinus symptoms, mouth and throat problems No neck pain or adenopathy No abdominal pain, N/V/D, diarrhea, change in bowel pattern No dysuria, change in urinary pattern No calf tenderness   Filed Vitals:   03/01/16 1031  BP: 124/Tammy  Pulse: 86  Height: 5\' 6"  (1.676 m)  Weight: 180 lb (81.647 kg)  SpO2: 93%     EXAM:  Gen: WDWN, No overt respiratory distress, appears older than her true age HEENT: NCAT, sclera white, oropharynx normal Neck: Supple without LAN, thyromegaly, JVD Lungs: breath sounds moderately decreased, percussion note normal, No wheezes or other adventitious sounds Cardiovascular: Normal rate, reg rhythm, no murmurs noted Abdomen: Soft, nontender, normal BS Ext: without clubbing, cyanosis, edema Neuro: CNs grossly intact, motor and sensory intact, DTRs symmetric Skin: Limited exam, no lesions noted  DATA:   BMP Latest Ref Rng 02/08/2016 01/21/2016 01/20/2016  Glucose 70 - 99 mg/dL 94 165(H)  121(H)  BUN 6 - 23 mg/dL 20 18 23(H)  Creatinine 0.40 - 1.20 mg/dL 0.86 0.61 0.84  Sodium 135 - 145 mEq/L 137 136 137  Potassium 3.5 - 5.1 mEq/L 4.8 4.0 3.7  Chloride 96 - 112 mEq/L 102 99(L) 98(L)  CO2 19 - 32 mEq/L 29 28 30   Calcium 8.4 - 10.5 mg/dL 9.2 8.7(L) 9.0    CBC Latest Ref Rng 02/08/2016 01/21/2016 01/20/2016  WBC 4.0 - 10.5 K/uL 9.0 11.0 14.3(H)  Hemoglobin 12.0 - 15.0 g/dL 13.2 11.7(L) 12.4  Hematocrit 36.0 - 46.0 % 40.5 35.7 37.9  Platelets 150.0 - 400.0 K/uL 218.0 327  346    CXR (02/03/16):  Persistent vague density in lingula  CT chest (02/09/16): Prominent markings within the left lower lobe are suspicious for residual pneumonia possibly involving the lingula as well. 2. 11 mm nodular lesion in the right middle lobe anteriorly is not seen on the prior CT. This may represent scarring but followup recommendations are given above.  IMPRESSION:     ICD-9-CM ICD-10-CM   1. Simple chronic bronchitis (HCC) 491.0 J41.0   2. Pulmonary emphysema, unspecified emphysema type (HCC) 492.8 J43.9 Pulmonary function test  3. COPD exacerbation, mild 491.21 J44.1   4. Smoker 305.1 Z72.0   5. Hypoxemia 799.02 R09.02   6. Abnormal CXR 793.2 R93.8 DG Chest 2 View     PLAN:  We talked about smoking cessation We talked about the findings on CT scan of the chest - these will need to be followed We talked about the diagnosis and evaluation of COPD Meds for COPD should be 1) Symbicort 2 inhalations twice a day and albuterol as needed I prescribed prednisone 40 mg daily for 5 days for a mild COPD exacerbation Follow up in 4-6 weeks with PFTs and chest Xray prior to visit   Merton Border, MD PCCM service Mobile [phone] Pager [phone] 03/05/2016

## 2016-04-04 ENCOUNTER — Ambulatory Visit (INDEPENDENT_AMBULATORY_CARE_PROVIDER_SITE_OTHER): Payer: PPO | Admitting: *Deleted

## 2016-04-04 ENCOUNTER — Ambulatory Visit
Admission: RE | Admit: 2016-04-04 | Discharge: 2016-04-04 | Disposition: A | Discharge status: Home / Self Care | Payer: PPO | Source: Ambulatory Visit | Attending: Pulmonary Disease | Admitting: Pulmonary Disease

## 2016-04-04 DIAGNOSIS — J449 Chronic obstructive pulmonary disease, unspecified: Secondary | ICD-10-CM | POA: Diagnosis not present

## 2016-04-04 DIAGNOSIS — R938 Abnormal findings on diagnostic imaging of other specified body structures: Secondary | ICD-10-CM | POA: Diagnosis not present

## 2016-04-04 DIAGNOSIS — R911 Solitary pulmonary nodule: Secondary | ICD-10-CM | POA: Insufficient documentation

## 2016-04-04 DIAGNOSIS — I700 Atherosclerosis of aorta: Secondary | ICD-10-CM | POA: Insufficient documentation

## 2016-04-04 DIAGNOSIS — J439 Emphysema, unspecified: Secondary | ICD-10-CM

## 2016-04-04 DIAGNOSIS — R9389 Abnormal findings on diagnostic imaging of other specified body structures: Secondary | ICD-10-CM

## 2016-04-04 LAB — PULMONARY FUNCTION TEST
DL/VA % PRED: 70 %
DL/VA: 3.48 ml/min/mmHg/L
DLCO unc % pred: 49 %
DLCO unc: 12.68 ml/min/mmHg
FEF 25-75 POST: 0.49 L/s
FEF 25-75 Pre: 0.33 L/sec
FEF2575-%Change-Post: 45 %
FEF2575-%Pred-Post: 25 %
FEF2575-%Pred-Pre: 17 %
FEV1-%CHANGE-POST: 19 %
FEV1-%PRED-PRE: 33 %
FEV1-%Pred-Post: 39 %
FEV1-PRE: 0.78 L
FEV1-Post: 0.93 L
FEV1FVC-%Change-Post: 4 %
FEV1FVC-%Pred-Pre: 64 %
FEV6-%Change-Post: 11 %
FEV6-%PRED-PRE: 53 %
FEV6-%Pred-Post: 59 %
FEV6-POST: 1.77 L
FEV6-PRE: 1.58 L
FEV6FVC-%Change-Post: -1 %
FEV6FVC-%PRED-POST: 100 %
FEV6FVC-%PRED-PRE: 102 %
FVC-%Change-Post: 13 %
FVC-%PRED-PRE: 51 %
FVC-%Pred-Post: 59 %
FVC-POST: 1.83 L
FVC-PRE: 1.61 L
POST FEV6/FVC RATIO: 97 %
PRE FEV6/FVC RATIO: 98 %
Post FEV1/FVC ratio: 51 %
Pre FEV1/FVC ratio: 49 %
RV % PRED: 180 %
RV: 4.06 L
TLC % PRED: 111 %
TLC: 5.82 L

## 2016-04-04 NOTE — Progress Notes (Signed)
PFT performed today. 

## 2016-04-06 ENCOUNTER — Encounter: Payer: Self-pay | Admitting: Pulmonary Disease

## 2016-04-06 ENCOUNTER — Ambulatory Visit (INDEPENDENT_AMBULATORY_CARE_PROVIDER_SITE_OTHER): Payer: PPO | Admitting: Pulmonary Disease

## 2016-04-06 VITALS — BP 130/90 | HR 83 | Ht 65.0 in | Wt 183.9 lb

## 2016-04-06 DIAGNOSIS — Z720 Tobacco use: Secondary | ICD-10-CM

## 2016-04-06 DIAGNOSIS — J439 Emphysema, unspecified: Secondary | ICD-10-CM | POA: Diagnosis not present

## 2016-04-06 DIAGNOSIS — Z87891 Personal history of nicotine dependence: Secondary | ICD-10-CM

## 2016-04-06 DIAGNOSIS — J449 Chronic obstructive pulmonary disease, unspecified: Secondary | ICD-10-CM | POA: Diagnosis not present

## 2016-04-06 DIAGNOSIS — F17200 Nicotine dependence, unspecified, uncomplicated: Secondary | ICD-10-CM

## 2016-04-06 MED ORDER — TIOTROPIUM BROMIDE MONOHYDRATE 18 MCG IN CAPS: 18.0000 ug | ORAL_CAPSULE | Freq: Every day | RESPIRATORY_TRACT | Status: DC

## 2016-04-06 NOTE — Progress Notes (Signed)
Patient seen in the office today and instructed on use of Spiriva respimat.  Patient expressed understanding and demonstrated technique.  

## 2016-04-06 NOTE — Patient Instructions (Signed)
Trial of Spiriva inhaler. If you feel that you have benefited from this medication, refills have been ordered. The inhaler you get from the pharmacy will be the same medication but in a different inhaler mechanism. If there are any questions, you may bring the inhaler by the office and we will go over it with you

## 2016-04-11 NOTE — Progress Notes (Signed)
PULMONARY CONSULT NOTE  Requesting MD/Service: Ronette Deter Date of initial consultation: 03/01/16 Reason for consultation: CXR abnormality  PT PROFILE: 70 y.o. F smoker with mild chronic DOE and recent acute exacerbation. CXR revealed lingular density confirmed on subsequent CT chest. Referred for further evaluation  INITIAL HPI:  69 F smoker whose baseline is mild DOE until she developed a "nasty" cold in April of this year with head and chest congestion. She was hospitalized 01/20/16 - 01/24/16 with discharge diagnoses of AECOPD and CAP. Her CXR @ that time revealed a lingular density. A subsequent CXR on on 02/03/16 revealed persistent lingular density and a CT chest was performed - this is discussed below. Se initially improved after treatment for the above problems. However, over the past 5 days, she has noted some recurrence wih UA symptoms, raspy cough. Occasional discolored mucus. She denies CP, fever, hemoptysis, and calf tenderness. She does have chronic intermittent R ankle edema, not changed recently. She continues to smoke 1-2 cigarettes per day. She is using Symbicort only once a day rather than twice a day as prescribed INITIAL IMPRESSION:   ICD-9-CM ICD-10-CM   1. Simple chronic bronchitis (HCC) 491.0 J41.0   2. Pulmonary emphysema, unspecified emphysema type (HCC) 492.8 J43.9 Pulmonary function test  3. COPD exacerbation, mild 491.21 J44.1   4. Smoker 305.1 Z72.0   5. Hypoxemia 799.02 R09.02   6. Abnormal CXR 793.2 R93.8 DG Chest 2 View  INITIAL PLAN:  We talked about smoking cessation We talked about the findings on CT scan of the chest - these will need to be followed We talked about the diagnosis and evaluation of COPD Meds for COPD should be 1) Symbicort 2 inhalations twice a day and albuterol as needed I prescribed prednisone 40 mg daily for 5 days for a mild COPD exacerbation Follow up in 4-6 weeks with PFTs and chest Xray prior to visit  PFTs 04/04/16: Severe  obstruction (FEV1 33% predicted), severe gas trapping, DLCO moderately reduced, 19% improvement in FEV1 after albuterol  ROV 04/06/16: "So much better". Added Spiriva due to significant improvement in FEV1 after bronchodilator challenge  SUBJ: Prednisone (last visit) helped a lot. In her words, she is "so much better". Reportedly not smoking at all. (some of our EPIC documentation is inaccurate). No new complaints. Denies CP, fever, purulent sputum, hemoptysis, LE edema and calf tenderness   OBJ:  Filed Vitals:   04/06/16 1150  BP: 130/90  Pulse: 83  Height: 5\' 5"  (1.651 m)  Weight: 183 lb 14.4 oz (83.416 kg)  SpO2: 94%     EXAM:  Gen: NAD HEENT: WNL Lungs: breath sounds moderately decreased, No wheezes Cardiovascular: RRR s M Abdomen: Soft, nontender, normal BS Ext: without clubbing, cyanosis, edema Neuro: grossly intact  DATA:   BMP Latest Ref Rng 02/08/2016 01/21/2016 01/20/2016  Glucose 70 - 99 mg/dL 94 165(H) 121(H)  BUN 6 - 23 mg/dL 20 18 23(H)  Creatinine 0.40 - 1.20 mg/dL 0.86 0.61 0.84  Sodium 135 - 145 mEq/L 137 136 137  Potassium 3.5 - 5.1 mEq/L 4.8 4.0 3.7  Chloride 96 - 112 mEq/L 102 99(L) 98(L)  CO2 19 - 32 mEq/L 29 28 30   Calcium 8.4 - 10.5 mg/dL 9.2 8.7(L) 9.0    CBC Latest Ref Rng 02/08/2016 01/21/2016 01/20/2016  WBC 4.0 - 10.5 K/uL 9.0 11.0 14.3(H)  Hemoglobin 12.0 - 15.0 g/dL 13.2 11.7(L) 12.4  Hematocrit 36.0 - 46.0 % 40.5 35.7 37.9  Platelets 150.0 - 400.0 K/uL 218.0 327  346   PFTs (04/04/16): As above  CXR (04/04/16): chronic accentuation of lung markings. NACPD   CT chest (02/09/16): Prominent markings within the left lower lobe are suspicious for residual pneumonia possibly involving the lingula as well. 2. 11 mm nodular lesion in the right middle lobe anteriorly is not seen on the prior CT. This may represent scarring but followup recommendations are given above.  IMPRESSION:   1) Emphysema 2) severe COPD 3) significant component of  reversible airflow obstruction on PFTs - this despite her assurance that she had used Symbicort on the morning of that test 4) Former smoker 5) RML "nodule" on CT chest 02/03/16 - indeterminate in appearance. Undetectable on plain CXR> Given severity of COPD, there is not much value in pursuing diagnosis of an asymptomatic nodule. Will follow with serial CXRs   PLAN:  Continue Symbicort Add Spiriva - sample provided and order entered ROV 3 months   Merton Border, MD PCCM service Mobile [phone] Pager [phone] 04/11/2016

## 2016-04-13 ENCOUNTER — Ambulatory Visit (INDEPENDENT_AMBULATORY_CARE_PROVIDER_SITE_OTHER): Payer: PPO | Admitting: Internal Medicine

## 2016-04-13 ENCOUNTER — Encounter: Payer: Self-pay | Admitting: Internal Medicine

## 2016-04-13 VITALS — BP 98/71 | HR 91 | Ht 66.0 in | Wt 184.0 lb

## 2016-04-13 DIAGNOSIS — I10 Essential (primary) hypertension: Secondary | ICD-10-CM

## 2016-04-13 DIAGNOSIS — J449 Chronic obstructive pulmonary disease, unspecified: Secondary | ICD-10-CM | POA: Diagnosis not present

## 2016-04-13 MED ORDER — ATORVASTATIN CALCIUM 20 MG PO TABS: 20.0000 mg | ORAL_TABLET | Freq: Every day | ORAL | Status: DC

## 2016-04-13 NOTE — Assessment & Plan Note (Signed)
BP Readings from Last 3 Encounters:  04/13/16 98/71  04/06/16 130/90  03/01/16 124/78   BP well controlled. Recent renal function normal. COntinue Lisinopril.

## 2016-04-13 NOTE — Progress Notes (Signed)
Subjective:    Patient ID: Tammy Choi, female    DOB: November 19, 1945, 70 y.o.   MRN: [id number]  HPI  69YO female presents for follow up.  Feeling well. Recently seen by pulmonary. Noted to have progression of COPD. Started on Spiriva, but broke the sample capsule. No recent chest pain. Occasional dry cough. Chronic dyspnea unchanged.  Wt Readings from Last 3 Encounters:  04/13/16 184 lb (83.462 kg)  04/06/16 183 lb 14.4 oz (83.416 kg)  03/01/16 180 lb (81.647 kg)   BP Readings from Last 3 Encounters:  04/13/16 98/71  04/06/16 130/90  03/01/16 124/78    Past Medical History  Diagnosis Date  . HTN (hypertension)   . COPD (chronic obstructive pulmonary disease) (HCC)     Not on home o2  . Hyperlipemia    Family History  Problem Relation Age of Onset  . Kidney disease Mother   . Heart disease Father   . Breast cancer Sister 56   Past Surgical History  Procedure Laterality Date  . Left leg surgery    . Abdominal hysterectomy    . Breast biopsy Left 04/30/2013    neg  . Tonsillectomy     Social History   Social History  . Marital Status: Single    Spouse Name: N/A  . Number of Children: N/A  . Years of Education: N/A   Occupational History  . Retired Chiropractor    Social History Main Topics  . Smoking status: Former Smoker -- 0.25 packs/day    Quit date: 12/20/2015  . Smokeless tobacco: Never Used     Comment: 1-2 Cigs/day  . Alcohol Use: No  . Drug Use: No  . Sexual Activity: Not on file   Other Topics Concern  . Not on file   Social History Narrative   Lives with daughter at home. Independent at baseline.    Review of Systems  Constitutional: Positive for fatigue. Negative for fever, chills, appetite change and unexpected weight change.  Eyes: Negative for visual disturbance.  Respiratory: Positive for shortness of breath (with exertion). Negative for cough, chest tightness and wheezing.   Cardiovascular: Negative for chest pain  and leg swelling.  Gastrointestinal: Negative for abdominal pain.  Musculoskeletal: Positive for myalgias and arthralgias.  Skin: Negative for color change and rash.  Hematological: Negative for adenopathy. Does not bruise/bleed easily.  Psychiatric/Behavioral: Negative for dysphoric mood. The patient is not nervous/anxious.        Objective:    BP 98/71 mmHg  Pulse 91  Ht 5\' 6"  (1.676 m)  Wt 184 lb (83.462 kg)  BMI 29.71 kg/m2  SpO2 93% Physical Exam  Constitutional: She is oriented to person, place, and time. She appears well-developed and well-nourished. No distress.  HENT:  Head: Normocephalic and atraumatic.  Right Ear: External ear normal.  Left Ear: External ear normal.  Nose: Nose normal.  Mouth/Throat: Oropharynx is clear and moist. No oropharyngeal exudate.  Eyes: Conjunctivae are normal. Pupils are equal, round, and reactive to light. Right eye exhibits no discharge. Left eye exhibits no discharge. No scleral icterus.  Neck: Normal range of motion. Neck supple. No tracheal deviation present. No thyromegaly present.  Cardiovascular: Normal rate, regular rhythm, normal heart sounds and intact distal pulses.  Exam reveals no gallop and no friction rub.   No murmur heard. Pulmonary/Chest: Effort normal. No accessory muscle usage. No tachypnea. No respiratory distress. She has decreased breath sounds. She has no wheezes. She has no rhonchi. She has  no rales. She exhibits no tenderness.  Musculoskeletal: Normal range of motion. She exhibits no edema or tenderness.  Lymphadenopathy:    She has no cervical adenopathy.  Neurological: She is alert and oriented to person, place, and time. No cranial nerve deficit. She exhibits normal muscle tone. Coordination normal.  Skin: Skin is warm and dry. No rash noted. She is not diaphoretic. No erythema. No pallor.  Psychiatric: She has a normal mood and affect. Her behavior is normal. Judgment and thought content normal.            Assessment & Plan:   Problem List Items Addressed This Visit      Unprioritized   COPD (chronic obstructive pulmonary disease) ([place]) - Primary (Chronic)    Symptoms relatively stable. Reviewed notes from Pulmonary. Will continue Symbicort and Spiriva.      Hypertension    BP Readings from Last 3 Encounters:  04/13/16 98/71  04/06/16 130/90  03/01/16 124/78   BP well controlled. Recent renal function normal. COntinue Lisinopril.      Relevant Medications   atorvastatin (LIPITOR) 20 MG tablet       Return in about 4 weeks (around 05/11/2016) for New Patient.  Ronette Deter, MD Internal Medicine [facility]

## 2016-04-13 NOTE — Patient Instructions (Signed)
Follow-up in 4 weeks

## 2016-04-13 NOTE — Assessment & Plan Note (Signed)
Symptoms relatively stable. Reviewed notes from Pulmonary. Will continue Symbicort and Spiriva.

## 2016-04-13 NOTE — Progress Notes (Signed)
Pre visit review using our clinic review tool, if applicable. No additional management support is needed unless otherwise documented below in the visit note. 

## 2016-05-14 ENCOUNTER — Ambulatory Visit: Payer: PPO | Admitting: Family Medicine

## 2016-05-21 ENCOUNTER — Other Ambulatory Visit: Payer: Self-pay | Admitting: Surgical

## 2016-05-21 MED ORDER — FLUOXETINE HCL 40 MG PO CAPS: 40.0000 mg | ORAL_CAPSULE | Freq: Every day | ORAL | 0 refills | Status: DC

## 2016-06-29 ENCOUNTER — Ambulatory Visit: Payer: PPO | Admitting: Pulmonary Disease

## 2016-07-03 ENCOUNTER — Ambulatory Visit: Payer: PPO | Admitting: Pulmonary Disease

## 2016-07-10 ENCOUNTER — Ambulatory Visit (INDEPENDENT_AMBULATORY_CARE_PROVIDER_SITE_OTHER): Payer: PPO | Admitting: Family Medicine

## 2016-07-10 ENCOUNTER — Encounter: Payer: Self-pay | Admitting: Family Medicine

## 2016-07-10 VITALS — BP 138/82 | HR 96 | Temp 97.5°F | Wt 187.0 lb

## 2016-07-10 DIAGNOSIS — M791 Myalgia, unspecified site: Secondary | ICD-10-CM | POA: Insufficient documentation

## 2016-07-10 DIAGNOSIS — Z23 Encounter for immunization: Secondary | ICD-10-CM

## 2016-07-10 DIAGNOSIS — R600 Localized edema: Secondary | ICD-10-CM | POA: Diagnosis not present

## 2016-07-10 DIAGNOSIS — F324 Major depressive disorder, single episode, in partial remission: Secondary | ICD-10-CM | POA: Diagnosis not present

## 2016-07-10 LAB — COMPREHENSIVE METABOLIC PANEL
ALT: 14 U/L (ref 0–35)
AST: 16 U/L (ref 0–37)
Albumin: 3.9 g/dL (ref 3.5–5.2)
Alkaline Phosphatase: 78 U/L (ref 39–117)
BILIRUBIN TOTAL: 0.4 mg/dL (ref 0.2–1.2)
BUN: 24 mg/dL — AB (ref 6–23)
CO2: 30 meq/L (ref 19–32)
Calcium: 9.3 mg/dL (ref 8.4–10.5)
Chloride: 102 mEq/L (ref 96–112)
Creatinine, Ser: 0.82 mg/dL (ref 0.40–1.20)
GFR: 73.04 mL/min (ref >60.00)
GLUCOSE: 98 mg/dL (ref 70–99)
Potassium: 4.3 mEq/L (ref 3.5–5.1)
SODIUM: 139 meq/L (ref 135–145)
Total Protein: 6.7 g/dL (ref 6.0–8.3)

## 2016-07-10 LAB — CK: Total CK: 117 U/L (ref 7–177)

## 2016-07-10 LAB — TSH: TSH: 1.4 u[IU]/mL (ref 0.35–4.50)

## 2016-07-10 NOTE — Progress Notes (Signed)
Pre visit review using our clinic review tool, if applicable. No additional management support is needed unless otherwise documented below in the visit note. 

## 2016-07-10 NOTE — Patient Instructions (Signed)
Nice to see you. We will have you stop your Lipitor to see if your muscle aches get better. We will check some lab work and call you with the results. You can discontinue the BuSpar. Please continue your Prozac. If you develop worsening swelling, pain, chest pain, shortness of breath, or any new or changing symptoms please seek medical attention immediately.

## 2016-07-10 NOTE — Assessment & Plan Note (Signed)
Stable at this time. No SI. Continue Prozac. We'll discontinue BuSpar as she does not have anxiety.

## 2016-07-10 NOTE — Progress Notes (Signed)
  [name], MD Phone: [phone]  Tammy Choi is a 70 y.o. female who presents today for follow-up.  Depression: Patient notes sometimes she feels depressed. She's taking Prozac. This has been very beneficial. Notes years ago she went through a hard time and had depression at that time and at that time she had some SI though she has not had SI anytime recently. Takes BuSpar once at night. No anxiety.  Patient notes bilateral legs hurt and swell. Notes right greater than left. Notes it hurts from the groin down bilaterally. Notes the swelling is better in the morning. No history of blood clot. No recent trips. No recent surgeries. No recent hospitalizations. She is not on estrogen supplementation. She is on a statin. No chest pain or shortness of breath. She notes this is been going on for a long time.  PMH: Former smoker   ROS see history of present illness  Objective  Physical Exam Vitals:   07/10/16 1055  BP: 138/82  Pulse: 96  Temp: 97.5 F (36.4 C)    BP Readings from Last 3 Encounters:  07/10/16 138/82  04/13/16 98/71  04/06/16 130/90   Wt Readings from Last 3 Encounters:  07/10/16 187 lb (84.8 kg)  04/13/16 184 lb (83.5 kg)  04/06/16 183 lb 14.4 oz (83.4 kg)    Physical Exam  Constitutional: She is well-developed, well-nourished, and in no distress.  Cardiovascular: Normal rate, regular rhythm and normal heart sounds.   Pulmonary/Chest: Effort normal and breath sounds normal.  Musculoskeletal: She exhibits edema (mild pedal edema bilaterally).  Bilateral calves 37 cm, there is tenderness in the right calf on palpation, no tenderness of the thighs laterally or the calf on the left side, there is also tenderness over the area just superior to the ankle bilaterally, there are no cords palpated  Neurological: She is alert. Gait normal.  Skin: Skin is warm and dry.     Assessment/Plan: Please see individual problem list.  Depression Stable at this  time. No SI. Continue Prozac. We'll discontinue BuSpar as she does not have anxiety.  Myalgia Patient notes muscle aches in her bilateral legs right greater than left. Also notes some swelling in her bilateral feet and ankles right greater than left. Muscle aches could be related to Lipitor. Swelling could be related to venous insufficiency. Recent echo with no systolic dysfunction. Given area the patient was tender in on the right I did discuss obtaining an ultrasound of her legs to evaluate for DVT as cause for swelling and pain though she declined this. I discussed the risks of having this undiagnosed and she noted she accepted the risks. I do not think a d-dimer would be helpful given that she is declining to proceed with ultrasound even if d-dimer is positive. We will have her stop her Lipitor. We'll check a CK and CMP. TSH to evaluate for cause of swelling. If myalgias resolve with discontinuation of Lipitor could consider a different statin. Patient is given return precautions.   Orders Placed This Encounter  Procedures  . Flu vaccine HIGH DOSE PF  . Comp Met (CMET)  . CK (Creatine Kinase)  . TSH    [name], MD [place]

## 2016-07-10 NOTE — Assessment & Plan Note (Addendum)
Patient notes muscle aches in her bilateral legs right greater than left. Also notes some swelling in her bilateral feet and ankles right greater than left. Muscle aches could be related to Lipitor. Swelling could be related to venous insufficiency. Recent echo with no systolic dysfunction. Given area the patient was tender in on the right I did discuss obtaining an ultrasound of her legs to evaluate for DVT as cause for swelling and pain though she declined this. I discussed the risks of having this undiagnosed and she noted she accepted the risks. I do not think a d-dimer would be helpful given that she is declining to proceed with ultrasound even if d-dimer is positive. We will have her stop her Lipitor. We'll check a CK and CMP. TSH to evaluate for cause of swelling. If myalgias resolve with discontinuation of Lipitor could consider a different statin. Patient is given return precautions.

## 2016-07-16 ENCOUNTER — Encounter: Payer: Self-pay | Admitting: Pulmonary Disease

## 2016-07-16 ENCOUNTER — Ambulatory Visit (INDEPENDENT_AMBULATORY_CARE_PROVIDER_SITE_OTHER): Payer: PPO | Admitting: Pulmonary Disease

## 2016-07-16 VITALS — BP 142/80 | HR 105 | Ht 62.0 in | Wt 187.0 lb

## 2016-07-16 DIAGNOSIS — J449 Chronic obstructive pulmonary disease, unspecified: Secondary | ICD-10-CM

## 2016-07-16 DIAGNOSIS — J441 Chronic obstructive pulmonary disease with (acute) exacerbation: Secondary | ICD-10-CM | POA: Diagnosis not present

## 2016-07-16 MED ORDER — METHYLPREDNISOLONE ACETATE 80 MG/ML IJ SUSP
80.0000 mg | Freq: Once | INTRAMUSCULAR | Status: AC
  Administered 2016-07-16: 80 mg via INTRAMUSCULAR

## 2016-07-16 NOTE — Patient Instructions (Signed)
Depomedrol (steroid) injection administered today  Continue Symbicort inhaler  Now is the time to be more liberal with your use of albuterol (rescue) inhaler  Call us next week if you do not feel that your breathing has returned to baseline  Follow up in 3 months and we will get PFTs just prior to that visit

## 2016-07-16 NOTE — Progress Notes (Signed)
PULMONARY OFFICE FOLLOW UP NOTE  Requesting MD/Service: Ronette Deter Date of initial consultation: 03/01/16 Reason for consultation: CXR abnormality  PT PROFILE: 70 y.o. F smoker with mild chronic DOE and recent acute exacerbation. CXR revealed lingular density confirmed on subsequent CT chest. Referred for further evaluation  DATA: CT chest 02/09/16: Prominent markings within the left lower lobe are suspicious for residual pneumonia possibly involving the lingula as well. 11 mm nodular lesion in the right middle lobe anteriorly is not seen on the prior CT. This may represent scarring but followup PFTs 04/04/16: Severe obstruction (FEV1 33% predicted), severe gas trapping, DLCO moderately reduced, 19% improvement in FEV1 after albuterol CXR (04/04/16): chronic accentuation of lung markings. NACPD   FOLLOW UPS: ROV 04/06/16: "So much better". Added Spiriva due to significant improvement in FEV1 after bronchodilator challenge ROV 07/16/16:   SUBJ: No problems since last visit until the last couple of days when she caught a URI from her granddaughter. Now with cough, mild dyspnea and chest tightness. Since last visit, she stopped Spiriva as it did not seem to make any difference. Denies CP, fever, purulent sputum, hemoptysis, LE edema and calf tenderness   OBJ:  Vitals:   07/16/16 1511  BP: (!) 142/80  Pulse: (!) 105  SpO2: 94%  Weight: 187 lb (84.8 kg)  Height: 5\' 2"  (1.575 m)     EXAM:  Gen: NAD HEENT: WNL Lungs: breath sounds moderately decreased, minimal distant wheezes Cardiovascular: RRR s M Abdomen: Soft, nontender, normal BS Ext: without clubbing, cyanosis, edema Neuro: grossly intact  DATA:     IMPRESSION:   1) Emphysema 2) severe COPD - well compensated 3) Asthmatic bronchitis with mild acute bronchospasm - likely due to URI 4) Former smoker 5) RML "nodule" on CT chest 02/03/16 - indeterminate in appearance. Undetectable on plain CXR> Given severity of  COPD, there is not much value in pursuing diagnosis of an asymptomatic nodule. Will follow with serial CXRs   PLAN:  Depomedrol 80 mg administered this encounter  Continue Symbicort Continue PRN albuterol - encouraged to use more liberally until back to baseline She is to call next week if not substantially better. Otherwise ROV 3 months   Merton Border, MD PCCM service Mobile [phone] Pager [phone] 07/16/2016

## 2016-07-20 ENCOUNTER — Other Ambulatory Visit: Payer: Self-pay | Admitting: Family Medicine

## 2016-07-20 MED ORDER — PRAVASTATIN SODIUM 20 MG PO TABS: 20.0000 mg | ORAL_TABLET | Freq: Every day | ORAL | 3 refills | Status: DC

## 2016-08-02 ENCOUNTER — Encounter: Payer: PPO | Admitting: Family Medicine

## 2016-09-05 ENCOUNTER — Other Ambulatory Visit: Payer: Self-pay | Admitting: Family Medicine

## 2016-10-09 ENCOUNTER — Encounter: Payer: Self-pay | Admitting: Family Medicine

## 2016-10-09 ENCOUNTER — Ambulatory Visit (INDEPENDENT_AMBULATORY_CARE_PROVIDER_SITE_OTHER): Payer: PPO | Admitting: Family Medicine

## 2016-10-09 VITALS — BP 130/84 | HR 90 | Temp 98.8°F | Resp 18 | Wt 189.2 lb

## 2016-10-09 DIAGNOSIS — Z1329 Encounter for screening for other suspected endocrine disorder: Secondary | ICD-10-CM

## 2016-10-09 DIAGNOSIS — M791 Myalgia, unspecified site: Secondary | ICD-10-CM

## 2016-10-09 DIAGNOSIS — Z0001 Encounter for general adult medical examination with abnormal findings: Secondary | ICD-10-CM | POA: Diagnosis not present

## 2016-10-09 DIAGNOSIS — Z13220 Encounter for screening for lipoid disorders: Secondary | ICD-10-CM

## 2016-10-09 DIAGNOSIS — Z860100 Personal history of colon polyps, unspecified: Secondary | ICD-10-CM

## 2016-10-09 DIAGNOSIS — E669 Obesity, unspecified: Secondary | ICD-10-CM

## 2016-10-09 DIAGNOSIS — Z130 Encounter for screening for diseases of the blood and blood-forming organs and certain disorders involving the immune mechanism: Secondary | ICD-10-CM | POA: Diagnosis not present

## 2016-10-09 DIAGNOSIS — R234 Changes in skin texture: Secondary | ICD-10-CM

## 2016-10-09 DIAGNOSIS — L570 Actinic keratosis: Secondary | ICD-10-CM

## 2016-10-09 MED ORDER — ROSUVASTATIN CALCIUM 5 MG PO TABS: 5.0000 mg | ORAL_TABLET | Freq: Every day | ORAL | 3 refills | Status: DC

## 2016-10-09 NOTE — Progress Notes (Signed)
[name], MD Phone: [phone]  Tammy Choi is a 71 y.o. female who presents today for physical exam.  Describes diet as not good. Does try to eat some vegetables. Not many fried foods. Walks for exercise. Due for mammogram. Possibly due for colonoscopy. She had a 1 cm tubular adenoma in 2014. No prior hepatitis C testing. Needs a tetanus vaccination. We will await the new shingles vaccination. Needs DEXA scan. Up-to-date on pneumonia vaccine and flu shot.  Continues to have some myalgias. They went away after stopping Lipitor though restarted after starting on pravastatin. Bothers her most in her right groin.  Patient additionally notes some scattered skin lesions that have been slow to heal. Notes there is one on each cheek and then there are 2 on her right breast one in the upper inner quadrant and one in the outer upper quadrant. These have been there about 6 months and do not appear to be healing.   Active Ambulatory Problems    Diagnosis Date Noted  . Hypertension 08/07/2011  . Chronic back pain 01/15/2012  . Depression 01/15/2012  . Hyperlipidemia 01/15/2012  . Medicare annual wellness visit, subsequent 07/16/2012  . COPD (chronic obstructive pulmonary disease) ([place]) 01/21/2013  . Encounter for screening colonoscopy 02/13/2013  . Breast mass, left 09/05/2012  . Tobacco use disorder 07/27/2013  . Obesity (BMI 30-39.9) 07/30/2014  . Left wrist pain 07/21/2015  . Anxiety state 07/21/2015  . Screening for breast cancer 10/06/2015  . Encounter for general adult medical examination with abnormal findings 10/06/2015  . Myalgia 07/10/2016  . Actinic keratoses 10/09/2016   Resolved Ambulatory Problems    Diagnosis Date Noted  . Lumbago 08/07/2011  . COPD with acute exacerbation ([place]) 01/06/2013  . Dyspnea on exertion 03/30/2014  . Acute bronchitis 06/22/2014  . COPD exacerbation ([place]) 01/20/2016  . CAP (community acquired pneumonia) 01/30/2016   Past  Medical History:  Diagnosis Date  . COPD (chronic obstructive pulmonary disease) ([place])   . HTN (hypertension)   . Hyperlipemia     Family History  Problem Relation Age of Onset  . Kidney disease Mother   . Heart disease Father   . Breast cancer Sister 32    Social History   Social History  . Marital status: Single    Spouse name: N/A  . Number of children: N/A  . Years of education: N/A   Occupational History  . Retired Chiropractor Retired   Social History Main Topics  . Smoking status: Former Smoker    Packs/day: 0.25    Quit date: 12/20/2015  . Smokeless tobacco: Never Used  . Alcohol use No  . Drug use: No  . Sexual activity: Not on file   Other Topics Concern  . Not on file   Social History Narrative   Lives with daughter at home. Independent at baseline.    ROS  General:  Negative for nexplained weight loss, fever Skin: Negative for new or changing mole, Positive for sore that won't heal HEENT: Negative for trouble hearing, trouble seeing, ringing in ears, mouth sores, hoarseness, change in voice, dysphagia. CV:  Negative for chest pain, dyspnea, edema, palpitations Resp: Negative for cough, dyspnea, hemoptysis GI: Negative for nausea, vomiting, diarrhea, constipation, abdominal pain, melena, hematochezia. GU: Negative for dysuria, incontinence, urinary hesitance, hematuria, vaginal or penile discharge, polyuria, sexual difficulty, lumps in testicle or breasts MSK: Positive for muscle cramps or aches, negative for joint pain or swelling Neuro: Negative for headaches, weakness, numbness, dizziness, passing out/fainting  Psych: Negative for depression, anxiety, memory problems  Objective  Physical Exam Vitals:   10/09/16 1439  BP: 130/84  Pulse: 90  Resp: 18  Temp: 98.8 F (37.1 C)    BP Readings from Last 3 Encounters:  10/09/16 130/84  07/16/16 (!) 142/80  07/10/16 138/82   Wt Readings from Last 3 Encounters:  10/09/16 189 lb 4 oz  (85.8 kg)  07/16/16 187 lb (84.8 kg)  07/10/16 187 lb (84.8 kg)    Physical Exam  Constitutional: No distress.  HENT:  Head: Normocephalic and atraumatic.  Mouth/Throat: Oropharynx is clear and moist. No oropharyngeal exudate.  Eyes: Conjunctivae are normal. Pupils are equal, round, and reactive to light.  Neck: Neck supple.  Cardiovascular: Normal rate, regular rhythm and normal heart sounds.   Pulmonary/Chest: Effort normal and breath sounds normal.  Abdominal: Soft. Bowel sounds are normal. She exhibits no distension. There is no tenderness. There is no rebound and no guarding.  Genitourinary:  Genitourinary Comments: Bilateral breasts with no masses or nipple inversion, small rough patch of skin in the upper inner quadrant and upper outer quadrant of the right breast, no skin changes of the left breast, no masses noted in bilateral axilla  Musculoskeletal: She exhibits no edema.  Mild discomfort in palpation over right anterior hip, no skin changes or underlying masses palpated  Lymphadenopathy:    She has no cervical adenopathy.  Neurological: She is alert. Gait normal.  Skin: Skin is warm and dry. She is not diaphoretic.  2 small rough erythematous patches on bilateral upper cheeks  Psychiatric: Mood and affect normal.     Assessment/Plan:   Encounter for general adult medical examination with abnormal findings Discussed diet and exercise. Patient will check with her insurance company on the cost of hepatitis C, tetanus vaccination, and DEXA scan. We will obtain a diagnostic mammogram given the skin changes though I do believe these are likely actinic keratoses. We will refer her for colonoscopy given the recommended screening interval is 3 years for tubular adenoma greater than or equal to 1 cm. She will await the new shingles vaccine. Lab work will be fasting in the future.  Myalgia Resolved with stopping Lipitor though recurred with pravastatin. We will have her discontinue  her pravastatin. If her muscle aches. After discontinuing this medication and she will wait a week and then try low-dose Crestor. If she continues to have muscle aches with statins could consider Zetia.  Actinic keratoses Skin lesions most consistent with actinic keratoses. We will refer to dermatology.   Orders Placed This Encounter  Procedures  . MM Digital Diagnostic Bilat    Standing Status:   Future    Standing Expiration Date:   12/07/2017    Order Specific Question:   Reason for Exam (SYMPTOM  OR DIAGNOSIS REQUIRED)    Answer:   right breast new skin lesions, due for screening mammogram    Order Specific Question:   Preferred imaging location?    Answer:   [facility]  . US BREAST COMPLETE UNI RIGHT INC AXILLA    Standing Status:   Future    Standing Expiration Date:   12/07/2017    Order Specific Question:   Reason for Exam (SYMPTOM  OR DIAGNOSIS REQUIRED)    Answer:   right breast new skin lesions, due for screening mammogram    Order Specific Question:   Preferred imaging location?    Answer:   [facility]  . Lipid Profile    Standing Status:  Future    Standing Expiration Date:   10/09/2017  . Comp Met (CMET)    Standing Status:   Future    Standing Expiration Date:   10/09/2017  . CBC    Standing Status:   Future    Standing Expiration Date:   10/09/2017  . TSH    Standing Status:   Future    Standing Expiration Date:   10/09/2017  . HgB A1c    Standing Status:   Future    Standing Expiration Date:   10/09/2017  . Ambulatory referral to Gastroenterology    Referral Priority:   Routine    Referral Type:   Consultation    Referral Reason:   Specialty Services Required    Number of Visits Requested:   1    Meds ordered this encounter  Medications  . rosuvastatin (CRESTOR) 5 MG tablet    Sig: Take 1 tablet (5 mg total) by mouth daily.    Dispense:  30 tablet    Refill:  [place], MD [place]

## 2016-10-09 NOTE — Patient Instructions (Addendum)
Nice to see you. We'll get you set up for mammogram and an appointment with a dermatologist. We will get you set up for colonoscopy. Please contact your insurance company to see if they will cover a DEXA scan and hepatitis C screening. We will have you discontinue your pravastatin. If your muscle aches do not improve with this please let us know. If they do improve you can try starting on the Crestor to see if this will not cause muscle aches.

## 2016-10-09 NOTE — Assessment & Plan Note (Signed)
Skin lesions most consistent with actinic keratoses. We will refer to dermatology.

## 2016-10-09 NOTE — Assessment & Plan Note (Signed)
Resolved with stopping Lipitor though recurred with pravastatin. We will have her discontinue her pravastatin. If her muscle aches. After discontinuing this medication and she will wait a week and then try low-dose Crestor. If she continues to have muscle aches with statins could consider Zetia.

## 2016-10-09 NOTE — Assessment & Plan Note (Signed)
Discussed diet and exercise. Patient will check with her insurance company on the cost of hepatitis C, tetanus vaccination, and DEXA scan. We will obtain a diagnostic mammogram given the skin changes though I do believe these are likely actinic keratoses. We will refer her for colonoscopy given the recommended screening interval is 3 years for tubular adenoma greater than or equal to 1 cm. She will await the new shingles vaccine. Lab work will be fasting in the future.

## 2016-10-09 NOTE — Progress Notes (Signed)
Pre visit review using our clinic review tool, if applicable. No additional management support is needed unless otherwise documented below in the visit note. 

## 2016-10-10 ENCOUNTER — Other Ambulatory Visit: Payer: Self-pay | Admitting: Family Medicine

## 2016-10-10 DIAGNOSIS — R234 Changes in skin texture: Secondary | ICD-10-CM

## 2016-10-19 ENCOUNTER — Other Ambulatory Visit: Payer: PPO

## 2016-10-23 ENCOUNTER — Ambulatory Visit
Admission: RE | Admit: 2016-10-23 | Discharge: 2016-10-23 | Disposition: A | Discharge status: Home / Self Care | Payer: PPO | Source: Ambulatory Visit | Attending: Family Medicine | Admitting: Family Medicine

## 2016-10-23 ENCOUNTER — Other Ambulatory Visit: Payer: Self-pay | Admitting: Family Medicine

## 2016-10-23 DIAGNOSIS — R921 Mammographic calcification found on diagnostic imaging of breast: Secondary | ICD-10-CM | POA: Diagnosis not present

## 2016-10-23 DIAGNOSIS — R234 Changes in skin texture: Secondary | ICD-10-CM

## 2016-10-23 DIAGNOSIS — R928 Other abnormal and inconclusive findings on diagnostic imaging of breast: Secondary | ICD-10-CM | POA: Diagnosis not present

## 2016-10-26 ENCOUNTER — Other Ambulatory Visit: Payer: Self-pay | Admitting: Family Medicine

## 2016-10-26 DIAGNOSIS — R928 Other abnormal and inconclusive findings on diagnostic imaging of breast: Secondary | ICD-10-CM

## 2016-10-29 ENCOUNTER — Encounter: Payer: Self-pay | Admitting: Family Medicine

## 2016-12-10 ENCOUNTER — Other Ambulatory Visit: Payer: Self-pay | Admitting: Family Medicine

## 2016-12-11 ENCOUNTER — Other Ambulatory Visit: Payer: Self-pay

## 2016-12-11 MED ORDER — LISINOPRIL 40 MG PO TABS: 40.0000 mg | ORAL_TABLET | Freq: Every day | ORAL | 3 refills | Status: DC

## 2016-12-11 NOTE — Telephone Encounter (Signed)
Sent to pharmacy. Patient was supposed to return for lab work previously. It appears she did not do this. See if she can come in for fasting labs. Thanks.

## 2016-12-11 NOTE — Telephone Encounter (Signed)
Last OV 10/09/16 last filled by Dr.Walker 10/06/15 90 [id number]

## 2016-12-12 NOTE — Telephone Encounter (Signed)
Left message to return call 

## 2016-12-12 NOTE — Telephone Encounter (Signed)
Patient is scheduled for labs this friday

## 2016-12-14 ENCOUNTER — Other Ambulatory Visit (INDEPENDENT_AMBULATORY_CARE_PROVIDER_SITE_OTHER): Payer: PPO

## 2016-12-14 DIAGNOSIS — Z130 Encounter for screening for diseases of the blood and blood-forming organs and certain disorders involving the immune mechanism: Secondary | ICD-10-CM

## 2016-12-14 DIAGNOSIS — E669 Obesity, unspecified: Secondary | ICD-10-CM | POA: Diagnosis not present

## 2016-12-14 DIAGNOSIS — Z13220 Encounter for screening for lipoid disorders: Secondary | ICD-10-CM | POA: Diagnosis not present

## 2016-12-14 DIAGNOSIS — Z1329 Encounter for screening for other suspected endocrine disorder: Secondary | ICD-10-CM

## 2016-12-14 LAB — COMPREHENSIVE METABOLIC PANEL
ALBUMIN: 4.2 g/dL (ref 3.5–5.2)
ALT: 13 U/L (ref 0–35)
AST: 14 U/L (ref 0–37)
Alkaline Phosphatase: 89 U/L (ref 39–117)
BUN: 25 mg/dL — AB (ref 6–23)
CHLORIDE: 101 meq/L (ref 96–112)
CO2: 30 meq/L (ref 19–32)
CREATININE: 0.79 mg/dL (ref 0.40–1.20)
Calcium: 9.6 mg/dL (ref 8.4–10.5)
GFR: 76.16 mL/min (ref >60.00)
GLUCOSE: 105 mg/dL — AB (ref 70–99)
POTASSIUM: 4.8 meq/L (ref 3.5–5.1)
Sodium: 136 mEq/L (ref 135–145)
Total Bilirubin: 0.4 mg/dL (ref 0.2–1.2)
Total Protein: 6.6 g/dL (ref 6.0–8.3)

## 2016-12-14 LAB — LIPID PANEL
CHOL/HDL RATIO: 2
Cholesterol: 145 mg/dL (ref 0–200)
HDL: 62.7 mg/dL (ref >39.00)
LDL CALC: 66 mg/dL (ref 0–99)
NONHDL: 82.68
Triglycerides: 81 mg/dL (ref 0.0–149.0)
VLDL: 16.2 mg/dL (ref 0.0–40.0)

## 2016-12-14 LAB — CBC
HEMATOCRIT: 43.1 % (ref 36.0–46.0)
HEMOGLOBIN: 13.8 g/dL (ref 12.0–15.0)
MCHC: 31.9 g/dL (ref 30.0–36.0)
MCV: 88 fl (ref 78.0–100.0)
Platelets: 255 10*3/uL (ref 150.0–400.0)
RBC: 4.9 Mil/uL (ref 3.87–5.11)
RDW: 13.8 % (ref 11.5–15.5)
WBC: 7.9 10*3/uL (ref 4.0–10.5)

## 2016-12-14 LAB — TSH: TSH: 1.36 u[IU]/mL (ref 0.35–4.50)

## 2016-12-14 LAB — HEMOGLOBIN A1C: Hgb A1c MFr Bld: 6.2 % (ref 4.6–6.5)

## 2016-12-16 ENCOUNTER — Encounter: Payer: Self-pay | Admitting: Family Medicine

## 2016-12-16 DIAGNOSIS — R7303 Prediabetes: Secondary | ICD-10-CM | POA: Insufficient documentation

## 2017-01-09 ENCOUNTER — Encounter: Payer: Self-pay | Admitting: Family Medicine

## 2017-01-09 ENCOUNTER — Ambulatory Visit (INDEPENDENT_AMBULATORY_CARE_PROVIDER_SITE_OTHER): Payer: PPO | Admitting: Family Medicine

## 2017-01-09 VITALS — BP 142/82 | HR 76 | Temp 98.5°F | Wt 189.6 lb

## 2017-01-09 DIAGNOSIS — L989 Disorder of the skin and subcutaneous tissue, unspecified: Secondary | ICD-10-CM | POA: Diagnosis not present

## 2017-01-09 DIAGNOSIS — I10 Essential (primary) hypertension: Secondary | ICD-10-CM

## 2017-01-09 DIAGNOSIS — J449 Chronic obstructive pulmonary disease, unspecified: Secondary | ICD-10-CM

## 2017-01-09 DIAGNOSIS — E669 Obesity, unspecified: Secondary | ICD-10-CM | POA: Diagnosis not present

## 2017-01-09 DIAGNOSIS — M791 Myalgia, unspecified site: Secondary | ICD-10-CM

## 2017-01-09 DIAGNOSIS — E785 Hyperlipidemia, unspecified: Secondary | ICD-10-CM

## 2017-01-09 DIAGNOSIS — R0609 Other forms of dyspnea: Secondary | ICD-10-CM

## 2017-01-09 DIAGNOSIS — R600 Localized edema: Secondary | ICD-10-CM | POA: Insufficient documentation

## 2017-01-09 LAB — BRAIN NATRIURETIC PEPTIDE: PRO B NATRI PEPTIDE: 40 pg/mL (ref 0.0–100.0)

## 2017-01-09 LAB — COMPREHENSIVE METABOLIC PANEL
ALT: 12 U/L (ref 0–35)
AST: 13 U/L (ref 0–37)
Albumin: 4.4 g/dL (ref 3.5–5.2)
Alkaline Phosphatase: 82 U/L (ref 39–117)
BILIRUBIN TOTAL: 0.3 mg/dL (ref 0.2–1.2)
BUN: 23 mg/dL (ref 6–23)
CALCIUM: 9.6 mg/dL (ref 8.4–10.5)
CHLORIDE: 100 meq/L (ref 96–112)
CO2: 30 meq/L (ref 19–32)
CREATININE: 0.76 mg/dL (ref 0.40–1.20)
GFR: 79.62 mL/min (ref >60.00)
GLUCOSE: 84 mg/dL (ref 70–99)
Potassium: 5.2 mEq/L — ABNORMAL HIGH (ref 3.5–5.1)
SODIUM: 137 meq/L (ref 135–145)
Total Protein: 7 g/dL (ref 6.0–8.3)

## 2017-01-09 LAB — TSH: TSH: 2.06 u[IU]/mL (ref 0.35–4.50)

## 2017-01-09 LAB — CBC
HEMATOCRIT: 42.8 % (ref 36.0–46.0)
Hemoglobin: 13.8 g/dL (ref 12.0–15.0)
MCHC: 32.3 g/dL (ref 30.0–36.0)
MCV: 85.8 fl (ref 78.0–100.0)
Platelets: 254 10*3/uL (ref 150.0–400.0)
RBC: 4.99 Mil/uL (ref 3.87–5.11)
RDW: 14.2 % (ref 11.5–15.5)
WBC: 8.2 10*3/uL (ref 4.0–10.5)

## 2017-01-09 NOTE — Patient Instructions (Addendum)
Nice to see you. We'll get you to see dermatology and cardiology. We will obtain lab work and contact you with the results. If you develop worsening breathing issues, or develop chest pain please seek medical attention immediately.

## 2017-01-09 NOTE — Progress Notes (Signed)
Pre visit review using our clinic review tool, if applicable. No additional management support is needed unless otherwise documented below in the visit note. 

## 2017-01-09 NOTE — Progress Notes (Signed)
[name], MD Phone: [phone]  Tammy Choi is a 71 y.o. female who presents today for follow-up.  Hypertension: She is taking lisinopril. No chest pain. Does note some exertional shortness of breath when she moves around. She's noted onset of lower extremity edema right slightly greater than left over the last 6 months. Does go down at night. Builds up throughout the day. No orthopnea or PND. Echo about a year ago that did not reveal any heart failure. She did have a CT scan last year that did reveal some coronary artery calcifications. No dyspnea currently. He does not check her blood pressure.  She's been able to tolerate pravastatin more easily than Lipitor or Crestor. No myalgias with pravastatin.  COPD: Currently taking Symbicort. Albuterol once a week. Has had some shortness of breath as outlined above. Not much cough. Not as much wheezing. Cough is nonproductive.  Obesity: Notes she is not exercising very much. She is not eating very well as she eats whenever her grandchildren at.  PMH: Former smoker   ROS see history of present illness  Objective  Physical Exam Vitals:   01/09/17 1424  BP: (!) 142/82  Pulse: 76  Temp: 98.5 F (36.9 C)    BP Readings from Last 3 Encounters:  01/09/17 (!) 142/82  10/09/16 130/84  07/16/16 (!) 142/80   Wt Readings from Last 3 Encounters:  01/09/17 189 lb 9.6 oz (86 kg)  10/09/16 189 lb 4 oz (85.8 kg)  07/16/16 187 lb (84.8 kg)    Physical Exam  Constitutional: No distress.  Cardiovascular: Normal rate, regular rhythm and normal heart sounds.   Pulmonary/Chest: Effort normal and breath sounds normal.  Musculoskeletal: She exhibits edema (trace pedal).  Neurological: She is alert. Gait normal.  Skin: Skin is warm and dry. She is not diaphoretic.   EKG: Normal sinus rhythm with occasional ectopic ventricular beat, rate 74, incomplete right bundle branch block, artifact noted in several leads  Assessment/Plan:  Please see individual problem list.  Hyperlipidemia Tolerating pravastatin we'll continue this.  Myalgia Improved with going back on pravastatin continue this.  COPD (chronic obstructive pulmonary disease) ([facility]) Seems to be overall doing well. Suspect her dyspnea on exertion is likely related to her severe COPD. She reports her shortness of breath has not changed recently. She'll continue her inhalers as previously prescribed and continue to monitor.  Bilateral lower extremity edema New onset over the last 6 months. Echo a year ago reassuring. This edema in combination with her exertional dyspnea could be concerning for a cardiac cause though the dyspnea may be related to her COPD. She did have coronary calcifications seen on CT scan. We will check lab work as outlined below. EKG was checked as well. BNP to evaluate for fluid accumulation. I do suspect her dyspnea is likely related to her COPD though given the constellation of symptoms we'll have her evaluated by cardiology as well. She is given return precautions.  Obesity (BMI 30-39.9) Discussed dietary changes. Try to stay active.  Hypertension Slightly above goal. She does not check at home. We will see what the lab work shows and then likely start her on amlodipine.  She reports she never received a call regarding dermatology referral for her slow to heal skin lesions. I'll place this referral today.  Orders Placed This Encounter  Procedures  . B Nat Peptide  . Comp Met (CMET)  . CBC  . TSH  . Ambulatory referral to Dermatology    Referral Priority:  Routine    Referral Type:   Consultation    Referral Reason:   Specialty Services Required    Requested Specialty:   Dermatology    Number of Visits Requested:   1  . Ambulatory referral to Cardiology    Referral Priority:   Routine    Referral Type:   Consultation    Referral Reason:   Specialty Services Required    Requested Specialty:   Cardiology    Number of Visits  Requested:   1  . EKG 12-Lead     [name], MD [place]

## 2017-01-09 NOTE — Assessment & Plan Note (Signed)
Seems to be overall doing well. Suspect her dyspnea on exertion is likely related to her severe COPD. She reports her shortness of breath has not changed recently. She'll continue her inhalers as previously prescribed and continue to monitor.

## 2017-01-09 NOTE — Assessment & Plan Note (Signed)
Improved with going back on pravastatin continue this.

## 2017-01-09 NOTE — Assessment & Plan Note (Signed)
New onset over the last 6 months. Echo a year ago reassuring. This edema in combination with her exertional dyspnea could be concerning for a cardiac cause though the dyspnea may be related to her COPD. She did have coronary calcifications seen on CT scan. We will check lab work as outlined below. EKG was checked as well. BNP to evaluate for fluid accumulation. I do suspect her dyspnea is likely related to her COPD though given the constellation of symptoms we'll have her evaluated by cardiology as well. She is given return precautions.

## 2017-01-09 NOTE — Assessment & Plan Note (Signed)
Slightly above goal. She does not check at home. We will see what the lab work shows and then likely start her on amlodipine.

## 2017-01-09 NOTE — Assessment & Plan Note (Signed)
Tolerating pravastatin we'll continue this.

## 2017-01-09 NOTE — Assessment & Plan Note (Signed)
Discussed dietary changes. Try to stay active.

## 2017-01-14 NOTE — Progress Notes (Signed)
Mailed letter to patient to call the office

## 2017-01-17 ENCOUNTER — Other Ambulatory Visit: Payer: Self-pay | Admitting: Family Medicine

## 2017-01-17 MED ORDER — AMLODIPINE BESYLATE 5 MG PO TABS: 5.0000 mg | ORAL_TABLET | Freq: Every day | ORAL | 3 refills | Status: DC

## 2017-01-18 ENCOUNTER — Telehealth: Payer: Self-pay | Admitting: Radiology

## 2017-01-18 NOTE — Telephone Encounter (Signed)
Pt coming in for labs on Monday, please place future labs. Thank you.

## 2017-01-21 ENCOUNTER — Other Ambulatory Visit: Payer: PPO

## 2017-01-21 ENCOUNTER — Telehealth: Payer: Self-pay | Admitting: Radiology

## 2017-01-21 DIAGNOSIS — E875 Hyperkalemia: Secondary | ICD-10-CM

## 2017-01-21 NOTE — Addendum Note (Signed)
Addended by: Leone Haven on: 01/21/2017 08:29 AM   Modules accepted: Orders

## 2017-01-21 NOTE — Telephone Encounter (Signed)
Ordered

## 2017-01-21 NOTE — Telephone Encounter (Signed)
Pt coming in today for labs, please place future orders. Thank you.  

## 2017-01-25 ENCOUNTER — Other Ambulatory Visit: Payer: PPO

## 2017-01-28 ENCOUNTER — Other Ambulatory Visit (INDEPENDENT_AMBULATORY_CARE_PROVIDER_SITE_OTHER): Payer: PPO

## 2017-01-28 ENCOUNTER — Telehealth: Payer: Self-pay

## 2017-01-28 DIAGNOSIS — E875 Hyperkalemia: Secondary | ICD-10-CM | POA: Diagnosis not present

## 2017-01-28 IMAGING — CR DG CHEST 2V
1 series · 2 of 2 positions shown · non-contrast
Comparison: Chest CT 02/09/2016, chest radiographs 02/03/2016, and
earlier.

CLINICAL DATA: 69-year-old female with COPD and persistent opacity
in the lingula since [place].

CT appearance of the lingula [REDACTED] suggested scarring/ atelectasis,
possibly residual pneumonia. CT suggested residual pneumonia in the
left lower lobe at that time.
Subsequent encounter.
EXAM:
CHEST  2 VIEW

[Series 1: dg chest 2 view · 0.14mm/px · 2 of 2 slices shown]
[im 1/2]
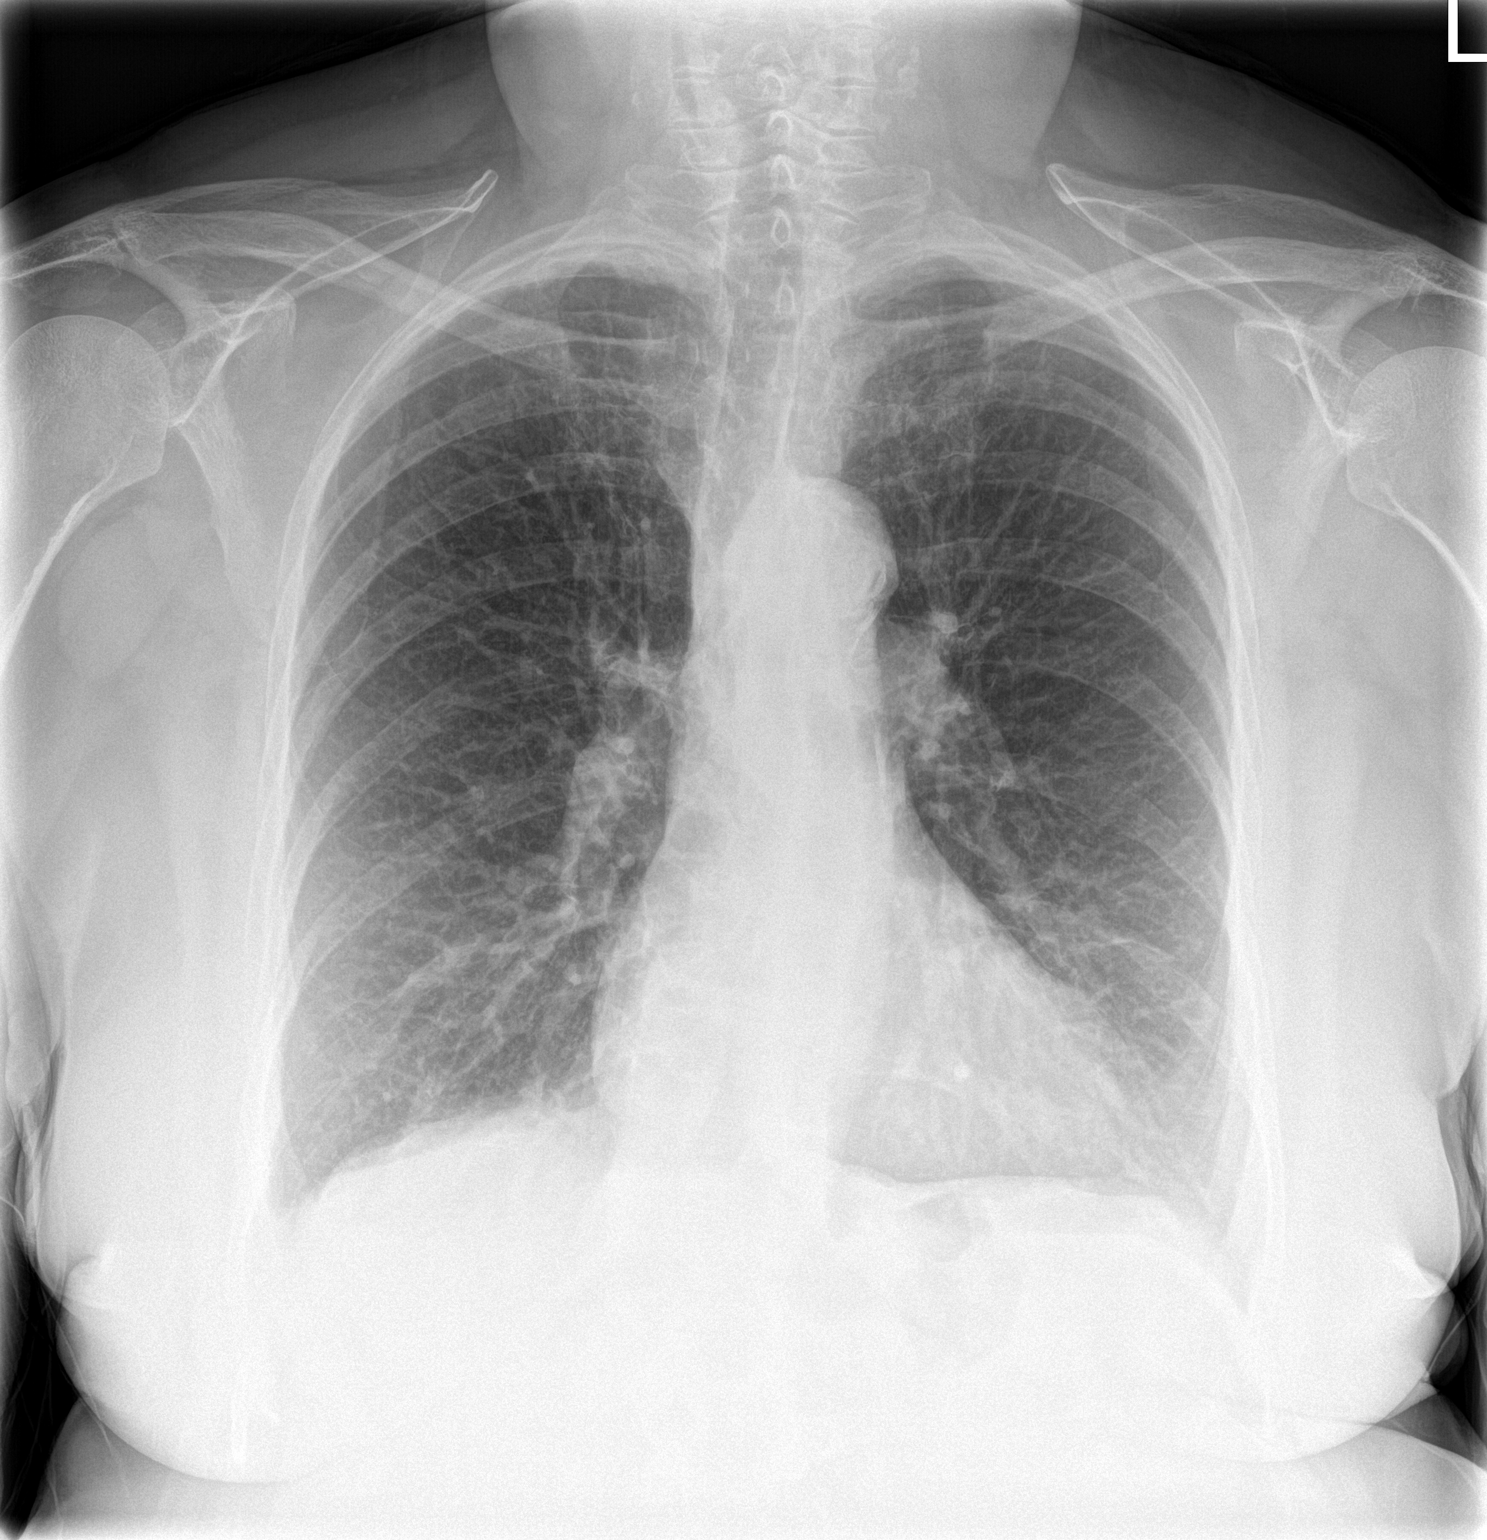
[im 2/2]
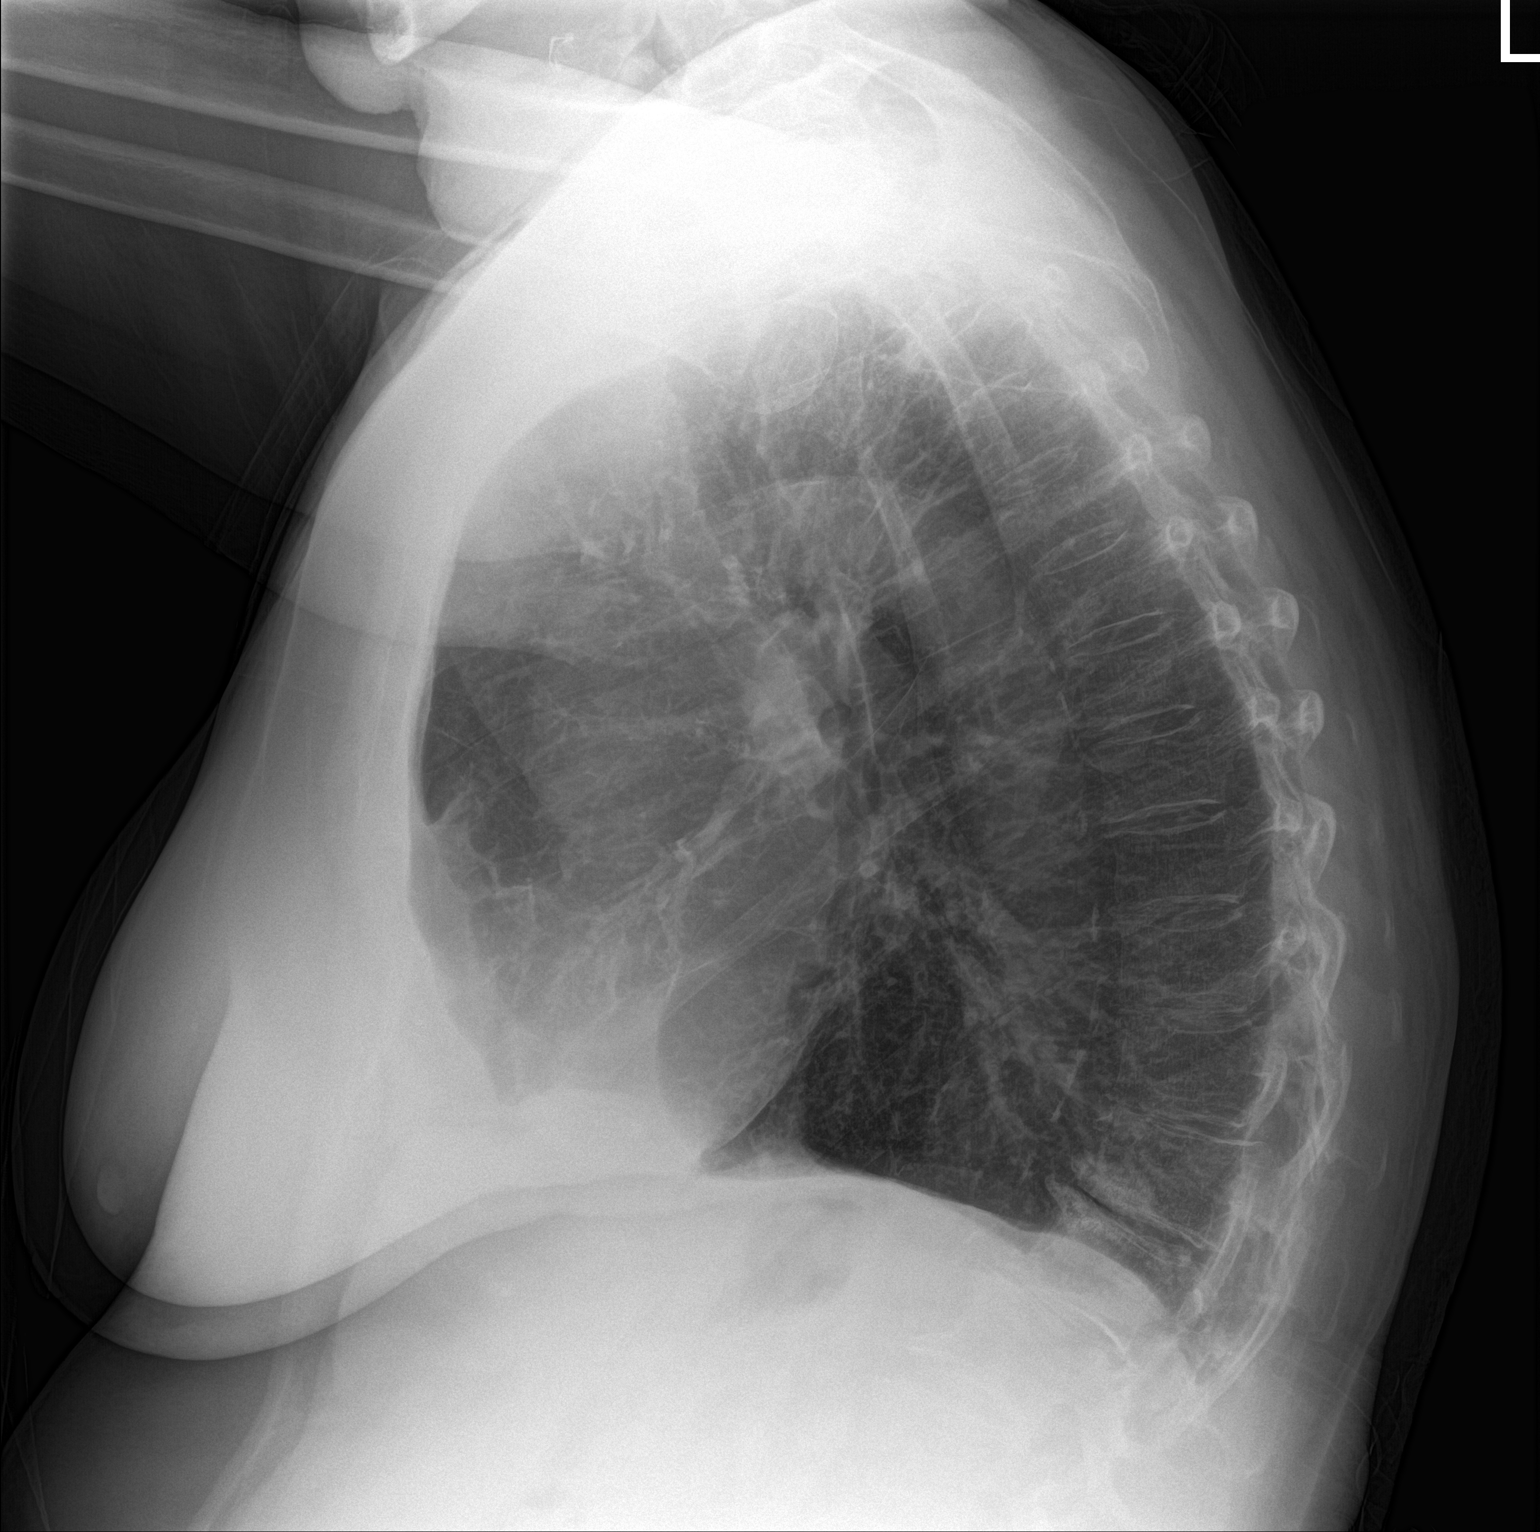

[2 of 2 positions shown; findings below may reference images not displayed]

FINDINGS: The right middle lobe nodule measuring 11 mm seen recently by CT has
not been apparent radiographically. Lung volumes are stable. No
pneumothorax or pulmonary edema. Streaky and nodular opacity in the
lingula shows mild further regression [REDACTED], appearing near
baseline now. No areas of worsening ventilation. No pleural
effusion. Stable cardiac size and mediastinal contours. Visualized
tracheal air column is within normal limits. Calcified aortic
atherosclerosis. Stable visualized osseous structures.
IMPRESSION: 1. Right middle lobe nodule described by CT on 02/09/2016 remains
radiographically occult, and will require follow-up CT versus PET-CT
versus tissue sampling as advised in that report.
2. Further regressed streaky opacity in the lingula [REDACTED], with
radiographic appearance of chronic lung disease now at baseline. No
new cardiopulmonary abnormality.
3. Calcified aortic atherosclerosis.

## 2017-01-28 NOTE — Telephone Encounter (Signed)
Patient notified and will only take if needed

## 2017-01-28 NOTE — Telephone Encounter (Signed)
I do not think this will be a problem to take both of these at the same time. The ibuprofen could potentially be causing her BP to be elevated to some degree.

## 2017-01-28 NOTE — Telephone Encounter (Signed)
Patient states she will contact cardiology and set up appointment. Patient states she was put on amlodipine last time she was in the office and the insert states not to take ibuprofen, is she able to take ibuprofen with it? She takes it for back pain

## 2017-01-29 LAB — POTASSIUM: Potassium: 5 mEq/L (ref 3.5–5.1)

## 2017-04-04 ENCOUNTER — Other Ambulatory Visit: Payer: Self-pay | Admitting: Family Medicine

## 2017-04-10 ENCOUNTER — Ambulatory Visit (INDEPENDENT_AMBULATORY_CARE_PROVIDER_SITE_OTHER): Payer: PPO | Admitting: Family Medicine

## 2017-04-10 ENCOUNTER — Encounter: Payer: Self-pay | Admitting: Family Medicine

## 2017-04-10 DIAGNOSIS — M791 Myalgia, unspecified site: Secondary | ICD-10-CM

## 2017-04-10 DIAGNOSIS — J449 Chronic obstructive pulmonary disease, unspecified: Secondary | ICD-10-CM

## 2017-04-10 DIAGNOSIS — I10 Essential (primary) hypertension: Secondary | ICD-10-CM

## 2017-04-10 DIAGNOSIS — R600 Localized edema: Secondary | ICD-10-CM | POA: Diagnosis not present

## 2017-04-10 DIAGNOSIS — F324 Major depressive disorder, single episode, in partial remission: Secondary | ICD-10-CM

## 2017-04-10 NOTE — Assessment & Plan Note (Signed)
Improved with coming off of pravastatin. Return with going back on. We'll try Crestor. If she has myalgias with this she'll let us know.

## 2017-04-10 NOTE — Assessment & Plan Note (Signed)
- 

## 2017-04-10 NOTE — Assessment & Plan Note (Signed)
Stable.  Continue current medications.

## 2017-04-10 NOTE — Progress Notes (Signed)
  [name], MD Phone: [phone]  Tammy Choi is a 71 y.o. female who presents today for follow-up.  HYPERTENSION  Disease Monitoring  Home BP Monitoring not checking Chest pain- no    Dyspnea- stable or slightly improved Medications  Compliance-  taking amlodipine, lisinopril.  Edema- quite a bit improved as well  Depression: Sometimes does feel depressed though is only mild. No depression now. No anxiety. No SI or HI. On Prozac.  Does note the myalgias she had previously improved when coming off of the pravastatin. She is now back on it and still has some myalgias. She is interested in trying an alternative medication.  She does note her breathing has improved a little bit. She has not been evaluated yet by cardiology based on her own decision. She is going to contact them next week to get set up with this. She's had no change to chronic cough or wheezing. Rarely gets these symptoms.   PMH: Former smoker   ROS see history of present illness  Objective  Physical Exam Vitals:   04/10/17 1539  BP: 138/80  Pulse: 90  Temp: 99.1 F (37.3 C)    BP Readings from Last 3 Encounters:  04/10/17 138/80  01/09/17 (!) 142/82  10/09/16 130/84   Wt Readings from Last 3 Encounters:  04/10/17 189 lb 12.8 oz (86.1 kg)  01/09/17 189 lb 9.6 oz (86 kg)  10/09/16 189 lb 4 oz (85.8 kg)    Physical Exam  Constitutional: No distress.  Cardiovascular: Normal rate, regular rhythm and normal heart sounds.   Pulmonary/Chest: Effort normal and breath sounds normal.  Musculoskeletal: She exhibits no edema.  Neurological: She is alert. Gait normal.  Skin: Skin is warm and dry. She is not diaphoretic.     Assessment/Plan: Please see individual problem list.  Hypertension At goal for age. She'll continue her current medications.  Depression Stable. Continue Prozac.  Bilateral lower extremity edema Has improved. No evidence of edema today. Did discuss that she should  still see cardiology given her prior symptoms. Suspect her dyspnea is related to her COPD. She'll continue treatment for that. If she develops worsening dyspnea or other symptoms she will be reevaluated.  Myalgia Improved with coming off of pravastatin. Return with going back on. We'll try Crestor. If she has myalgias with this she'll let us know.  COPD (chronic obstructive pulmonary disease) (HCC) Stable. Continue current medications.  [name], MD [place]

## 2017-04-10 NOTE — Assessment & Plan Note (Signed)
Has improved. No evidence of edema today. Did discuss that she should still see cardiology given her prior symptoms. Suspect her dyspnea is related to her COPD. She'll continue treatment for that. If she develops worsening dyspnea or other symptoms she will be reevaluated.

## 2017-04-10 NOTE — Assessment & Plan Note (Signed)
At goal for age. She'll continue her current medications.

## 2017-04-29 ENCOUNTER — Ambulatory Visit
Admission: RE | Admit: 2017-04-29 | Discharge: 2017-04-29 | Disposition: A | Discharge status: Home / Self Care | Payer: PPO | Source: Ambulatory Visit | Attending: Family Medicine | Admitting: Family Medicine

## 2017-04-29 ENCOUNTER — Other Ambulatory Visit: Payer: Self-pay | Admitting: Family Medicine

## 2017-04-29 DIAGNOSIS — R928 Other abnormal and inconclusive findings on diagnostic imaging of breast: Secondary | ICD-10-CM

## 2017-04-30 ENCOUNTER — Encounter: Payer: Self-pay | Admitting: *Deleted

## 2017-07-16 ENCOUNTER — Other Ambulatory Visit: Payer: Self-pay | Admitting: Family Medicine

## 2017-07-22 ENCOUNTER — Ambulatory Visit: Payer: PPO | Admitting: Family Medicine

## 2017-07-22 DIAGNOSIS — Z0289 Encounter for other administrative examinations: Secondary | ICD-10-CM

## 2017-10-01 HISTORY — PX: BREAST LUMPECTOMY: SHX2

## 2017-10-31 ENCOUNTER — Other Ambulatory Visit: Payer: Self-pay | Admitting: Internal Medicine

## 2017-12-31 ENCOUNTER — Telehealth: Payer: Self-pay | Admitting: Pulmonary Disease

## 2017-12-31 ENCOUNTER — Encounter: Payer: Self-pay | Admitting: Pulmonary Disease

## 2017-12-31 NOTE — Telephone Encounter (Signed)
3 attempts to schedule fu appt from recall list.  Mailed Letter. Deleting recall.  ° °

## 2018-02-06 ENCOUNTER — Other Ambulatory Visit: Payer: Self-pay | Admitting: Family Medicine

## 2018-03-06 ENCOUNTER — Other Ambulatory Visit: Payer: Self-pay | Admitting: Family Medicine

## 2018-03-06 ENCOUNTER — Other Ambulatory Visit: Payer: Self-pay | Admitting: Internal Medicine

## 2018-03-14 ENCOUNTER — Ambulatory Visit (INDEPENDENT_AMBULATORY_CARE_PROVIDER_SITE_OTHER): Payer: PPO | Admitting: Family Medicine

## 2018-03-14 ENCOUNTER — Encounter: Payer: Self-pay | Admitting: Family Medicine

## 2018-03-14 VITALS — BP 112/62 | HR 92 | Temp 98.2°F | Ht 62.0 in | Wt 187.8 lb

## 2018-03-14 DIAGNOSIS — J449 Chronic obstructive pulmonary disease, unspecified: Secondary | ICD-10-CM | POA: Diagnosis not present

## 2018-03-14 DIAGNOSIS — I10 Essential (primary) hypertension: Secondary | ICD-10-CM | POA: Diagnosis not present

## 2018-03-14 DIAGNOSIS — R0609 Other forms of dyspnea: Secondary | ICD-10-CM

## 2018-03-14 DIAGNOSIS — E785 Hyperlipidemia, unspecified: Secondary | ICD-10-CM

## 2018-03-14 DIAGNOSIS — R600 Localized edema: Secondary | ICD-10-CM | POA: Diagnosis not present

## 2018-03-14 DIAGNOSIS — R928 Other abnormal and inconclusive findings on diagnostic imaging of breast: Secondary | ICD-10-CM

## 2018-03-14 MED ORDER — BUDESONIDE-FORMOTEROL FUMARATE 160-4.5 MCG/ACT IN AERO: 2.0000 | INHALATION_SPRAY | Freq: Two times a day (BID) | RESPIRATORY_TRACT | 5 refills | Status: DC

## 2018-03-14 MED ORDER — ALBUTEROL SULFATE HFA 108 (90 BASE) MCG/ACT IN AERS: 2.0000 | INHALATION_SPRAY | Freq: Four times a day (QID) | RESPIRATORY_TRACT | 2 refills | Status: DC | PRN

## 2018-03-14 NOTE — Assessment & Plan Note (Signed)
Check lipid panel  

## 2018-03-14 NOTE — Assessment & Plan Note (Addendum)
Well-controlled.  Continue current regimen.  Orthostatics with elevated blood pressure though well-controlled on recheck and initially.  Possibly orthostatic BPs not accurate.

## 2018-03-14 NOTE — Assessment & Plan Note (Signed)
Chronic issue.  Slightly worsened recently.  No heart failure symptoms.  Potentially could be related to amlodipine.  We will check lab work to evaluate for cause.  Check a BMP.  EKG completed.  May need to consider discontinuing amlodipine.

## 2018-03-14 NOTE — Patient Instructions (Signed)
Nice to see you. We will get lab work today and contact you with the results. We will get your mammogram scheduled. If you develop chest pain, worsening shortness of breath, or any new or changing symptoms please be evaluated immediately.

## 2018-03-14 NOTE — Progress Notes (Signed)
[name], MD Phone: [phone]  NALA KACHEL is a 72 y.o. female who presents today for f/u.  CC: htn, edema, lightheaded  HYPERTENSION  Disease Monitoring  Home BP Monitoring well controlled Chest pain- yes, see below  Dyspnea- yes, see below Medications  Compliance-  Taking amlodipine, lisinopril. Lightheadedness-  Yes, see below  Edema- yes, see below  Patient notes chronic edema bilateral feet and ankles.  This has been going on for at least a year though is worsened over the last several weeks.  She wonders if she has fluid on her.  Left foot swells a little bit greater than the right foot.  Goes down overnight.  No orthopnea or PND.  She has chronic dyspnea on exertion.  She had done a lot better after quitting smoking though more recently gets a little worse.  She has been out of her albuterol and Symbicort.  She notes a little wheezing over the last 3 to 4 days.  She notes no fevers.  She has no trouble at rest.  She notes occasional pain radiating down from her right hip to her thigh.  She reported feeling a little lightheaded yesterday when up walking around.  She went and laid down and that resolved quickly.  No loss of consciousness.  No vertigo.  She had an abnormal mammogram and needed repeat diagnostic mammogram though never had that completed.    Social History   Tobacco Use  Smoking Status Former Smoker  . Packs/day: 0.25  . Last attempt to quit: 12/20/2015  . Years since quitting: 2.2  Smokeless Tobacco Never Used     ROS see history of present illness  Objective  Physical Exam Vitals:   03/14/18 1416  BP: 112/62  Pulse: 92  Temp: 98.2 F (36.8 C)  SpO2: 94%   Laying blood pressure 180/96 pulse 97 Sitting blood pressure 176/88 pulse 95 Standing blood pressure 176/98 pulse 98  BP Readings from Last 3 Encounters:  03/14/18 112/62  04/10/17 138/80  01/09/17 (!) 142/82   Wt Readings from Last 3 Encounters:  03/14/18 187 lb 12.8 oz  (85.2 kg)  04/10/17 189 lb 12.8 oz (86.1 kg)  01/09/17 189 lb 9.6 oz (86 kg)    Physical Exam  Constitutional: No distress.  Cardiovascular: Normal rate, regular rhythm and normal heart sounds.  Pulmonary/Chest: Effort normal and breath sounds normal.  Musculoskeletal: She exhibits edema (1+ pitting edema in bilateral feet and ankles right slightly greater than left).  Left calf 36 cm, right calf 35.5 cm  Neurological: She is alert.  Skin: Skin is warm and dry. She is not diaphoretic.   EKG: Sinus rhythm, rate 88, right-sided conduction defect and right axis, possible right ventricular hypertrophy or posterior fascicular block, no ischemic changes, unchanged from prior  Assessment/Plan: Please see individual problem list.  Hypertension Well-controlled.  Continue current regimen.  COPD (chronic obstructive pulmonary disease) ([place]) I suspect some of her breathing issues are likely related to her COPD particularly given that she has not been on her inhalers recently.  We will refill those and get her restarted on those.  Check lab work and EKG.  Given return precautions.  Hyperlipidemia Check lipid panel.  Bilateral lower extremity edema Chronic issue.  Slightly worsened recently.  No heart failure symptoms.  Potentially could be related to amlodipine.  We will check lab work to evaluate for cause.  Check a BMP.  EKG completed.  May need to consider discontinuing amlodipine.  Dyspnea on exertion Patient  with chronic dyspnea on exertion slightly worse recently.  I suspect this is likely related to her COPD being untreated currently.  She will restart her inhalers.  EKG was performed.  Lab work will be ordered to determine if there are any other contributing factors.  She is given return precautions.   Orders Placed This Encounter  Procedures  . MM DIAG BREAST TOMO BILATERAL    Standing Status:   Future    Standing Expiration Date:   05/15/2019    Order Specific Question:   Reason  for Exam (SYMPTOM  OR DIAGNOSIS REQUIRED)    Answer:   prior abnormal mammogram, follow-up imaging    Order Specific Question:   Preferred imaging location?    Answer:   [facility]  . CBC  . Comp Met (CMET)  . TSH  . HgB A1c  . Lipid panel  . EKG 12-Lead    Meds ordered this encounter  Medications  . albuterol (PROAIR HFA) 108 (90 Base) MCG/ACT inhaler    Sig: Inhale 2 puffs into the lungs every 6 (six) hours as needed for wheezing or shortness of breath.    Dispense:  1 Inhaler    Refill:  2  . budesonide-formoterol (SYMBICORT) 160-4.5 MCG/ACT inhaler    Sig: Inhale 2 puffs into the lungs 2 (two) times daily.    Dispense:  1 Inhaler    Refill:  [place], MD [place]

## 2018-03-14 NOTE — Assessment & Plan Note (Addendum)
Patient with chronic dyspnea on exertion slightly worse recently.  I suspect this is likely related to her COPD being untreated currently.  She will restart her inhalers.  EKG was performed.  Lab work will be ordered to determine if there are any other contributing factors.  She is given return precautions.

## 2018-03-14 NOTE — Assessment & Plan Note (Signed)
I suspect some of her breathing issues are likely related to her COPD particularly given that she has not been on her inhalers recently.  We will refill those and get her restarted on those.  Check lab work and EKG.  Given return precautions.

## 2018-03-15 LAB — CBC
HEMATOCRIT: 40.9 % (ref 35.0–45.0)
Hemoglobin: 13.5 g/dL (ref 11.7–15.5)
MCH: 27.7 pg (ref 27.0–33.0)
MCHC: 33 g/dL (ref 32.0–36.0)
MCV: 83.8 fL (ref 80.0–100.0)
MPV: 10.8 fL (ref 7.5–12.5)
Platelets: 265 10*3/uL (ref 140–400)
RBC: 4.88 10*6/uL (ref 3.80–5.10)
RDW: 13.3 % (ref 11.0–15.0)
WBC: 8.5 10*3/uL (ref 3.8–10.8)

## 2018-03-15 LAB — HEMOGLOBIN A1C
HEMOGLOBIN A1C: 6 %{Hb} — AB (ref <5.7)
Mean Plasma Glucose: 126 (calc)
eAG (mmol/L): 7 (calc)

## 2018-03-15 LAB — COMPREHENSIVE METABOLIC PANEL
AG Ratio: 1.7 (calc) (ref 1.0–2.5)
ALBUMIN MSPROF: 4.4 g/dL (ref 3.6–5.1)
ALKALINE PHOSPHATASE (APISO): 83 U/L (ref 33–130)
ALT: 13 U/L (ref 6–29)
AST: 16 U/L (ref 10–35)
BILIRUBIN TOTAL: 0.4 mg/dL (ref 0.2–1.2)
BUN: 22 mg/dL (ref 7–25)
CALCIUM: 10 mg/dL (ref 8.6–10.4)
CHLORIDE: 102 mmol/L (ref 98–110)
CO2: 24 mmol/L (ref 20–32)
Creat: 0.89 mg/dL (ref 0.60–0.93)
GLOBULIN: 2.6 g/dL (ref 1.9–3.7)
Glucose, Bld: 91 mg/dL (ref 65–99)
POTASSIUM: 4.9 mmol/L (ref 3.5–5.3)
Sodium: 140 mmol/L (ref 135–146)
Total Protein: 7 g/dL (ref 6.1–8.1)

## 2018-03-15 LAB — LIPID PANEL
Cholesterol: 155 mg/dL (ref <200)
HDL: 66 mg/dL (ref >50)
LDL CHOLESTEROL (CALC): 70 mg/dL
NON-HDL CHOLESTEROL (CALC): 89 mg/dL (ref <130)
Total CHOL/HDL Ratio: 2.3 (calc) (ref <5.0)
Triglycerides: 102 mg/dL (ref <150)

## 2018-03-15 LAB — TSH: TSH: 1.49 m[IU]/L (ref 0.40–4.50)

## 2018-03-15 LAB — BRAIN NATRIURETIC PEPTIDE: Brain Natriuretic Peptide: 48 pg/mL (ref <100)

## 2018-04-02 ENCOUNTER — Ambulatory Visit (INDEPENDENT_AMBULATORY_CARE_PROVIDER_SITE_OTHER): Payer: PPO

## 2018-04-02 DIAGNOSIS — I10 Essential (primary) hypertension: Secondary | ICD-10-CM

## 2018-04-02 NOTE — Progress Notes (Signed)
Patient comes in for 2 week blood pressure check.  Taking lisinopril 40 mg daily.  Checked blood pressure left arm large cuff  124/78 pulse 86 O2 93% right arm 126/80. Patient denies shortness of breath , chest pain.  She does report  some bilateral ankle swelling right worse than left. She states swelling has been present for awhile not new onset.

## 2018-04-02 NOTE — Progress Notes (Signed)
I observed the patient's ankles while she was in the office.  She notes they are significantly improved from previously likely related to coming off of her amlodipine.  She will continue with her current regimen.  Blood pressure is well controlled.

## 2018-04-04 NOTE — Progress Notes (Signed)
Patient advised to continue current medication regimen.

## 2018-05-01 ENCOUNTER — Other Ambulatory Visit: Payer: Self-pay | Admitting: Family Medicine

## 2018-05-01 ENCOUNTER — Ambulatory Visit
Admission: RE | Admit: 2018-05-01 | Discharge: 2018-05-01 | Disposition: A | Discharge status: Home / Self Care | Payer: PPO | Source: Ambulatory Visit | Attending: Family Medicine | Admitting: Family Medicine

## 2018-05-01 DIAGNOSIS — R928 Other abnormal and inconclusive findings on diagnostic imaging of breast: Secondary | ICD-10-CM | POA: Diagnosis not present

## 2018-05-01 DIAGNOSIS — R921 Mammographic calcification found on diagnostic imaging of breast: Secondary | ICD-10-CM | POA: Diagnosis not present

## 2018-05-07 ENCOUNTER — Ambulatory Visit
Admission: RE | Admit: 2018-05-07 | Discharge: 2018-05-07 | Disposition: A | Discharge status: Home / Self Care | Payer: PPO | Source: Ambulatory Visit | Attending: Family Medicine | Admitting: Family Medicine

## 2018-05-07 DIAGNOSIS — R921 Mammographic calcification found on diagnostic imaging of breast: Secondary | ICD-10-CM

## 2018-05-07 DIAGNOSIS — R928 Other abnormal and inconclusive findings on diagnostic imaging of breast: Secondary | ICD-10-CM

## 2018-05-07 DIAGNOSIS — C50919 Malignant neoplasm of unspecified site of unspecified female breast: Secondary | ICD-10-CM

## 2018-05-07 DIAGNOSIS — C50311 Malignant neoplasm of lower-inner quadrant of right female breast: Secondary | ICD-10-CM | POA: Diagnosis not present

## 2018-05-07 HISTORY — PX: BREAST BIOPSY: SHX20

## 2018-05-07 HISTORY — DX: Malignant neoplasm of unspecified site of unspecified female breast: C50.919

## 2018-05-09 ENCOUNTER — Encounter: Payer: Self-pay | Admitting: *Deleted

## 2018-05-09 NOTE — Progress Notes (Signed)
  Oncology Nurse Navigator Documentation  Navigator Location: [facility] (05/09/18 1400)   )Navigator Encounter Type: Introductory phone call (05/09/18 1400)   Abnormal Finding Date: 05/01/18 (05/09/18 1400) Confirmed Diagnosis Date: 05/09/18 (05/09/18 1400)                   Barriers/Navigation Needs: Education;Coordination of Care (05/09/18 1400) Education: Other (05/09/18 1400) Interventions: Coordination of Care (05/09/18 1400)   Coordination of Care: Appts (05/09/18 1400)                  Time Spent with Patient: 30 (05/09/18 1400)   Called patient to establish navigator services.  She had been notified today of her new diagnosis breast cancer.  Discussed navigation services.  She states she has seen Dr. Bary Castilla in the past and would like to see him again.  Informed I will call his office on Monday since they are closed this afternoon.  I will call her back with both surgical and medical oncology appointments.  She is agreeable.  States her sister had breast cancer and was a Marine scientist here at [facility].  States her name was Carl Best.  Will give patient educational literature at the time of her appointment.

## 2018-05-12 ENCOUNTER — Encounter: Payer: Self-pay | Admitting: *Deleted

## 2018-05-12 LAB — SURGICAL PATHOLOGY

## 2018-05-12 NOTE — Progress Notes (Signed)
Oncology Nurse Navigator Documentation  Oncology Nurse Navigator Flowsheets 10/05/2015 05/09/2018 05/12/2018  Navigator Location - [facility]  Referral date to RadOnc/MedOnc - - 05/16/2018  Navigator Encounter Type Telephone Introductory phone call Telephone  Telephone - - Outgoing Call  Abnormal Finding Date - 05/01/2018 -  Confirmed Diagnosis Date - 05/09/2018 -  Barriers/Navigation Needs - Education;Coordination of Care Coordination of Care  Education - Other -  Interventions - Coordination of Care Coordination of Care  Coordination of Care - Appts Appts  Time Spent with Patient - 30 15  Called and informed patient of her appointment with Dr. Bary Castilla tomorrow at 4:45.  She is to arrive at 4:00, take a photo ID and all her meds.  She is also scheduled to see Dr. Tasia Catchings at 11:00 on Friday.  She is to call with any questions or needs.

## 2018-05-13 ENCOUNTER — Encounter: Payer: Self-pay | Admitting: General Surgery

## 2018-05-13 ENCOUNTER — Ambulatory Visit (INDEPENDENT_AMBULATORY_CARE_PROVIDER_SITE_OTHER): Payer: PPO | Admitting: General Surgery

## 2018-05-13 ENCOUNTER — Ambulatory Visit: Payer: Self-pay

## 2018-05-13 VITALS — BP 176/82 | HR 95 | Resp 20 | Ht 62.0 in | Wt 189.0 lb

## 2018-05-13 DIAGNOSIS — Z170 Estrogen receptor positive status [ER+]: Secondary | ICD-10-CM

## 2018-05-13 DIAGNOSIS — C50311 Malignant neoplasm of lower-inner quadrant of right female breast: Secondary | ICD-10-CM | POA: Diagnosis not present

## 2018-05-13 NOTE — Progress Notes (Signed)
Patient ID: Tammy Choi, female   DOB: [birth year], 72 y.o.   MRN: [id number]  Chief Complaint  Patient presents with  . Breast Problem    HPI Tammy Choi is a 72 y.o. female.  who presents for a breast evaluation referred by Dr Caryl Bis and [place]. The most recent mammogram and right breast biopsy was done on 05-07-18.  Patient does perform regular self breast checks and gets regular mammograms done.  She states the biopsy hurt "really bad" that she thought she was going to pass out. She could not feel anything different in the breast. No breast injury or trauma.  She has COPD and gets short of breath with exertion. Chest CT was 02-09-16. She is here with her daughter, Tammy Choi. She retired from [employer].  HPI  Past Medical History:  Diagnosis Date  . Breast cancer ([place]) 05/07/2018  . COPD (chronic obstructive pulmonary disease) (H[facility])    Not on home o2  . HTN (hypertension)   . Hyperlipemia     Past Surgical History:  Procedure Laterality Date  . ABDOMINAL HYSTERECTOMY    . BREAST BIOPSY Left 04/30/2013   neg core  . BREAST BIOPSY Right 05/07/2018   right breast stereo x clip INVASIVE MAMMARY CARCINOMA  . COLONOSCOPY  2014   Dr Tammy Choi  . Left Leg Surgery    . TONSILLECTOMY      Family History  Problem Relation Age of Onset  . Kidney disease Mother   . Heart disease Father   . Breast cancer Sister 12  . Colon cancer Neg Hx     Social History Social History   Tobacco Use  . Smoking status: Former Smoker    Packs/day: 1.00    Years: 35.00    Pack years: 35.00    Types: Cigarettes    Last attempt to quit: 12/20/2015    Years since quitting: 2.4  . Smokeless tobacco: Never Used  Substance Use Topics  . Alcohol use: No    Alcohol/week: 0.0 standard drinks  . Drug use: No    Allergies  Allergen Reactions  . Penicillins Anaphylaxis and Other (See Comments)    Has patient had a PCN reaction causing immediate rash, facial/tongue/throat  swelling, SOB or lightheadedness with hypotension: Yes Has patient had a PCN reaction causing severe rash involving mucus membranes or skin necrosis: No Has patient had a PCN reaction that required hospitalization No Has patient had a PCN reaction occurring within the last 10 years: No If all of the above answers are "NO", then may proceed with Cephalosporin use.    Current Outpatient Medications  Medication Sig Dispense Refill  . albuterol (PROAIR HFA) 108 (90 Base) MCG/ACT inhaler Inhale 2 puffs into the lungs every 6 (six) hours as needed for wheezing or shortness of breath. 1 Inhaler 2  . amLODipine (NORVASC) 5 MG tablet TAKE 1 TABLET BY MOUTH ONCE DAILY 90 tablet 3  . budesonide-formoterol (SYMBICORT) 160-4.5 MCG/ACT inhaler Inhale 2 puffs into the lungs 2 (two) times daily. 1 Inhaler 5  . cholecalciferol (VITAMIN D) 1000 UNITS tablet Take 2,000 Units by mouth daily.    Marland Kitchen FLUoxetine (PROZAC) 40 MG capsule TAKE 1 CAPSULE BY MOUTH ONCE DAILY 90 capsule 0  . lisinopril (PRINIVIL,ZESTRIL) 40 MG tablet TAKE 1 TABLET BY MOUTH ONCE DAILY 90 tablet 3  . Multiple Vitamins-Minerals (MULTIVITAMIN WITH MINERALS) tablet Take 1 tablet by mouth daily.    . rosuvastatin (CRESTOR) 5 MG tablet Take 5 mg by  mouth daily.  3   No current facility-administered medications for this visit.     Review of Systems Review of Systems  Constitutional: Negative.   Respiratory: Positive for shortness of breath.   Cardiovascular: Negative.     Blood pressure (!) 176/82, pulse 95, resp. rate 20, height _0  (1.575 m), weight 189 lb (85.7 kg), SpO2 96 %.  Physical Exam Physical Exam  Constitutional: She is oriented to person, place, and time. She appears well-developed and well-nourished.  HENT:  Mouth/Throat: Oropharynx is clear and moist.  Eyes: Conjunctivae are normal. No scleral icterus.  Neck: Neck supple.  Cardiovascular: Normal rate, regular rhythm and normal heart sounds.  Pulmonary/Chest: Effort  normal and breath sounds normal. Right breast exhibits skin change. Right breast exhibits no inverted nipple, no mass, no nipple discharge and no tenderness. Left breast exhibits no inverted nipple, no mass, no nipple discharge, no skin change and no tenderness.  Bruising at biopsy site.     Lymphadenopathy:    She has no cervical adenopathy.    She has no axillary adenopathy.       Right: No supraclavicular adenopathy present.       Left: No supraclavicular adenopathy present.  Neurological: She is alert and oriented to person, place, and time.  Skin: Skin is warm and dry.  Psychiatric: Her behavior is normal.    Data Reviewed May 07, 2018 biopsy A. BREAST CALCIFICATIONS, RIGHT LOWER INNER QUADRANT; STEREOTACTIC  BIOPSY:  - INVASIVE MAMMARY CARCINOMA, NO SPECIAL TYPE.  - DUCTAL CARCINOMA IN SITU (DCIS).  - CALCIFICATIONS ASSOCIATED WITH DCIS.   Size of invasive carcinoma: 4 mm in this sample  Histologic grade of invasive carcinoma: Grade 2            Glandular/tubular differentiation score: 3            Nuclear pleomorphism score: 2            Mitotic rate score: 1            Total score: 6  Ductal carcinoma in situ: Present, nuclear grade 2, without necrosis  Lymphovascular invasion: Not identified  BREAST BIOMARKER TESTS  Estrogen Receptor (ER) Status: POSITIVE  Percentage of cells with nuclear positivity: > 90%  Average intensity of staining: Strong   Progesterone Receptor (PgR) Status: NEGATIVE (less than 1%)  Internal control cells present in stain as expected   HER2 (by immunohistochemistry): NEGATIVE (1+)   Bilateral diagnostic mammograms dated May 01, 2018 for follow-up of an indeterminate group of microcalcifications in the right breast were reviewed.  Area of concern measured 7 x 7 x 9 mm.  Post biopsy clip placement of May 07, 2018 reported an X shaped marker at the site of the previous microcalcifications.   Review of the post biopsy films showed scant residual microcalcifications.  Ultrasound examination of the right breast was undertaken to determine if preoperative wire localization would be required.  There is a generous biopsy cavity in the right breast at the 2 o'clock position, 5 cm from the nipple.  This measures up to 1.45 cm in diameter.  Biopsy clip is evident.  This is at a depth just about 1 cm below the skin.  BI-RADS-6.  CBC and comprehensive metabolic panel March 14, 4157 was reviewed.  Creatinine 0.89.  Normal electrolytes, normal liver function studies.  Hemoglobin 13.5 with an MCV of 83.8, white blood cell count of 8500, platelet count of 265,000. 1C of 6.0, improved from 6.21-year earlier.  Assessment    Clinical stage I carcinoma of the right breast.    Plan     The majority of the visit was spent reviewing the options for breast cancer treatment. Breast conservation with lumpectomy and radiation therapy  was presented as equivalent to mastectomy for long-term control. The pros and cons of each treatment regimen were reviewed. The indications for additional therapy such as chemotherapy were touched on briefly, realizing that the majority of information required to determine if chemotherapy would be of benefit is not available at this time. The availability of consultation services second surgical opinion were reviewed.    Patient is presently scheduled for evaluation by medical oncology on May 16, 2018.  The patient is aware that many of the data points required for recommendations regarding chemotherapy will not be available until after surgical intervention.  Website and brochures provided regarding surgical options.   HPI, Physical Exam, Assessment and Plan have been scribed under the direction and in the presence of Robert Bellow, MD. Karie Fetch, RN  I have completed the exam and reviewed the above documentation for accuracy and completeness.  I agree with the  above.  Haematologist has been used and any errors in dictation or transcription are unintentional.  Hervey Ard, M.D., F.A.C.S. [name] [place] 05/14/2018, 4:51 PM

## 2018-05-14 DIAGNOSIS — Z170 Estrogen receptor positive status [ER+]: Principal | ICD-10-CM

## 2018-05-14 DIAGNOSIS — C50311 Malignant neoplasm of lower-inner quadrant of right female breast: Secondary | ICD-10-CM | POA: Insufficient documentation

## 2018-05-16 ENCOUNTER — Inpatient Hospital Stay: Payer: PPO | Admitting: Oncology

## 2018-05-22 ENCOUNTER — Other Ambulatory Visit: Payer: Self-pay | Admitting: *Deleted

## 2018-05-23 ENCOUNTER — Other Ambulatory Visit: Payer: Self-pay

## 2018-05-23 ENCOUNTER — Encounter: Payer: Self-pay | Admitting: Oncology

## 2018-05-23 ENCOUNTER — Encounter: Payer: Self-pay | Admitting: *Deleted

## 2018-05-23 ENCOUNTER — Inpatient Hospital Stay: Payer: PPO | Attending: Oncology | Admitting: Oncology

## 2018-05-23 VITALS — BP 140/83 | HR 85 | Temp 98.7°F | Resp 18 | Ht 62.0 in | Wt 187.7 lb

## 2018-05-23 DIAGNOSIS — Z803 Family history of malignant neoplasm of breast: Secondary | ICD-10-CM | POA: Insufficient documentation

## 2018-05-23 DIAGNOSIS — Z170 Estrogen receptor positive status [ER+]: Secondary | ICD-10-CM | POA: Diagnosis not present

## 2018-05-23 DIAGNOSIS — C50311 Malignant neoplasm of lower-inner quadrant of right female breast: Secondary | ICD-10-CM | POA: Diagnosis not present

## 2018-05-23 NOTE — Progress Notes (Signed)
Hematology/Oncology Consult note [facility] Telephone:([phone] [phone]   Patient Care Team: Leone Haven, MD as PCP - General (Family Medicine) Bary Castilla, [name], MD (General Surgery)  REFERRING PROVIDER: Dr. Bary Castilla CHIEF COMPLAINTS/REASON FOR VISIT:  Evaluation of breast cancer  HISTORY OF PRESENTING ILLNESS:  Tammy Choi is a  72 y.o.  female with PMH listed below who was referred to me for evaluation of newly diagnosed breast cancer. Patient had mammogram 05/01/2018 which showed indeterminate microcalcification in the medial right breast measuring 0.9 x 0.7 x 0.7 cm Biopsy pathology showed: Invasive mammary carcinoma, DCIS, calcification associated with DCIS, grade 2, ER positive PR negative, HER-2 negative (IHC 1+)  Nipple discharge: Denies Family history: Sister passed away from breast cancer History of radiation to chest: denies.  Previous breast surgery: Left breast biopsy with benign etiology.  Patient was accompanied by multiple family members, including daughter and granddaughter.  Review of Systems  Constitutional: Negative for chills, fever, malaise/fatigue and weight loss.  HENT: Negative for nosebleeds and sore throat.   Eyes: Negative for double vision, photophobia and redness.  Respiratory: Negative for cough, shortness of breath and wheezing.   Cardiovascular: Negative for chest pain, palpitations and orthopnea.  Gastrointestinal: Negative for abdominal pain, blood in stool, nausea and vomiting.  Genitourinary: Negative for dysuria.  Musculoskeletal: Negative for back pain, myalgias and neck pain.  Skin: Negative for itching and rash.  Neurological: Negative for dizziness, tingling and tremors.  Endo/Heme/Allergies: Negative for environmental allergies. Does not bruise/bleed easily.  Psychiatric/Behavioral: Negative for depression. The patient is nervous/anxious.     MEDICAL HISTORY:  Past Medical History:   Diagnosis Date  . Breast cancer ([place]) 05/07/2018  . COPD (chronic obstructive pulmonary disease) (HCC)    Not on home o2  . HTN (hypertension)   . Hyperlipemia     SURGICAL HISTORY: Past Surgical History:  Procedure Laterality Date  . ABDOMINAL HYSTERECTOMY    . BREAST BIOPSY Left 04/30/2013   neg core  . BREAST BIOPSY Right 05/07/2018   right breast stereo x clip INVASIVE MAMMARY CARCINOMA  . COLONOSCOPY  2014   Dr Bary Castilla  . Left Leg Surgery    . TONSILLECTOMY      SOCIAL HISTORY: Social History   Socioeconomic History  . Marital status: Single    Spouse name: Not on file  . Number of children: Not on file  . Years of education: Not on file  . Highest education level: Not on file  Occupational History  . 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 Financial resource strain: Not on file  . Food insecurity:    Worry: Not on file    Inability: Not on file  . Transportation needs:    Medical: Not on file    Non-medical: Not on file  Tobacco Use  . Smoking status: Former Smoker    Packs/day: 1.00    Years: 35.00    Pack years: 35.00    Types: Cigarettes    Last attempt to quit: 12/20/2015    Years since quitting: 2.4  . Smokeless tobacco: Never Used  Substance and Sexual Activity  . Alcohol use: No    Alcohol/week: 0.0 standard drinks  . Drug use: No  . Sexual activity: Not on file  Lifestyle  . Physical activity:    Days per week: Not on file    Minutes per session: Not on file  . Stress: Not on file  Relationships  . Social connections:    Talks on phone: Not on file    Gets together: Not on file    Attends religious service: Not on file    Active member of club or organization: Not on file    Attends meetings of clubs or organizations: Not on file    Relationship status: Not on file  . Intimate partner violence:    Fear of current or ex partner: Not on file    Emotionally abused: Not on file    Physically  abused: Not on file    Forced sexual activity: Not on file  Other Topics Concern  . Not on file  Social History Narrative   Lives with daughter at home. Independent at baseline.    FAMILY HISTORY: Family History  Problem Relation Age of Onset  . Kidney disease Mother   . Heart disease Father   . Breast cancer Sister 77  . Colon cancer Neg Hx     ALLERGIES:  is allergic to penicillins.  MEDICATIONS:  Current Outpatient Medications  Medication Sig Dispense Refill  . albuterol (PROAIR HFA) 108 (90 Base) MCG/ACT inhaler Inhale 2 puffs into the lungs every 6 (six) hours as needed for wheezing or shortness of breath. 1 Inhaler 2  . budesonide-formoterol (SYMBICORT) 160-4.5 MCG/ACT inhaler Inhale 2 puffs into the lungs 2 (two) times daily. 1 Inhaler 5  . cholecalciferol (VITAMIN D) 1000 UNITS tablet Take 2,000 Units by mouth daily.    Marland Kitchen FLUoxetine (PROZAC) 40 MG capsule TAKE 1 CAPSULE BY MOUTH ONCE DAILY 90 capsule 0  . lisinopril (PRINIVIL,ZESTRIL) 40 MG tablet TAKE 1 TABLET BY MOUTH ONCE DAILY 90 tablet 3  . Multiple Vitamins-Minerals (MULTIVITAMIN WITH MINERALS) tablet Take 1 tablet by mouth daily.    . rosuvastatin (CRESTOR) 5 MG tablet Take 5 mg by mouth daily.  3  . amLODipine (NORVASC) 5 MG tablet TAKE 1 TABLET BY MOUTH ONCE DAILY (Patient not taking: Reported on 05/23/2018) 90 tablet 3   No current facility-administered medications for this visit.      PHYSICAL EXAMINATION: ECOG PERFORMANCE STATUS: 0 - Asymptomatic Vitals:   05/23/18 1112  BP: 140/83  Pulse: 85  Resp: 18  Temp: 98.7 F (37.1 C)   Filed Weights   05/23/18 1112  Weight: 187 lb 11.2 oz (85.1 kg)    Physical Exam  Constitutional: She is oriented to person, place, and time. No distress.  HENT:  Head: Normocephalic and atraumatic.  Mouth/Throat: Oropharynx is clear and moist.  Eyes: Pupils are equal, round, and reactive to light. EOM are normal. No scleral icterus.  Neck: Normal range of motion. Neck  supple.  Cardiovascular: Normal rate, regular rhythm and normal heart sounds.  Pulmonary/Chest: Effort normal. No respiratory distress. She has no wheezes.  Abdominal: Soft. Bowel sounds are normal. She exhibits no distension and no mass. There is no tenderness.  Musculoskeletal: Normal range of motion. She exhibits no edema or deformity.  Neurological: She is alert and oriented to person, place, and time. No cranial nerve deficit. Coordination normal.  Skin: Skin is warm and dry. No rash noted. No erythema.  Psychiatric: She has a normal mood and affect. Her behavior is normal. Thought content normal.  Breast examination, right breast status post biopsy, skin bruising.  Cannot appreciate any breast mass.  She has a small mobile soft tissue mass in the right axillary, known to patient as a lipoma which has been chronically there for many years.   LABORATORY DATA:  I have reviewed the data as listed Lab Results  Component Value Date   WBC 8.5 03/14/2018   HGB 13.5 03/14/2018   HCT 40.9 03/14/2018   MCV 83.8 03/14/2018   PLT 265 03/14/2018   Recent Labs    03/14/18 1459  NA 140  K 4.9  CL 102  CO2 24  GLUCOSE 91  BUN 22  CREATININE 0.89  CALCIUM 10.0  PROT 7.0  AST 16  ALT 13  BILITOT 0.4   Iron/TIBC/Ferritin/ %Sat No results found for: IRON, TIBC, FERRITIN, IRONPCTSAT      ASSESSMENT & PLAN:  1. Malignant neoplasm of lower-inner quadrant of right breast of female, estrogen receptor positive ([place])   2. Family history of breast cancer   Clinically stage I breast cancer ER positive, PR negative, HER-2 negative. Mammogram was independently reviewed.  Both image and pathology findings were discussed with patient.  Discussed with patient about breast cancer diagnosis, size of the mass, hormone receptor positivity and treatment plan.. Recommend genetic counseling for genetic testing given her family history and personal breast cancer history. Patient wants to discuss with her  family member and decide. She prefers lumpectomy regardless of her genetic testing results.  Recommend breast conservation with lumpectomy and sentinel lymph node biopsy. She will need radiation therapy.  Additional therapy such as chemotherapy pending on final pathology reports.   All questions were answered. The patient knows to call the clinic with any problems questions or concerns.  Return of visit: To be determined, will be coordinated by RN navigator Ren[place]. Thank you for this kind referral and the opportunity to participate in the care of this patient. A copy of today's note is routed to referring provider  Total face to face encounter time for this patient visit was 60 min. >50% of the time was  spent in counseling and coordination of care.    Earlie Server, MD, PhD Hematology Oncology [facility] at [facility] Pager- [phone] 05/23/2018

## 2018-05-23 NOTE — Progress Notes (Signed)
Patient here for follow. Pt very anxious. Right breast sore from biopsy.

## 2018-05-23 NOTE — Progress Notes (Signed)
  Oncology Nurse Navigator Documentation  Navigator Location: [facility]Med [facility]c (05/23/18 1200) Referral date to RadOnc/MedOnc: 05/23/18 (05/23/18 1200) )Navigator Encounter Type: Initial MedOnc (05/23/18 1200)         Genetic Counseling Date: 05/23/18 (05/23/18 1200) Genetic Counseling Type: Non-Urgent (05/23/18 1200)         Patient Visit Type: MedOnc (05/23/18 1200) Treatment Phase: Pre-Tx/Tx Discussion (05/23/18 1200)                            Time Spent with Patient: 60 (05/23/18 1200)   Met patient, her daughter, son and granddaughter today during her initial medical oncology consult.  Patient tearful today.  Her sister, Carl Best was treated here for breast cancer.  Offered support.  Patient had picked up her educational literature at Dr. Dwyane Luo office.  No needs at this time.

## 2018-05-26 ENCOUNTER — Telehealth: Payer: Self-pay | Admitting: Genetic Counselor

## 2018-05-26 NOTE — Telephone Encounter (Signed)
Dr. Tasia Catchings is referring Tammy Choi for genetic counseling due to a personal and family history of breast cancer. I left her a message to call and schedule this telegenetics visit to be done by phone at her convenience.   Steele Berg, [place], [place] Genetic Counselor Phone: [phone]

## 2018-05-26 NOTE — Telephone Encounter (Signed)
Ms. Berquist called back and opted to schedule her genetic counseling phone appt on Thursday 05/29/18 at [place], [place], [facility] Genetic Counselor Phone: [phone] [email or web address]

## 2018-05-29 ENCOUNTER — Telehealth: Payer: Self-pay | Admitting: *Deleted

## 2018-05-29 ENCOUNTER — Telehealth: Payer: Self-pay | Admitting: Genetic Counselor

## 2018-05-29 ENCOUNTER — Other Ambulatory Visit: Payer: Self-pay | Admitting: General Surgery

## 2018-05-29 ENCOUNTER — Encounter: Payer: Self-pay | Admitting: Genetic Counselor

## 2018-05-29 MED ORDER — LIDOCAINE-PRILOCAINE 2.5-2.5 % EX CREA: TOPICAL_CREAM | CUTANEOUS | 0 refills | Status: DC

## 2018-05-29 NOTE — Telephone Encounter (Signed)
Cancer Genetics            Telegenetics Initial Visit    Patient Name: Tammy Choi Patient DOB: 11-24-1945 Patient Age: 72 y.o. Phone Call Date: 05/29/2018  Referring Provider: Earlie Server, MD  Reason for Visit: Evaluate for hereditary susceptibility to cancer    Assessment and Plan:  . Ms. Rison's personal history of breast cancer at age 76 is not likely due to a hereditary predisposition to cancer. Her family history includes a 2nd-degree relative (her maternal half-sister) with breast cancer at age 58. She is related through her mother who was cancer-free at age 59.  Marland Kitchen Testing is available to determine whether she has a pathogenic mutation that will impact her screening and risk-reduction for cancer, but we discussed that her risk of having a mutation is relatively low. A negative result will be reassuring.  . Ms. Seiple did not wish to pursue genetic testing today. She stated that she will review this information when she meets with Dr. Bary Castilla today and will call me if she changes her mind.   . Ms. Gervasi is encouraged to remain in contact with Cancer Genetics annually so that we can update the family history and inform her of any changes in cancer genetics and testing that may be of benefit for this family.    Dr. Grayland Ormond was available for questions concerning this case. Total time spent by counseling by phone was approximately 25 minutes.   _____________________________________________________________________   History of Present Illness: Tammy Choi, a 72 y.o. female, was referred for genetic counseling to discuss the possibility of a hereditary predisposition to cancer and discuss whether genetic testing is warranted. This was a telegenetics visit via phone.  Ms. Shoe was recently diagnosed with breast cancer at the age of 88. She stated she is likely having a lumpectomy, but will be meeting with Dr. Bary Castilla to discuss this  further today.  The breast tumor was ER positive, PR negative, and HER2 negative.  She reports a hysterectomy (ovaries intact) sometime in her 8s.   Past Medical History:  Diagnosis Date  . Breast cancer ([place]) 05/07/2018  . COPD (chronic obstructive pulmonary disease) (HCC)    Not on home o2  . HTN (hypertension)   . Hyperlipemia     Past Surgical History:  Procedure Laterality Date  . ABDOMINAL HYSTERECTOMY    . BREAST BIOPSY Left 04/30/2013   neg core  . BREAST BIOPSY Right 05/07/2018   right breast stereo x clip INVASIVE MAMMARY CARCINOMA  . COLONOSCOPY  2014   Dr Bary Castilla  . Left Leg Surgery    . TONSILLECTOMY      Family History: Significant diagnoses include the following:  Family History  Problem Relation Age of Onset  . Kidney disease Mother        deceased 19  . Heart disease Father        deceased 62  . Breast cancer Other 46       maternal half-sister; deceased 76  . Colon cancer Neg Hx     Additionally, Ms. Dearman has a son (age 75) and a daughter (age 2). She has no full siblings. She had one maternal half-sister (noted) above who had breast cancer. Her mother died at 72; cancer-free. Her mother had a hysterectomy, but Ms. Myhand was unsure whether it included a BSO. Her mother had 3 sisters and 2 brothers. Her father died at  86; cancer-free. Her father had 2 sisters and 4 brothers.  Ms. Skalski's ancestry is Caucasian - NOS. There is no known Jewish ancestry and no consanguinity.  Discussion: We reviewed the characteristics, features and inheritance patterns of hereditary cancer syndromes. We discussed her risk of harboring a mutation in the context of her personal and family history. We discussed the process of genetic testing, insurance coverage and implications of results: positive, negative and variant of uncertain significance (VUS).   Ms. Mancebo's questions were answered to her satisfaction today and she is welcome to call with any  additional questions or concerns. Thank you for the referral and allowing [place] to share in the care of your patient.    Steele Berg, MS, [place] Certified Genetic Counselor phone: [phone]

## 2018-05-29 NOTE — Telephone Encounter (Signed)
Patient called the office and wanted to let us know that she has decided on a right breat lumpectomy with radiation.   The patient also states she has spoken with Dr. Tasia Catchings and was told she could have genetic testing if she wanted to but felt this was age related.  Patient states she will have genetic testing done if Dr. Bary Castilla strongly recommends but wanted his opinion.   Note to Dr. Bary Castilla to complete surgery sheet.   Patient aware we will contact her once surgery scheduling sheet is complete.   Best number to reach the patient is on [phone].

## 2018-05-30 ENCOUNTER — Other Ambulatory Visit: Payer: Self-pay | Admitting: General Surgery

## 2018-05-30 DIAGNOSIS — C50311 Malignant neoplasm of lower-inner quadrant of right female breast: Secondary | ICD-10-CM

## 2018-05-30 DIAGNOSIS — Z170 Estrogen receptor positive status [ER+]: Principal | ICD-10-CM

## 2018-06-03 ENCOUNTER — Telehealth: Payer: Self-pay

## 2018-06-03 DIAGNOSIS — Z170 Estrogen receptor positive status [ER+]: Principal | ICD-10-CM

## 2018-06-03 DIAGNOSIS — C50311 Malignant neoplasm of lower-inner quadrant of right female breast: Secondary | ICD-10-CM

## 2018-06-03 NOTE — Telephone Encounter (Signed)
Patient notified that she will pre admit on 06/10/18 at 9:45 am.

## 2018-06-03 NOTE — Telephone Encounter (Signed)
Patient will arrive at the Radiology desk on 06/16/18 for surgery at 9:45 am. She is aware to use EMLA cream applying to her right areola one hour prior to leaving and to cover with plastic wrap.

## 2018-06-04 ENCOUNTER — Telehealth: Payer: Self-pay | Admitting: *Deleted

## 2018-06-04 NOTE — Telephone Encounter (Signed)
-----   Message from Lesly Rubenstein, LPN sent at 7/90/3833  9:30 AM EDT ----- Regarding: Genetic Testing Can you call the patient to set up genetic testing for her. She has [facility] insurance.

## 2018-06-04 NOTE — Telephone Encounter (Signed)
Needs nurse appointment for BRCA testing. Review instructions, NPO 1 hour before appointment.

## 2018-06-05 NOTE — Telephone Encounter (Signed)
Complete at next appointment

## 2018-06-06 ENCOUNTER — Telehealth: Payer: Self-pay | Admitting: Genetic Counselor

## 2018-06-06 ENCOUNTER — Other Ambulatory Visit: Payer: Self-pay | Admitting: Genetic Counselor

## 2018-06-06 DIAGNOSIS — C50311 Malignant neoplasm of lower-inner quadrant of right female breast: Secondary | ICD-10-CM

## 2018-06-06 DIAGNOSIS — Z170 Estrogen receptor positive status [ER+]: Principal | ICD-10-CM

## 2018-06-06 NOTE — Telephone Encounter (Signed)
Spoke with Ms. Fyock to let her know [facility] indicated that cost of testing for her through insurance is $0. She will return to [facility] lab sometime after surgery for a blood draw.  She reported that Dr. Dwyane Luo office offered to coordinate testing via saliva. I explained that I had already placed an order through [facility] and prefer to have analysis via blood. I don't know whether Dr. Dwyane Luo office uses [facility] or a different lab.  Ms. Lepp understood and agreed to get her blood drawn at [facility] for [facility]'s 84 gene panel.  Steele Berg, MS, [place] Genetic Counselor Phone: [phone] [email or web address]

## 2018-06-10 ENCOUNTER — Other Ambulatory Visit: Payer: Self-pay

## 2018-06-10 ENCOUNTER — Encounter
Admission: RE | Admit: 2018-06-10 | Discharge: 2018-06-10 | Disposition: A | Discharge status: Home / Self Care | Payer: PPO | Source: Ambulatory Visit | Attending: General Surgery | Admitting: General Surgery

## 2018-06-10 DIAGNOSIS — Z01818 Encounter for other preprocedural examination: Secondary | ICD-10-CM | POA: Insufficient documentation

## 2018-06-10 HISTORY — DX: Depression, unspecified: F32.A

## 2018-06-10 HISTORY — DX: Major depressive disorder, single episode, unspecified: F32.9

## 2018-06-10 NOTE — Patient Instructions (Addendum)
Your procedure is scheduled on: 9/16 Report to Radiology at 9:45  Remember: Instructions that are not followed completely may result in serious medical risk,  up to and including death, or upon the discretion of your surgeon and anesthesiologist your  surgery may need to be rescheduled.     _X__ 1. Do not eat food after midnight the night before your procedure.                 No gum chewing or hard candies. You may drink clear liquids up to 2 hours                 before you are scheduled to arrive for your surgery- DO not drink clear                 liquids within 2 hours of the start of your surgery.                 Clear Liquids include:  water, apple juice without pulp, clear carbohydrate                 drink such as Clearfast of Gatorade, Black Coffee or Tea (Do not add                 anything to coffee or tea).  __X__2.  On the morning of surgery brush your teeth with toothpaste and water, you                may rinse your mouth with mouthwash if you wish.  Do not swallow any toothpaste of mouthwash.     ___ 3.  No Alcohol for 24 hours before or after surgery.   ___ 4.  Do Not Smoke or use e-cigarettes For 24 Hours Prior to Your Surgery.                 Do not use any chewable tobacco products for at least 6 hours prior to                 surgery.  ____  5.  Bring all medications with you on the day of surgery if instructed.   __x__  6.  Notify your doctor if there is any change in your medical condition      (cold, fever, infections).     Do not wear jewelry, make-up, hairpins, clips or nail polish. Do not wear lotions, powders, or perfumes. You may wear deodorant. Do not shave 48 hours prior to surgery. Men may shave face and neck. Do not bring valuables to the hospital.    [facility] is not responsible for any belongings or valuables.  Contacts, dentures or bridgework may not be worn into surgery. Leave your suitcase in the car. After surgery  it may be brought to your room. For patients admitted to the hospital, discharge time is determined by your treatment team.   Patients discharged the day of surgery will not be allowed to drive home.   Please read over the following fact sheets that you were given:    _x___ Take these medicines the morning of surgery with A SIP OF WATER:    1. acetaminophen (TYLENOL) 500 MG tablet if needed  2.   3.   4.  5.  6.  ____ Fleet Enema (as directed)   ____ Use CHG Soap as directed  __x__ Use inhalers on the day of surgeryalbuterol (PROAIR HFA) 108 (90 Base) MCG/ACT inhaler and bring with  you to the hospital,budesonide-formoterol (SYMBICORT) 160-4.5 MCG/ACT inhaler  ____ Stop metformin 2 days prior to surgery    ____ Take 1/2 of usual insulin dose the night before surgery. No insulin the morning          of surgery.   ____ Stop Coumadin/Plavix/aspirin on  __x__ Stop Anti-inflammatories  May take tylenol   ____ Stop supplements until after surgery.    ____ Bring C-Pap to the hospital.   1 part rubbing alcohol and 2 parts water in a snack bag then freeze Obtain stool softener colace

## 2018-06-12 ENCOUNTER — Ambulatory Visit (INDEPENDENT_AMBULATORY_CARE_PROVIDER_SITE_OTHER): Payer: PPO | Admitting: General Surgery

## 2018-06-12 ENCOUNTER — Encounter: Payer: Self-pay | Admitting: General Surgery

## 2018-06-12 ENCOUNTER — Ambulatory Visit (INDEPENDENT_AMBULATORY_CARE_PROVIDER_SITE_OTHER): Payer: PPO

## 2018-06-12 VITALS — BP 128/72 | HR 72 | Resp 16 | Ht 62.0 in | Wt 187.0 lb

## 2018-06-12 DIAGNOSIS — Z170 Estrogen receptor positive status [ER+]: Secondary | ICD-10-CM | POA: Diagnosis not present

## 2018-06-12 DIAGNOSIS — C50311 Malignant neoplasm of lower-inner quadrant of right female breast: Secondary | ICD-10-CM

## 2018-06-12 NOTE — Progress Notes (Signed)
Patient ID: Tammy Choi, female   DOB: 1946/03/31, 72 y.o.   MRN: [id number]  Chief Complaint  Patient presents with  . Pre-op Exam    HPI Tammy Choi is a 72 y.o. female.  Here for pre op right breast wide excision 06-16-18. She is here with her daughter, [name].  Bra size: 38C.  HPI  Past Medical History:  Diagnosis Date  . Breast cancer ([facility]) 05/07/2018  . COPD (chronic obstructive pulmonary disease) ([facility])    Not on home o2  . Depression   . HTN (hypertension)   . Hyperlipemia     Past Surgical History:  Procedure Laterality Date  . ABDOMINAL HYSTERECTOMY    . BREAST BIOPSY Left 04/30/2013   neg core  . BREAST BIOPSY Right 05/07/2018   right breast stereo x clip INVASIVE MAMMARY CARCINOMA  . COLONOSCOPY  2014   Dr Bary Castilla  . Left Leg Surgery    . TONSILLECTOMY      Family History  Problem Relation Age of Onset  . Kidney disease Mother        deceased 65  . Heart disease Father        deceased 70  . Breast cancer Other 71       maternal half-sister; deceased 65  . Colon cancer Neg Hx     Social History Social History   Tobacco Use  . Smoking status: Former Smoker    Packs/day: 1.00    Years: 35.00    Pack years: 35.00    Types: Cigarettes    Last attempt to quit: 12/20/2015    Years since quitting: 2.4  . Smokeless tobacco: Never Used  Substance Use Topics  . Alcohol use: No    Alcohol/week: 0.0 standard drinks  . Drug use: No    Allergies  Allergen Reactions  . Penicillins Anaphylaxis and Other (See Comments)    Has patient had a PCN reaction causing immediate rash, facial/tongue/throat swelling, SOB or lightheadedness with hypotension: Yes Has patient had a PCN reaction causing severe rash involving mucus membranes or skin necrosis: No Has patient had a PCN reaction that required hospitalization No Has patient had a PCN reaction occurring within the last 10 years: No If all of the above answers are "NO", then may proceed  with Cephalosporin use.    Current Outpatient Medications  Medication Sig Dispense Refill  . acetaminophen (TYLENOL) 500 MG tablet Take 500-1,000 mg by mouth 2 (two) times daily as needed for moderate pain or headache.    . albuterol (PROAIR HFA) 108 (90 Base) MCG/ACT inhaler Inhale 2 puffs into the lungs every 6 (six) hours as needed for wheezing or shortness of breath. 1 Inhaler 2  . budesonide-formoterol (SYMBICORT) 160-4.5 MCG/ACT inhaler Inhale 2 puffs into the lungs 2 (two) times daily. 1 Inhaler 5  . cholecalciferol (VITAMIN D) 1000 UNITS tablet Take 2,000 Units by mouth every other day.     Marland Kitchen FLUoxetine (PROZAC) 40 MG capsule TAKE 1 CAPSULE BY MOUTH ONCE DAILY (Patient taking differently: Take 40 mg by mouth at bedtime. ) 90 capsule 0  . lidocaine-prilocaine (EMLA) cream Apply to the areola of the right breast 1 hour prior to coming to the hospital.  Cover with plastic wrap. 5 g 0  . lisinopril (PRINIVIL,ZESTRIL) 40 MG tablet TAKE 1 TABLET BY MOUTH ONCE DAILY (Patient taking differently: Take 40 mg by mouth at bedtime. ) 90 tablet 3  . Multiple Vitamins-Minerals (MULTIVITAMIN WITH MINERALS) tablet Take 1 tablet  by mouth every other day.     . rosuvastatin (CRESTOR) 5 MG tablet Take 5 mg by mouth at bedtime.   3   No current facility-administered medications for this visit.     Review of Systems Review of Systems  Constitutional: Negative.   Respiratory: Negative.   Cardiovascular: Negative.     Blood pressure 128/72, pulse 72, resp. rate 16, height 5\' 2"  (1.575 m), weight 187 lb (84.8 kg), SpO2 96 %.  Physical Exam Physical Exam  Constitutional: She is oriented to person, place, and time. She appears well-developed and well-nourished.  HENT:  Mouth/Throat: Oropharynx is clear and moist.  Eyes: Conjunctivae are normal. No scleral icterus.  Neck: Neck supple.  Cardiovascular: Normal rate, regular rhythm and normal heart sounds.  Pulmonary/Chest: Effort normal and breath  sounds normal.  Breath sounds are moderately distant consistent with her known COPD.  Lymphadenopathy:    She has no cervical adenopathy.  Neurological: She is alert and oriented to person, place, and time.  Skin: Skin is warm and dry.  Psychiatric: Her behavior is normal.    Data Reviewed Ultrasound was completed to confirm that the previous biopsy site was still visible.  In the right breast at the 1 o'clock position 5 cm from the nipple a hypoechoic irregular mass measuring in maximum diameter 0.62 cm, 1.2 cm below the skin surface is identified corresponding to the original lesion.  Biopsy clip evident.  BI-RADS-6.  Assessment    Candidate for breast conservation.    Plan    Use of EMLA cream the morning of surgery to minimize discomfort during sentinel node injection reviewed.  Plans for wide excision and sentinel node biopsy discussed.        HPI, Physical Exam, Assessment and Plan have been scribed under the direction and in the presence of Robert Bellow, MD. Karie Fetch, RN   [name] Gurjit Loconte 06/13/2018, 3:17 PM

## 2018-06-12 NOTE — Patient Instructions (Signed)
The patient is aware to call back for any questions or new concerns.  

## 2018-06-13 ENCOUNTER — Other Ambulatory Visit: Payer: Self-pay | Admitting: Family Medicine

## 2018-06-16 ENCOUNTER — Ambulatory Visit: Payer: PPO | Admitting: Anesthesiology

## 2018-06-16 ENCOUNTER — Encounter
Admission: RE | Disposition: A | Discharge status: Home / Self Care | Payer: Self-pay | Source: Ambulatory Visit | Attending: General Surgery

## 2018-06-16 ENCOUNTER — Ambulatory Visit: Payer: PPO

## 2018-06-16 ENCOUNTER — Encounter
Admission: RE | Admit: 2018-06-16 | Discharge: 2018-06-16 | Disposition: A | Discharge status: Home / Self Care | Payer: PPO | Source: Ambulatory Visit | Attending: General Surgery | Admitting: General Surgery

## 2018-06-16 ENCOUNTER — Encounter: Payer: Self-pay | Admitting: *Deleted

## 2018-06-16 ENCOUNTER — Ambulatory Visit
Admission: RE | Admit: 2018-06-16 | Discharge: 2018-06-16 | Disposition: A | Discharge status: Home / Self Care | Payer: PPO | Source: Ambulatory Visit | Attending: General Surgery | Admitting: General Surgery

## 2018-06-16 ENCOUNTER — Other Ambulatory Visit: Payer: Self-pay

## 2018-06-16 DIAGNOSIS — N6310 Unspecified lump in the right breast, unspecified quadrant: Secondary | ICD-10-CM

## 2018-06-16 DIAGNOSIS — K219 Gastro-esophageal reflux disease without esophagitis: Secondary | ICD-10-CM | POA: Diagnosis not present

## 2018-06-16 DIAGNOSIS — D0511 Intraductal carcinoma in situ of right breast: Secondary | ICD-10-CM | POA: Diagnosis not present

## 2018-06-16 DIAGNOSIS — Z87891 Personal history of nicotine dependence: Secondary | ICD-10-CM | POA: Diagnosis not present

## 2018-06-16 DIAGNOSIS — C50311 Malignant neoplasm of lower-inner quadrant of right female breast: Secondary | ICD-10-CM

## 2018-06-16 DIAGNOSIS — F418 Other specified anxiety disorders: Secondary | ICD-10-CM | POA: Insufficient documentation

## 2018-06-16 DIAGNOSIS — Z79899 Other long term (current) drug therapy: Secondary | ICD-10-CM | POA: Diagnosis not present

## 2018-06-16 DIAGNOSIS — J449 Chronic obstructive pulmonary disease, unspecified: Secondary | ICD-10-CM | POA: Diagnosis not present

## 2018-06-16 DIAGNOSIS — G4730 Sleep apnea, unspecified: Secondary | ICD-10-CM | POA: Insufficient documentation

## 2018-06-16 DIAGNOSIS — C50911 Malignant neoplasm of unspecified site of right female breast: Secondary | ICD-10-CM | POA: Diagnosis not present

## 2018-06-16 DIAGNOSIS — C50211 Malignant neoplasm of upper-inner quadrant of right female breast: Secondary | ICD-10-CM | POA: Diagnosis not present

## 2018-06-16 DIAGNOSIS — Z170 Estrogen receptor positive status [ER+]: Secondary | ICD-10-CM

## 2018-06-16 DIAGNOSIS — E785 Hyperlipidemia, unspecified: Secondary | ICD-10-CM | POA: Diagnosis not present

## 2018-06-16 DIAGNOSIS — I10 Essential (primary) hypertension: Secondary | ICD-10-CM | POA: Insufficient documentation

## 2018-06-16 DIAGNOSIS — D241 Benign neoplasm of right breast: Secondary | ICD-10-CM | POA: Diagnosis not present

## 2018-06-16 DIAGNOSIS — Z7951 Long term (current) use of inhaled steroids: Secondary | ICD-10-CM | POA: Insufficient documentation

## 2018-06-16 DIAGNOSIS — C50511 Malignant neoplasm of lower-outer quadrant of right female breast: Secondary | ICD-10-CM | POA: Diagnosis not present

## 2018-06-16 HISTORY — PX: BREAST LUMPECTOMY WITH SENTINEL LYMPH NODE BIOPSY: SHX5597

## 2018-06-16 SURGERY — BREAST LUMPECTOMY WITH SENTINEL LYMPH NODE BX
Anesthesia: Choice | Site: Breast | Laterality: Right

## 2018-06-16 MED ORDER — GABAPENTIN 300 MG PO CAPS
ORAL_CAPSULE | ORAL | Status: AC
  Administered 2018-06-16: 300 mg via ORAL
  Filled 2018-06-16: qty 1

## 2018-06-16 MED ORDER — MIDAZOLAM HCL 2 MG/2ML IJ SOLN
INTRAMUSCULAR | Status: DC | PRN
  Administered 2018-06-16: 2 mg via INTRAVENOUS

## 2018-06-16 MED ORDER — GABAPENTIN 300 MG PO CAPS
300.0000 mg | ORAL_CAPSULE | ORAL | Status: AC
  Administered 2018-06-16: 300 mg via ORAL

## 2018-06-16 MED ORDER — GLYCOPYRROLATE 0.2 MG/ML IJ SOLN
INTRAMUSCULAR | Status: AC
  Filled 2018-06-16: qty 1

## 2018-06-16 MED ORDER — ACETAMINOPHEN 10 MG/ML IV SOLN
INTRAVENOUS | Status: DC | PRN
  Administered 2018-06-16: 1000 mg via INTRAVENOUS

## 2018-06-16 MED ORDER — GLYCOPYRROLATE 0.2 MG/ML IJ SOLN
INTRAMUSCULAR | Status: DC | PRN
  Administered 2018-06-16: 0.2 mg via INTRAVENOUS

## 2018-06-16 MED ORDER — PHENYLEPHRINE HCL 10 MG/ML IJ SOLN
INTRAMUSCULAR | Status: DC | PRN
  Administered 2018-06-16: 100 ug via INTRAVENOUS
  Administered 2018-06-16: 200 ug via INTRAVENOUS
  Administered 2018-06-16 (×2): 100 ug via INTRAVENOUS
  Administered 2018-06-16: 200 ug via INTRAVENOUS

## 2018-06-16 MED ORDER — METHYLENE BLUE 0.5 % INJ SOLN
INTRAVENOUS | Status: DC | PRN
  Administered 2018-06-16: 5 mL via SUBMUCOSAL

## 2018-06-16 MED ORDER — TECHNETIUM TC 99M SULFUR COLLOID FILTERED
0.7270 | Freq: Once | INTRAVENOUS | Status: AC | PRN
  Administered 2018-06-16: 0.727 via INTRADERMAL

## 2018-06-16 MED ORDER — ONDANSETRON HCL 4 MG/2ML IJ SOLN
INTRAMUSCULAR | Status: AC
  Filled 2018-06-16: qty 2

## 2018-06-16 MED ORDER — ROCURONIUM BROMIDE 50 MG/5ML IV SOLN
INTRAVENOUS | Status: AC
  Filled 2018-06-16: qty 1

## 2018-06-16 MED ORDER — FAMOTIDINE 20 MG PO TABS
20.0000 mg | ORAL_TABLET | Freq: Once | ORAL | Status: AC
  Administered 2018-06-16: 20 mg via ORAL

## 2018-06-16 MED ORDER — FENTANYL CITRATE (PF) 100 MCG/2ML IJ SOLN
25.0000 ug | INTRAMUSCULAR | Status: DC | PRN
  Administered 2018-06-16 (×3): 25 ug via INTRAVENOUS

## 2018-06-16 MED ORDER — FENTANYL CITRATE (PF) 100 MCG/2ML IJ SOLN
INTRAMUSCULAR | Status: AC
  Administered 2018-06-16: 25 ug via INTRAVENOUS
  Filled 2018-06-16: qty 2

## 2018-06-16 MED ORDER — METHYLENE BLUE 0.5 % INJ SOLN
INTRAVENOUS | Status: AC
  Filled 2018-06-16: qty 10

## 2018-06-16 MED ORDER — FENTANYL CITRATE (PF) 100 MCG/2ML IJ SOLN
INTRAMUSCULAR | Status: DC | PRN
  Administered 2018-06-16 (×2): 50 ug via INTRAVENOUS

## 2018-06-16 MED ORDER — ONDANSETRON HCL 4 MG/2ML IJ SOLN
INTRAMUSCULAR | Status: DC | PRN
  Administered 2018-06-16: 4 mg via INTRAVENOUS

## 2018-06-16 MED ORDER — DEXAMETHASONE SODIUM PHOSPHATE 10 MG/ML IJ SOLN
INTRAMUSCULAR | Status: DC | PRN
  Administered 2018-06-16: 10 mg via INTRAVENOUS

## 2018-06-16 MED ORDER — BUPIVACAINE-EPINEPHRINE (PF) 0.5% -1:200000 IJ SOLN
INTRAMUSCULAR | Status: DC | PRN
  Administered 2018-06-16: 10 mL
  Administered 2018-06-16: 20 mL

## 2018-06-16 MED ORDER — DEXAMETHASONE SODIUM PHOSPHATE 10 MG/ML IJ SOLN
INTRAMUSCULAR | Status: AC
  Filled 2018-06-16: qty 1

## 2018-06-16 MED ORDER — FENTANYL CITRATE (PF) 100 MCG/2ML IJ SOLN
INTRAMUSCULAR | Status: AC
  Filled 2018-06-16: qty 2

## 2018-06-16 MED ORDER — LACTATED RINGERS IV SOLN
INTRAVENOUS | Status: DC
  Administered 2018-06-16: 11:00:00 via INTRAVENOUS

## 2018-06-16 MED ORDER — PROPOFOL 10 MG/ML IV BOLUS
INTRAVENOUS | Status: AC
  Filled 2018-06-16: qty 20

## 2018-06-16 MED ORDER — IPRATROPIUM-ALBUTEROL 0.5-2.5 (3) MG/3ML IN SOLN
RESPIRATORY_TRACT | Status: AC
  Administered 2018-06-16: 3 mL via RESPIRATORY_TRACT
  Filled 2018-06-16: qty 3

## 2018-06-16 MED ORDER — EPHEDRINE SULFATE 50 MG/ML IJ SOLN
INTRAMUSCULAR | Status: DC | PRN
  Administered 2018-06-16: 10 mg via INTRAVENOUS

## 2018-06-16 MED ORDER — PROPOFOL 10 MG/ML IV BOLUS
INTRAVENOUS | Status: DC | PRN
  Administered 2018-06-16: 40 mg via INTRAVENOUS
  Administered 2018-06-16: 160 mg via INTRAVENOUS

## 2018-06-16 MED ORDER — SEVOFLURANE IN SOLN
RESPIRATORY_TRACT | Status: AC
  Filled 2018-06-16: qty 250

## 2018-06-16 MED ORDER — HYDROCODONE-ACETAMINOPHEN 5-325 MG PO TABS: 1.0000 | ORAL_TABLET | ORAL | 0 refills | Status: DC | PRN

## 2018-06-16 MED ORDER — LIDOCAINE HCL (PF) 2 % IJ SOLN
INTRAMUSCULAR | Status: AC
  Filled 2018-06-16: qty 10

## 2018-06-16 MED ORDER — FAMOTIDINE 20 MG PO TABS
ORAL_TABLET | ORAL | Status: AC
  Administered 2018-06-16: 20 mg via ORAL
  Filled 2018-06-16: qty 1

## 2018-06-16 MED ORDER — BUPIVACAINE-EPINEPHRINE (PF) 0.5% -1:200000 IJ SOLN
INTRAMUSCULAR | Status: AC
  Filled 2018-06-16: qty 30

## 2018-06-16 MED ORDER — ACETAMINOPHEN 10 MG/ML IV SOLN
INTRAVENOUS | Status: AC
  Filled 2018-06-16: qty 100

## 2018-06-16 MED ORDER — MIDAZOLAM HCL 2 MG/2ML IJ SOLN
INTRAMUSCULAR | Status: AC
  Filled 2018-06-16: qty 2

## 2018-06-16 MED ORDER — LIDOCAINE HCL (CARDIAC) PF 100 MG/5ML IV SOSY
PREFILLED_SYRINGE | INTRAVENOUS | Status: DC | PRN
  Administered 2018-06-16: 80 mg via INTRAVENOUS

## 2018-06-16 MED ORDER — IPRATROPIUM-ALBUTEROL 0.5-2.5 (3) MG/3ML IN SOLN
3.0000 mL | RESPIRATORY_TRACT | Status: DC
  Administered 2018-06-16: 3 mL via RESPIRATORY_TRACT

## 2018-06-16 MED ORDER — ONDANSETRON HCL 4 MG/2ML IJ SOLN: 4.0000 mg | Freq: Once | INTRAMUSCULAR | Status: DC | PRN

## 2018-06-16 SURGICAL SUPPLY — 54 items
BINDER BREAST LRG (GAUZE/BANDAGES/DRESSINGS) ×3 IMPLANT
BINDER BREAST MEDIUM (GAUZE/BANDAGES/DRESSINGS) IMPLANT
BINDER BREAST XLRG (GAUZE/BANDAGES/DRESSINGS) IMPLANT
BINDER BREAST XXLRG (GAUZE/BANDAGES/DRESSINGS) IMPLANT
BLADE SURG 15 STRL SS SAFETY (BLADE) ×6 IMPLANT
BULB RESERV EVAC DRAIN JP 100C (MISCELLANEOUS) IMPLANT
CANISTER SUCT 1200ML W/VALVE (MISCELLANEOUS) ×3 IMPLANT
CHLORAPREP W/TINT 26ML (MISCELLANEOUS) ×3 IMPLANT
CLOSURE WOUND 1/2 X4 (GAUZE/BANDAGES/DRESSINGS) ×1
CNTNR SPEC 2.5X3XGRAD LEK (MISCELLANEOUS)
CONT SPEC 4OZ STER OR WHT (MISCELLANEOUS)
CONTAINER SPEC 2.5X3XGRAD LEK (MISCELLANEOUS) IMPLANT
COVER PROBE FLX POLY STRL (MISCELLANEOUS) ×3 IMPLANT
DEVICE DUBIN SPECIMEN MAMMOGRA (MISCELLANEOUS) ×3 IMPLANT
DRAIN CHANNEL JP 15F RND 16 (MISCELLANEOUS) IMPLANT
DRAPE LAPAROTOMY TRNSV 106X77 (MISCELLANEOUS) ×3 IMPLANT
DRSG GAUZE FLUFF 36X18 (GAUZE/BANDAGES/DRESSINGS) ×6 IMPLANT
DRSG TELFA 3X8 NADH (GAUZE/BANDAGES/DRESSINGS) ×3 IMPLANT
ELECT CAUTERY BLADE TIP 2.5 (TIP) ×3
ELECT REM PT RETURN 9FT ADLT (ELECTROSURGICAL) ×3
ELECTRODE CAUTERY BLDE TIP 2.5 (TIP) ×1 IMPLANT
ELECTRODE REM PT RTRN 9FT ADLT (ELECTROSURGICAL) ×1 IMPLANT
GAUZE SPONGE 4X4 12PLY STRL (GAUZE/BANDAGES/DRESSINGS) ×3 IMPLANT
GLOVE BIO SURGEON STRL SZ7.5 (GLOVE) ×3 IMPLANT
GLOVE INDICATOR 8.0 STRL GRN (GLOVE) ×3 IMPLANT
GOWN STRL REUS W/ TWL LRG LVL3 (GOWN DISPOSABLE) ×2 IMPLANT
GOWN STRL REUS W/TWL LRG LVL3 (GOWN DISPOSABLE) ×4
KIT TURNOVER KIT A (KITS) ×3 IMPLANT
LABEL OR SOLS (LABEL) ×3 IMPLANT
MARGIN MAP 10MM (MISCELLANEOUS) ×3 IMPLANT
NEEDLE HYPO 22GX1.5 SAFETY (NEEDLE) ×3 IMPLANT
NEEDLE HYPO 25X1 1.5 SAFETY (NEEDLE) ×6 IMPLANT
PACK BASIN MINOR ARMC (MISCELLANEOUS) ×3 IMPLANT
RETRACTOR RING XSMALL (MISCELLANEOUS) ×1 IMPLANT
RTRCTR WOUND ALEXIS 13CM XS SH (MISCELLANEOUS) ×3
SHEARS FOC LG CVD HARMONIC 17C (MISCELLANEOUS) IMPLANT
SHEARS HARMONIC 9CM CVD (BLADE) ×3 IMPLANT
SLEVE PROBE SENORX GAMMA FIND (MISCELLANEOUS) ×3 IMPLANT
STRIP CLOSURE SKIN 1/2X4 (GAUZE/BANDAGES/DRESSINGS) ×2 IMPLANT
SUT ETHILON 3-0 FS-10 30 BLK (SUTURE) ×3
SUT SILK 2 0 (SUTURE) ×2
SUT SILK 2-0 18XBRD TIE 12 (SUTURE) ×1 IMPLANT
SUT VIC AB 2-0 CT1 27 (SUTURE) ×6
SUT VIC AB 2-0 CT1 TAPERPNT 27 (SUTURE) ×3 IMPLANT
SUT VIC AB 3-0 SH 27 (SUTURE) ×4
SUT VIC AB 3-0 SH 27X BRD (SUTURE) ×2 IMPLANT
SUT VIC AB 4-0 FS2 27 (SUTURE) ×9 IMPLANT
SUT VICRYL+ 3-0 144IN (SUTURE) ×6 IMPLANT
SUTURE EHLN 3-0 FS-10 30 BLK (SUTURE) ×1 IMPLANT
SWABSTK COMLB BENZOIN TINCTURE (MISCELLANEOUS) ×3 IMPLANT
SYR 10ML LL (SYRINGE) ×3 IMPLANT
SYR BULB IRRIG 60ML STRL (SYRINGE) ×3 IMPLANT
TAPE TRANSPORE STRL 2 31045 (GAUZE/BANDAGES/DRESSINGS) ×3 IMPLANT
WATER STERILE IRR 1000ML POUR (IV SOLUTION) ×3 IMPLANT

## 2018-06-16 NOTE — Anesthesia Preprocedure Evaluation (Signed)
Anesthesia Evaluation  Patient identified by MRN, date of birth, ID band Patient awake    Reviewed: Allergy & Precautions, NPO status , Patient's Chart, lab work & pertinent test results  History of Anesthesia Complications Negative for: history of anesthetic complications  Airway Mallampati: III       Dental   Pulmonary neg sleep apnea, COPD,  COPD inhaler, former smoker,           Cardiovascular hypertension, Pt. on medications      Neuro/Psych Anxiety Depression    GI/Hepatic Neg liver ROS, neg GERD  ,  Endo/Other  neg diabetes  Renal/GU negative Renal ROS     Musculoskeletal   Abdominal   Peds  Hematology   Anesthesia Other Findings   Reproductive/Obstetrics                             Anesthesia Physical Anesthesia Plan  ASA: II  Anesthesia Plan:    Post-op Pain Management:    Induction:   PONV Risk Score and Plan: 2  Airway Management Planned: LMA  Additional Equipment:   Intra-op Plan:   Post-operative Plan:   Informed Consent: I have reviewed the patients History and Physical, chart, labs and discussed the procedure including the risks, benefits and alternatives for the proposed anesthesia with the patient or authorized representative who has indicated his/her understanding and acceptance.     Plan Discussed with:   Anesthesia Plan Comments:         Anesthesia Quick Evaluation

## 2018-06-16 NOTE — Anesthesia Procedure Notes (Signed)
Procedure Name: LMA Insertion Date/Time: 06/16/2018 11:17 AM Performed by: Doreen Salvage, CRNA Pre-anesthesia Checklist: Patient identified, Patient being monitored, Timeout performed, Emergency Drugs available and Suction available Patient Re-evaluated:Patient Re-evaluated prior to induction Oxygen Delivery Method: Circle system utilized Preoxygenation: Pre-oxygenation with 100% oxygen Induction Type: IV induction Ventilation: Mask ventilation without difficulty LMA: LMA inserted LMA Size: 3.5 Tube type: Oral Number of attempts: 1 Placement Confirmation: positive ETCO2 and breath sounds checked- equal and bilateral Tube secured with: Tape Dental Injury: Teeth and Oropharynx as per pre-operative assessment

## 2018-06-16 NOTE — Anesthesia Post-op Follow-up Note (Signed)
Anesthesia QCDR form completed.        

## 2018-06-16 NOTE — CV Procedure (Signed)
Preoperative diagnosis: Breast cancer, right upper inner quadrant.  Postoperative diagnosis: Same.  Operative procedure Wide local excision, sentinel node biopsy.  Operating Surgeon: Hervey Ard, MD.  Anesthesia: General by LMA, Marcaine 0.5% with 1 to 200,000 units of epinephrine, 30 cc.  Estimated blood loss: 5 cc.  Clinical note: This 72 year old woman had an abnormal mammogram with a foci of microcalcifications in the upper inner quadrant.  She subsequently underwent stereotactic biopsy with identification of a 4 mm invasive mammary carcinoma.  Original area of calcifications was reported at 9 mm.  She was felt to be a candidate for breast conservation.  The patient underwent injection with technetium sulfur colloid prior to the procedure.  Operative note: With the patient under adequate general anesthesia the periareolar tissue was cleansed with alcohol and 5 cc of 0.5% methylene blue was instilled in the subareolar plexus.  The breast chest and axilla was then cleansed with ChloraPrep and draped.  Ultrasound was used to confirm location of the biopsy site in the upper inner quadrant of the breast, 1:00, 5 cm from the nipple.  Local anesthesia was infiltrated for postoperative comfort.  A curvilinear incision in the upper inner quadrant was made carried down through skin subtendinous tissue.  An Alexis wound protector was placed and a block of tissue measuring 3 x 3 x 4 cm was excised, orientated and specimen radiograph confirmed the clip in the center portion of the specimen.  Subsequent review by pathology showed the margins clear.  While the breast specimen was being processed attention was turned to the axilla.  The node seeker device was used to identify an area of increased uptake in the lower aspect of the axilla.  Local anesthesia was infiltrated.  A transverse incision was made carried down through skin and subtendinous tissue.  The wound protector was moved to this area.  The  axillary envelope was opened in a blue lymphatic followed to a 8 mm hot, blue node with counts of 2000.  2 smaller nodes with counts of 305 100 was subsequently identified just deep to this area.  Scanning through the remaining area of the axilla showed no evidence of increased uptake.  Hemostasis was with 3-0 Vicryl ties and electrocautery.  The axillary envelope was closed with a 2-0 Vicryl figure-of-eight suture.  The adipose layer was closed in a similar fashion.  The skin was closed with a running 4-0 Vicryl sub-particular suture.  Attention was turned towards closure of the defect in the upper inner quadrant of the right breast.  A single 2-0 Vicryl figure-of-eight suture was used at the base the wound.  The deep adipose layer was then approximated with interrupted 2-0 Vicryl sutures.  The skin was closed with a running 4-0 Vicryl subcuticular suture.  Benzoin Steri-Strips followed by a Telfa dressing, fluff gauze and a compressive wrap was applied.  The patient tolerated the procedure well was taken to recovery room in stable condition.

## 2018-06-16 NOTE — Progress Notes (Signed)
Pt states  Will opt to take pain med at home

## 2018-06-16 NOTE — Anesthesia Postprocedure Evaluation (Signed)
Anesthesia Post Note  Patient: Tammy Choi  Procedure(s) Performed: BREAST LUMPECTOMY WITH SENTINEL LYMPH NODE BX (Right Breast)  Patient location during evaluation: PACU Anesthesia Type: General Level of consciousness: awake and alert Pain management: pain level controlled Vital Signs Assessment: post-procedure vital signs reviewed and stable Respiratory status: spontaneous breathing and respiratory function stable Cardiovascular status: stable Anesthetic complications: no     Last Vitals:  Vitals:   06/16/18 1310 06/16/18 1321  BP:  (!) 147/72  Pulse: 91 98  Resp: 17 15  Temp:  37 C  SpO2: 94% 92%    Last Pain:  Vitals:   06/16/18 1321  TempSrc:   PainSc: 2                  KEPHART,WILLIAM K

## 2018-06-16 NOTE — H&P (Signed)
No change in clinical history or exam.  Moderate discomfort during sentinel node injection in spite of the use of EMLA cream.  For right breast wide excision and sentinel node biopsy.

## 2018-06-16 NOTE — Progress Notes (Signed)
Dr Bary Castilla called and pt to be discharged

## 2018-06-16 NOTE — Transfer of Care (Signed)
Immediate Anesthesia Transfer of Care Note  Patient: Tammy Choi  Procedure(s) Performed: Procedure(s): BREAST LUMPECTOMY WITH SENTINEL LYMPH NODE BX (Right)  Patient Location: PACU  Anesthesia Type:General  Level of Consciousness: sedated  Airway & Oxygen Therapy: Patient Spontanous Breathing and Patient connected to face mask oxygen  Post-op Assessment: Report given to RN and Post -op Vital signs reviewed and stable  Post vital signs: Reviewed and stable  Last Vitals:  Vitals:   06/16/18 1038 06/16/18 1236  BP: (!) 154/91 125/64  Pulse: 76 81  Resp: 18 16  Temp: 36.7 C 36.8 C  SpO2: 57% 262%    Complications: No apparent anesthesia complications

## 2018-06-17 ENCOUNTER — Encounter: Payer: Self-pay | Admitting: General Surgery

## 2018-06-17 LAB — SURGICAL PATHOLOGY

## 2018-06-17 NOTE — Telephone Encounter (Signed)
Last OV 03/14/2018   prozac last refilled 03/05/2018 disp 60 with no refills   crestor last refilled 03/06/2018    Sent to PCP please advise refill for prozac

## 2018-06-17 NOTE — Telephone Encounter (Signed)
Call pt to schedule for an appt

## 2018-06-20 MED ORDER — PROPOFOL 10 MG/ML IV BOLUS
INTRAVENOUS | Status: AC
  Filled 2018-06-20: qty 20

## 2018-06-24 ENCOUNTER — Ambulatory Visit (INDEPENDENT_AMBULATORY_CARE_PROVIDER_SITE_OTHER): Payer: PPO | Admitting: *Deleted

## 2018-06-24 DIAGNOSIS — Z170 Estrogen receptor positive status [ER+]: Secondary | ICD-10-CM

## 2018-06-24 DIAGNOSIS — C50311 Malignant neoplasm of lower-inner quadrant of right female breast: Secondary | ICD-10-CM

## 2018-06-24 NOTE — Progress Notes (Signed)
Patient ID: Tammy Choi, female   DOB: Jan 28, 1946, 72 y.o.   MRN: [id number]   Patient came in today for a wound check post right breast lumpectomy.  The incisions are clean, with no signs of infection noted. Steri strips intact. Breast remains tender with bruising. Encouraged to wear comfortable bra. The patient is aware to use a heating pad as needed for comfort. Follow up as scheduled.

## 2018-06-24 NOTE — Patient Instructions (Signed)
Encouraged to wear comfortable bra. The patient is aware to use a heating pad as needed for comfort.

## 2018-06-30 ENCOUNTER — Ambulatory Visit (INDEPENDENT_AMBULATORY_CARE_PROVIDER_SITE_OTHER): Payer: PPO | Admitting: Family Medicine

## 2018-06-30 ENCOUNTER — Encounter: Payer: Self-pay | Admitting: Family Medicine

## 2018-06-30 VITALS — BP 152/90 | HR 78 | Temp 98.2°F | Ht 62.0 in | Wt 191.2 lb

## 2018-06-30 DIAGNOSIS — C50311 Malignant neoplasm of lower-inner quadrant of right female breast: Secondary | ICD-10-CM

## 2018-06-30 DIAGNOSIS — Z170 Estrogen receptor positive status [ER+]: Secondary | ICD-10-CM

## 2018-06-30 DIAGNOSIS — J449 Chronic obstructive pulmonary disease, unspecified: Secondary | ICD-10-CM

## 2018-06-30 DIAGNOSIS — Z23 Encounter for immunization: Secondary | ICD-10-CM

## 2018-06-30 DIAGNOSIS — E785 Hyperlipidemia, unspecified: Secondary | ICD-10-CM

## 2018-06-30 DIAGNOSIS — I10 Essential (primary) hypertension: Secondary | ICD-10-CM

## 2018-06-30 MED ORDER — DOXYCYCLINE HYCLATE 100 MG PO TABS: 100.0000 mg | ORAL_TABLET | Freq: Two times a day (BID) | ORAL | 0 refills | Status: DC

## 2018-06-30 NOTE — Progress Notes (Signed)
[name], MD Phone: [phone]  Tammy Choi is a 72 y.o. female who presents today for f/u.  CC: htn, breast cancer, HLD, copd  HYPERTENSION  Disease Monitoring  Home BP Monitoring not checking at home, though reports BP has been well controlled at other offices Chest pain- no    Dyspnea- no Medications  Compliance-  Taking lisinopril.  Edema- no  Breast cancer: Patient underwent lumpectomy with sentinel node biopsy for lower inner quadrant right breast cancer.  Estrogen receptor positive.  She follows up with her general surgeon tomorrow.  She reports some discomfort at the incision sites.   Notes the lymph node incision site healed up well.  On exam there is erythema extending out from the incision site.  The patient reports this has been present over the last week or so at least.  She thought it may have just been covered up previously by bruising.  She notes it has not been spreading.  Hyperlipidemia: Taking Crestor.  No right upper quadrant pain or myalgias.  Next  COPD: She has mild intermittent cough and wheezing that is stable compared to prior.  She is using her inhalers which are beneficial.  No shortness of breath.    Social History   Tobacco Use  Smoking Status Former Smoker  . Packs/day: 1.00  . Years: 35.00  . Pack years: 35.00  . Types: Cigarettes  . Last attempt to quit: 12/20/2015  . Years since quitting: 2.5  Smokeless Tobacco Never Used     ROS see history of present illness  Objective  Physical Exam Vitals:   06/30/18 1544  BP: (!) 152/90  Pulse: 78  Temp: 98.2 F (36.8 C)  SpO2: 96%    BP Readings from Last 3 Encounters:  06/30/18 (!) 152/90  06/16/18 122/83  06/12/18 128/72   Wt Readings from Last 3 Encounters:  06/30/18 191 lb 3.2 oz (86.7 kg)  06/16/18 187 lb (84.8 kg)  06/12/18 187 lb (84.8 kg)    Physical Exam  Constitutional: No distress.  Cardiovascular: Normal rate, regular rhythm and normal heart sounds.    Pulmonary/Chest: Effort normal and breath sounds normal.    Musculoskeletal: She exhibits no edema.  Neurological: She is alert.  Skin: Skin is warm and dry. She is not diaphoretic.     Assessment/Plan: Please see individual problem list.  Hypertension Above goal today.  She will return for BP check in 1 week.  Hyperlipidemia Continue Crestor.  COPD (chronic obstructive pulmonary disease) ([facility]) Symptoms have improved from her last visit.  Seem to be relatively well controlled.  She will continue her current regimen.  Malignant neoplasm of lower-inner quadrant of right breast of female, estrogen receptor positive ([facility]) Recent lumpectomy.  There is erythema surrounding the surgical incision site.  There is no fluctuance or drainage to indicate abscess formation at this time.  Concern would be for infection though the lack of significant spread could indicate another cause.  Given concern for infection we will place her on doxycycline given her severe penicillin allergy.  She has an appointment with her surgeon tomorrow and she will keep that appointment.  She is given return precautions.   No orders of the defined types were placed in this encounter.   Meds ordered this encounter  Medications  . doxycycline (VIBRA-TABS) 100 MG tablet    Sig: Take 1 tablet (100 mg total) by mouth 2 (two) times daily.    Dispense:  14 tablet    Refill:  0  [name], MD [place]

## 2018-06-30 NOTE — Assessment & Plan Note (Signed)
Symptoms have improved from her last visit.  Seem to be relatively well controlled.  She will continue her current regimen.

## 2018-06-30 NOTE — Assessment & Plan Note (Signed)
Above goal today.  She will return for BP check in 1 week.

## 2018-06-30 NOTE — Assessment & Plan Note (Signed)
Continue Crestor 

## 2018-06-30 NOTE — Patient Instructions (Addendum)
Nice to see you. We will place you on an antibiotic to take for possible infection. Please keep your appointment with Dr Bary Castilla tomorrow.  If you develop spreading redness, increasing pain, swelling, or develop fevers or any new symptoms please go to the emergency room.

## 2018-06-30 NOTE — Assessment & Plan Note (Signed)
Recent lumpectomy.  There is erythema surrounding the surgical incision site.  There is no fluctuance or drainage to indicate abscess formation at this time.  Concern would be for infection though the lack of significant spread could indicate another cause.  Given concern for infection we will place her on doxycycline given her severe penicillin allergy.  She has an appointment with her surgeon tomorrow and she will keep that appointment.  She is given return precautions.

## 2018-07-01 ENCOUNTER — Ambulatory Visit: Payer: PPO | Admitting: General Surgery

## 2018-07-01 ENCOUNTER — Encounter: Payer: Self-pay | Admitting: General Surgery

## 2018-07-01 ENCOUNTER — Ambulatory Visit (INDEPENDENT_AMBULATORY_CARE_PROVIDER_SITE_OTHER): Payer: PPO

## 2018-07-01 VITALS — BP 138/68 | HR 82 | Temp 97.9°F | Resp 18 | Ht 62.0 in | Wt 190.0 lb

## 2018-07-01 DIAGNOSIS — Z170 Estrogen receptor positive status [ER+]: Principal | ICD-10-CM

## 2018-07-01 DIAGNOSIS — C50311 Malignant neoplasm of lower-inner quadrant of right female breast: Secondary | ICD-10-CM

## 2018-07-01 NOTE — Progress Notes (Signed)
Patient ID: Tammy Choi, female   DOB: 12/21/1945, 72 y.o.   MRN: [id number]  Chief Complaint  Patient presents with  . Routine Post Op    HPI Tammy Choi is a 72 y.o. female. Here for postoperative visit, right lumpectomy and SN biopsy on 06-16-18.  She admits to a sharp shooting pain that goes down to the nipple, the pain comes and goes briefly She states Dr Josephina Gip placed her on doxycycline yesterday because the incision looked red.  She is here with her daughter, Tammy Choi.  HPI  Past Medical History:  Diagnosis Date  . Breast cancer ([place]) 05/07/2018  . COPD (chronic obstructive pulmonary disease) (HCC)    Not on home o2  . Depression   . HTN (hypertension)   . Hyperlipemia     Past Surgical History:  Procedure Laterality Date  . ABDOMINAL HYSTERECTOMY    . BREAST BIOPSY Left 04/30/2013   neg core  . BREAST BIOPSY Right 05/07/2018   right breast stereo x clip INVASIVE MAMMARY CARCINOMA  . BREAST LUMPECTOMY WITH SENTINEL LYMPH NODE BIOPSY Right 06/16/2018   Procedure: BREAST LUMPECTOMY WITH SENTINEL LYMPH NODE BX;  Surgeon: Robert Bellow, MD;  Location: [facility] ORS;  Service: General;  Laterality: Right;  . COLONOSCOPY  2014   Dr Bary Castilla  . Left Leg Surgery    . TONSILLECTOMY      Family History  Problem Relation Age of Onset  . Kidney disease Mother        deceased 44  . Heart disease Father        deceased 69  . Breast cancer Other 59       maternal half-sister; deceased 75  . Colon cancer Neg Hx     Social History Social History   Tobacco Use  . Smoking status: Former Smoker    Packs/day: 1.00    Years: 35.00    Pack years: 35.00    Types: Cigarettes    Last attempt to quit: 12/20/2015    Years since quitting: 2.5  . Smokeless tobacco: Never Used  Substance Use Topics  . Alcohol use: No    Alcohol/week: 0.0 standard drinks  . Drug use: No    Allergies  Allergen Reactions  . Penicillins Anaphylaxis and Other (See Comments)   Has patient had a PCN reaction causing immediate rash, facial/tongue/throat swelling, SOB or lightheadedness with hypotension: Yes Has patient had a PCN reaction causing severe rash involving mucus membranes or skin necrosis: No Has patient had a PCN reaction that required hospitalization No Has patient had a PCN reaction occurring within the last 10 years: No If all of the above answers are "NO", then may proceed with Cephalosporin use.    Current Outpatient Medications  Medication Sig Dispense Refill  . acetaminophen (TYLENOL) 500 MG tablet Take 500-1,000 mg by mouth 2 (two) times daily as needed for moderate pain or headache.    . albuterol (PROAIR HFA) 108 (90 Base) MCG/ACT inhaler Inhale 2 puffs into the lungs every 6 (six) hours as needed for wheezing or shortness of breath. 1 Inhaler 2  . budesonide-formoterol (SYMBICORT) 160-4.5 MCG/ACT inhaler Inhale 2 puffs into the lungs 2 (two) times daily. 1 Inhaler 5  . cholecalciferol (VITAMIN D) 1000 UNITS tablet Take 2,000 Units by mouth every other day.     Marland Kitchen doxycycline (VIBRA-TABS) 100 MG tablet Take 1 tablet (100 mg total) by mouth 2 (two) times daily. 14 tablet 0  . FLUoxetine (PROZAC) 40 MG capsule  Take 1 capsule (40 mg total) by mouth at bedtime. 90 capsule 3  . lisinopril (PRINIVIL,ZESTRIL) 40 MG tablet TAKE 1 TABLET BY MOUTH ONCE DAILY (Patient taking differently: Take 40 mg by mouth at bedtime. ) 90 tablet 3  . Multiple Vitamins-Minerals (MULTIVITAMIN WITH MINERALS) tablet Take 1 tablet by mouth every other day.     . rosuvastatin (CRESTOR) 5 MG tablet TAKE 1 TABLET BY MOUTH ONCE DAILY 90 tablet 3   No current facility-administered medications for this visit.     Review of Systems Review of Systems  Blood pressure 138/68, pulse 82, temperature 97.9 F (36.6 C), temperature source Skin, resp. rate 18, height 5' 2"  (1.575 m), weight 190 lb (86.2 kg), SpO2 94 %.  Physical Exam Physical Exam  Constitutional: She is oriented to  person, place, and time. She appears well-developed and well-nourished.  Pulmonary/Chest:  Right lumpectomy site intact    Neurological: She is alert and oriented to person, place, and time.  Skin: Skin is warm and dry.  Psychiatric: Her behavior is normal.    Data Reviewed A. BREAST, RIGHT UPPER INNER QUADRANT; WIDE EXCISION:  - RESIDUAL INVASIVE MAMMARY CARCINOMA.  - RESIDUAL DUCTAL CARCINOMA IN SITU.  - SEE CANCER SUMMARY BELOW.  - BIOPSY SITE CHANGE AND METALLIC CLIP.  - INCIDENTAL 5 MM FIBROADENOMA.   B. SENTINEL LYMPH NODE, RIGHT 1, 2, AND 3; EXCISION:  - THREE LYMPH NODES NEGATIVE FOR MALIGNANCY (0/3).   CANCER CASE SUMMARY: INVASIVE CARCINOMA OF THE BREAST  Procedure: Wide excision  Specimen Laterality: Right  Tumor Size: 4 mm  Histologic Type: Invasive mammary carcinoma, no special type  Histologic Grade (Nottingham Histologic Score)  Glandular (Acinar)/Tubular Differentiation: 3       Nuclear Pleomorphism: 2       Mitotic Rate: 1       Overall Grade: 2  Ductal Carcinoma In Situ (DCIS): Present  Nuclear grade 2, without comedonecrosis  Positive for extensive intraductal component (EIC)  Margins:  Invasive Carcinoma Margins: Uninvolved by invasive carcinoma            Distance from closest margin: 8 mm            Specify closest margin: Anterior       DCIS Margins: Uninvolved by DCIS            Distance from closest margin: 3 mm            Specify closest margin: Medial  Regional Lymph Nodes: Uninvolved by tumor cells  Number of Lymph Nodes Examined: 3  Number of Sentinel Nodes Examined: 3  Lymphovascular Invasion: Not identified  Pathologic Stage Classification (pTNM, AJCC 8th Edition): pT1a pN0 (sn)   Breast biomarkers were performed on the prior biopsy ([id number]), in  summary:  Estrogen Receptor (ER) Status: POSITIVE  Progesterone Receptor (PgR) Status: NEGATIVE  HER2 (by  immunohistochemistry):NEGATIVE   Ultrasound examination was undertaken to determine if the patient would be a candidate for accelerated partial breast radiation.  There is a well-defined post excision cavity approximately 2.45 cm below the skin measuring 2.2 x 2.6 x 3.8 cm.  Adequate spacing for MammoSite balloon placement.   Assessment    Doing well post right breast wide excision and sentinel node biopsy for a T1 a, N0 cancer.  Slight inferior lateral breast skin erythema, no evidence of deep infection    Plan        Options for radiation therapy were reviewed including partial breast radiation and whole breast radiation.  Methodology for accelerated partial breast radiation reviewed.  Final decision will be up to Dr. Baruch Gouty.  Appointment with Dr Baruch Gouty for radiation therapy The patient is aware to call back for any questions or new concerns.  The patient is aware to use a heating pad as needed for comfort. Recommend wearing her bra for support.  HPI, Physical Exam, Assessment and Plan have been scribed under the direction and in the presence of Robert Bellow, MD. Karie Fetch, RN  I have completed the exam and reviewed the above documentation for accuracy and completeness.  I agree with the above.  Haematologist has been used and any errors in dictation or transcription are unintentional.  Hervey Ard, M.D., F.A.C.S.  [name] Akiera Allbaugh 07/02/2018, 8:59 PM

## 2018-07-01 NOTE — Patient Instructions (Addendum)
The patient is aware to use a heating pad as needed for comfort. The patient is aware to call back for any questions or concerns.  Appointment with Dr Baruch Gouty, they will call the patient to schedule this.

## 2018-07-02 ENCOUNTER — Encounter: Payer: Self-pay | Admitting: General Surgery

## 2018-07-03 ENCOUNTER — Ambulatory Visit: Payer: PPO | Admitting: Radiation Oncology

## 2018-07-03 ENCOUNTER — Other Ambulatory Visit: Payer: Self-pay | Admitting: Oncology

## 2018-07-10 ENCOUNTER — Ambulatory Visit (INDEPENDENT_AMBULATORY_CARE_PROVIDER_SITE_OTHER): Payer: PPO

## 2018-07-10 DIAGNOSIS — Z0130 Encounter for examination of blood pressure without abnormal findings: Secondary | ICD-10-CM

## 2018-07-10 DIAGNOSIS — Z23 Encounter for immunization: Secondary | ICD-10-CM

## 2018-07-10 NOTE — Progress Notes (Signed)
Notified patient of Dr. Tharon Aquas recommendations. Patient states that she is willing to try the hydrochlorothiazide.

## 2018-07-10 NOTE — Progress Notes (Signed)
Pt is here today for BP check and flu shot.   Pt is only to have her BP taken out of her left arm due to breast cancer.   No changes in pt's current list of medication. Pt is using the same pharmacy [facility].   High flu dose given in LD pt tolerated well.   LA:BP: 150/98, O2:95, P:85  LA 2nd try: BP:152/92  Pt would like to know if we could increase the dose of her Lisinopril.   Pt stated that she does not want to take whatever was Rx'd to her in the past for her BP to take along with Lisinopril bc it caused swelling of her feet and legs. FYI  [phone]

## 2018-07-11 ENCOUNTER — Other Ambulatory Visit: Payer: Self-pay | Admitting: *Deleted

## 2018-07-11 ENCOUNTER — Telehealth: Payer: Self-pay | Admitting: Family Medicine

## 2018-07-11 ENCOUNTER — Encounter: Payer: Self-pay | Admitting: Radiation Oncology

## 2018-07-11 ENCOUNTER — Other Ambulatory Visit: Payer: Self-pay

## 2018-07-11 ENCOUNTER — Ambulatory Visit
Admission: RE | Admit: 2018-07-11 | Discharge: 2018-07-11 | Disposition: A | Discharge status: Home / Self Care | Payer: PPO | Source: Ambulatory Visit | Attending: Radiation Oncology | Admitting: Radiation Oncology

## 2018-07-11 DIAGNOSIS — F329 Major depressive disorder, single episode, unspecified: Secondary | ICD-10-CM | POA: Insufficient documentation

## 2018-07-11 DIAGNOSIS — C50311 Malignant neoplasm of lower-inner quadrant of right female breast: Secondary | ICD-10-CM | POA: Insufficient documentation

## 2018-07-11 DIAGNOSIS — N61 Mastitis without abscess: Secondary | ICD-10-CM | POA: Insufficient documentation

## 2018-07-11 DIAGNOSIS — Z79899 Other long term (current) drug therapy: Secondary | ICD-10-CM | POA: Diagnosis not present

## 2018-07-11 DIAGNOSIS — Z170 Estrogen receptor positive status [ER+]: Secondary | ICD-10-CM | POA: Diagnosis not present

## 2018-07-11 DIAGNOSIS — I10 Essential (primary) hypertension: Secondary | ICD-10-CM | POA: Diagnosis not present

## 2018-07-11 DIAGNOSIS — E785 Hyperlipidemia, unspecified: Secondary | ICD-10-CM | POA: Diagnosis not present

## 2018-07-11 DIAGNOSIS — Z803 Family history of malignant neoplasm of breast: Secondary | ICD-10-CM | POA: Insufficient documentation

## 2018-07-11 DIAGNOSIS — Z87891 Personal history of nicotine dependence: Secondary | ICD-10-CM | POA: Diagnosis not present

## 2018-07-11 DIAGNOSIS — J449 Chronic obstructive pulmonary disease, unspecified: Secondary | ICD-10-CM | POA: Diagnosis not present

## 2018-07-11 MED ORDER — HYDROCHLOROTHIAZIDE 12.5 MG PO TABS: 12.5000 mg | ORAL_TABLET | Freq: Every day | ORAL | 1 refills | Status: DC

## 2018-07-11 MED ORDER — SULFAMETHOXAZOLE-TRIMETHOPRIM 800-160 MG PO TABS: 1.0000 | ORAL_TABLET | Freq: Two times a day (BID) | ORAL | 0 refills | Status: DC

## 2018-07-11 NOTE — Consult Note (Signed)
NEW PATIENT EVALUATION  Name: Tammy Choi  MRN: [id number]  Date:   07/11/2018     DOB: 03/06/1946   This 72 y.o. female patient presents to the clinic for initial evaluation of stage IA (T1 1 N0 M0) invasive mammary carcinoma.ER positive PR HER-2/neu negative status post wide local excision and sentinel node biopsy  REFERRING PHYSICIAN: Sonnenberg, Eric G, MD  CHIEF COMPLAINT:  Chief Complaint  Patient presents with  . Breast Cancer    Initial Eval    DIAGNOSIS: The encounter diagnosis was Malignant neoplasm of lower-inner quadrant of right breast of female, estrogen receptor positive (HCC).   PREVIOUS INVESTIGATIONS:  Pathology reports reviewed Mammograms and ultrasound reviewed Clinical notes reviewed  HPI: patient is a 72-year-old female who presented with an abnormal mammogram of her right breast showing a 0.6 cm area of architectural distortion in the right breast 1:00 position 5 cm from the nipple. This was confirmed on ultrasound. She underwent ultrasound-guided biopsy which was positive for invasive mammary carcinoma.she underwent a wide local excision and sentinel node biopsy showing 0.4 cm area of invasive mammary carcinoma overall grade 2 with extensive intraductal component. Margins were all clear at 8 mm from the invasive component and 3 mm from the DCIS. Tumor was ER positive PR HER-2/neu not overexpressed.3 lymph nodes were negative for malignancy. She's had some postoperative mastitis, initial course of doxycycline still with some redness and inflammation of the right breast. She is otherwise doing well. She had ultrasound showing a seroma cavity suitable for MammoSite balloon placement. She is now referred to radiation oncology for opinion.  PLANNED TREATMENT REGIMEN: probable whole breast radiation  PAST MEDICAL HISTORY:  has a past medical history of Breast cancer (HCC) (05/07/2018), COPD (chronic obstructive pulmonary disease) (HCC), Depression, HTN  (hypertension), and Hyperlipemia.    PAST SURGICAL HISTORY:  Past Surgical History:  Procedure Laterality Date  . ABDOMINAL HYSTERECTOMY    . BREAST BIOPSY Left 04/30/2013   neg core  . BREAST BIOPSY Right 05/07/2018   right breast stereo x clip INVASIVE MAMMARY CARCINOMA  . BREAST LUMPECTOMY WITH SENTINEL LYMPH NODE BIOPSY Right 06/16/2018   Procedure: BREAST LUMPECTOMY WITH SENTINEL LYMPH NODE BX;  Surgeon: Byrnett, Jeffrey W, MD;  Location: [facility] ORS;  Service: General;  Laterality: Right;  . COLONOSCOPY  2014   Dr Byrnett  . Left Leg Surgery    . TONSILLECTOMY      FAMILY HISTORY: family history includes Breast cancer (age of onset: 40) in her other; Heart disease in her father; Kidney disease in her mother.  SOCIAL HISTORY:  reports that she quit smoking about 2 years ago. Her smoking use included cigarettes. She has a 35.00 pack-year smoking history. She has never used smokeless tobacco. She reports that she does not drink alcohol or use drugs.  ALLERGIES: Penicillins  MEDICATIONS:  Current Outpatient Medications  Medication Sig Dispense Refill  . acetaminophen (TYLENOL) 500 MG tablet Take 500-1,000 mg by mouth 2 (two) times daily as needed for moderate pain or headache.    . albuterol (PROAIR HFA) 108 (90 Base) MCG/ACT inhaler Inhale 2 puffs into the lungs every 6 (six) hours as needed for wheezing or shortness of breath. 1 Inhaler 2  . budesonide-formoterol (SYMBICORT) 160-4.5 MCG/ACT inhaler Inhale 2 puffs into the lungs 2 (two) times daily. 1 Inhaler 5  . cholecalciferol (VITAMIN D) 1000 UNITS tablet Take 2,000 Units by mouth every other day.     . doxycycline (VIBRA-TABS) 100 MG tablet Take 1   tablet (100 mg total) by mouth 2 (two) times daily. 14 tablet 0  . FLUoxetine (PROZAC) 40 MG capsule Take 1 capsule (40 mg total) by mouth at bedtime. 90 capsule 3  . hydrochlorothiazide (HYDRODIURIL) 12.5 MG tablet Take 1 tablet (12.5 mg total) by mouth daily. 90 tablet 1  . lisinopril  (PRINIVIL,ZESTRIL) 40 MG tablet TAKE 1 TABLET BY MOUTH ONCE DAILY (Patient taking differently: Take 40 mg by mouth at bedtime. ) 90 tablet 3  . Multiple Vitamins-Minerals (MULTIVITAMIN WITH MINERALS) tablet Take 1 tablet by mouth every other day.     . rosuvastatin (CRESTOR) 5 MG tablet TAKE 1 TABLET BY MOUTH ONCE DAILY 90 tablet 3  . sulfamethoxazole-trimethoprim (BACTRIM DS,SEPTRA DS) 800-160 MG tablet Take 1 tablet by mouth 2 (two) times daily. 20 tablet 0   No current facility-administered medications for this encounter.     ECOG PERFORMANCE STATUS:  1 - Symptomatic but completely ambulatory  REVIEW OF SYSTEMS:  Patient denies any weight loss, fatigue, weakness, fever, chills or night sweats. Patient denies any loss of vision, blurred vision. Patient denies any ringing  of the ears or hearing loss. No irregular heartbeat. Patient denies heart murmur or history of fainting. Patient denies any chest pain or pain radiating to her upper extremities. Patient denies any shortness of breath, difficulty breathing at night, cough or hemoptysis. Patient denies any swelling in the lower legs. Patient denies any nausea vomiting, vomiting of blood, or coffee ground material in the vomitus. Patient denies any stomach pain. Patient states has had normal bowel movements no significant constipation or diarrhea. Patient denies any dysuria, hematuria or significant nocturia. Patient denies any problems walking, swelling in the joints or loss of balance. Patient denies any skin changes, loss of hair or loss of weight. Patient denies any excessive worrying or anxiety or significant depression. Patient denies any problems with insomnia. Patient denies excessive thirst, polyuria, polydipsia. Patient denies any swollen glands, patient denies easy bruising or easy bleeding. Patient denies any recent infections, allergies or URI. Patient "s visual fields have not changed significantly in recent time.    PHYSICAL EXAM: BP  (!) (P) 144/83 (BP Location: Right Arm, Patient Position: Sitting)   Pulse (P) 81   Temp (P) 97.8 F (36.6 C) (Tympanic)   Wt (P) 190 lb 0.6 oz (86.2 kg)   BMI (P) 34.76 kg/m  Right breast is somewhat erythematous. Incision is healing well. No dominant mass or nodularity is noted in either breast except for the seroma cavity in the right breast. No axillary or supraclavicular adenopathy is appreciated.Well-developed well-nourished patient in NAD. HEENT reveals PERLA, EOMI, discs not visualized.  Oral cavity is clear. No oral mucosal lesions are identified. Neck is clear without evidence of cervical or supraclavicular adenopathy. Lungs are clear to A&P. Cardiac examination is essentially unremarkable with regular rate and rhythm without murmur rub or thrill. Abdomen is benign with no organomegaly or masses noted. Motor sensory and DTR levels are equal and symmetric in the upper and lower extremities. Cranial nerves II through XII are grossly intact. Proprioception is intact. No peripheral adenopathy or edema is identified. No motor or sensory levels are noted. Crude visual fields are within normal range.  LABORATORY DATA: pathology reports reviewed    RADIOLOGY RESULTS:mammogram and ultrasound reviewed   IMPRESSION: stage IA invasive mammary carcinoma the right breast as was wide local excision and sentinel node biopsy ER/PR positive PR HER-2/neu negative in 72 year old female with extensive intraductal component.  PLAN: at this  time I discussed the case with Dr. Tollie Pizza. Based on the extensive intraductal component this is a higher risk for recurrence in patients with partial breast irradiation. We will review her pathology with Dr. Truddie Coco [place]. My first impression is to go with whole breast radiation to 5040 cGy in 28 fractions boosting her scar another 1600 cGy based on her close margin. Risks and benefits of treatment including skin reaction fatigue alteration of blood counts possible inclusion of  superficial lung all were discussed in detail with the patient. She seems to comprehend my treatment plan well. Patient may benefit from anti-estrogen therapy after completion of radiation. I'm starting her on Bactrim DS 1 tablet twice a day for 10 days after discussion with Dr. Tollie Pizza about persistent erythema. I have personally set up and ordered CT simulation in about a week and a half to allow further resolution of her mastitis. Patient seems to comprehend her treatment plan well.  I would like to take this opportunity to thank you for allowing me to participate in the care of your patient.Noreene Filbert, MD

## 2018-07-11 NOTE — Telephone Encounter (Signed)
Sent to pharmacy.  Please get her scheduled for a BMP in 1 month.  Order has been placed.

## 2018-07-11 NOTE — Telephone Encounter (Signed)
Pt needs a rx for hydrochlorothiazide called in

## 2018-07-15 ENCOUNTER — Encounter: Payer: Self-pay | Admitting: General Surgery

## 2018-07-15 ENCOUNTER — Ambulatory Visit: Payer: Self-pay

## 2018-07-15 ENCOUNTER — Ambulatory Visit (INDEPENDENT_AMBULATORY_CARE_PROVIDER_SITE_OTHER): Payer: PPO | Admitting: General Surgery

## 2018-07-15 VITALS — BP 139/63 | HR 96 | Temp 97.5°F | Resp 15 | Ht 62.0 in | Wt 187.0 lb

## 2018-07-15 DIAGNOSIS — C50311 Malignant neoplasm of lower-inner quadrant of right female breast: Secondary | ICD-10-CM

## 2018-07-15 DIAGNOSIS — Z170 Estrogen receptor positive status [ER+]: Principal | ICD-10-CM

## 2018-07-15 MED ORDER — PREDNISONE 10 MG (21) PO TBPK: ORAL_TABLET | ORAL | 0 refills | Status: DC

## 2018-07-15 NOTE — Telephone Encounter (Signed)
Called and spoke with pt. Pt advised and has been scheduled for a lab appt on 08/01/2018 @ 3:15 PM.

## 2018-07-15 NOTE — Patient Instructions (Signed)
The patient is aware to call back for any questions or concerns.  

## 2018-07-15 NOTE — Progress Notes (Signed)
Patient ID: Tammy Choi, female   DOB: 1946-05-21, 72 y.o.   MRN: [id number]  Chief Complaint  Patient presents with  . Breast Problem    HPI Tammy Choi is a 72 y.o. female. here for right breast evaluation referred by Dr Baruch Gouty. She states it was red and he placed her antibiotics Bactrim DS on Friday. She has not noticed any changes in her breast. occasional sharp right breast that come and go quickly.   HPI  Past Medical History:  Diagnosis Date  . Breast cancer ([place]) 05/07/2018   T1anN0; ER/ PR positive, Her 2 neu not overexpressed.   Marland Kitchen COPD (chronic obstructive pulmonary disease) (HCC)    Not on home o2  . Depression   . HTN (hypertension)   . Hyperlipemia     Past Surgical History:  Procedure Laterality Date  . ABDOMINAL HYSTERECTOMY    . BREAST BIOPSY Left 04/30/2013   neg core  . BREAST BIOPSY Right 05/07/2018   right breast stereo x clip INVASIVE MAMMARY CARCINOMA  . BREAST LUMPECTOMY WITH SENTINEL LYMPH NODE BIOPSY Right 06/16/2018   Procedure: BREAST LUMPECTOMY WITH SENTINEL LYMPH NODE BX;  Surgeon: Robert Bellow, MD;  Location: [facility] ORS;  Service: General;  Laterality: Right;  . COLONOSCOPY  2014   Dr Bary Castilla  . Left Leg Surgery    . TONSILLECTOMY      Family History  Problem Relation Age of Onset  . Kidney disease Mother        deceased 58  . Heart disease Father        deceased 33  . Breast cancer Other 41       maternal half-sister; deceased 40  . Colon cancer Neg Hx     Social History Social History   Tobacco Use  . Smoking status: Former Smoker    Packs/day: 1.00    Years: 35.00    Pack years: 35.00    Types: Cigarettes    Last attempt to quit: 12/20/2015    Years since quitting: 2.5  . Smokeless tobacco: Never Used  Substance Use Topics  . Alcohol use: No    Alcohol/week: 0.0 standard drinks  . Drug use: No    Allergies  Allergen Reactions  . Penicillins Anaphylaxis and Other (See Comments)    Has patient had a  PCN reaction causing immediate rash, facial/tongue/throat swelling, SOB or lightheadedness with hypotension: Yes Has patient had a PCN reaction causing severe rash involving mucus membranes or skin necrosis: No Has patient had a PCN reaction that required hospitalization No Has patient had a PCN reaction occurring within the last 10 years: No If all of the above answers are "NO", then may proceed with Cephalosporin use.    Current Outpatient Medications  Medication Sig Dispense Refill  . acetaminophen (TYLENOL) 500 MG tablet Take 500-1,000 mg by mouth 2 (two) times daily as needed for moderate pain or headache.    . albuterol (PROAIR HFA) 108 (90 Base) MCG/ACT inhaler Inhale 2 puffs into the lungs every 6 (six) hours as needed for wheezing or shortness of breath. 1 Inhaler 2  . budesonide-formoterol (SYMBICORT) 160-4.5 MCG/ACT inhaler Inhale 2 puffs into the lungs 2 (two) times daily. 1 Inhaler 5  . cholecalciferol (VITAMIN D) 1000 UNITS tablet Take 2,000 Units by mouth every other day.     Marland Kitchen FLUoxetine (PROZAC) 40 MG capsule Take 1 capsule (40 mg total) by mouth at bedtime. 90 capsule 3  . hydrochlorothiazide (HYDRODIURIL) 12.5 MG tablet  Take 1 tablet (12.5 mg total) by mouth daily. 90 tablet 1  . lisinopril (PRINIVIL,ZESTRIL) 40 MG tablet TAKE 1 TABLET BY MOUTH ONCE DAILY (Patient taking differently: Take 40 mg by mouth at bedtime. ) 90 tablet 3  . Multiple Vitamins-Minerals (MULTIVITAMIN WITH MINERALS) tablet Take 1 tablet by mouth every other day.     . rosuvastatin (CRESTOR) 5 MG tablet TAKE 1 TABLET BY MOUTH ONCE DAILY 90 tablet 3  . sulfamethoxazole-trimethoprim (BACTRIM DS,SEPTRA DS) 800-160 MG tablet Take 1 tablet by mouth 2 (two) times daily. 20 tablet 0  . predniSONE (STERAPRED UNI-PAK 21 TAB) 10 MG (21) TBPK tablet Taper down by 10 mg daily (60, 50, 40 30, 20 10) 21 tablet 0   No current facility-administered medications for this visit.     Review of Systems Review of Systems   Constitutional: Negative.   Respiratory: Negative.   Cardiovascular: Negative.     Blood pressure 139/63, pulse 96, temperature (!) 97.5 F (36.4 C), temperature source Skin, resp. rate 15, height 5\' 2"  (1.575 m), weight 187 lb (84.8 kg), SpO2 96 %.  Physical Exam Physical Exam  Constitutional: She is oriented to person, place, and time. She appears well-developed and well-nourished.  Pulmonary/Chest: Right breast exhibits skin change and tenderness.    Neurological: She is alert and oriented to person, place, and time.  Skin: Skin is warm and dry.  Psychiatric: Her behavior is normal.    Data Reviewed Ultrasound examination was undertaken to reassess for the potential of accelerated partial breast radiation and to look for an occult source of infection.  The seroma cavity at the wide excision site is nearly gone, likely making her less than ideal candidate for accelerated partial breast radiation.  Scanning through the remaining areas of the breast showed no evidence of large seromas potentially contributing to the breast erythema.  Assessment    Unexplained erythema without clinical evidence of infection, indeterminate response to 2 courses of antibiotics.    Plan    The patient will be placed on a prednisone taper and reassessed in 1 week.  Prednisone 10 mg 6 day taper    She will be a candidate for antiestrogen therapy after radiation is completed.  HPI, Physical Exam, Assessment and Plan have been scribed under the direction and in the presence of Robert Bellow, MD. Karie Fetch, RN  I have completed the exam and reviewed the above documentation for accuracy and completeness.  I agree with the above.  Haematologist has been used and any errors in dictation or transcription are unintentional.  Hervey Ard, M.D., F.A.C.S.  [name] Tammy Choi 07/15/2018, 7:53 PM

## 2018-07-22 ENCOUNTER — Other Ambulatory Visit: Payer: Self-pay

## 2018-07-22 ENCOUNTER — Encounter: Payer: Self-pay | Admitting: General Surgery

## 2018-07-22 ENCOUNTER — Ambulatory Visit (INDEPENDENT_AMBULATORY_CARE_PROVIDER_SITE_OTHER): Payer: PPO | Admitting: General Surgery

## 2018-07-22 VITALS — BP 134/81 | HR 65 | Temp 97.7°F | Resp 12 | Ht 62.0 in | Wt 186.8 lb

## 2018-07-22 DIAGNOSIS — C50311 Malignant neoplasm of lower-inner quadrant of right female breast: Secondary | ICD-10-CM

## 2018-07-22 DIAGNOSIS — Z170 Estrogen receptor positive status [ER+]: Secondary | ICD-10-CM

## 2018-07-22 NOTE — Progress Notes (Signed)
Patient ID: Tammy Choi, female   DOB: 01-06-1946, 72 y.o.   MRN: [id number]  Chief Complaint  Patient presents with  . Follow-up     1 week f/u redness around breast    HPI Tammy Choi is a 72 y.o. female here today to follow up for redness right breast patient states she still has some redness associated with sharp pains. Patient is here today with her daughter. HPI  Past Medical History:  Diagnosis Date  . Breast cancer ([place]) 05/07/2018   T1a, N0; ER/ PR positive, Her 2 neu not overexpressed.   Marland Kitchen COPD (chronic obstructive pulmonary disease) (HCC)    Not on home o2  . Depression   . HTN (hypertension)   . Hyperlipemia     Past Surgical History:  Procedure Laterality Date  . ABDOMINAL HYSTERECTOMY    . BREAST BIOPSY Left 04/30/2013   neg core  . BREAST BIOPSY Right 05/07/2018   right breast stereo x clip INVASIVE MAMMARY CARCINOMA  . BREAST LUMPECTOMY WITH SENTINEL LYMPH NODE BIOPSY Right 06/16/2018   Procedure: BREAST LUMPECTOMY WITH SENTINEL LYMPH NODE BX;  Surgeon: Robert Bellow, MD;  Location: [facility] ORS;  Service: General;  Laterality: Right;  . COLONOSCOPY  2014   Dr Bary Castilla  . Left Leg Surgery    . TONSILLECTOMY      Family History  Problem Relation Age of Onset  . Kidney disease Mother        deceased 66  . Heart disease Father        deceased 50  . Breast cancer Other 39       maternal half-sister; deceased 53  . Colon cancer Neg Hx     Social History Social History   Tobacco Use  . Smoking status: Former Smoker    Packs/day: 1.00    Years: 35.00    Pack years: 35.00    Types: Cigarettes    Last attempt to quit: 12/20/2015    Years since quitting: 2.5  . Smokeless tobacco: Never Used  Substance Use Topics  . Alcohol use: No    Alcohol/week: 0.0 standard drinks  . Drug use: No    Allergies  Allergen Reactions  . Penicillins Anaphylaxis and Other (See Comments)    Has patient had a PCN reaction causing immediate rash,  facial/tongue/throat swelling, SOB or lightheadedness with hypotension: Yes Has patient had a PCN reaction causing severe rash involving mucus membranes or skin necrosis: No Has patient had a PCN reaction that required hospitalization No Has patient had a PCN reaction occurring within the last 10 years: No If all of the above answers are "NO", then may proceed with Cephalosporin use.    Current Outpatient Medications  Medication Sig Dispense Refill  . acetaminophen (TYLENOL) 500 MG tablet Take 500-1,000 mg by mouth 2 (two) times daily as needed for moderate pain or headache.    . albuterol (PROAIR HFA) 108 (90 Base) MCG/ACT inhaler Inhale 2 puffs into the lungs every 6 (six) hours as needed for wheezing or shortness of breath. 1 Inhaler 2  . budesonide-formoterol (SYMBICORT) 160-4.5 MCG/ACT inhaler Inhale 2 puffs into the lungs 2 (two) times daily. 1 Inhaler 5  . cholecalciferol (VITAMIN D) 1000 UNITS tablet Take 2,000 Units by mouth every other day.     Marland Kitchen FLUoxetine (PROZAC) 40 MG capsule Take 1 capsule (40 mg total) by mouth at bedtime. 90 capsule 3  . hydrochlorothiazide (HYDRODIURIL) 12.5 MG tablet Take 1 tablet (12.5 mg total)  by mouth daily. 90 tablet 1  . lisinopril (PRINIVIL,ZESTRIL) 40 MG tablet TAKE 1 TABLET BY MOUTH ONCE DAILY (Patient taking differently: Take 40 mg by mouth at bedtime. ) 90 tablet 3  . Multiple Vitamins-Minerals (MULTIVITAMIN WITH MINERALS) tablet Take 1 tablet by mouth every other day.     . rosuvastatin (CRESTOR) 5 MG tablet TAKE 1 TABLET BY MOUTH ONCE DAILY 90 tablet 3   No current facility-administered medications for this visit.     Review of Systems Review of Systems  Blood pressure 134/81, pulse 65, temperature 97.7 F (36.5 C), temperature source Temporal, resp. rate 12, height 5\' 2"  (1.575 m), weight 186 lb 12.8 oz (84.7 kg), SpO2 95 %.  Physical Exam Physical Exam  Pulmonary/Chest:         Assessment    Marked improvement post steroid  treatment.     Plan Patient is to return next week. Patient has been informed to call the office with any questions or concerns. HPI, Physical Exam, Assessment and Plan have been scribed under the direction and in the presence of Tammy Ard, Md.  Tammy Choi, CMA I have completed the exam and reviewed the above documentation for accuracy and completeness.  I agree with the above.  [employer] has been used and any errors in dictation or transcription are unintentional.  Tammy Choi, M.D., F.A.C.S. ,  Tammy Choi 07/23/2018, 9:05 PM

## 2018-07-22 NOTE — Patient Instructions (Addendum)
Patient is to return next week. Patient has been informed to call the office with any questions or concerns.

## 2018-07-23 ENCOUNTER — Ambulatory Visit
Admission: RE | Admit: 2018-07-23 | Discharge: 2018-07-23 | Disposition: A | Discharge status: Home / Self Care | Payer: PPO | Source: Ambulatory Visit | Attending: Radiation Oncology | Admitting: Radiation Oncology

## 2018-07-23 ENCOUNTER — Encounter: Payer: Self-pay | Admitting: *Deleted

## 2018-07-23 DIAGNOSIS — Z170 Estrogen receptor positive status [ER+]: Secondary | ICD-10-CM | POA: Insufficient documentation

## 2018-07-23 DIAGNOSIS — C50311 Malignant neoplasm of lower-inner quadrant of right female breast: Secondary | ICD-10-CM | POA: Diagnosis not present

## 2018-07-23 NOTE — Progress Notes (Signed)
  Oncology Nurse Navigator Documentation  Navigator Location: [facility] (07/23/18 0800)   )Navigator Encounter Type: Telephone (07/23/18 0800)                                                    Time Spent with Patient: 15 (07/23/18 0800)   Left patient a message with her follow-up appointment with Dr. Tasia Catchings on 09/29/18 @ 2:00.

## 2018-07-24 DIAGNOSIS — C50311 Malignant neoplasm of lower-inner quadrant of right female breast: Secondary | ICD-10-CM | POA: Diagnosis not present

## 2018-07-24 DIAGNOSIS — Z170 Estrogen receptor positive status [ER+]: Secondary | ICD-10-CM | POA: Diagnosis not present

## 2018-07-25 ENCOUNTER — Other Ambulatory Visit: Payer: Self-pay | Admitting: *Deleted

## 2018-07-25 DIAGNOSIS — Z170 Estrogen receptor positive status [ER+]: Principal | ICD-10-CM

## 2018-07-25 DIAGNOSIS — C50311 Malignant neoplasm of lower-inner quadrant of right female breast: Secondary | ICD-10-CM

## 2018-07-29 ENCOUNTER — Ambulatory Visit (INDEPENDENT_AMBULATORY_CARE_PROVIDER_SITE_OTHER): Payer: PPO | Admitting: General Surgery

## 2018-07-29 ENCOUNTER — Encounter: Payer: Self-pay | Admitting: General Surgery

## 2018-07-29 ENCOUNTER — Other Ambulatory Visit: Payer: Self-pay

## 2018-07-29 VITALS — BP 106/82 | HR 72 | Temp 97.2°F | Resp 18 | Ht 62.0 in | Wt 187.6 lb

## 2018-07-29 DIAGNOSIS — Z170 Estrogen receptor positive status [ER+]: Secondary | ICD-10-CM

## 2018-07-29 DIAGNOSIS — C50311 Malignant neoplasm of lower-inner quadrant of right female breast: Secondary | ICD-10-CM

## 2018-07-29 NOTE — Patient Instructions (Signed)
Please follow up in 3 months.  

## 2018-07-29 NOTE — Progress Notes (Signed)
Patient ID: Tammy Choi, female   DOB: 16-Jul-1946, 72 y.o.   MRN: [id number]  Chief Complaint  Patient presents with  . Follow-up    Right breast cancer    HPI Tammy Choi is a 72 y.o. female. Here today for right breast cancer. Patient starts Radiation 08/04/18. No complaints today. Patient is going to have genetic testing next week.  HPI  Past Medical History:  Diagnosis Date  . Breast cancer ([place]) 05/07/2018   T1a, N0; ER/ PR positive, Her 2 neu not overexpressed.   Marland Kitchen COPD (chronic obstructive pulmonary disease) (HCC)    Not on home o2  . Depression   . HTN (hypertension)   . Hyperlipemia     Past Surgical History:  Procedure Laterality Date  . ABDOMINAL HYSTERECTOMY    . BREAST BIOPSY Left 04/30/2013   neg core  . BREAST BIOPSY Right 05/07/2018   right breast stereo x clip INVASIVE MAMMARY CARCINOMA  . BREAST LUMPECTOMY WITH SENTINEL LYMPH NODE BIOPSY Right 06/16/2018   Procedure: BREAST LUMPECTOMY WITH SENTINEL LYMPH NODE BX;  Surgeon: Robert Bellow, MD;  Location: [facility] ORS;  Service: General;  Laterality: Right;  . COLONOSCOPY  2014   Dr Bary Castilla  . Left Leg Surgery    . TONSILLECTOMY      Family History  Problem Relation Age of Onset  . Kidney disease Mother        deceased 9  . Heart disease Father        deceased 30  . Breast cancer Other 31       maternal half-sister; deceased 60  . Colon cancer Neg Hx     Social History Social History   Tobacco Use  . Smoking status: Former Smoker    Packs/day: 1.00    Years: 35.00    Pack years: 35.00    Types: Cigarettes    Last attempt to quit: 12/20/2015    Years since quitting: 2.6  . Smokeless tobacco: Never Used  Substance Use Topics  . Alcohol use: No    Alcohol/week: 0.0 standard drinks  . Drug use: No    Allergies  Allergen Reactions  . Penicillins Anaphylaxis and Other (See Comments)    Has patient had a PCN reaction causing immediate rash, facial/tongue/throat swelling, SOB or  lightheadedness with hypotension: Yes Has patient had a PCN reaction causing severe rash involving mucus membranes or skin necrosis: No Has patient had a PCN reaction that required hospitalization No Has patient had a PCN reaction occurring within the last 10 years: No If all of the above answers are "NO", then may proceed with Cephalosporin use.    Current Outpatient Medications  Medication Sig Dispense Refill  . acetaminophen (TYLENOL) 500 MG tablet Take 500-1,000 mg by mouth 2 (two) times daily as needed for moderate pain or headache.    . albuterol (PROAIR HFA) 108 (90 Base) MCG/ACT inhaler Inhale 2 puffs into the lungs every 6 (six) hours as needed for wheezing or shortness of breath. 1 Inhaler 2  . budesonide-formoterol (SYMBICORT) 160-4.5 MCG/ACT inhaler Inhale 2 puffs into the lungs 2 (two) times daily. 1 Inhaler 5  . cholecalciferol (VITAMIN D) 1000 UNITS tablet Take 2,000 Units by mouth every other day.     Marland Kitchen FLUoxetine (PROZAC) 40 MG capsule Take 1 capsule (40 mg total) by mouth at bedtime. 90 capsule 3  . hydrochlorothiazide (HYDRODIURIL) 12.5 MG tablet Take 1 tablet (12.5 mg total) by mouth daily. 90 tablet 1  .  lisinopril (PRINIVIL,ZESTRIL) 40 MG tablet TAKE 1 TABLET BY MOUTH ONCE DAILY (Patient taking differently: Take 40 mg by mouth at bedtime. ) 90 tablet 3  . Multiple Vitamins-Minerals (MULTIVITAMIN WITH MINERALS) tablet Take 1 tablet by mouth every other day.     . rosuvastatin (CRESTOR) 5 MG tablet TAKE 1 TABLET BY MOUTH ONCE DAILY 90 tablet 3   No current facility-administered medications for this visit.     Review of Systems Review of Systems  Constitutional: Negative.   Respiratory: Negative.   Skin: Negative.     Blood pressure 106/82, pulse 72, temperature (!) 97.2 F (36.2 C), temperature source Temporal, resp. rate 18, height 5\' 2"  (1.575 m), weight 187 lb 9.6 oz (85.1 kg), SpO2 92 %.  Physical Exam Physical Exam  Constitutional: She is oriented to person,  place, and time. She appears well-developed and well-nourished.  Eyes: Conjunctivae are normal. No scleral icterus.  Neck: Normal range of motion.  Cardiovascular: Normal rate, regular rhythm and normal heart sounds.  Pulmonary/Chest: Effort normal and breath sounds normal. Right breast exhibits no inverted nipple, no mass, no nipple discharge, no skin change and no tenderness. Left breast exhibits no inverted nipple, no mass, no nipple discharge, no skin change and no tenderness.    Lymphadenopathy:    She has no cervical adenopathy.  Neurological: She is alert and oriented to person, place, and time.  Skin: Skin is warm and dry.         Assessment    Doing well post wide excision.  Candidate for whole breast radiation.    Plan  Please follow up in 3 months.       HPI, Physical Exam, Assessment and Plan have been scribed under the direction and in the presence of Robert Bellow, MD. Tammy Choi, CMA  I have completed the exam and reviewed the above documentation for accuracy and completeness.  I agree with the above.  Haematologist has been used and any errors in dictation or transcription are unintentional.  Hervey Ard, M.D., F.A.C.S.  Tammy Choi 07/30/2018, 5:30 PM

## 2018-07-31 ENCOUNTER — Ambulatory Visit
Admission: RE | Admit: 2018-07-31 | Discharge: 2018-07-31 | Disposition: A | Discharge status: Home / Self Care | Payer: PPO | Source: Ambulatory Visit | Attending: Radiation Oncology | Admitting: Radiation Oncology

## 2018-07-31 DIAGNOSIS — Z170 Estrogen receptor positive status [ER+]: Secondary | ICD-10-CM | POA: Diagnosis not present

## 2018-07-31 DIAGNOSIS — C50311 Malignant neoplasm of lower-inner quadrant of right female breast: Secondary | ICD-10-CM | POA: Diagnosis not present

## 2018-08-01 ENCOUNTER — Other Ambulatory Visit (INDEPENDENT_AMBULATORY_CARE_PROVIDER_SITE_OTHER): Payer: PPO

## 2018-08-01 DIAGNOSIS — I10 Essential (primary) hypertension: Secondary | ICD-10-CM | POA: Diagnosis not present

## 2018-08-02 LAB — BASIC METABOLIC PANEL
BUN / CREAT RATIO: 38 (calc) — AB (ref 6–22)
BUN: 35 mg/dL — ABNORMAL HIGH (ref 7–25)
CALCIUM: 9.1 mg/dL (ref 8.6–10.4)
CHLORIDE: 103 mmol/L (ref 98–110)
CO2: 27 mmol/L (ref 20–32)
Creat: 0.91 mg/dL (ref 0.60–0.93)
Glucose, Bld: 86 mg/dL (ref 65–99)
POTASSIUM: 4.8 mmol/L (ref 3.5–5.3)
SODIUM: 138 mmol/L (ref 135–146)

## 2018-08-04 ENCOUNTER — Ambulatory Visit
Admission: RE | Admit: 2018-08-04 | Discharge: 2018-08-04 | Disposition: A | Discharge status: Home / Self Care | Payer: PPO | Source: Ambulatory Visit | Attending: Radiation Oncology | Admitting: Radiation Oncology

## 2018-08-04 DIAGNOSIS — Z170 Estrogen receptor positive status [ER+]: Secondary | ICD-10-CM | POA: Insufficient documentation

## 2018-08-04 DIAGNOSIS — C50311 Malignant neoplasm of lower-inner quadrant of right female breast: Secondary | ICD-10-CM | POA: Insufficient documentation

## 2018-08-05 ENCOUNTER — Ambulatory Visit
Admission: RE | Admit: 2018-08-05 | Discharge: 2018-08-05 | Disposition: A | Discharge status: Home / Self Care | Payer: PPO | Source: Ambulatory Visit | Attending: Radiation Oncology | Admitting: Radiation Oncology

## 2018-08-05 DIAGNOSIS — C50311 Malignant neoplasm of lower-inner quadrant of right female breast: Secondary | ICD-10-CM | POA: Diagnosis not present

## 2018-08-05 DIAGNOSIS — Z170 Estrogen receptor positive status [ER+]: Secondary | ICD-10-CM | POA: Diagnosis not present

## 2018-08-06 ENCOUNTER — Ambulatory Visit
Admission: RE | Admit: 2018-08-06 | Discharge: 2018-08-06 | Disposition: A | Discharge status: Home / Self Care | Payer: PPO | Source: Ambulatory Visit | Attending: Radiation Oncology | Admitting: Radiation Oncology

## 2018-08-06 DIAGNOSIS — C50311 Malignant neoplasm of lower-inner quadrant of right female breast: Secondary | ICD-10-CM | POA: Diagnosis not present

## 2018-08-06 DIAGNOSIS — Z170 Estrogen receptor positive status [ER+]: Secondary | ICD-10-CM | POA: Diagnosis not present

## 2018-08-07 ENCOUNTER — Ambulatory Visit
Admission: RE | Admit: 2018-08-07 | Discharge: 2018-08-07 | Disposition: A | Discharge status: Home / Self Care | Payer: PPO | Source: Ambulatory Visit | Attending: Radiation Oncology | Admitting: Radiation Oncology

## 2018-08-07 DIAGNOSIS — Z170 Estrogen receptor positive status [ER+]: Secondary | ICD-10-CM | POA: Diagnosis not present

## 2018-08-07 DIAGNOSIS — C50311 Malignant neoplasm of lower-inner quadrant of right female breast: Secondary | ICD-10-CM | POA: Diagnosis not present

## 2018-08-08 ENCOUNTER — Ambulatory Visit
Admission: RE | Admit: 2018-08-08 | Discharge: 2018-08-08 | Disposition: A | Discharge status: Home / Self Care | Payer: PPO | Source: Ambulatory Visit | Attending: Radiation Oncology | Admitting: Radiation Oncology

## 2018-08-08 DIAGNOSIS — Z170 Estrogen receptor positive status [ER+]: Secondary | ICD-10-CM | POA: Diagnosis not present

## 2018-08-08 DIAGNOSIS — C50311 Malignant neoplasm of lower-inner quadrant of right female breast: Secondary | ICD-10-CM | POA: Diagnosis not present

## 2018-08-11 ENCOUNTER — Ambulatory Visit
Admission: RE | Admit: 2018-08-11 | Discharge: 2018-08-11 | Disposition: A | Discharge status: Home / Self Care | Payer: PPO | Source: Ambulatory Visit | Attending: Radiation Oncology | Admitting: Radiation Oncology

## 2018-08-11 DIAGNOSIS — C50311 Malignant neoplasm of lower-inner quadrant of right female breast: Secondary | ICD-10-CM | POA: Diagnosis not present

## 2018-08-11 DIAGNOSIS — Z170 Estrogen receptor positive status [ER+]: Secondary | ICD-10-CM | POA: Diagnosis not present

## 2018-08-12 ENCOUNTER — Ambulatory Visit
Admission: RE | Admit: 2018-08-12 | Discharge: 2018-08-12 | Disposition: A | Discharge status: Home / Self Care | Payer: PPO | Source: Ambulatory Visit | Attending: Radiation Oncology | Admitting: Radiation Oncology

## 2018-08-12 DIAGNOSIS — Z170 Estrogen receptor positive status [ER+]: Secondary | ICD-10-CM | POA: Diagnosis not present

## 2018-08-12 DIAGNOSIS — C50311 Malignant neoplasm of lower-inner quadrant of right female breast: Secondary | ICD-10-CM | POA: Diagnosis not present

## 2018-08-13 ENCOUNTER — Ambulatory Visit
Admission: RE | Admit: 2018-08-13 | Discharge: 2018-08-13 | Disposition: A | Discharge status: Home / Self Care | Payer: PPO | Source: Ambulatory Visit | Attending: Radiation Oncology | Admitting: Radiation Oncology

## 2018-08-13 DIAGNOSIS — C50311 Malignant neoplasm of lower-inner quadrant of right female breast: Secondary | ICD-10-CM | POA: Diagnosis not present

## 2018-08-13 DIAGNOSIS — Z170 Estrogen receptor positive status [ER+]: Secondary | ICD-10-CM | POA: Diagnosis not present

## 2018-08-14 ENCOUNTER — Ambulatory Visit: Payer: PPO

## 2018-08-15 ENCOUNTER — Ambulatory Visit
Admission: RE | Admit: 2018-08-15 | Discharge: 2018-08-15 | Disposition: A | Discharge status: Home / Self Care | Payer: PPO | Source: Ambulatory Visit | Attending: Radiation Oncology | Admitting: Radiation Oncology

## 2018-08-15 DIAGNOSIS — C50311 Malignant neoplasm of lower-inner quadrant of right female breast: Secondary | ICD-10-CM | POA: Diagnosis not present

## 2018-08-18 ENCOUNTER — Ambulatory Visit
Admission: RE | Admit: 2018-08-18 | Discharge: 2018-08-18 | Disposition: A | Discharge status: Home / Self Care | Payer: PPO | Source: Ambulatory Visit | Attending: Radiation Oncology | Admitting: Radiation Oncology

## 2018-08-18 ENCOUNTER — Inpatient Hospital Stay: Payer: PPO | Attending: Radiation Oncology

## 2018-08-18 DIAGNOSIS — C50311 Malignant neoplasm of lower-inner quadrant of right female breast: Secondary | ICD-10-CM | POA: Diagnosis not present

## 2018-08-18 DIAGNOSIS — Z803 Family history of malignant neoplasm of breast: Secondary | ICD-10-CM | POA: Diagnosis not present

## 2018-08-18 DIAGNOSIS — Z170 Estrogen receptor positive status [ER+]: Secondary | ICD-10-CM

## 2018-08-18 LAB — CBC
HCT: 43.1 % (ref 36.0–46.0)
HEMOGLOBIN: 13.1 g/dL (ref 12.0–15.0)
MCH: 27.4 pg (ref 26.0–34.0)
MCHC: 30.4 g/dL (ref 30.0–36.0)
MCV: 90.2 fL (ref 80.0–100.0)
PLATELETS: 256 10*3/uL (ref 150–400)
RBC: 4.78 MIL/uL (ref 3.87–5.11)
RDW: 14.1 % (ref 11.5–15.5)
WBC: 7.7 10*3/uL (ref 4.0–10.5)
nRBC: 0 % (ref 0.0–0.2)

## 2018-08-19 ENCOUNTER — Ambulatory Visit
Admission: RE | Admit: 2018-08-19 | Discharge: 2018-08-19 | Disposition: A | Discharge status: Home / Self Care | Payer: PPO | Source: Ambulatory Visit | Attending: Radiation Oncology | Admitting: Radiation Oncology

## 2018-08-19 DIAGNOSIS — Z170 Estrogen receptor positive status [ER+]: Secondary | ICD-10-CM | POA: Diagnosis not present

## 2018-08-19 DIAGNOSIS — C50311 Malignant neoplasm of lower-inner quadrant of right female breast: Secondary | ICD-10-CM | POA: Diagnosis not present

## 2018-08-20 ENCOUNTER — Ambulatory Visit
Admission: RE | Admit: 2018-08-20 | Discharge: 2018-08-20 | Disposition: A | Discharge status: Home / Self Care | Payer: PPO | Source: Ambulatory Visit | Attending: Radiation Oncology | Admitting: Radiation Oncology

## 2018-08-20 DIAGNOSIS — C50311 Malignant neoplasm of lower-inner quadrant of right female breast: Secondary | ICD-10-CM | POA: Diagnosis not present

## 2018-08-21 ENCOUNTER — Ambulatory Visit
Admission: RE | Admit: 2018-08-21 | Discharge: 2018-08-21 | Disposition: A | Discharge status: Home / Self Care | Payer: PPO | Source: Ambulatory Visit | Attending: Radiation Oncology | Admitting: Radiation Oncology

## 2018-08-21 DIAGNOSIS — C50311 Malignant neoplasm of lower-inner quadrant of right female breast: Secondary | ICD-10-CM | POA: Diagnosis not present

## 2018-08-22 ENCOUNTER — Ambulatory Visit
Admission: RE | Admit: 2018-08-22 | Discharge: 2018-08-22 | Disposition: A | Discharge status: Home / Self Care | Payer: PPO | Source: Ambulatory Visit | Attending: Radiation Oncology | Admitting: Radiation Oncology

## 2018-08-22 DIAGNOSIS — C50311 Malignant neoplasm of lower-inner quadrant of right female breast: Secondary | ICD-10-CM | POA: Diagnosis not present

## 2018-08-22 DIAGNOSIS — Z170 Estrogen receptor positive status [ER+]: Secondary | ICD-10-CM | POA: Diagnosis not present

## 2018-08-25 ENCOUNTER — Ambulatory Visit: Payer: PPO

## 2018-08-25 ENCOUNTER — Ambulatory Visit
Admission: RE | Admit: 2018-08-25 | Discharge: 2018-08-25 | Disposition: A | Discharge status: Home / Self Care | Payer: PPO | Source: Ambulatory Visit | Attending: Radiation Oncology | Admitting: Radiation Oncology

## 2018-08-25 DIAGNOSIS — C50311 Malignant neoplasm of lower-inner quadrant of right female breast: Secondary | ICD-10-CM | POA: Diagnosis not present

## 2018-08-25 DIAGNOSIS — Z170 Estrogen receptor positive status [ER+]: Secondary | ICD-10-CM | POA: Diagnosis not present

## 2018-08-26 ENCOUNTER — Ambulatory Visit
Admission: RE | Admit: 2018-08-26 | Discharge: 2018-08-26 | Disposition: A | Discharge status: Home / Self Care | Payer: PPO | Source: Ambulatory Visit | Attending: Radiation Oncology | Admitting: Radiation Oncology

## 2018-08-26 DIAGNOSIS — Z170 Estrogen receptor positive status [ER+]: Secondary | ICD-10-CM | POA: Diagnosis not present

## 2018-08-26 DIAGNOSIS — C50311 Malignant neoplasm of lower-inner quadrant of right female breast: Secondary | ICD-10-CM | POA: Diagnosis not present

## 2018-08-27 ENCOUNTER — Ambulatory Visit
Admission: RE | Admit: 2018-08-27 | Discharge: 2018-08-27 | Disposition: A | Discharge status: Home / Self Care | Payer: PPO | Source: Ambulatory Visit | Attending: Radiation Oncology | Admitting: Radiation Oncology

## 2018-08-27 ENCOUNTER — Telehealth: Payer: Self-pay | Admitting: Genetic Counselor

## 2018-08-27 DIAGNOSIS — Z170 Estrogen receptor positive status [ER+]: Secondary | ICD-10-CM | POA: Diagnosis not present

## 2018-08-27 DIAGNOSIS — C50311 Malignant neoplasm of lower-inner quadrant of right female breast: Secondary | ICD-10-CM | POA: Diagnosis not present

## 2018-08-27 NOTE — Telephone Encounter (Signed)
Cancer Genetics             Telegenetics Results Disclosure   Patient Name: Tammy Choi Patient DOB: Oct 20, 1945 Patient Age: 72 y.o. Phone Call Date: 08/27/2018  Referring Provider: Earlie Server, MD    Ms. Pittsley was called today to discuss genetic test results. Please see the Genetics telephone note from 05/29/2018 for a detailed discussion of her personal and family histories and the recommendations provided.  Genetic Testing: At the time of Ms. Hatcher's telegenetics visit, she decided to pursue genetic testing of multiple genes associated with hereditary susceptibility to cancer. Testing included sequencing and deletion/duplication analysis. Testing did not reveal a pathogenic mutation in any of the genes analyzed.  A copy of the genetic test report will be scanned into Epic under the Media tab.  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 This is still considered a normal result. While at this time, it is unknown if this finding is associated with increased cancer risk, the majority of these variants get reclassified to be inconsequential. Medical management should not be based on this finding. The lab will eventually determine the significance, if any. If it gets reclassified by the lab, she will receive a new report via their secure portal, by secure email, or by mail. Ms. Kanady should stay in contact with [place] on a yearly basis if she is interested in knowing the status of this result and keep her address and phone number up to date in the system.  Interpretation: These results suggest that Ms. Maeder's cancer was most likely not due to an inherited predisposition. Most cancers happen by chance and this test, along with details of her family history, suggests that her cancer falls into this category.   Since the current test is not perfect, it is possible that there may be a gene mutation that current testing  cannot detect, but that chance is small. It is possible that a different genetic factor, which has not yet been discovered or is not on this panel, is responsible for the cancer diagnoses in the family. Again, the likelihood of this is low. No additional testing is recommended at this time for Ms. Schwartz.  Genes Analyzed: The genes analyzed were the 84 genes on [facility]'s Multi-Cancer panel (AIP, ALK, APC, ATM, AXIN2, BAP1, BARD1, BLM, BMPR1A, BRCA1, BRCA2, BRIP1, CASR, CDC73, CDH1, CDK4, CDKN1B, CDKN1C, CDKN2A, CEBPA, CHEK2, CTNNA1, DICER1, DIS3L2, EGFR, EPCAM, FH, FLCN, GATA2, GPC3, GREM1, HOXB13, HRAS, KIT, MAX, MEN1, MET, MITF, MLH1, MSH2, MSH3, MSH6, MUTYH, NBN, NF1, NF2, NTHL1, PALB2, PDGFRA, PHOX2B, PMS2, POLD1, POLE, POT1, PRKAR1A, PTCH1, PTEN, RAD50, RAD51C, RAD51D, RB1, RECQL4, RET, RUNX1, SDHA, SDHAF2, SDHB, SDHC, SDHD, SMAD4, SMARCA4, SMARCB1, SMARCE1, STK11, SUFU, TERC, TERT, TMEM127, TP53, TSC1, TSC2, VHL, WRN, WT1).  Cancer Screening: Ms. Ramnauth is recommended to follow the cancer screening guidelines provided by her physicians.   Family Members: Family members are at some increased risk of developing cancer, over the general population risk, simply due to the family history. Women are recommended to have a yearly mammogram beginning at age 27, a yearly clinical breast exam, a yearly gynecologic exam and perform monthly breast self-exams. Colon cancer screening is recommended to begin by age 24 in both men and women, unless there is a family history of colon cancer or colon polyps or an individual has a personal history to warrant  initiating screening at a younger age.  Any relative who had cancer at a young age or had a particularly rare cancer may also wish to pursue genetic testing. Genetic counselors can be located in other cities, by visiting the website of the [employer] ([employer]) and Field seismologist for a Dietitian by zip code.   Family members  are not recommended to get tested for the above VUS outside of a research protocol as this finding has no implications for their medical management.  Follow-Up: Cancer genetics is a rapidly advancing field and it is possible that new genetic tests will be appropriate for Ms. Nurse in the future. Ms. Cumpton is encouraged to remain in contact with Genetics on an annual basis so we can update her personal and family histories, and let her know of advances in cancer genetics that may benefit the family. Ms. Rodkey's questions were answered to her satisfaction today, and she knows she is welcome to call anytime with additional questions.    Steele Berg, MS, [place] Certified Genetic Counselor phone: [phone]

## 2018-09-01 ENCOUNTER — Other Ambulatory Visit: Payer: Self-pay

## 2018-09-01 ENCOUNTER — Ambulatory Visit
Admission: RE | Admit: 2018-09-01 | Discharge: 2018-09-01 | Disposition: A | Discharge status: Home / Self Care | Payer: PPO | Source: Ambulatory Visit | Attending: Radiation Oncology | Admitting: Radiation Oncology

## 2018-09-01 ENCOUNTER — Inpatient Hospital Stay: Payer: PPO | Attending: Radiation Oncology

## 2018-09-01 DIAGNOSIS — J209 Acute bronchitis, unspecified: Secondary | ICD-10-CM | POA: Diagnosis not present

## 2018-09-01 DIAGNOSIS — Z170 Estrogen receptor positive status [ER+]: Secondary | ICD-10-CM | POA: Insufficient documentation

## 2018-09-01 DIAGNOSIS — C50311 Malignant neoplasm of lower-inner quadrant of right female breast: Secondary | ICD-10-CM | POA: Insufficient documentation

## 2018-09-01 LAB — CBC
HCT: 40.5 % (ref 36.0–46.0)
Hemoglobin: 12.4 g/dL (ref 12.0–15.0)
MCH: 27.8 pg (ref 26.0–34.0)
MCHC: 30.6 g/dL (ref 30.0–36.0)
MCV: 90.8 fL (ref 80.0–100.0)
NRBC: 0 % (ref 0.0–0.2)
PLATELETS: 209 10*3/uL (ref 150–400)
RBC: 4.46 MIL/uL (ref 3.87–5.11)
RDW: 14.1 % (ref 11.5–15.5)
WBC: 8.1 10*3/uL (ref 4.0–10.5)

## 2018-09-02 ENCOUNTER — Ambulatory Visit: Payer: PPO

## 2018-09-03 ENCOUNTER — Ambulatory Visit
Admission: RE | Admit: 2018-09-03 | Discharge: 2018-09-03 | Disposition: A | Discharge status: Home / Self Care | Payer: PPO | Source: Ambulatory Visit | Attending: Radiation Oncology | Admitting: Radiation Oncology

## 2018-09-03 DIAGNOSIS — C50311 Malignant neoplasm of lower-inner quadrant of right female breast: Secondary | ICD-10-CM | POA: Diagnosis not present

## 2018-09-03 DIAGNOSIS — Z170 Estrogen receptor positive status [ER+]: Secondary | ICD-10-CM | POA: Diagnosis not present

## 2018-09-04 ENCOUNTER — Ambulatory Visit: Payer: PPO

## 2018-09-05 ENCOUNTER — Ambulatory Visit: Payer: PPO

## 2018-09-05 DIAGNOSIS — Z170 Estrogen receptor positive status [ER+]: Secondary | ICD-10-CM | POA: Diagnosis not present

## 2018-09-05 DIAGNOSIS — C50311 Malignant neoplasm of lower-inner quadrant of right female breast: Secondary | ICD-10-CM | POA: Diagnosis not present

## 2018-09-07 ENCOUNTER — Ambulatory Visit: Payer: PPO

## 2018-09-08 ENCOUNTER — Ambulatory Visit
Admission: RE | Admit: 2018-09-08 | Discharge: 2018-09-08 | Disposition: A | Discharge status: Home / Self Care | Payer: PPO | Source: Ambulatory Visit | Attending: Radiation Oncology | Admitting: Radiation Oncology

## 2018-09-08 DIAGNOSIS — C50311 Malignant neoplasm of lower-inner quadrant of right female breast: Secondary | ICD-10-CM | POA: Diagnosis not present

## 2018-09-08 DIAGNOSIS — Z170 Estrogen receptor positive status [ER+]: Secondary | ICD-10-CM | POA: Diagnosis not present

## 2018-09-09 ENCOUNTER — Ambulatory Visit: Payer: PPO

## 2018-09-10 ENCOUNTER — Ambulatory Visit
Admission: RE | Admit: 2018-09-10 | Discharge: 2018-09-10 | Disposition: A | Discharge status: Home / Self Care | Payer: PPO | Source: Ambulatory Visit | Attending: Radiation Oncology | Admitting: Radiation Oncology

## 2018-09-10 DIAGNOSIS — C50311 Malignant neoplasm of lower-inner quadrant of right female breast: Secondary | ICD-10-CM | POA: Diagnosis not present

## 2018-09-10 DIAGNOSIS — Z170 Estrogen receptor positive status [ER+]: Secondary | ICD-10-CM | POA: Diagnosis not present

## 2018-09-11 ENCOUNTER — Ambulatory Visit
Admission: RE | Admit: 2018-09-11 | Discharge: 2018-09-11 | Disposition: A | Discharge status: Home / Self Care | Payer: PPO | Source: Ambulatory Visit | Attending: Radiation Oncology | Admitting: Radiation Oncology

## 2018-09-11 DIAGNOSIS — C50311 Malignant neoplasm of lower-inner quadrant of right female breast: Secondary | ICD-10-CM | POA: Diagnosis not present

## 2018-09-12 ENCOUNTER — Ambulatory Visit
Admission: RE | Admit: 2018-09-12 | Discharge: 2018-09-12 | Disposition: A | Discharge status: Home / Self Care | Payer: PPO | Source: Ambulatory Visit | Attending: Radiation Oncology | Admitting: Radiation Oncology

## 2018-09-12 DIAGNOSIS — Z170 Estrogen receptor positive status [ER+]: Secondary | ICD-10-CM | POA: Diagnosis not present

## 2018-09-12 DIAGNOSIS — C50311 Malignant neoplasm of lower-inner quadrant of right female breast: Secondary | ICD-10-CM | POA: Diagnosis not present

## 2018-09-15 ENCOUNTER — Inpatient Hospital Stay: Payer: PPO

## 2018-09-15 ENCOUNTER — Ambulatory Visit: Payer: PPO

## 2018-09-15 ENCOUNTER — Ambulatory Visit
Admission: RE | Admit: 2018-09-15 | Discharge: 2018-09-15 | Disposition: A | Discharge status: Home / Self Care | Payer: PPO | Source: Ambulatory Visit | Attending: Radiation Oncology | Admitting: Radiation Oncology

## 2018-09-15 DIAGNOSIS — C50311 Malignant neoplasm of lower-inner quadrant of right female breast: Secondary | ICD-10-CM | POA: Diagnosis not present

## 2018-09-15 DIAGNOSIS — Z170 Estrogen receptor positive status [ER+]: Principal | ICD-10-CM

## 2018-09-15 LAB — CBC WITH DIFFERENTIAL/PLATELET
Abs Immature Granulocytes: 0.02 10*3/uL (ref 0.00–0.07)
BASOS ABS: 0 10*3/uL (ref 0.0–0.1)
Basophils Relative: 1 %
Eosinophils Absolute: 0.1 10*3/uL (ref 0.0–0.5)
Eosinophils Relative: 2 %
HEMATOCRIT: 40.3 % (ref 36.0–46.0)
Hemoglobin: 12.5 g/dL (ref 12.0–15.0)
IMMATURE GRANULOCYTES: 0 %
LYMPHS ABS: 2.7 10*3/uL (ref 0.7–4.0)
LYMPHS PCT: 33 %
MCH: 27.9 pg (ref 26.0–34.0)
MCHC: 31 g/dL (ref 30.0–36.0)
MCV: 90 fL (ref 80.0–100.0)
Monocytes Absolute: 0.7 10*3/uL (ref 0.1–1.0)
Monocytes Relative: 8 %
NEUTROS PCT: 56 %
NRBC: 0 % (ref 0.0–0.2)
Neutro Abs: 4.7 10*3/uL (ref 1.7–7.7)
Platelets: 275 10*3/uL (ref 150–400)
RBC: 4.48 MIL/uL (ref 3.87–5.11)
RDW: 14.2 % (ref 11.5–15.5)
WBC: 8.3 10*3/uL (ref 4.0–10.5)

## 2018-09-16 ENCOUNTER — Ambulatory Visit
Admission: RE | Admit: 2018-09-16 | Discharge: 2018-09-16 | Disposition: A | Discharge status: Home / Self Care | Payer: PPO | Source: Ambulatory Visit | Attending: Radiation Oncology | Admitting: Radiation Oncology

## 2018-09-16 ENCOUNTER — Ambulatory Visit: Payer: PPO

## 2018-09-16 DIAGNOSIS — Z170 Estrogen receptor positive status [ER+]: Secondary | ICD-10-CM | POA: Diagnosis not present

## 2018-09-16 DIAGNOSIS — C50311 Malignant neoplasm of lower-inner quadrant of right female breast: Secondary | ICD-10-CM | POA: Diagnosis not present

## 2018-09-17 ENCOUNTER — Ambulatory Visit: Payer: PPO

## 2018-09-17 DIAGNOSIS — Z170 Estrogen receptor positive status [ER+]: Secondary | ICD-10-CM | POA: Diagnosis not present

## 2018-09-17 DIAGNOSIS — C50311 Malignant neoplasm of lower-inner quadrant of right female breast: Secondary | ICD-10-CM | POA: Diagnosis not present

## 2018-09-18 ENCOUNTER — Ambulatory Visit: Payer: PPO

## 2018-09-18 DIAGNOSIS — C50311 Malignant neoplasm of lower-inner quadrant of right female breast: Secondary | ICD-10-CM | POA: Diagnosis not present

## 2018-09-18 DIAGNOSIS — Z170 Estrogen receptor positive status [ER+]: Secondary | ICD-10-CM | POA: Diagnosis not present

## 2018-09-19 ENCOUNTER — Ambulatory Visit: Payer: PPO

## 2018-09-19 ENCOUNTER — Ambulatory Visit
Admission: RE | Admit: 2018-09-19 | Discharge: 2018-09-19 | Disposition: A | Discharge status: Home / Self Care | Payer: PPO | Source: Ambulatory Visit | Attending: Radiation Oncology | Admitting: Radiation Oncology

## 2018-09-19 DIAGNOSIS — C50311 Malignant neoplasm of lower-inner quadrant of right female breast: Secondary | ICD-10-CM | POA: Diagnosis not present

## 2018-09-19 DIAGNOSIS — Z170 Estrogen receptor positive status [ER+]: Secondary | ICD-10-CM | POA: Diagnosis not present

## 2018-09-22 ENCOUNTER — Ambulatory Visit: Payer: PPO

## 2018-09-23 ENCOUNTER — Ambulatory Visit: Payer: PPO

## 2018-09-25 ENCOUNTER — Ambulatory Visit: Payer: PPO

## 2018-09-26 ENCOUNTER — Encounter: Payer: Self-pay | Admitting: Family Medicine

## 2018-09-26 ENCOUNTER — Ambulatory Visit: Payer: Self-pay

## 2018-09-26 ENCOUNTER — Ambulatory Visit (INDEPENDENT_AMBULATORY_CARE_PROVIDER_SITE_OTHER): Payer: PPO | Admitting: Family Medicine

## 2018-09-26 ENCOUNTER — Ambulatory Visit: Payer: PPO

## 2018-09-26 DIAGNOSIS — R05 Cough: Secondary | ICD-10-CM | POA: Diagnosis not present

## 2018-09-26 DIAGNOSIS — R059 Cough, unspecified: Secondary | ICD-10-CM | POA: Insufficient documentation

## 2018-09-26 MED ORDER — ALBUTEROL SULFATE (2.5 MG/3ML) 0.083% IN NEBU
2.5000 mg | INHALATION_SOLUTION | Freq: Once | RESPIRATORY_TRACT | Status: AC
  Administered 2018-09-26: 2.5 mg via RESPIRATORY_TRACT

## 2018-09-26 MED ORDER — PREDNISONE 20 MG PO TABS: 40.0000 mg | ORAL_TABLET | Freq: Every day | ORAL | 0 refills | Status: DC

## 2018-09-26 MED ORDER — DOXYCYCLINE HYCLATE 100 MG PO TABS: 100.0000 mg | ORAL_TABLET | Freq: Two times a day (BID) | ORAL | 0 refills | Status: DC

## 2018-09-26 NOTE — Telephone Encounter (Signed)
She needs to be evaluated for this to determine appropriate treatment. Given her reported shortness of breath I would advise evaluation today. You can offer her the 3:15 appointment for today if it is still available. If it is not available I would advise that she go to urgent care for evaluation. Thanks.

## 2018-09-26 NOTE — Assessment & Plan Note (Signed)
Differential includes viral respiratory illness versus bronchitis versus COPD exacerbation.  No focal findings to indicate pneumonia.  Patient was given an albuterol nebulizer treatment in the office.  She will be placed on doxycycline and prednisone.  She will continue her albuterol inhaler every 6 hours for the next 2 days.  If not improving she will follow-up.  Given return precautions.

## 2018-09-26 NOTE — Patient Instructions (Signed)
Nice to see you. You likely have a COPD exacerbation. We will treat you with prednisone and doxycycline. You should use your albuterol inhaler every 6 hours for the next 2 days and then as needed. If you are not improving please contact us. If you develop shortness of breath, cough productive of blood, chest pain, or fevers please be reevaluated.

## 2018-09-26 NOTE — Telephone Encounter (Signed)
Pt. Reports she started with cough and runny nose 1 week ago. Has yellow=green discharge from nose. Productive cough with yellow-green mucus.No blood in mucus. Propping up on pillows at night because of coughing. Has wheezing. Has been getting daily radiation for breast cancer. Has not been able to go this week because of symptoms. States she asked oncology to call medication in and was told to call PCP. Requesting Dr. Caryl Bis send medication to her pharmacy if possible - wants antibiotic and cough medication. Spoke with Sarah in office, and will send over Triage note for review.  Reason for Disposition . [1] Continuous (nonstop) coughing interferes with work or school AND [2] no improvement using cough treatment per Care Advice  Answer Assessment - Initial Assessment Questions 1. ONSET: "When did the cough begin?"      1 week ago 2. SEVERITY: "How bad is the cough today?"      Severe 3. RESPIRATORY DISTRESS: "Describe your breathing."       Some shortness of breath 4. FEVER: "Do you have a fever?" If so, ask: "What is your temperature, how was it measured, and when did it start?"     Maybe 5. SPUTUM: "Describe the color of your sputum" (clear, white, yellow, green)     Yellow-green 6. HEMOPTYSIS: "Are you coughing up any blood?" If so ask: "How much?" (flecks, streaks, tablespoons, etc.)     No 7. CARDIAC HISTORY: "Do you have any history of heart disease?" (e.g., heart attack, congestive heart failure)      HTN 8. LUNG HISTORY: "Do you have any history of lung disease?"  (e.g., pulmonary embolus, asthma, emphysema)     copd 9. PE RISK FACTORS: "Do you have a history of blood clots?" (or: recent major surgery, recent prolonged travel, bedridden)     No 10. OTHER SYMPTOMS: "Do you have any other symptoms?" (e.g., runny nose, wheezing, chest pain)       Wheezing, runny nose 11. PREGNANCY: "Is there any chance you are pregnant?" "When was your last menstrual period?"       n/a 12.  TRAVEL: "Have you traveled out of the country in the last month?" (e.g., travel history, exposures)       No  Protocols used: [employer]

## 2018-09-26 NOTE — Progress Notes (Signed)
[name], MD Phone: [phone]  Tammy Choi is a 72 y.o. female who presents today for same-day visit.  CC: Cough  Patient had symptoms started 5 days ago.  Started out with laryngitis symptoms with hoarseness and difficulty talking then she developed sinus congestion and pressure with blowing mucus out of her nose.  She then developed cough productive of green mucus.  She felt feverish 2 days ago.  She does feel about 75% better today though does still have significant chest congestion and wheezing.  She denies shortness of breath.  No ear discomfort.  No chest pain.  She does note some wheezing.  She is not on chemotherapy currently.  She is getting radiation therapy though has missed this week related to feeling ill.  She has been using her albuterol inhaler though last used it yesterday.  Social History   Tobacco Use  Smoking Status Former Smoker  . Packs/day: 1.00  . Years: 35.00  . Pack years: 35.00  . Types: Cigarettes  . Last attempt to quit: 12/20/2015  . Years since quitting: 2.7  Smokeless Tobacco Never Used     ROS see history of present illness  Objective  Physical Exam Vitals:   09/26/18 1538  BP: 110/78  Pulse: (!) 101  Temp: 97.9 F (36.6 C)  SpO2: 94%   Oxygen saturation after nebulizer treatment 97%, ambulatory oxygen saturation after nebulizer 92%  BP Readings from Last 3 Encounters:  09/26/18 110/78  07/29/18 106/82  07/22/18 134/81   Wt Readings from Last 3 Encounters:  09/26/18 188 lb 6.4 oz (85.5 kg)  07/29/18 187 lb 9.6 oz (85.1 kg)  07/22/18 186 lb 12.8 oz (84.7 kg)    Physical Exam Constitutional:      General: She is not in acute distress.    Appearance: She is not diaphoretic.  HENT:     Right Ear: Tympanic membrane normal.     Left Ear: Tympanic membrane normal.     Mouth/Throat:     Mouth: Mucous membranes are moist.     Pharynx: Oropharynx is clear.  Eyes:     Conjunctiva/sclera: Conjunctivae normal.   Pupils: Pupils are equal, round, and reactive to light.  Cardiovascular:     Rate and Rhythm: Normal rate and regular rhythm.     Heart sounds: Normal heart sounds.  Pulmonary:     Effort: Pulmonary effort is normal. No respiratory distress.     Breath sounds: No stridor. Wheezing (Scattered expiratory wheezes with diminished air movement) present. No rhonchi or rales.  Skin:    General: Skin is warm and dry.  Neurological:     Mental Status: She is alert.   Albuterol nebulizer given to the patient.  She feels improved after this and feels as though she is moving better air.  On exam she has better air movement and no wheezing.   Assessment/Plan: Please see individual problem list.  Cough Differential includes viral respiratory illness versus bronchitis versus COPD exacerbation.  No focal findings to indicate pneumonia.  Patient was given an albuterol nebulizer treatment in the office.  She will be placed on doxycycline and prednisone.  She will continue her albuterol inhaler every 6 hours for the next 2 days.  If not improving she will follow-up.  Given return precautions.   No orders of the defined types were placed in this encounter.   Meds ordered this encounter  Medications  . doxycycline (VIBRA-TABS) 100 MG tablet    Sig: Take 1 tablet (100  mg total) by mouth 2 (two) times daily.    Dispense:  14 tablet    Refill:  0  . predniSONE (DELTASONE) 20 MG tablet    Sig: Take 2 tablets (40 mg total) by mouth daily with breakfast.    Dispense:  10 tablet    Refill:  0  . albuterol (PROVENTIL) (2.5 MG/3ML) 0.083% nebulizer solution 2.5 mg     [name], MD [place]

## 2018-09-26 NOTE — Telephone Encounter (Signed)
Sent to PCP to advise. Thanks  

## 2018-09-26 NOTE — Telephone Encounter (Signed)
Called and spoke with patient. Pt is agreable to coming in today for 3:15 Appt send to [facility] to schedule same day appt. Thanks

## 2018-09-26 NOTE — Telephone Encounter (Signed)
scheduled

## 2018-09-29 ENCOUNTER — Ambulatory Visit
Admission: RE | Admit: 2018-09-29 | Discharge: 2018-09-29 | Disposition: A | Discharge status: Home / Self Care | Payer: PPO | Source: Ambulatory Visit | Attending: Radiation Oncology | Admitting: Radiation Oncology

## 2018-09-29 ENCOUNTER — Other Ambulatory Visit: Payer: Self-pay

## 2018-09-29 ENCOUNTER — Inpatient Hospital Stay (HOSPITAL_BASED_OUTPATIENT_CLINIC_OR_DEPARTMENT_OTHER): Payer: PPO | Admitting: Oncology

## 2018-09-29 ENCOUNTER — Ambulatory Visit: Payer: PPO

## 2018-09-29 ENCOUNTER — Encounter: Payer: Self-pay | Admitting: Oncology

## 2018-09-29 VITALS — BP 117/70 | HR 90 | Temp 96.9°F | Resp 18 | Wt 187.6 lb

## 2018-09-29 DIAGNOSIS — C50311 Malignant neoplasm of lower-inner quadrant of right female breast: Secondary | ICD-10-CM | POA: Diagnosis not present

## 2018-09-29 DIAGNOSIS — Z803 Family history of malignant neoplasm of breast: Secondary | ICD-10-CM

## 2018-09-29 DIAGNOSIS — J209 Acute bronchitis, unspecified: Secondary | ICD-10-CM

## 2018-09-29 DIAGNOSIS — Z170 Estrogen receptor positive status [ER+]: Secondary | ICD-10-CM

## 2018-09-29 DIAGNOSIS — Z79811 Long term (current) use of aromatase inhibitors: Secondary | ICD-10-CM

## 2018-09-29 MED ORDER — ANASTROZOLE 1 MG PO TABS: 1.0000 mg | ORAL_TABLET | Freq: Every day | ORAL | 3 refills | Status: DC

## 2018-09-29 NOTE — Progress Notes (Signed)
Hematology/Oncology follow up note [facility] [phone] Fax:([phone]) [phone]   Patient Care Team: Leone Haven, MD as PCP - General (Family Medicine) Bary Castilla, [name], MD (General Surgery)  REFERRING PROVIDER: Dr. Bary Castilla REASON FOR VISIT:  Evaluation of breast cancer  HISTORY OF PRESENTING ILLNESS:  Tammy Choi is a  72 y.o.  female with PMH listed below who was referred to me for evaluation of newly diagnosed breast cancer. Patient had mammogram 05/01/2018 which showed indeterminate microcalcification in the medial right breast measuring 0.9 x 0.7 x 0.7 cm Biopsy pathology showed: Invasive mammary carcinoma, DCIS, calcification associated with DCIS, grade 2, ER positive PR negative, HER-2 negative (IHC 1+)  Nipple discharge: Denies Family history: Sister passed away from breast cancer History of radiation to chest: denies.  Previous breast surgery: Left breast biopsy with benign etiology.  Patient was accompanied by multiple family members, including daughter and granddaughter.  INTERVAL HISTORY Tammy Choi is a 72 y.o. female who has above history reviewed by me today presents for follow up visit for management of Stage IA Breast cancer, Estrogen receptor positive, HER 2 negative.  S/p lumpectomy and sentinel lymph node dissection.  Currently undergoing adjuvant RT.   Had acute bronchitis last week and was prescribed with antibiotics and Steroid tapering course. RT was held last week.  She has 6 more treatments to complete.  Reports respiratory symptoms have improved, thought still have cough and wheezing. Denies any fever, chills,    Review of Systems  Constitutional: Negative for chills, fever, malaise/fatigue and weight loss.  HENT: Negative for nosebleeds and sore throat.   Eyes: Negative for double vision, photophobia and redness.  Respiratory: Positive for cough and wheezing. Negative for shortness of breath.     Cardiovascular: Negative for chest pain, palpitations and orthopnea.  Gastrointestinal: Negative for abdominal pain, blood in stool, nausea and vomiting.  Genitourinary: Negative for dysuria.  Musculoskeletal: Negative for back pain, myalgias and neck pain.  Skin: Negative for itching and rash.  Neurological: Negative for dizziness, tingling and tremors.  Endo/Heme/Allergies: Negative for environmental allergies. Does not bruise/bleed easily.  Psychiatric/Behavioral: Negative for depression. The patient is nervous/anxious.     MEDICAL HISTORY:  Past Medical History:  Diagnosis Date  . Breast cancer ([place]) 05/07/2018   T1a, N0; ER/ PR positive, Her 2 neu not overexpressed.   Marland Kitchen COPD (chronic obstructive pulmonary disease) (HCC)    Not on home o2  . Depression   . HTN (hypertension)   . Hyperlipemia     SURGICAL HISTORY: Past Surgical History:  Procedure Laterality Date  . ABDOMINAL HYSTERECTOMY    . BREAST BIOPSY Left 04/30/2013   neg core  . BREAST BIOPSY Right 05/07/2018   right breast stereo x clip INVASIVE MAMMARY CARCINOMA  . BREAST LUMPECTOMY WITH SENTINEL LYMPH NODE BIOPSY Right 06/16/2018   Procedure: BREAST LUMPECTOMY WITH SENTINEL LYMPH NODE BX;  Surgeon: Robert Bellow, MD;  Location: [facility] ORS;  Service: General;  Laterality: Right;  . COLONOSCOPY  2014   Dr Bary Castilla  . Left Leg Surgery    . TONSILLECTOMY      SOCIAL HISTORY: Social History   Socioeconomic History  . Marital status: Single    Spouse name: Not on file  . Number of children: Not on file  . Years of education: Not on file  . Highest education level: Not on file  Occupational History  . 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  Financial resource strain: Not on file  . Food insecurity:    Worry: Not on file    Inability: Not on file  . Transportation needs:    Medical: Not on file    Non-medical: Not on file  Tobacco Use  . Smoking status: Former  Smoker    Packs/day: 1.00    Years: 35.00    Pack years: 35.00    Types: Cigarettes    Last attempt to quit: 12/20/2015    Years since quitting: 2.7  . Smokeless tobacco: Never Used  Substance and Sexual Activity  . Alcohol use: No    Alcohol/week: 0.0 standard drinks  . Drug use: No  . Sexual activity: Not on file  Lifestyle  . Physical activity:    Days per week: Not on file    Minutes per session: Not on file  . Stress: Not on file  Relationships  . Social connections:    Talks on phone: Not on file    Gets together: Not on file    Attends religious service: Not on file    Active member of club or organization: Not on file    Attends meetings of clubs or organizations: Not on file    Relationship status: Not on file  . Intimate partner violence:    Fear of current or ex partner: Not on file    Emotionally abused: Not on file    Physically abused: Not on file    Forced sexual activity: Not on file  Other Topics Concern  . Not on file  Social History Narrative   Lives with daughter at home. Independent at baseline.    FAMILY HISTORY: Family History  Problem Relation Age of Onset  . Kidney disease Mother        deceased 58  . Heart disease Father        deceased 22  . Breast cancer Other 51       maternal half-sister; deceased 95  . Colon cancer Neg Hx     ALLERGIES:  is allergic to penicillins.  MEDICATIONS:  Current Outpatient Medications  Medication Sig Dispense Refill  . acetaminophen (TYLENOL) 500 MG tablet Take 500-1,000 mg by mouth 2 (two) times daily as needed for moderate pain or headache.    . albuterol (PROAIR HFA) 108 (90 Base) MCG/ACT inhaler Inhale 2 puffs into the lungs every 6 (six) hours as needed for wheezing or shortness of breath. 1 Inhaler 2  . budesonide-formoterol (SYMBICORT) 160-4.5 MCG/ACT inhaler Inhale 2 puffs into the lungs 2 (two) times daily. 1 Inhaler 5  . cholecalciferol (VITAMIN D) 1000 UNITS tablet Take 2,000 Units by mouth  every other day.     Marland Kitchen doxycycline (VIBRA-TABS) 100 MG tablet Take 1 tablet (100 mg total) by mouth 2 (two) times daily. 14 tablet 0  . FLUoxetine (PROZAC) 40 MG capsule Take 1 capsule (40 mg total) by mouth at bedtime. 90 capsule 3  . hydrochlorothiazide (HYDRODIURIL) 12.5 MG tablet Take 1 tablet (12.5 mg total) by mouth daily. 90 tablet 1  . lisinopril (PRINIVIL,ZESTRIL) 40 MG tablet TAKE 1 TABLET BY MOUTH ONCE DAILY (Patient taking differently: Take 40 mg by mouth at bedtime. ) 90 tablet 3  . Multiple Vitamins-Minerals (MULTIVITAMIN WITH MINERALS) tablet Take 1 tablet by mouth every other day.     . predniSONE (DELTASONE) 20 MG tablet Take 2 tablets (40 mg total) by mouth daily with breakfast. 10 tablet 0  . rosuvastatin (CRESTOR) 5 MG tablet TAKE 1  TABLET BY MOUTH ONCE DAILY 90 tablet 3  . [START ON 10/20/2018] anastrozole (ARIMIDEX) 1 MG tablet Take 1 tablet (1 mg total) by mouth daily. 30 tablet 3   No current facility-administered medications for this visit.      PHYSICAL EXAMINATION: ECOG PERFORMANCE STATUS: 0 - Asymptomatic Vitals:   09/29/18 1409  BP: 117/70  Pulse: 90  Resp: 18  Temp: (!) 96.9 F (36.1 C)   Filed Weights   09/29/18 1409  Weight: 187 lb 9.6 oz (85.1 kg)    Physical Exam Constitutional:      General: She is not in acute distress. HENT:     Head: Normocephalic and atraumatic.  Eyes:     General: No scleral icterus.    Pupils: Pupils are equal, round, and reactive to light.  Neck:     Musculoskeletal: Normal range of motion and neck supple.  Cardiovascular:     Rate and Rhythm: Normal rate and regular rhythm.     Heart sounds: Normal heart sounds.  Pulmonary:     Effort: Pulmonary effort is normal. No respiratory distress.     Breath sounds: No wheezing.  Abdominal:     General: Bowel sounds are normal. There is no distension.     Palpations: Abdomen is soft. There is no mass.     Tenderness: There is no abdominal tenderness.  Musculoskeletal:  Normal range of motion.        General: No deformity.  Skin:    General: Skin is warm and dry.     Findings: No erythema or rash.  Neurological:     Mental Status: She is alert and oriented to person, place, and time.     Cranial Nerves: No cranial nerve deficit.     Coordination: Coordination normal.  Psychiatric:        Behavior: Behavior normal.        Thought Content: Thought content normal.     LABORATORY DATA:  I have reviewed the data as listed Lab Results  Component Value Date   WBC 8.3 09/15/2018   HGB 12.5 09/15/2018   HCT 40.3 09/15/2018   MCV 90.0 09/15/2018   PLT 275 09/15/2018   Recent Labs    03/14/18 1459 08/01/18 1458  NA 140 138  K 4.9 4.8  CL 102 103  CO2 24 27  GLUCOSE 91 86  BUN 22 35*  CREATININE 0.89 0.91  CALCIUM 10.0 9.1  PROT 7.0  --   AST 16  --   ALT 13  --   BILITOT 0.4  --    Iron/TIBC/Ferritin/ %Sat No results found for: IRON, TIBC, FERRITIN, IRONPCTSAT      ASSESSMENT & PLAN:  1. Malignant neoplasm of lower-inner quadrant of right breast of female, estrogen receptor positive ([place])   2. Family history of breast cancer   3. Aromatase inhibitor use   Cancer Staging Malignant neoplasm of lower-inner quadrant of right breast of female, estrogen receptor positive ([place]) Staging form: Breast, AJCC 8th Edition - Clinical: No stage assigned - Unsigned - Pathologic stage from 09/29/2018: Stage IA (pT1a, pN0, cM0, G2, ER+, PR-, HER2-) - Signed by Earlie Server, MD on 09/29/2018  Stage IA breast cancer ER positive, PR negative, HER-2 negative. Pathology was discussed with patient.  Advise patient to finish her antibiotic course and steroid tapering course for acute bronchitis treatment.  Finished adjuvant RT.   Discussed with patient abut rational of adjuvant anti estrogen therapy with aromatase inhibitor Arimidex 40m daily.  Rationale of using aromatase inhibitor -arimidex discussed with patient.  Side effects including but not limited  to hot flush, joint pain, fatigue, mood swing, osteoporosis discussed with patient. Patient voices understanding and willing to proceed.   Rx was sent to pharmacy. Advise patient to take 1 week break after finished RT, start around 10/20/2017.  Follow up in 4 weeks, check cbc, cmp.   All questions were answered. The patient knows to call the clinic with any problems questions or concerns. Return of visit: Mid February   Earlie Server, MD, PhD Hematology Oncology [facility] at [employer] Pager- [phone] 09/29/2018

## 2018-09-29 NOTE — Progress Notes (Signed)
Patient here for follow up. Pt on antibiotic for upper respiratory infection/ bronchitis. Patient missed radiation last week due to being sick.

## 2018-09-30 ENCOUNTER — Ambulatory Visit: Payer: PPO

## 2018-09-30 ENCOUNTER — Ambulatory Visit
Admission: RE | Admit: 2018-09-30 | Discharge: 2018-09-30 | Disposition: A | Discharge status: Home / Self Care | Payer: PPO | Source: Ambulatory Visit | Attending: Radiation Oncology | Admitting: Radiation Oncology

## 2018-09-30 DIAGNOSIS — Z170 Estrogen receptor positive status [ER+]: Secondary | ICD-10-CM | POA: Diagnosis not present

## 2018-09-30 DIAGNOSIS — C50311 Malignant neoplasm of lower-inner quadrant of right female breast: Secondary | ICD-10-CM | POA: Diagnosis not present

## 2018-10-02 ENCOUNTER — Ambulatory Visit: Payer: PPO

## 2018-10-02 ENCOUNTER — Ambulatory Visit
Admission: RE | Admit: 2018-10-02 | Discharge: 2018-10-02 | Disposition: A | Discharge status: Home / Self Care | Payer: PPO | Source: Ambulatory Visit | Attending: Radiation Oncology | Admitting: Radiation Oncology

## 2018-10-02 DIAGNOSIS — C50311 Malignant neoplasm of lower-inner quadrant of right female breast: Secondary | ICD-10-CM | POA: Diagnosis not present

## 2018-10-02 DIAGNOSIS — Z170 Estrogen receptor positive status [ER+]: Secondary | ICD-10-CM | POA: Insufficient documentation

## 2018-10-03 ENCOUNTER — Ambulatory Visit: Payer: PPO

## 2018-10-03 ENCOUNTER — Ambulatory Visit
Admission: RE | Admit: 2018-10-03 | Discharge: 2018-10-03 | Disposition: A | Discharge status: Home / Self Care | Payer: PPO | Source: Ambulatory Visit | Attending: Radiation Oncology | Admitting: Radiation Oncology

## 2018-10-03 DIAGNOSIS — C50311 Malignant neoplasm of lower-inner quadrant of right female breast: Secondary | ICD-10-CM | POA: Diagnosis not present

## 2018-10-06 ENCOUNTER — Ambulatory Visit: Payer: PPO

## 2018-10-06 ENCOUNTER — Ambulatory Visit
Admission: RE | Admit: 2018-10-06 | Discharge: 2018-10-06 | Disposition: A | Discharge status: Home / Self Care | Payer: PPO | Source: Ambulatory Visit | Attending: Radiation Oncology | Admitting: Radiation Oncology

## 2018-10-06 DIAGNOSIS — C50311 Malignant neoplasm of lower-inner quadrant of right female breast: Secondary | ICD-10-CM | POA: Diagnosis not present

## 2018-10-07 ENCOUNTER — Ambulatory Visit: Payer: PPO

## 2018-10-07 ENCOUNTER — Ambulatory Visit
Admission: RE | Admit: 2018-10-07 | Discharge: 2018-10-07 | Disposition: A | Discharge status: Home / Self Care | Payer: PPO | Source: Ambulatory Visit | Attending: Radiation Oncology | Admitting: Radiation Oncology

## 2018-10-07 DIAGNOSIS — C50311 Malignant neoplasm of lower-inner quadrant of right female breast: Secondary | ICD-10-CM | POA: Diagnosis not present

## 2018-10-08 ENCOUNTER — Ambulatory Visit: Payer: PPO

## 2018-10-08 ENCOUNTER — Ambulatory Visit
Admission: RE | Admit: 2018-10-08 | Discharge: 2018-10-08 | Disposition: A | Discharge status: Home / Self Care | Payer: PPO | Source: Ambulatory Visit | Attending: Radiation Oncology | Admitting: Radiation Oncology

## 2018-10-08 DIAGNOSIS — Z170 Estrogen receptor positive status [ER+]: Secondary | ICD-10-CM | POA: Diagnosis not present

## 2018-10-08 DIAGNOSIS — C50311 Malignant neoplasm of lower-inner quadrant of right female breast: Secondary | ICD-10-CM | POA: Diagnosis not present

## 2018-10-09 ENCOUNTER — Ambulatory Visit
Admission: RE | Admit: 2018-10-09 | Discharge: 2018-10-09 | Disposition: A | Discharge status: Home / Self Care | Payer: PPO | Source: Ambulatory Visit | Attending: Radiation Oncology | Admitting: Radiation Oncology

## 2018-10-09 DIAGNOSIS — C50311 Malignant neoplasm of lower-inner quadrant of right female breast: Secondary | ICD-10-CM | POA: Diagnosis not present

## 2018-10-10 ENCOUNTER — Encounter: Payer: Self-pay | Admitting: Family Medicine

## 2018-10-10 ENCOUNTER — Ambulatory Visit (INDEPENDENT_AMBULATORY_CARE_PROVIDER_SITE_OTHER): Payer: PPO | Admitting: Family Medicine

## 2018-10-10 ENCOUNTER — Ambulatory Visit: Payer: PPO

## 2018-10-10 DIAGNOSIS — E785 Hyperlipidemia, unspecified: Secondary | ICD-10-CM

## 2018-10-10 DIAGNOSIS — R05 Cough: Secondary | ICD-10-CM

## 2018-10-10 DIAGNOSIS — R059 Cough, unspecified: Secondary | ICD-10-CM

## 2018-10-10 DIAGNOSIS — Z170 Estrogen receptor positive status [ER+]: Secondary | ICD-10-CM

## 2018-10-10 DIAGNOSIS — C50311 Malignant neoplasm of lower-inner quadrant of right female breast: Secondary | ICD-10-CM | POA: Diagnosis not present

## 2018-10-10 DIAGNOSIS — I10 Essential (primary) hypertension: Secondary | ICD-10-CM

## 2018-10-10 DIAGNOSIS — F325 Major depressive disorder, single episode, in full remission: Secondary | ICD-10-CM | POA: Diagnosis not present

## 2018-10-10 MED ORDER — TETANUS-DIPHTH-ACELL PERTUSSIS 5-2.5-18.5 LF-MCG/0.5 IM SUSP: 0.5000 mL | Freq: Once | INTRAMUSCULAR | 0 refills | Status: AC

## 2018-10-10 NOTE — Progress Notes (Signed)
  [name], MD Phone: [phone]  Tammy Choi is a 73 y.o. female who presents today for follow-up.  CC: Hypertension, hyperlipidemia, breast cancer, cough, depression  Hypertension: Well-controlled per her report.  No chest pain, shortness with, or edema.  She is taking HCTZ and lisinopril.  Hyperlipidemia: Taking Crestor.  No right upper quadrant pain or myalgias.  Breast cancer: Recently finished radiation.  She does have some fatigue related to this.  She is going to start on  anastrozole later this month.  She is followed by general surgery and oncology.  Cough: Patient previously treated with prednisone and doxycycline.  She notes symptoms resolved.  Depression: She notes she is doing fairly well with this.  No depression.  She does note some sadness as she lost 2 family members recently.  She continues on Prozac.  Social History   Tobacco Use  Smoking Status Former Smoker  . Packs/day: 1.00  . Years: 35.00  . Pack years: 35.00  . Types: Cigarettes  . Last attempt to quit: 12/20/2015  . Years since quitting: 2.8  Smokeless Tobacco Never Used     ROS see history of present illness  Objective  Physical Exam Vitals:   10/10/18 1427  BP: 120/68  Pulse: 82  Temp: 98.4 F (36.9 C)  SpO2: 95%    BP Readings from Last 3 Encounters:  10/10/18 120/68  09/29/18 117/70  09/26/18 110/78   Wt Readings from Last 3 Encounters:  10/10/18 185 lb (83.9 kg)  09/29/18 187 lb 9.6 oz (85.1 kg)  09/26/18 188 lb 6.4 oz (85.5 kg)    Physical Exam Constitutional:      General: She is not in acute distress.    Appearance: She is not diaphoretic.  Cardiovascular:     Rate and Rhythm: Normal rate and regular rhythm.     Heart sounds: Normal heart sounds.  Pulmonary:     Effort: Pulmonary effort is normal.     Breath sounds: Normal breath sounds.  Musculoskeletal:     Right lower leg: No edema.     Left lower leg: No edema.  Skin:    General: Skin is warm  and dry.  Neurological:     Mental Status: She is alert.      Assessment/Plan: Please see individual problem list.  Hypertension Well-controlled.  Continue current regimen.  Recent labs reviewed.  Cough Symptoms have resolved.  She will monitor for recurrence.  Depression Well-controlled.  Continue current medication.  Malignant neoplasm of lower-inner quadrant of right breast of female, estrogen receptor positive ([place]) Recently finished radiation.  She will continue to see general surgery and oncology.  Discussed that the fatigue may be related to the radiation and will likely progressively improve.  Hyperlipidemia Continue current medication.   Health Maintenance: Tdap vaccine sent to pharmacy.  Patient advised to get this there.  She defers hepatitis C screening until her next lab work.  No orders of the defined types were placed in this encounter.   No orders of the defined types were placed in this encounter.    [name], MD [place]

## 2018-10-10 NOTE — Assessment & Plan Note (Signed)
Symptoms have resolved.  She will monitor for recurrence. 

## 2018-10-10 NOTE — Assessment & Plan Note (Signed)
Continue current medication.

## 2018-10-10 NOTE — Assessment & Plan Note (Signed)
Recently finished radiation.  She will continue to see general surgery and oncology.  Discussed that the fatigue may be related to the radiation and will likely progressively improve.

## 2018-10-10 NOTE — Assessment & Plan Note (Signed)
Well-controlled.  Continue current regimen.  Recent labs reviewed.

## 2018-10-10 NOTE — Assessment & Plan Note (Signed)
Well controlled. Continue current medication.  

## 2018-10-10 NOTE — Patient Instructions (Signed)
Nice to see you. We will see you back in 4 months.

## 2018-10-14 ENCOUNTER — Telehealth: Payer: Self-pay | Admitting: *Deleted

## 2018-10-14 NOTE — Telephone Encounter (Signed)
Patient want to wait to see Dr. Bary Castilla. Will call her back to reschedule.

## 2018-10-23 ENCOUNTER — Ambulatory Visit: Payer: PPO | Admitting: General Surgery

## 2018-10-29 ENCOUNTER — Encounter: Payer: Self-pay | Admitting: Oncology

## 2018-11-06 ENCOUNTER — Ambulatory Visit: Payer: PPO | Admitting: General Surgery

## 2018-11-17 ENCOUNTER — Inpatient Hospital Stay: Payer: PPO | Admitting: Oncology

## 2018-11-17 ENCOUNTER — Inpatient Hospital Stay: Payer: PPO

## 2018-11-17 ENCOUNTER — Ambulatory Visit: Payer: PPO | Admitting: Radiation Oncology

## 2018-11-18 ENCOUNTER — Ambulatory Visit (INDEPENDENT_AMBULATORY_CARE_PROVIDER_SITE_OTHER): Payer: PPO

## 2018-11-18 ENCOUNTER — Ambulatory Visit (INDEPENDENT_AMBULATORY_CARE_PROVIDER_SITE_OTHER): Payer: PPO | Admitting: Family Medicine

## 2018-11-18 DIAGNOSIS — H00015 Hordeolum externum left lower eyelid: Secondary | ICD-10-CM

## 2018-11-18 DIAGNOSIS — R058 Other specified cough: Secondary | ICD-10-CM

## 2018-11-18 DIAGNOSIS — R05 Cough: Secondary | ICD-10-CM | POA: Diagnosis not present

## 2018-11-18 DIAGNOSIS — J441 Chronic obstructive pulmonary disease with (acute) exacerbation: Secondary | ICD-10-CM

## 2018-11-18 LAB — POC INFLUENZA A&B (BINAX/QUICKVUE)
INFLUENZA A, POC: NEGATIVE
Influenza B, POC: NEGATIVE

## 2018-11-18 MED ORDER — ALBUTEROL SULFATE (2.5 MG/3ML) 0.083% IN NEBU
2.5000 mg | INHALATION_SOLUTION | Freq: Once | RESPIRATORY_TRACT | Status: AC
  Administered 2018-11-18: 2.5 mg via RESPIRATORY_TRACT

## 2018-11-18 MED ORDER — IPRATROPIUM BROMIDE 0.02 % IN SOLN
0.5000 mg | Freq: Once | RESPIRATORY_TRACT | Status: AC
  Administered 2018-11-18: 0.5 mg via RESPIRATORY_TRACT

## 2018-11-18 MED ORDER — METHYLPREDNISOLONE ACETATE 80 MG/ML IJ SUSP
80.0000 mg | Freq: Once | INTRAMUSCULAR | Status: AC
  Administered 2018-11-18: 80 mg via INTRAMUSCULAR

## 2018-11-18 MED ORDER — AZITHROMYCIN 250 MG PO TABS: ORAL_TABLET | ORAL | 0 refills | Status: DC

## 2018-11-18 MED ORDER — GUAIFENESIN ER 600 MG PO TB12: 1200.0000 mg | ORAL_TABLET | Freq: Two times a day (BID) | ORAL | 0 refills | Status: DC

## 2018-11-18 MED ORDER — PREDNISONE 10 MG (21) PO TBPK: ORAL_TABLET | ORAL | 0 refills | Status: DC

## 2018-11-18 NOTE — Progress Notes (Signed)
Subjective:    Patient ID: Tammy Choi, female    DOB: 06/13/1946, 73 y.o.   MRN: [id number]  HPI   Patient presents to clinic complaining of chest congestion, harsh cough, shortness of breath, thick green mucus produced with cough that began last week.  She uses her albuterol inhaler as needed, has been using it at least once or twice a day over the last week, takes her Symbicort inhaler twice per day every day.  Denies fever or chills.  Denies chest pain.  Denies palpitations.  Denies any known flu exposure.  Patient Active Problem List   Diagnosis Date Noted  . Cough 09/26/2018  . Malignant neoplasm of lower-inner quadrant of right breast of female, estrogen receptor positive ([place]) 05/14/2018  . Bilateral lower extremity edema 01/09/2017  . Prediabetes 12/16/2016  . Actinic keratoses 10/09/2016  . Myalgia 07/10/2016  . Left wrist pain 07/21/2015  . Anxiety state 07/21/2015  . Obesity (BMI 30-39.9) 07/30/2014  . Dyspnea on exertion 03/30/2014  . COPD (chronic obstructive pulmonary disease) ([place]) 01/21/2013  . Breast mass, left 09/05/2012  . Chronic back pain 01/15/2012  . Depression 01/15/2012  . Hyperlipidemia 01/15/2012  . Hypertension 08/07/2011   Social History   Tobacco Use  . Smoking status: Former Smoker    Packs/day: 1.00    Years: 35.00    Pack years: 35.00    Types: Cigarettes    Last attempt to quit: 12/20/2015    Years since quitting: 2.9  . Smokeless tobacco: Never Used  Substance Use Topics  . Alcohol use: No    Alcohol/week: 0.0 standard drinks    Review of Systems  Constitutional: Negative for chills, and fever. +fatigue HENT: Negative for congestion, ear pain, sinus pain and sore throat.   Eyes: Negative.   Respiratory: +cough, chest congestion, shortness of breath and wheezing.   Cardiovascular: Negative for chest pain, palpitations and leg swelling.  Gastrointestinal: Negative for abdominal pain, diarrhea, nausea and vomiting.    Genitourinary: Negative for dysuria, frequency and urgency.  Musculoskeletal: Negative for arthralgias and myalgias.  Skin: Negative for color change, pallor and rash.  Neurological: Negative for syncope, light-headedness and headaches.  Psychiatric/Behavioral: The patient is not nervous/anxious.       Objective:   Physical Exam Vitals signs and nursing note reviewed.  Constitutional:      Appearance: She is not toxic-appearing.  HENT:     Head: Normocephalic.     Right Ear: Tympanic membrane, ear canal and external ear normal.     Left Ear: Tympanic membrane, ear canal and external ear normal.     Nose: Nose normal.     Mouth/Throat:     Mouth: Mucous membranes are moist.     Pharynx: No oropharyngeal exudate or posterior oropharyngeal erythema.  Eyes:     General: No scleral icterus.    Extraocular Movements: Extraocular movements intact.     Conjunctiva/sclera: Conjunctivae normal.  Neck:     Musculoskeletal: Neck supple. No neck rigidity.  Cardiovascular:     Rate and Rhythm: Normal rate and regular rhythm.  Pulmonary:     Effort: Pulmonary effort is normal.     Breath sounds: Wheezing and rhonchi present.     Comments: Breath sounds diminished throughout, harsh wet cough Lymphadenopathy:     Cervical: No cervical adenopathy.  Skin:    General: Skin is warm and dry.     Coloration: Skin is not jaundiced or pale.  Neurological:     Mental  Status: She is alert and oriented to person, place, and time.  Psychiatric:        Mood and Affect: Mood normal.        Behavior: Behavior normal.     Today's Vitals   11/18/18 1116 11/18/18 1139  BP: 98/60   Resp: 20   Temp: 98.6 F (37 C)   TempSrc: Oral   SpO2: (!) 88% 92%  Weight: 184 lb (83.5 kg)   Height: 5\' 2"  (1.575 m)    Body mass index is 33.65 kg/m.     Assessment & Plan:   COPD exacerbation/cough productive of purulent sputum-due to patient's physical exam as well as cough producing thick green sputum we  will treat with azithromycin course.  I would first suggest with doxycycline or Levaquin however patient declines and would prefer to start with azithromycin.    DuoNeb treatment and IM methylprednisolone 80 mg given in clinic.  She will also take oral steroid taper.  We will do chest x-ray in office.  She will continue inhalers as already prescribed at home. Offered to send her home with nebulizer machine and Rx for neb solution, she declines.  Administrations This Visit    albuterol (PROVENTIL) (2.5 MG/3ML) 0.083% nebulizer solution 2.5 mg    Admin Date 11/18/2018 Action Given Dose 2.5 mg Route Nebulization Administered By Neta Ehlers, RMA       ipratropium (ATROVENT) nebulizer solution 0.5 mg    Admin Date 11/18/2018 Action Given Dose 0.5 mg Route Nebulization Administered By Neta Ehlers, RMA       methylPREDNISolone acetate (DEPO-MEDROL) injection 80 mg    Admin Date 11/18/2018 Action Given Dose 80 mg Route Intramuscular Administered By Neta Ehlers, RMA         Strict return precautions given, advised that if her breathing worsens, and is not helped by use of albuterol inhaler she needs to call office and or go to emergency room right away for evaluation.  We will have her follow-up at the end of this week to be sure she is improving.

## 2018-11-20 ENCOUNTER — Ambulatory Visit: Payer: PPO | Admitting: General Surgery

## 2018-11-20 MED ORDER — POLYMYXIN B-TRIMETHOPRIM 10000-0.1 UNIT/ML-% OP SOLN: 1.0000 [drp] | OPHTHALMIC | 0 refills | Status: DC

## 2018-11-20 NOTE — Progress Notes (Unsigned)
Patient ID: Tammy Choi, female   DOB: 03/03/1946, 73 y.o.   MRN: [id number]  Eye drops sent in

## 2018-11-26 ENCOUNTER — Encounter: Payer: Self-pay | Admitting: Oncology

## 2018-11-26 ENCOUNTER — Ambulatory Visit
Admission: RE | Admit: 2018-11-26 | Discharge: 2018-11-26 | Disposition: A | Discharge status: Home / Self Care | Payer: PPO | Source: Ambulatory Visit | Attending: Radiation Oncology | Admitting: Radiation Oncology

## 2018-11-26 ENCOUNTER — Inpatient Hospital Stay (HOSPITAL_BASED_OUTPATIENT_CLINIC_OR_DEPARTMENT_OTHER): Payer: PPO | Admitting: Oncology

## 2018-11-26 ENCOUNTER — Inpatient Hospital Stay: Payer: PPO | Attending: Oncology

## 2018-11-26 ENCOUNTER — Other Ambulatory Visit: Payer: Self-pay

## 2018-11-26 VITALS — BP 131/70 | HR 99 | Temp 97.1°F | Resp 22 | Wt 180.7 lb

## 2018-11-26 VITALS — BP 108/68 | HR 96 | Temp 97.7°F | Resp 18 | Wt 180.8 lb

## 2018-11-26 DIAGNOSIS — Z170 Estrogen receptor positive status [ER+]: Secondary | ICD-10-CM | POA: Insufficient documentation

## 2018-11-26 DIAGNOSIS — C50311 Malignant neoplasm of lower-inner quadrant of right female breast: Secondary | ICD-10-CM

## 2018-11-26 DIAGNOSIS — Z79811 Long term (current) use of aromatase inhibitors: Secondary | ICD-10-CM | POA: Insufficient documentation

## 2018-11-26 DIAGNOSIS — N179 Acute kidney failure, unspecified: Secondary | ICD-10-CM

## 2018-11-26 DIAGNOSIS — N3000 Acute cystitis without hematuria: Secondary | ICD-10-CM | POA: Insufficient documentation

## 2018-11-26 DIAGNOSIS — Z923 Personal history of irradiation: Secondary | ICD-10-CM | POA: Insufficient documentation

## 2018-11-26 DIAGNOSIS — Z803 Family history of malignant neoplasm of breast: Secondary | ICD-10-CM

## 2018-11-26 LAB — CBC WITH DIFFERENTIAL/PLATELET
Abs Immature Granulocytes: 0.38 10*3/uL — ABNORMAL HIGH (ref 0.00–0.07)
BASOS PCT: 0 %
Basophils Absolute: 0 10*3/uL (ref 0.0–0.1)
Eosinophils Absolute: 0.1 10*3/uL (ref 0.0–0.5)
Eosinophils Relative: 1 %
HEMATOCRIT: 40.4 % (ref 36.0–46.0)
Hemoglobin: 12.6 g/dL (ref 12.0–15.0)
IMMATURE GRANULOCYTES: 4 %
LYMPHS ABS: 2.2 10*3/uL (ref 0.7–4.0)
Lymphocytes Relative: 20 %
MCH: 27.8 pg (ref 26.0–34.0)
MCHC: 31.2 g/dL (ref 30.0–36.0)
MCV: 89.2 fL (ref 80.0–100.0)
MONO ABS: 0.5 10*3/uL (ref 0.1–1.0)
MONOS PCT: 5 %
NEUTROS ABS: 7.8 10*3/uL — AB (ref 1.7–7.7)
NEUTROS PCT: 70 %
PLATELETS: 225 10*3/uL (ref 150–400)
RBC: 4.53 MIL/uL (ref 3.87–5.11)
RDW: 14.3 % (ref 11.5–15.5)
WBC: 10.9 10*3/uL — ABNORMAL HIGH (ref 4.0–10.5)
nRBC: 0 % (ref 0.0–0.2)

## 2018-11-26 LAB — COMPREHENSIVE METABOLIC PANEL
ALBUMIN: 3.3 g/dL — AB (ref 3.5–5.0)
ALK PHOS: 81 U/L (ref 38–126)
ALT: 25 U/L (ref 0–44)
AST: 17 U/L (ref 15–41)
Anion gap: 10 (ref 5–15)
BUN: 40 mg/dL — AB (ref 8–23)
CO2: 28 mmol/L (ref 22–32)
Calcium: 8.9 mg/dL (ref 8.9–10.3)
Chloride: 100 mmol/L (ref 98–111)
Creatinine, Ser: 1.2 mg/dL — ABNORMAL HIGH (ref 0.44–1.00)
GFR calc Af Amer: 52 mL/min — ABNORMAL LOW (ref >60)
GFR calc non Af Amer: 45 mL/min — ABNORMAL LOW (ref >60)
GLUCOSE: 109 mg/dL — AB (ref 70–99)
Potassium: 4.4 mmol/L (ref 3.5–5.1)
SODIUM: 138 mmol/L (ref 135–145)
TOTAL PROTEIN: 7.3 g/dL (ref 6.5–8.1)
Total Bilirubin: 0.4 mg/dL (ref 0.3–1.2)

## 2018-11-26 LAB — URINALYSIS, COMPLETE (UACMP) WITH MICROSCOPIC
BILIRUBIN URINE: NEGATIVE
Glucose, UA: NEGATIVE mg/dL
KETONES UR: NEGATIVE mg/dL
Nitrite: POSITIVE — AB
PH: 5 (ref 5.0–8.0)
PROTEIN: NEGATIVE mg/dL
Specific Gravity, Urine: 1.019 (ref 1.005–1.030)

## 2018-11-26 NOTE — Progress Notes (Signed)
Radiation Oncology Follow up Note  Name: Tammy Choi   Date:   11/26/2018 MRN:  [id number] DOB: July 30, 1946    This 73 y.o. female presents to the clinic today for one-month follow-up status post whole breast radiation.to her right breast for stage Ia invasive mammary carcinoma ER positive PR HER-2/neu negative status post wide local excision  REFERRING PROVIDER: Leone Haven, MD  HPI: patient is a 73 year old female now out 1 month having completed whole breast radiation therapy to her right breast for stage I invasive mammary carcinoma status post wide local excision and sentinel node biopsy. Tumor was ER positive PR HER-2/neu negative. She is seen today in routine follow-up is doing well. She specifically denies breast tenderness cough or bone pain. She's been started on.arimadex and is tolerating that well without side effect.  COMPLICATIONS OF TREATMENT: none  FOLLOW UP COMPLIANCE: keeps appointments   PHYSICAL EXAM:  BP 131/70 (BP Location: Left Arm, Patient Position: Sitting)   Pulse 99   Temp (!) 97.1 F (36.2 C) (Tympanic)   Resp (!) 22   Wt 180 lb 10.7 oz (81.9 kg)   BMI 33.04 kg/m  Lungs are clear to A&P cardiac examination essentially unremarkable with regular rate and rhythm. No dominant mass or nodularity is noted in either breast in 2 positions examined. Incision is well-healed. No axillary or supraclavicular adenopathy is appreciated. Cosmetic result is excellent.still some slight hyperpigmentation of the right breast although resolving.Well-developed well-nourished patient in NAD. HEENT reveals PERLA, EOMI, discs not visualized.  Oral cavity is clear. No oral mucosal lesions are identified. Neck is clear without evidence of cervical or supraclavicular adenopathy. Lungs are clear to A&P. Cardiac examination is essentially unremarkable with regular rate and rhythm without murmur rub or thrill. Abdomen is benign with no organomegaly or masses noted. Motor sensory  and DTR levels are equal and symmetric in the upper and lower extremities. Cranial nerves II through XII are grossly intact. Proprioception is intact. No peripheral adenopathy or edema is identified. No motor or sensory levels are noted. Crude visual fields are within normal range.  RADIOLOGY RESULTS: no current films for review  PLAN: present time she is doing well 1 month out from whole breast radiation am please were overall progress. She continues on arimadex without side effect. I have asked to see her back in 4-5 months for follow-up. Patient knows to call with any concerns at any time.  I would like to take this opportunity to thank you for allowing me to participate in the care of your patient.Noreene Filbert, MD

## 2018-11-26 NOTE — Progress Notes (Signed)
Patient here for follow up. No concerns voiced.  °

## 2018-11-27 DIAGNOSIS — C50311 Malignant neoplasm of lower-inner quadrant of right female breast: Secondary | ICD-10-CM | POA: Diagnosis not present

## 2018-11-27 MED ORDER — CIPROFLOXACIN HCL 250 MG PO TABS: 250.0000 mg | ORAL_TABLET | Freq: Two times a day (BID) | ORAL | 0 refills | Status: DC

## 2018-11-27 NOTE — Progress Notes (Signed)
Hematology/Oncology follow up note [facility] Telephone:([phone]) [phone] Fax:(336) [phone]   Patient Care Team: Leone Haven, MD as PCP - General (Family Medicine) Bary Castilla, [name], MD (General Surgery)  REFERRING PROVIDER: Dr. Bary Castilla REASON FOR VISIT:  Evaluation of breast cancer  HISTORY OF PRESENTING ILLNESS:  Tammy Choi is a  73 y.o.  female with PMH listed below who was referred to me for evaluation of newly diagnosed breast cancer. Patient had mammogram 05/01/2018 which showed indeterminate microcalcification in the medial right breast measuring 0.9 x 0.7 x 0.7 cm Biopsy pathology showed: Invasive mammary carcinoma, DCIS, calcification associated with DCIS, grade 2, ER positive PR negative, HER-2 negative (IHC 1+)  Nipple discharge: Denies Family history: Sister passed away from breast cancer History of radiation to chest: denies.  Previous breast surgery: Left breast biopsy with benign etiology. . 06/16/2018, right breast lumpectomy and sentinel lymph node biopsy showed residual invasive mammary carcinoma, residual ductal carcinoma in situ, biopsy site changes and metallic clip.  Incidental 5 mm fibroadenoma.  All 3 sentinel lymph node negative. ER positive HER2 negative.  Patient underwent adjuvant radiation finished in January 2020. Started aromatase inhibitor with Arimidex 1 mg daily since 1/20/ 2020.  INTERVAL HISTORY Tammy Choi is a 73 y.o. female who has above history reviewed by me today presents for follow up visit for management of Stage IA Breast cancer, Estrogen receptor positive, HER 2 negative.  Patient has been started on Arimidex for about 3-4 weeks. Reports doing well at baseline.  Tolerates Arimidex well.  No new complaints. Denies significant increase of body pain, joint pain. She feel she may drink enough fluid daily. Endorses intermittent dysuria.  Review of Systems  Constitutional: Negative for chills, fever,  malaise/fatigue and weight loss.  HENT: Negative for nosebleeds and sore throat.   Eyes: Negative for double vision, photophobia and redness.  Respiratory: Negative for cough, shortness of breath and wheezing.   Cardiovascular: Negative for chest pain, palpitations, orthopnea and leg swelling.  Gastrointestinal: Negative for abdominal pain, blood in stool, nausea and vomiting.  Genitourinary: Negative for dysuria.  Musculoskeletal: Negative for back pain, myalgias and neck pain.  Skin: Negative for itching and rash.  Neurological: Negative for dizziness, tingling and tremors.  Endo/Heme/Allergies: Negative for environmental allergies. Does not bruise/bleed easily.  Psychiatric/Behavioral: Negative for depression and hallucinations. The patient is not nervous/anxious.     MEDICAL HISTORY:  Past Medical History:  Diagnosis Date  . Breast cancer ([place]) 05/07/2018   T1a, N0; ER/ PR positive, Her 2 neu not overexpressed.   Marland Kitchen COPD (chronic obstructive pulmonary disease) (HCC)    Not on home o2  . Depression   . HTN (hypertension)   . Hyperlipemia     SURGICAL HISTORY: Past Surgical History:  Procedure Laterality Date  . ABDOMINAL HYSTERECTOMY    . BREAST BIOPSY Left 04/30/2013   neg core  . BREAST BIOPSY Right 05/07/2018   right breast stereo x clip INVASIVE MAMMARY CARCINOMA  . BREAST LUMPECTOMY WITH SENTINEL LYMPH NODE BIOPSY Right 06/16/2018   Procedure: BREAST LUMPECTOMY WITH SENTINEL LYMPH NODE BX;  Surgeon: Robert Bellow, MD;  Location: [facility] ORS;  Service: General;  Laterality: Right;  . COLONOSCOPY  2014   Dr Bary Castilla  . Left Leg Surgery    . TONSILLECTOMY      SOCIAL HISTORY: Social History   Socioeconomic History  . Marital status: Single    Spouse name: Not on file  . Number of children: Not  on file  . Years of education: Not on file  . Highest education level: Not on file  Occupational History  . 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 Financial resource strain: Not on file  . Food insecurity:    Worry: Not on file    Inability: Not on file  . Transportation needs:    Medical: Not on file    Non-medical: Not on file  Tobacco Use  . Smoking status: Former Smoker    Packs/day: 1.00    Years: 35.00    Pack years: 35.00    Types: Cigarettes    Last attempt to quit: 12/20/2015    Years since quitting: 2.9  . Smokeless tobacco: Never Used  Substance and Sexual Activity  . Alcohol use: No    Alcohol/week: 0.0 standard drinks  . Drug use: No  . Sexual activity: Not on file  Lifestyle  . Physical activity:    Days per week: Not on file    Minutes per session: Not on file  . Stress: Not on file  Relationships  . Social connections:    Talks on phone: Not on file    Gets together: Not on file    Attends religious service: Not on file    Active member of club or organization: Not on file    Attends meetings of clubs or organizations: Not on file    Relationship status: Not on file  . Intimate partner violence:    Fear of current or ex partner: Not on file    Emotionally abused: Not on file    Physically abused: Not on file    Forced sexual activity: Not on file  Other Topics Concern  . Not on file  Social History Narrative   Lives with daughter at home. Independent at baseline.    FAMILY HISTORY: Family History  Problem Relation Age of Onset  . Kidney disease Mother        deceased 20  . Heart disease Father        deceased 61  . Breast cancer Other 68       maternal half-sister; deceased 39  . Colon cancer Neg Hx     ALLERGIES:  is allergic to penicillins.  MEDICATIONS:  Current Outpatient Medications  Medication Sig Dispense Refill  . acetaminophen (TYLENOL) 500 MG tablet Take 500-1,000 mg by mouth 2 (two) times daily as needed for moderate pain or headache.    . albuterol (PROAIR HFA) 108 (90 Base) MCG/ACT inhaler Inhale 2 puffs into the lungs every 6 (six) hours as needed for  wheezing or shortness of breath. 1 Inhaler 2  . anastrozole (ARIMIDEX) 1 MG tablet Take 1 tablet (1 mg total) by mouth daily. 30 tablet 3  . budesonide-formoterol (SYMBICORT) 160-4.5 MCG/ACT inhaler Inhale 2 puffs into the lungs 2 (two) times daily. 1 Inhaler 5  . cholecalciferol (VITAMIN D) 1000 UNITS tablet Take 2,000 Units by mouth every other day.     Marland Kitchen FLUoxetine (PROZAC) 40 MG capsule Take 1 capsule (40 mg total) by mouth at bedtime. 90 capsule 3  . hydrochlorothiazide (HYDRODIURIL) 12.5 MG tablet Take 1 tablet (12.5 mg total) by mouth daily. 90 tablet 1  . lisinopril (PRINIVIL,ZESTRIL) 40 MG tablet TAKE 1 TABLET BY MOUTH ONCE DAILY (Patient taking differently: Take 40 mg by mouth at bedtime. ) 90 tablet 3  . Multiple Vitamins-Minerals (MULTIVITAMIN WITH MINERALS) tablet Take 1 tablet by mouth every  other day.     . rosuvastatin (CRESTOR) 5 MG tablet TAKE 1 TABLET BY MOUTH ONCE DAILY 90 tablet 3  . trimethoprim-polymyxin b (POLYTRIM) ophthalmic solution Place 1 drop into the left eye every 4 (four) hours. 10 mL 0   No current facility-administered medications for this visit.      PHYSICAL EXAMINATION: ECOG PERFORMANCE STATUS: 0 - Asymptomatic Vitals:   11/26/18 1419  BP: 108/68  Pulse: 96  Resp: 18  Temp: 97.7 F (36.5 C)   Filed Weights   11/26/18 1419  Weight: 180 lb 12.8 oz (82 kg)    Physical Exam Constitutional:      General: She is not in acute distress. HENT:     Head: Normocephalic and atraumatic.  Eyes:     General: No scleral icterus.    Pupils: Pupils are equal, round, and reactive to light.  Neck:     Musculoskeletal: Normal range of motion and neck supple.  Cardiovascular:     Rate and Rhythm: Normal rate and regular rhythm.     Heart sounds: Normal heart sounds.  Pulmonary:     Effort: Pulmonary effort is normal. No respiratory distress.     Breath sounds: No wheezing.  Abdominal:     General: Bowel sounds are normal. There is no distension.      Palpations: Abdomen is soft. There is no mass.     Tenderness: There is no abdominal tenderness.  Musculoskeletal: Normal range of motion.        General: No deformity.  Skin:    General: Skin is warm and dry.     Findings: No erythema or rash.  Neurological:     Mental Status: She is alert and oriented to person, place, and time.     Cranial Nerves: No cranial nerve deficit.     Coordination: Coordination normal.  Psychiatric:        Behavior: Behavior normal.        Thought Content: Thought content normal.     LABORATORY DATA:  I have reviewed the data as listed Lab Results  Component Value Date   WBC 10.9 (H) 11/26/2018   HGB 12.6 11/26/2018   HCT 40.4 11/26/2018   MCV 89.2 11/26/2018   PLT 225 11/26/2018   Recent Labs    03/14/18 1459 08/01/18 1458 11/26/18 1350  NA 140 138 138  K 4.9 4.8 4.4  CL 102 103 100  CO2 _0 GLUCOSE 91 86 109*  BUN 22 35* 40*  CREATININE 0.89 0.91 1.20*  CALCIUM 10.0 9.1 8.9  GFRNONAA  --   --  45*  GFRAA  --   --  52*  PROT 7.0  --  7.3  ALBUMIN  --   --  3.3*  AST 16  --  17  ALT 13  --  25  ALKPHOS  --   --  81  BILITOT 0.4  --  0.4   Iron/TIBC/Ferritin/ %Sat No results found for: IRON, TIBC, FERRITIN, IRONPCTSAT   RADIOGRAPHIC STUDIES: I have personally reviewed the radiological images as listed and agreed with the findings in the report.   ASSESSMENT & PLAN:  1. AKI (acute kidney injury) ([place])   2. Malignant neoplasm of lower-inner quadrant of right breast of female, estrogen receptor positive ([place])   3. Family history of breast cancer   4. Aromatase inhibitor use   5. Acute cystitis without hematuria   Cancer Staging Malignant neoplasm of lower-inner quadrant of right breast  of female, estrogen receptor positive ([place]) Staging form: Breast, AJCC 8th Edition - Clinical: No stage assigned - Unsigned - Pathologic stage from 09/29/2018: Stage IA (pT1a, pN0, cM0, G2, ER+, PR-, HER2-) - Signed by Earlie Server, MD on  09/29/2018  Stage IA breast cancer ER positive, PR negative, HER-2 negative. Tolerates Arimidex 1 mg daily.  Continue. Recommend obtaining bone density.  Recommend to take calcium and vitamin supplements. Annual diagnostic breast mammogram due in August 2020. Previously discussed about genetic testing.  Patient is not interested.  AKI, creatinine 1.2.  Recommend patient to increase crease oral intake.  Repeat BMP in 1 week. Check UA. Urine is positive for nitrate, trace positive for leukocytes.  Will add urine culture.  Start empiric antibiotics with Cipro.    All questions were answered. The patient knows to call the clinic with any problems questions or concerns. Return of visit: 3 months for labs, MD reassessment.  Earlie Server, MD, PhD Hematology Oncology [facility] Pager- [phone] 11/27/2018

## 2018-11-28 ENCOUNTER — Other Ambulatory Visit: Payer: Self-pay | Admitting: *Deleted

## 2018-11-28 DIAGNOSIS — N3000 Acute cystitis without hematuria: Secondary | ICD-10-CM

## 2018-11-30 ENCOUNTER — Other Ambulatory Visit: Payer: Self-pay | Admitting: Oncology

## 2018-11-30 LAB — URINE CULTURE: Culture: >=100000 — AB

## 2018-11-30 MED ORDER — NITROFURANTOIN MONOHYD MACRO 100 MG PO CAPS: 100.0000 mg | ORAL_CAPSULE | Freq: Two times a day (BID) | ORAL | 0 refills | Status: DC

## 2018-12-01 ENCOUNTER — Other Ambulatory Visit: Payer: Self-pay

## 2018-12-01 ENCOUNTER — Inpatient Hospital Stay: Payer: PPO

## 2018-12-01 DIAGNOSIS — N3000 Acute cystitis without hematuria: Secondary | ICD-10-CM

## 2018-12-02 ENCOUNTER — Other Ambulatory Visit: Payer: Self-pay

## 2018-12-02 ENCOUNTER — Ambulatory Visit (INDEPENDENT_AMBULATORY_CARE_PROVIDER_SITE_OTHER): Payer: PPO | Admitting: General Surgery

## 2018-12-02 ENCOUNTER — Inpatient Hospital Stay: Payer: PPO | Attending: Oncology

## 2018-12-02 ENCOUNTER — Encounter: Payer: Self-pay | Admitting: General Surgery

## 2018-12-02 VITALS — BP 131/77 | HR 105 | Temp 97.7°F | Ht 67.0 in | Wt 180.0 lb

## 2018-12-02 DIAGNOSIS — C50311 Malignant neoplasm of lower-inner quadrant of right female breast: Secondary | ICD-10-CM | POA: Insufficient documentation

## 2018-12-02 DIAGNOSIS — N3000 Acute cystitis without hematuria: Secondary | ICD-10-CM

## 2018-12-02 DIAGNOSIS — Z170 Estrogen receptor positive status [ER+]: Secondary | ICD-10-CM

## 2018-12-02 LAB — BASIC METABOLIC PANEL
ANION GAP: 8 (ref 5–15)
BUN: 32 mg/dL — ABNORMAL HIGH (ref 8–23)
CO2: 27 mmol/L (ref 22–32)
Calcium: 9 mg/dL (ref 8.9–10.3)
Chloride: 102 mmol/L (ref 98–111)
Creatinine, Ser: 1.13 mg/dL — ABNORMAL HIGH (ref 0.44–1.00)
GFR calc Af Amer: 56 mL/min — ABNORMAL LOW (ref >60)
GFR calc non Af Amer: 48 mL/min — ABNORMAL LOW (ref >60)
Glucose, Bld: 113 mg/dL — ABNORMAL HIGH (ref 70–99)
Potassium: 4.6 mmol/L (ref 3.5–5.1)
SODIUM: 137 mmol/L (ref 135–145)

## 2018-12-02 NOTE — Progress Notes (Signed)
Patient ID: Tammy Choi, female   DOB: 1946/01/25, 73 y.o.   MRN: [id number]  Chief Complaint  Patient presents with  . Follow-up    HPI Tammy Choi is a 73 y.o. female here today for her three month follow up right lumpectomy. Patient states she is doing well.  Radiation was completed in January.    HPI  Past Medical History:  Diagnosis Date  . Breast cancer ([place]) 05/07/2018   T1a, N0; ER/ PR positive, Her 2 neu not overexpressed.   Marland Kitchen COPD (chronic obstructive pulmonary disease) (HCC)    Not on home o2  . Depression   . HTN (hypertension)   . Hyperlipemia     Past Surgical History:  Procedure Laterality Date  . ABDOMINAL HYSTERECTOMY    . BREAST BIOPSY Left 04/30/2013   neg core  . BREAST BIOPSY Right 05/07/2018   right breast stereo x clip INVASIVE MAMMARY CARCINOMA  . BREAST LUMPECTOMY WITH SENTINEL LYMPH NODE BIOPSY Right 06/16/2018   Procedure: BREAST LUMPECTOMY WITH SENTINEL LYMPH NODE BX;  Surgeon: Robert Bellow, MD;  Location: [facility] ORS;  Service: General;  Laterality: Right;  . COLONOSCOPY  2014   Dr Bary Castilla  . Left Leg Surgery    . TONSILLECTOMY      Family History  Problem Relation Age of Onset  . Kidney disease Mother        deceased 45  . Heart disease Father        deceased 5  . Breast cancer Other 70       maternal half-sister; deceased 14  . Colon cancer Neg Hx     Social History Social History   Tobacco Use  . Smoking status: Former Smoker    Packs/day: 1.00    Years: 35.00    Pack years: 35.00    Types: Cigarettes    Last attempt to quit: 12/20/2015    Years since quitting: 2.9  . Smokeless tobacco: Never Used  Substance Use Topics  . Alcohol use: No    Alcohol/week: 0.0 standard drinks  . Drug use: No    Allergies  Allergen Reactions  . Penicillins Anaphylaxis and Other (See Comments)    Has patient had a PCN reaction causing immediate rash, facial/tongue/throat swelling, SOB or lightheadedness with hypotension:  Yes Has patient had a PCN reaction causing severe rash involving mucus membranes or skin necrosis: No Has patient had a PCN reaction that required hospitalization No Has patient had a PCN reaction occurring within the last 10 years: No If all of the above answers are "NO", then may proceed with Cephalosporin use.    Current Outpatient Medications  Medication Sig Dispense Refill  . acetaminophen (TYLENOL) 500 MG tablet Take 500-1,000 mg by mouth 2 (two) times daily as needed for moderate pain or headache.    . albuterol (PROAIR HFA) 108 (90 Base) MCG/ACT inhaler Inhale 2 puffs into the lungs every 6 (six) hours as needed for wheezing or shortness of breath. 1 Inhaler 2  . anastrozole (ARIMIDEX) 1 MG tablet Take 1 tablet (1 mg total) by mouth daily. 30 tablet 3  . budesonide-formoterol (SYMBICORT) 160-4.5 MCG/ACT inhaler Inhale 2 puffs into the lungs 2 (two) times daily. 1 Inhaler 5  . cholecalciferol (VITAMIN D) 1000 UNITS tablet Take 2,000 Units by mouth every other day.     Marland Kitchen FLUoxetine (PROZAC) 40 MG capsule Take 1 capsule (40 mg total) by mouth at bedtime. 90 capsule 3  . hydrochlorothiazide (HYDRODIURIL) 12.5 MG  tablet Take 1 tablet (12.5 mg total) by mouth daily. 90 tablet 1  . lisinopril (PRINIVIL,ZESTRIL) 40 MG tablet TAKE 1 TABLET BY MOUTH ONCE DAILY (Patient taking differently: Take 40 mg by mouth at bedtime. ) 90 tablet 3  . Multiple Vitamins-Minerals (MULTIVITAMIN WITH MINERALS) tablet Take 1 tablet by mouth every other day.     . nitrofurantoin, macrocrystal-monohydrate, (MACROBID) 100 MG capsule Take 1 capsule (100 mg total) by mouth 2 (two) times daily. 10 capsule 0  . rosuvastatin (CRESTOR) 5 MG tablet TAKE 1 TABLET BY MOUTH ONCE DAILY 90 tablet 3  . trimethoprim-polymyxin b (POLYTRIM) ophthalmic solution Place 1 drop into the left eye every 4 (four) hours. 10 mL 0   No current facility-administered medications for this visit.     Review of Systems Review of Systems   Constitutional: Negative.   Respiratory: Negative.   Cardiovascular: Negative.     Blood pressure 131/77, pulse (!) 105, temperature 97.7 F (36.5 C), temperature source Skin, height 5\' 7"  (1.702 m), weight 180 lb (81.6 kg), SpO2 99 %.  Physical Exam Physical Exam Exam conducted with a chaperone present.  Constitutional:      Appearance: Normal appearance. She is well-developed.  HENT:     Mouth/Throat:     Pharynx: No oropharyngeal exudate.  Eyes:     General: No scleral icterus.    Conjunctiva/sclera: Conjunctivae normal.  Neck:     Musculoskeletal: Neck supple.  Cardiovascular:     Rate and Rhythm: Normal rate and regular rhythm.     Heart sounds: Normal heart sounds.  Pulmonary:     Effort: Pulmonary effort is normal.     Breath sounds: Normal breath sounds.  Chest:     Breasts:        Right: No inverted nipple, mass, nipple discharge, skin change or tenderness.        Left: No inverted nipple, mass, nipple discharge, skin change or tenderness.       Comments: Photo taken Lymphadenopathy:     Cervical: No cervical adenopathy.     Upper Body:     Right upper body: No supraclavicular or axillary adenopathy.     Left upper body: No supraclavicular or axillary adenopathy.  Skin:    General: Skin is warm and dry.  Neurological:     Mental Status: She is alert and oriented to person, place, and time.  Psychiatric:        Mood and Affect: Mood normal.        Behavior: Behavior normal.       Data Reviewed Medical oncology and radiation oncology notes of November 26, 2018 reviewed.  Assessment Doing well post wide excision and whole breast radiation.  Near complete resolution of previously noted dermal changes.  Plan   The patient has been asked to return to the office in 5 months with a bilateral diagnostic mammogram.  The patient is aware to call back for any questions or new concerns.    HPI, assessment, plan and physical exam has been scribed under  the direction and in the presence of Robert Bellow, MD. Karie Fetch, RN  I have completed the exam and reviewed the above documentation for accuracy and completeness.  I agree with the above.  Haematologist has been used and any errors in dictation or transcription are unintentional.  Hervey Ard, M.D., F.A.C.S.   [name] Byrnett 12/02/2018, 7:33 PM

## 2018-12-02 NOTE — Patient Instructions (Signed)
The patient is aware to call back for any questions or new concerns.  

## 2019-01-22 ENCOUNTER — Other Ambulatory Visit: Payer: Self-pay | Admitting: Family Medicine

## 2019-01-23 ENCOUNTER — Other Ambulatory Visit: Payer: Self-pay | Admitting: *Deleted

## 2019-01-23 MED ORDER — ANASTROZOLE 1 MG PO TABS: 1.0000 mg | ORAL_TABLET | Freq: Every day | ORAL | 0 refills | Status: DC

## 2019-01-23 NOTE — Telephone Encounter (Signed)
Patient has no follow up appointment scheduled with Dr Tasia Catchings

## 2019-02-10 ENCOUNTER — Telehealth: Payer: Self-pay | Admitting: Family Medicine

## 2019-02-10 ENCOUNTER — Ambulatory Visit (INDEPENDENT_AMBULATORY_CARE_PROVIDER_SITE_OTHER): Payer: PPO | Admitting: Family Medicine

## 2019-02-10 ENCOUNTER — Other Ambulatory Visit: Payer: Self-pay

## 2019-02-10 ENCOUNTER — Encounter: Payer: Self-pay | Admitting: Family Medicine

## 2019-02-10 DIAGNOSIS — I10 Essential (primary) hypertension: Secondary | ICD-10-CM | POA: Diagnosis not present

## 2019-02-10 DIAGNOSIS — J309 Allergic rhinitis, unspecified: Secondary | ICD-10-CM | POA: Insufficient documentation

## 2019-02-10 DIAGNOSIS — J449 Chronic obstructive pulmonary disease, unspecified: Secondary | ICD-10-CM | POA: Diagnosis not present

## 2019-02-10 DIAGNOSIS — E785 Hyperlipidemia, unspecified: Secondary | ICD-10-CM

## 2019-02-10 DIAGNOSIS — R7303 Prediabetes: Secondary | ICD-10-CM

## 2019-02-10 MED ORDER — FLUTICASONE PROPIONATE 50 MCG/ACT NA SUSP: 2.0000 | Freq: Every day | NASAL | 6 refills | Status: DC

## 2019-02-10 NOTE — Assessment & Plan Note (Signed)
Continue Crestor.  Plan for lab work at follow-up in 3 months.

## 2019-02-10 NOTE — Assessment & Plan Note (Signed)
Plan for lab work in 3 months at follow-up.

## 2019-02-10 NOTE — Assessment & Plan Note (Signed)
Treating with Flonase. 

## 2019-02-10 NOTE — Assessment & Plan Note (Signed)
Relatively stable. Some wheezing potentially related to allergies. Continue current medications.  Will treat allergies.

## 2019-02-10 NOTE — Assessment & Plan Note (Signed)
She will check at home for 2-3 weeks and contact us with her readings. Continue current medications.

## 2019-02-10 NOTE — Progress Notes (Signed)
Virtual Visit via video note  This visit type was conducted due to national recommendations for restrictions regarding the COVID-19 pandemic (e.g. social distancing).  This format is felt to be most appropriate for this patient at this time.  All issues noted in this document were discussed and addressed.  No physical exam was performed (except for noted visual exam findings with Video Visits).   I connected with Tammy Choi today at  3:30 PM EDT by a video enabled telemedicine application and verified that I am speaking with the correct person using two identifiers. Location patient: home Location provider: work  Persons participating in the virtual visit: patient, provider, Kalman Jewels (daughter), Valetta Fuller (daughter's mother in law)  I discussed the limitations, risks, security and privacy concerns of performing an evaluation and management service by telephone and the availability of in person appointments. I also discussed with the patient that there may be a patient responsible charge related to this service. The patient expressed understanding and agreed to proceed.  Reason for visit: Follow-up  HPI: Hypertension: She is not checking at home though it was 131/77 at her last visit with the doctor.  Taking HCTZ and lisinopril.  No chest pain or shortness of breath.  She does note some chronic ankle edema right greater than left that typically occurs when she is been more active.  It resolves overnight.  No orthopnea or PND  Hyperlipidemia: Taking Crestor.  No right upper quadrant pain.  She does note her legs hurt at times after she has been out walking.  COPD: taking symbicort. Rarely needs albuterol. Minimal wheezing over the past couple of days. Does have allergy symptoms with watery eyes, rhinorrhea, and post nasal drip. No allergy medications.   Prediabetes: no polyuria or polydipsia.   ROS: See pertinent positives and negatives per HPI.  Past Medical History:   Diagnosis Date  . Breast cancer ([place]) 05/07/2018   T1a, N0; ER/ PR positive, Her 2 neu not overexpressed.   Marland Kitchen COPD (chronic obstructive pulmonary disease) (HCC)    Not on home o2  . Depression   . HTN (hypertension)   . Hyperlipemia     Past Surgical History:  Procedure Laterality Date  . ABDOMINAL HYSTERECTOMY    . BREAST BIOPSY Left 04/30/2013   neg core  . BREAST BIOPSY Right 05/07/2018   right breast stereo x clip INVASIVE MAMMARY CARCINOMA  . BREAST LUMPECTOMY WITH SENTINEL LYMPH NODE BIOPSY Right 06/16/2018   Procedure: BREAST LUMPECTOMY WITH SENTINEL LYMPH NODE BX;  Surgeon: Robert Bellow, MD;  Location: [facility] ORS;  Service: General;  Laterality: Right;  . COLONOSCOPY  2014   Dr Bary Castilla  . Left Leg Surgery    . TONSILLECTOMY      Family History  Problem Relation Age of Onset  . Kidney disease Mother        deceased 87  . Heart disease Father        deceased 92  . Breast cancer Other 30       maternal half-sister; deceased 9  . Colon cancer Neg Hx     SOCIAL HX: former smoker   Current Outpatient Medications:  .  acetaminophen (TYLENOL) 500 MG tablet, Take 500-1,000 mg by mouth 2 (two) times daily as needed for moderate pain or headache., Disp: , Rfl:  .  albuterol (PROAIR HFA) 108 (90 Base) MCG/ACT inhaler, Inhale 2 puffs into the lungs every 6 (six) hours as needed for wheezing or shortness of breath., Disp:  1 Inhaler, Rfl: 2 .  anastrozole (ARIMIDEX) 1 MG tablet, Take 1 tablet (1 mg total) by mouth daily., Disp: 90 tablet, Rfl: 0 .  budesonide-formoterol (SYMBICORT) 160-4.5 MCG/ACT inhaler, Inhale 2 puffs into the lungs 2 (two) times daily., Disp: 1 Inhaler, Rfl: 5 .  cholecalciferol (VITAMIN D) 1000 UNITS tablet, Take 2,000 Units by mouth every other day. , Disp: , Rfl:  .  FLUoxetine (PROZAC) 40 MG capsule, Take 1 capsule (40 mg total) by mouth at bedtime., Disp: 90 capsule, Rfl: 3 .  hydrochlorothiazide (HYDRODIURIL) 12.5 MG tablet, Take 1 tablet by mouth  once daily, Disp: 90 tablet, Rfl: 0 .  Multiple Vitamins-Minerals (MULTIVITAMIN WITH MINERALS) tablet, Take 1 tablet by mouth every other day. , Disp: , Rfl:  .  rosuvastatin (CRESTOR) 5 MG tablet, TAKE 1 TABLET BY MOUTH ONCE DAILY, Disp: 90 tablet, Rfl: 3 .  lisinopril (PRINIVIL,ZESTRIL) 40 MG tablet, TAKE 1 TABLET BY MOUTH ONCE DAILY (Patient taking differently: Take 40 mg by mouth at bedtime. ), Disp: 90 tablet, Rfl: 3 .  nitrofurantoin, macrocrystal-monohydrate, (MACROBID) 100 MG capsule, Take 1 capsule (100 mg total) by mouth 2 (two) times daily. (Patient not taking: Reported on 02/10/2019), Disp: 10 capsule, Rfl: 0 .  trimethoprim-polymyxin b (POLYTRIM) ophthalmic solution, Place 1 drop into the left eye every 4 (four) hours. (Patient not taking: Reported on 02/10/2019), Disp: 10 mL, Rfl: 0  EXAM:  VITALS per patient if applicable: none.  GENERAL: alert, oriented, appears well and in no acute distress  HEENT: atraumatic, conjunttiva clear, no obvious abnormalities on inspection of external nose and ears  NECK: normal movements of the head and neck  LUNGS: on inspection no signs of respiratory distress, breathing rate appears normal, no obvious gross SOB, gasping or wheezing  CV: no obvious cyanosis  MS: moves all visible extremities without noticeable abnormality  PSYCH/NEURO: pleasant and cooperative, no obvious depression or anxiety, speech and thought processing grossly intact  ASSESSMENT AND PLAN:  Discussed the following assessment and plan:  No diagnosis found.  No problem-specific Assessment & Plan notes found for this encounter.  CMA will contact patient for follow-up in 3 months.  Social distancing precautions given regarding COVID-19.   I discussed the assessment and treatment plan with the patient. The patient was provided an opportunity to ask questions and all were answered. The patient agreed with the plan and demonstrated an understanding of the instructions.    The patient was advised to call back or seek an in-person evaluation if the symptoms worsen or if the condition fails to improve as anticipated.   [name], MD

## 2019-02-10 NOTE — Telephone Encounter (Signed)
Please contact the patient and get her set up for follow-up in 3 months.  Thanks.

## 2019-02-12 NOTE — Telephone Encounter (Signed)
Spoken to patient.  She was currently cooking and did not have time to schedule appointment. She stated she will return call back to schedule after cooking.

## 2019-02-20 ENCOUNTER — Other Ambulatory Visit: Payer: Self-pay | Admitting: *Deleted

## 2019-02-20 ENCOUNTER — Other Ambulatory Visit: Payer: Self-pay

## 2019-02-20 ENCOUNTER — Inpatient Hospital Stay: Payer: PPO | Attending: Oncology

## 2019-02-20 DIAGNOSIS — N179 Acute kidney failure, unspecified: Secondary | ICD-10-CM | POA: Diagnosis not present

## 2019-02-20 DIAGNOSIS — Z79899 Other long term (current) drug therapy: Secondary | ICD-10-CM | POA: Insufficient documentation

## 2019-02-20 DIAGNOSIS — Z170 Estrogen receptor positive status [ER+]: Secondary | ICD-10-CM | POA: Insufficient documentation

## 2019-02-20 DIAGNOSIS — E785 Hyperlipidemia, unspecified: Secondary | ICD-10-CM | POA: Diagnosis not present

## 2019-02-20 DIAGNOSIS — I10 Essential (primary) hypertension: Secondary | ICD-10-CM | POA: Insufficient documentation

## 2019-02-20 DIAGNOSIS — J449 Chronic obstructive pulmonary disease, unspecified: Secondary | ICD-10-CM | POA: Insufficient documentation

## 2019-02-20 DIAGNOSIS — R7989 Other specified abnormal findings of blood chemistry: Secondary | ICD-10-CM | POA: Insufficient documentation

## 2019-02-20 DIAGNOSIS — Z87891 Personal history of nicotine dependence: Secondary | ICD-10-CM | POA: Diagnosis not present

## 2019-02-20 DIAGNOSIS — Z803 Family history of malignant neoplasm of breast: Secondary | ICD-10-CM | POA: Diagnosis not present

## 2019-02-20 DIAGNOSIS — Z7951 Long term (current) use of inhaled steroids: Secondary | ICD-10-CM | POA: Diagnosis not present

## 2019-02-20 DIAGNOSIS — C50311 Malignant neoplasm of lower-inner quadrant of right female breast: Secondary | ICD-10-CM | POA: Insufficient documentation

## 2019-02-20 DIAGNOSIS — Z79811 Long term (current) use of aromatase inhibitors: Secondary | ICD-10-CM | POA: Diagnosis not present

## 2019-02-20 LAB — CBC WITH DIFFERENTIAL/PLATELET
Abs Immature Granulocytes: 0.03 10*3/uL (ref 0.00–0.07)
Basophils Absolute: 0 10*3/uL (ref 0.0–0.1)
Basophils Relative: 1 %
Eosinophils Absolute: 0.2 10*3/uL (ref 0.0–0.5)
Eosinophils Relative: 2 %
HCT: 38.8 % (ref 36.0–46.0)
Hemoglobin: 12.3 g/dL (ref 12.0–15.0)
Immature Granulocytes: 0 %
Lymphocytes Relative: 39 %
Lymphs Abs: 3.4 10*3/uL (ref 0.7–4.0)
MCH: 28.1 pg (ref 26.0–34.0)
MCHC: 31.7 g/dL (ref 30.0–36.0)
MCV: 88.8 fL (ref 80.0–100.0)
Monocytes Absolute: 0.6 10*3/uL (ref 0.1–1.0)
Monocytes Relative: 7 %
Neutro Abs: 4.6 10*3/uL (ref 1.7–7.7)
Neutrophils Relative %: 51 %
Platelets: 227 10*3/uL (ref 150–400)
RBC: 4.37 MIL/uL (ref 3.87–5.11)
RDW: 14.9 % (ref 11.5–15.5)
WBC: 8.8 10*3/uL (ref 4.0–10.5)
nRBC: 0 % (ref 0.0–0.2)

## 2019-02-20 LAB — COMPREHENSIVE METABOLIC PANEL
ALT: 15 U/L (ref 0–44)
AST: 16 U/L (ref 15–41)
Albumin: 4.2 g/dL (ref 3.5–5.0)
Alkaline Phosphatase: 67 U/L (ref 38–126)
Anion gap: 8 (ref 5–15)
BUN: 47 mg/dL — ABNORMAL HIGH (ref 8–23)
CO2: 27 mmol/L (ref 22–32)
Calcium: 9 mg/dL (ref 8.9–10.3)
Chloride: 104 mmol/L (ref 98–111)
Creatinine, Ser: 1.46 mg/dL — ABNORMAL HIGH (ref 0.44–1.00)
GFR calc Af Amer: 41 mL/min — ABNORMAL LOW (ref >60)
GFR calc non Af Amer: 35 mL/min — ABNORMAL LOW (ref >60)
Glucose, Bld: 112 mg/dL — ABNORMAL HIGH (ref 70–99)
Potassium: 5.6 mmol/L — ABNORMAL HIGH (ref 3.5–5.1)
Sodium: 139 mmol/L (ref 135–145)
Total Bilirubin: 0.6 mg/dL (ref 0.3–1.2)
Total Protein: 7.1 g/dL (ref 6.5–8.1)

## 2019-02-24 ENCOUNTER — Other Ambulatory Visit: Payer: Self-pay | Admitting: *Deleted

## 2019-02-24 ENCOUNTER — Encounter: Payer: Self-pay | Admitting: Oncology

## 2019-02-24 ENCOUNTER — Other Ambulatory Visit: Payer: Self-pay

## 2019-02-24 ENCOUNTER — Inpatient Hospital Stay (HOSPITAL_BASED_OUTPATIENT_CLINIC_OR_DEPARTMENT_OTHER): Payer: PPO | Admitting: Oncology

## 2019-02-24 ENCOUNTER — Inpatient Hospital Stay: Payer: PPO

## 2019-02-24 DIAGNOSIS — J449 Chronic obstructive pulmonary disease, unspecified: Secondary | ICD-10-CM

## 2019-02-24 DIAGNOSIS — Z803 Family history of malignant neoplasm of breast: Secondary | ICD-10-CM

## 2019-02-24 DIAGNOSIS — I10 Essential (primary) hypertension: Secondary | ICD-10-CM

## 2019-02-24 DIAGNOSIS — Z79899 Other long term (current) drug therapy: Secondary | ICD-10-CM | POA: Diagnosis not present

## 2019-02-24 DIAGNOSIS — C50311 Malignant neoplasm of lower-inner quadrant of right female breast: Secondary | ICD-10-CM

## 2019-02-24 DIAGNOSIS — Z170 Estrogen receptor positive status [ER+]: Secondary | ICD-10-CM

## 2019-02-24 DIAGNOSIS — Z79811 Long term (current) use of aromatase inhibitors: Secondary | ICD-10-CM | POA: Diagnosis not present

## 2019-02-24 DIAGNOSIS — E785 Hyperlipidemia, unspecified: Secondary | ICD-10-CM

## 2019-02-24 DIAGNOSIS — Z7951 Long term (current) use of inhaled steroids: Secondary | ICD-10-CM | POA: Diagnosis not present

## 2019-02-24 DIAGNOSIS — N179 Acute kidney failure, unspecified: Secondary | ICD-10-CM | POA: Diagnosis not present

## 2019-02-24 DIAGNOSIS — Z87891 Personal history of nicotine dependence: Secondary | ICD-10-CM | POA: Diagnosis not present

## 2019-02-24 DIAGNOSIS — R7989 Other specified abnormal findings of blood chemistry: Secondary | ICD-10-CM

## 2019-02-24 NOTE — Telephone Encounter (Signed)
Prescription not due

## 2019-02-24 NOTE — Progress Notes (Signed)
Called patient today for Telehealth via [employer].  Patient states no new concerns today.

## 2019-02-25 ENCOUNTER — Other Ambulatory Visit: Payer: Self-pay

## 2019-02-25 ENCOUNTER — Ambulatory Visit
Admission: RE | Admit: 2019-02-25 | Discharge: 2019-02-25 | Disposition: A | Discharge status: Home / Self Care | Payer: PPO | Source: Ambulatory Visit | Attending: Oncology | Admitting: Oncology

## 2019-02-25 ENCOUNTER — Other Ambulatory Visit: Payer: Self-pay | Admitting: Oncology

## 2019-02-25 ENCOUNTER — Inpatient Hospital Stay: Payer: PPO

## 2019-02-25 DIAGNOSIS — N179 Acute kidney failure, unspecified: Secondary | ICD-10-CM | POA: Insufficient documentation

## 2019-02-25 DIAGNOSIS — C50311 Malignant neoplasm of lower-inner quadrant of right female breast: Secondary | ICD-10-CM | POA: Diagnosis not present

## 2019-02-25 DIAGNOSIS — N200 Calculus of kidney: Secondary | ICD-10-CM | POA: Diagnosis not present

## 2019-02-25 LAB — BASIC METABOLIC PANEL
Anion gap: 10 (ref 5–15)
BUN: 43 mg/dL — ABNORMAL HIGH (ref 8–23)
CO2: 24 mmol/L (ref 22–32)
Calcium: 8.9 mg/dL (ref 8.9–10.3)
Chloride: 103 mmol/L (ref 98–111)
Creatinine, Ser: 0.94 mg/dL (ref 0.44–1.00)
GFR calc Af Amer: >60 mL/min (ref >60)
GFR calc non Af Amer: >60 mL/min (ref >60)
Glucose, Bld: 101 mg/dL — ABNORMAL HIGH (ref 70–99)
Potassium: 4.7 mmol/L (ref 3.5–5.1)
Sodium: 137 mmol/L (ref 135–145)

## 2019-02-26 ENCOUNTER — Encounter: Payer: Self-pay | Admitting: *Deleted

## 2019-02-26 LAB — KAPPA/LAMBDA LIGHT CHAINS
Kappa free light chain: 56.3 mg/L — ABNORMAL HIGH (ref 3.3–19.4)
Kappa, lambda light chain ratio: 2.45 — ABNORMAL HIGH (ref 0.26–1.65)
Lambda free light chains: 23 mg/L (ref 5.7–26.3)

## 2019-02-27 LAB — MULTIPLE MYELOMA PANEL, SERUM
Albumin SerPl Elph-Mcnc: 3.5 g/dL (ref 2.9–4.4)
Albumin/Glob SerPl: 1.3 (ref 0.7–1.7)
Alpha 1: 0.2 g/dL (ref 0.0–0.4)
Alpha2 Glob SerPl Elph-Mcnc: 0.9 g/dL (ref 0.4–1.0)
B-Globulin SerPl Elph-Mcnc: 0.9 g/dL (ref 0.7–1.3)
Gamma Glob SerPl Elph-Mcnc: 0.8 g/dL (ref 0.4–1.8)
Globulin, Total: 2.9 g/dL (ref 2.2–3.9)
IgA: 133 mg/dL (ref 64–422)
IgG (Immunoglobin G), Serum: 865 mg/dL (ref 586–1602)
IgM (Immunoglobulin M), Srm: 49 mg/dL (ref 26–217)
Total Protein ELP: 6.4 g/dL (ref 6.0–8.5)

## 2019-02-28 ENCOUNTER — Other Ambulatory Visit: Payer: Self-pay | Admitting: Family Medicine

## 2019-02-28 DIAGNOSIS — N1330 Unspecified hydronephrosis: Secondary | ICD-10-CM

## 2019-03-02 NOTE — Progress Notes (Signed)
HEMATOLOGY-ONCOLOGY TeleHEALTH VISIT PROGRESS NOTE  I connected with Tammy Choi on 03/02/19 at  2:00 PM EDT by video enabled telemedicine visit and verified that I am speaking with the correct person using two identifiers. I discussed the limitations, risks, security and privacy concerns of performing an evaluation and management service by telemedicine and the availability of in-person appointments. I also discussed with the patient that there may be a patient responsible charge related to this service. The patient expressed understanding and agreed to proceed.   Other persons participating in the visit and their role in the encounter:  Geraldine Solar, [place], check in patient   Janeann Merl, RN, check in patient.   Patient's location: Home  Provider's location: work Risk analyst Complaint: follow up for breast cancer treatment.    INTERVAL HISTORY Tammy Choi is a 73 y.o. female who has above history reviewed by me today presents for follow up visit for management of Stage IA Breast cancer, Estrogen receptor positive, HER 2 negative. Patient takes arimidex 53m daily since Jan 2020.  She reports tolerating well. manageable joint pain.  No new concerns. No concerns for her breast.   Review of Systems  Constitutional: Negative for appetite change, chills, fatigue and fever.  HENT:   Negative for hearing loss and voice change.   Eyes: Negative for eye problems.  Respiratory: Negative for chest tightness and cough.   Cardiovascular: Negative for chest pain.  Gastrointestinal: Negative for abdominal distention, abdominal pain and blood in stool.  Endocrine: Negative for hot flashes.  Genitourinary: Negative for difficulty urinating and frequency.   Musculoskeletal: Positive for arthralgias.  Skin: Negative for itching and rash.  Neurological: Negative for extremity weakness.  Hematological: Negative for adenopathy.  Psychiatric/Behavioral: Negative for confusion.    Past  Medical History:  Diagnosis Date  . [place] 05/[place]   T1a, N0; ER/ PR positive, Her 2 neu not overexpressed.   .Marland KitchenCOPD (chronic obstructive pulmonary disease) (HCC)    Not on home o2  . Depression   . HTN (hypertension)   . Hyperlipemia    Past Surgical History:  Procedure Laterality Date  . ABDOMINAL HYSTERECTOMY    . BREAST BIOPSY Left 04/30/2013   neg core  . BREAST BIOPSY Right 05/07/2018   right breast stereo x clip INVASIVE MAMMARY CARCINOMA  . BREAST LUMPECTOMY WITH SENTINEL LYMPH NODE BIOPSY Right 06/16/2018   Procedure: BREAST LUMPECTOMY WITH SENTINEL LYMPH NODE BX;  Surgeon: BRobert Bellow MD;  Location: [facility] ORS;  Service: General;  Laterality: Right;  . COLONOSCOPY  2014   Dr BBary Castilla . Left Leg Surgery    . TONSILLECTOMY      Family History  Problem Relation Age of Onset  . Kidney disease Mother        deceased 869 . Heart disease Father        deceased 871 . Breast cancer Other 426      maternal half-sister; deceased 68 . Colon cancer Neg Hx     Social History   Socioeconomic History  . Marital status: Divorced    Spouse name: Not on file  . Number of children: Not on file  . Years of education: Not on file  . Highest education level: Not on file  Occupational History  . 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 Financial resource strain: Not on file  . Food insecurity:    Worry: Not on file  Inability: Not on file  . Transportation needs:    Medical: Not on file    Non-medical: Not on file  Tobacco Use  . Smoking status: Former Smoker    Packs/day: 1.00    Years: 35.00    Pack years: 35.00    Types: Cigarettes    Last attempt to quit: 12/20/2015    Years since quitting: 3.2  . Smokeless tobacco: Never Used  Substance and Sexual Activity  . Alcohol use: No    Alcohol/week: 0.0 standard drinks  . Drug use: No  . Sexual activity: Not on file  Lifestyle  . Physical activity:    Days  per week: Not on file    Minutes per session: Not on file  . Stress: Not on file  Relationships  . Social connections:    Talks on phone: Not on file    Gets together: Not on file    Attends religious service: Not on file    Active member of club or organization: Not on file    Attends meetings of clubs or organizations: Not on file    Relationship status: Not on file  . Intimate partner violence:    Fear of current or ex partner: Not on file    Emotionally abused: Not on file    Physically abused: Not on file    Forced sexual activity: Not on file  Other Topics Concern  . Not on file  Social History Narrative   Lives with daughter at home. Independent at baseline.    Current Outpatient Medications on File Prior to Visit  Medication Sig Dispense Refill  . acetaminophen (TYLENOL) 500 MG tablet Take 500-1,000 mg by mouth 2 (two) times daily as needed for moderate pain or headache.    . albuterol (PROAIR HFA) 108 (90 Base) MCG/ACT inhaler Inhale 2 puffs into the lungs every 6 (six) hours as needed for wheezing or shortness of breath. 1 Inhaler 2  . budesonide-formoterol (SYMBICORT) 160-4.5 MCG/ACT inhaler Inhale 2 puffs into the lungs 2 (two) times daily. 1 Inhaler 5  . cholecalciferol (VITAMIN D) 1000 UNITS tablet Take 2,000 Units by mouth every other day.     Marland Kitchen FLUoxetine (PROZAC) 40 MG capsule Take 1 capsule (40 mg total) by mouth at bedtime. 90 capsule 3  . fluticasone (FLONASE) 50 MCG/ACT nasal spray Place 2 sprays into both nostrils daily. 16 g 6  . hydrochlorothiazide (HYDRODIURIL) 12.5 MG tablet Take 1 tablet by mouth once daily 90 tablet 0  . lisinopril (PRINIVIL,ZESTRIL) 40 MG tablet TAKE 1 TABLET BY MOUTH ONCE DAILY (Patient taking differently: Take 40 mg by mouth at bedtime. ) 90 tablet 3  . Multiple Vitamins-Minerals (MULTIVITAMIN WITH MINERALS) tablet Take 1 tablet by mouth every other day.     . rosuvastatin (CRESTOR) 5 MG tablet TAKE 1 TABLET BY MOUTH ONCE DAILY 90 tablet  3  . trimethoprim-polymyxin b (POLYTRIM) ophthalmic solution Place 1 drop into the left eye every 4 (four) hours. 10 mL 0   No current facility-administered medications on file prior to visit.     Allergies  Allergen Reactions  . Penicillins Anaphylaxis and Other (See Comments)    Has patient had a PCN reaction causing immediate rash, facial/tongue/throat swelling, SOB or lightheadedness with hypotension: Yes Has patient had a PCN reaction causing severe rash involving mucus membranes or skin necrosis: No Has patient had a PCN reaction that required hospitalization No Has patient had a PCN reaction occurring within the last 10 years: No  If all of the above answers are "NO", then may proceed with Cephalosporin use.       Observations/Objective: There were no vitals filed for this visit. There is no height or weight on file to calculate BMI.  Physical Exam  Constitutional: She is oriented to person, place, and time. No distress.  HENT:  Head: Normocephalic and atraumatic.  Pulmonary/Chest: Effort normal.  Neurological: She is alert and oriented to person, place, and time.  Psychiatric: Affect normal.    CBC    Component Value Date/Time   WBC 8.8 02/20/2019 1320   RBC 4.37 02/20/2019 1320   HGB 12.3 02/20/2019 1320   HCT 38.8 02/20/2019 1320   PLT 227 02/20/2019 1320   MCV 88.8 02/20/2019 1320   MCH 28.1 02/20/2019 1320   MCHC 31.7 02/20/2019 1320   RDW 14.9 02/20/2019 1320   LYMPHSABS 3.4 02/20/2019 1320   MONOABS 0.6 02/20/2019 1320   EOSABS 0.2 02/20/2019 1320   BASOSABS 0.0 02/20/2019 1320    CMP     Component Value Date/Time   NA 137 02/25/2019 1252   K 4.7 02/25/2019 1252   CL 103 02/25/2019 1252   CO2 24 02/25/2019 1252   GLUCOSE 101 (H) 02/25/2019 1252   BUN 43 (H) 02/25/2019 1252   CREATININE 0.94 02/25/2019 1252   CREATININE 0.91 08/01/2018 1458   CALCIUM 8.9 02/25/2019 1252   PROT 7.1 02/20/2019 1320   ALBUMIN 4.2 02/20/2019 1320   AST 16 02/20/2019  1320   ALT 15 02/20/2019 1320   ALKPHOS 67 02/20/2019 1320   BILITOT 0.6 02/20/2019 1320   GFRNONAA >60 02/25/2019 1252   GFRAA >60 02/25/2019 1252     Assessment and Plan: 1. Malignant neoplasm of lower-inner quadrant of right breast of female, estrogen receptor positive ([place])   2. AKI (acute kidney injury) ([place])   3. Aromatase inhibitor use     Stage IA breast cancer ER positive, PR negative, HER-2 negative. Continue Arimidex 1 mg daily.  Recommend calcium and vitamin supplements.  Annual diagnostic breast mammogram due in August 2020. Will obtain.   # Elevated creatinine, new problem for her. Advise her to avoid NSAIDS, encourage oral hydration.  will repeat BMP, check multiple myeloma panel, . Obtain US renal.  Follow up with PCP if persistently elevated, for medication adjust.   Annual diagnostic breast mammogram due in August 2020. Previously discussed about genetic testing.  Patient is not interested.   Renal US showed mild to moderate left hydronephrosis. Results communicated with PCP. Patient to follow up with PCP for further management. Urology evaluation.   Follow Up Instructions: 3 months.  Orders Placed This Encounter  Procedures  . US RENAL    Standing Status:   Future    Number of Occurrences:   1    Standing Expiration Date:   04/25/2020    Order Specific Question:   Reason for Exam (SYMPTOM  OR DIAGNOSIS REQUIRED)    Answer:   AKI    Order Specific Question:   Preferred imaging location?    Answer:   [facility]    Order Specific Question:   Call Results- Best Contact Number?    Answer:   [phone]----VWP pt go  . Basic metabolic panel    Standing Status:   Future    Number of Occurrences:   1    Standing Expiration Date:   02/24/2020  . Multiple Myeloma Panel (SPEP&IFE w/QIG)    Standing Status:   Future  Number of Occurrences:   1    Standing Expiration Date:   02/24/2020  . Kappa/lambda light chains    Standing Status:   Future     Number of Occurrences:   1    Standing Expiration Date:   02/24/2020    I discussed the assessment and treatment plan with the patient. The patient was provided an opportunity to ask questions and all were answered. The patient agreed with the plan and demonstrated an understanding of the instructions.  The patient was advised to call back or seek an in-person evaluation if the symptoms worsen or if the condition fails to improve as anticipated.  We spent sufficient time to discuss many aspect of care, questions were answered to patient's satisfaction. Total face to face encounter time for this patient visit was 25 min. >50% of the time was  spent in counseling and coordination of care.    Earlie Server, MD

## 2019-03-03 ENCOUNTER — Other Ambulatory Visit: Payer: Self-pay

## 2019-03-03 ENCOUNTER — Ambulatory Visit (INDEPENDENT_AMBULATORY_CARE_PROVIDER_SITE_OTHER): Payer: PPO

## 2019-03-03 DIAGNOSIS — Z0000 Encounter for general adult medical examination without abnormal findings: Secondary | ICD-10-CM

## 2019-03-03 NOTE — Patient Instructions (Addendum)
  Ms. Clarkston , Thank you for taking time to come for your Medicare Wellness Visit. I appreciate your ongoing commitment to your health goals. Please review the following plan we discussed and let me know if I can assist you in the future.   These are the goals we discussed: Goals      Patient Stated   . Follow up with Primary Care Provider (pt-stated)       This is a list of the screening recommended for you and due dates:  Health Maintenance  Topic Date Due  .  Hepatitis C: One time screening is recommended by Center for Disease Control  (CDC) for  adults born from [birth year] through 1966.   Mar 23, 1946  . Tetanus Vaccine  07/11/1964  . DEXA scan (bone density measurement)  07/11/2010  . Mammogram  11/01/2018  . Flu Shot  05/02/2019  . Colon Cancer Screening  05/06/2023  . Pneumonia vaccines  Completed

## 2019-03-03 NOTE — Progress Notes (Signed)
Subjective:   Tammy Choi is a 73 y.o. female who presents for Medicare Annual (Subsequent) preventive examination.  Review of Systems:  No ROS.  Medicare Wellness Virtual Visit.  Visual/audio telehealth visit, UTA vital signs.   See social history for additional risk factors.   Cardiac Risk Factors include: advanced age (>34men, >78 women);hypertension     Objective:     Vitals: There were no vitals taken for this visit.  There is no height or weight on file to calculate BMI.  Advanced Directives 03/03/2019 02/24/2019 11/26/2018 11/26/2018 09/29/2018 07/11/2018 06/16/2018  Does Patient Have a Medical Advance Directive? No No No No No No No  Would patient like information on creating a medical advance directive? No - Patient declined - No - Patient declined No - Patient declined - No - Patient declined No - Patient declined    Tobacco Social History   Tobacco Use  Smoking Status Former Smoker  . Packs/day: 1.00  . Years: 35.00  . Pack years: 35.00  . Types: Cigarettes  . Last attempt to quit: 12/20/2015  . Years since quitting: 3.2  Smokeless Tobacco Never Used     Counseling given: Not Answered   Clinical Intake:  Pre-visit preparation completed: Yes        Diabetes: No  How often do you need to have someone help you when you read instructions, pamphlets, or other written materials from your doctor or pharmacy?: 1 - Never  Interpreter Needed?: No     Past Medical History:  Diagnosis Date  . Breast cancer ([place]) 05/07/2018   T1a, N0; ER/ PR positive, Her 2 neu not overexpressed.   Marland Kitchen COPD (chronic obstructive pulmonary disease) (HCC)    Not on home o2  . Depression   . HTN (hypertension)   . Hyperlipemia    Past Surgical History:  Procedure Laterality Date  . ABDOMINAL HYSTERECTOMY    . BREAST BIOPSY Left 04/30/2013   neg core  . BREAST BIOPSY Right 05/07/2018   right breast stereo x clip INVASIVE MAMMARY CARCINOMA  . BREAST LUMPECTOMY WITH  SENTINEL LYMPH NODE BIOPSY Right 06/16/2018   Procedure: BREAST LUMPECTOMY WITH SENTINEL LYMPH NODE BX;  Surgeon: Robert Bellow, MD;  Location: [facility] ORS;  Service: General;  Laterality: Right;  . COLONOSCOPY  2014   Dr Bary Castilla  . Left Leg Surgery    . TONSILLECTOMY     Family History  Problem Relation Age of Onset  . Kidney disease Mother        deceased 55  . Heart disease Father        deceased 66  . Breast cancer Other 49       maternal half-sister; deceased 68  . Colon cancer Neg Hx    Social History   Socioeconomic History  . Marital status: Divorced    Spouse name: Not on file  . Number of children: Not on file  . Years of education: Not on file  . Highest education level: Not on file  Occupational History  . 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 Financial resource strain: Not hard at all  . Food insecurity:    Worry: Never true    Inability: Never true  . Transportation needs:    Medical: No    Non-medical: No  Tobacco Use  . Smoking status: Former Smoker    Packs/day: 1.00    Years: 35.00    Pack years: 35.00  Types: Cigarettes    Last attempt to quit: 12/20/2015    Years since quitting: 3.2  . Smokeless tobacco: Never Used  Substance and Sexual Activity  . Alcohol use: No    Alcohol/week: 0.0 standard drinks  . Drug use: No  . Sexual activity: Not on file  Lifestyle  . Physical activity:    Days per week: Not on file    Minutes per session: Not on file  . Stress: Not at all  Relationships  . Social connections:    Talks on phone: Not on file    Gets together: Not on file    Attends religious service: Not on file    Active member of club or organization: Not on file    Attends meetings of clubs or organizations: Not on file    Relationship status: Not on file  Other Topics Concern  . Not on file  Social History Narrative   Lives with daughter at home. Independent at baseline.    Outpatient  Encounter Medications as of 03/03/2019  Medication Sig  . acetaminophen (TYLENOL) 500 MG tablet Take 500-1,000 mg by mouth 2 (two) times daily as needed for moderate pain or headache.  . albuterol (PROAIR HFA) 108 (90 Base) MCG/ACT inhaler Inhale 2 puffs into the lungs every 6 (six) hours as needed for wheezing or shortness of breath.  . anastrozole (ARIMIDEX) 1 MG tablet TAKE 1 TABLET BY MOUTH ONCE DAILY*START AROUND 10/20/2018*  . budesonide-formoterol (SYMBICORT) 160-4.5 MCG/ACT inhaler Inhale 2 puffs into the lungs 2 (two) times daily.  . cholecalciferol (VITAMIN D) 1000 UNITS tablet Take 2,000 Units by mouth every other day.   Marland Kitchen FLUoxetine (PROZAC) 40 MG capsule Take 1 capsule (40 mg total) by mouth at bedtime.  . fluticasone (FLONASE) 50 MCG/ACT nasal spray Place 2 sprays into both nostrils daily.  . hydrochlorothiazide (HYDRODIURIL) 12.5 MG tablet Take 1 tablet by mouth once daily  . lisinopril (PRINIVIL,ZESTRIL) 40 MG tablet TAKE 1 TABLET BY MOUTH ONCE DAILY (Patient taking differently: Take 40 mg by mouth at bedtime. )  . Multiple Vitamins-Minerals (MULTIVITAMIN WITH MINERALS) tablet Take 1 tablet by mouth every other day.   . rosuvastatin (CRESTOR) 5 MG tablet TAKE 1 TABLET BY MOUTH ONCE DAILY  . trimethoprim-polymyxin b (POLYTRIM) ophthalmic solution Place 1 drop into the left eye every 4 (four) hours.   No facility-administered encounter medications on file as of 03/03/2019.     Activities of Daily Living In your present state of health, do you have any difficulty performing the following activities: 03/03/2019 06/10/2018  Hearing? N N  Vision? N N  Difficulty concentrating or making decisions? N N  Walking or climbing stairs? Y Y  Comment Hx of COPD -  Dressing or bathing? N N  Doing errands, shopping? N N  Preparing Food and eating ? N -  Using the Toilet? N -  In the past six months, have you accidently leaked urine? Y -  Comment Managed with brief at night -  Do you have  problems with loss of bowel control? N -  Managing your Medications? N -  Managing your Finances? N -  Housekeeping or managing your Housekeeping? N -  Some recent data might be hidden    Patient Care Team: Leone Haven, MD as PCP - General (Family Medicine) Bary Castilla [name], MD (General Surgery)    Assessment:   This is a routine wellness examination for Reyah.  I connected with patient 03/03/19 at 10:00 AM  EDT by a video/audio enabled telemedicine application and verified that I am speaking with the correct person using two identifiers. Patient stated full name and DOB. Patient gave permission to continue with virtual visit. Patient's location was at home and Nurse's location was at [place] office.   Notes Urological appointment scheduled in July.   Health Screenings  Mammogram - 05/2018 Colonoscopy - 05/2013 Bone Density - ordered 08/2018 Glaucoma -none Hearing -demonstrates normal hearing during visit. Hemoglobin A1C - 03/2018 Cholesterol - 03/2018 Dental- dentures Vision- she plans to schedule with her ophthalmologist.   Social  Alcohol intake - no         Smoking history- former    Smokers in home? none Illicit drug use? none Exercise - walking as tolerated Diet - regular Sexually Active -not currently BMI- discussed the importance of a healthy diet, water intake and the benefits of aerobic exercise.  Educational material provided.   Safety  Patient feels safe at home- yes Patient does have smoke detectors at home- yes Patient does wear sunscreen or protective clothing when in direct sunlight -yes Patient does wear seat belt when in a moving vehicle -yes  Covid-19 precautions and sickness symptoms discussed.   Activities of Daily Living Patient denies needing assistance with: driving, household chores, feeding themselves, getting from bed to chair, getting to the toilet, bathing/showering, dressing, managing money, or preparing meals.  No new identified  risk were noted.    Depression Screen Patient denies losing interest in daily life, feeling hopeless, or crying easily over simple problems.   Medication-taking as directed and without issues.   Fall Screen Patient denies being afraid of falling or falling in the last year.   Memory Screen Patient is alert.  Patient denies difficulty focusing, concentrating or misplacing items. Correctly identified the president of the [place], season and recall. Patient likes to read, plays computer games, and/or work puzzles for brain stimulation.  Immunizations The following Immunizations were discussed: Influenza, shingles, pneumonia, and tetanus.   Other Providers Patient Care Team: Leone Haven, MD as PCP - General (Family Medicine) Bary Castilla [name], MD (General Surgery)  Exercise Activities and Dietary recommendations Current Exercise Habits: Home exercise routine, Intensity: Mild  Goals      Patient Stated   . Follow up with Primary Care Provider (pt-stated)       Fall Risk Fall Risk  03/03/2019 10/10/2018 03/14/2018 04/13/2016 02/21/2016  Falls in the past year? 0 0 No No No  Number falls in past yr: - 0 - - -  Injury with Fall? - 0 - - -   Depression Screen PHQ 2/9 Scores 03/03/2019 10/10/2018 04/13/2016 02/21/2016  PHQ - 2 Score 0 0 0 0     Cognitive Function     6CIT Screen 03/03/2019  What Year? 0 points  What month? 0 points  What time? 0 points  Count back from 20 0 points  Months in reverse 0 points  Repeat phrase 0 points  Total Score 0    Immunization History  Administered Date(s) Administered  . Influenza Split 08/07/2011, 07/16/2012  . Influenza, High Dose Seasonal PF 07/10/2016, 08/01/2017, 07/10/2018  . Influenza,inj,Quad PF,6+ Mos 07/30/2014, 07/21/2015  . Influenza-Unspecified 07/27/2013  . Pneumococcal Conjugate-13 03/30/2014  . Pneumococcal Polysaccharide-23 07/16/2010   Screening Tests Health Maintenance  Topic Date Due  . Hepatitis C Screening   [birth year]  . TETANUS/TDAP  07/11/1964  . DEXA SCAN  07/11/2010  . MAMMOGRAM  11/01/2018  . INFLUENZA VACCINE  05/02/2019  .  COLONOSCOPY  05/06/2023  . PNA vac Low Risk Adult  Completed      Plan:    End of life planning; Advance aging; Advanced directives discussed.  Copy of current HCPOA/Living Will requested.    I have personally reviewed and noted the following in the patient's chart:   . Medical and social history . Use of alcohol, tobacco or illicit drugs  . Current medications and supplements . Functional ability and status . Nutritional status . Physical activity . Advanced directives . List of other physicians . Hospitalizations, surgeries, and ER visits in previous 12 months . Vitals . Screenings to include cognitive, depression, and falls . Referrals and appointments  In addition, I have reviewed and discussed with patient certain preventive protocols, quality metrics, and best practice recommendations. A written personalized care plan for preventive services as well as general preventive health recommendations were provided to patient.     Varney Biles, LPN  [id number]/[id number]

## 2019-03-05 ENCOUNTER — Other Ambulatory Visit: Payer: Self-pay | Admitting: Family Medicine

## 2019-03-09 ENCOUNTER — Other Ambulatory Visit: Payer: Self-pay | Admitting: Family Medicine

## 2019-03-09 DIAGNOSIS — N1330 Unspecified hydronephrosis: Secondary | ICD-10-CM

## 2019-03-09 NOTE — Progress Notes (Signed)
I have reviewed the above note and agree.  Lc Joynt, M.D.  

## 2019-03-20 ENCOUNTER — Ambulatory Visit: Payer: PPO

## 2019-03-26 ENCOUNTER — Ambulatory Visit
Admission: RE | Admit: 2019-03-26 | Discharge: 2019-03-26 | Disposition: A | Discharge status: Home / Self Care | Payer: PPO | Source: Ambulatory Visit | Attending: Family Medicine | Admitting: Family Medicine

## 2019-03-26 ENCOUNTER — Other Ambulatory Visit: Payer: Self-pay

## 2019-03-26 DIAGNOSIS — K449 Diaphragmatic hernia without obstruction or gangrene: Secondary | ICD-10-CM | POA: Diagnosis not present

## 2019-03-26 DIAGNOSIS — K5730 Diverticulosis of large intestine without perforation or abscess without bleeding: Secondary | ICD-10-CM | POA: Diagnosis not present

## 2019-03-26 DIAGNOSIS — K429 Umbilical hernia without obstruction or gangrene: Secondary | ICD-10-CM | POA: Diagnosis not present

## 2019-03-26 DIAGNOSIS — N1330 Unspecified hydronephrosis: Secondary | ICD-10-CM | POA: Diagnosis not present

## 2019-03-30 ENCOUNTER — Other Ambulatory Visit: Payer: Self-pay | Admitting: *Deleted

## 2019-03-30 DIAGNOSIS — C50311 Malignant neoplasm of lower-inner quadrant of right female breast: Secondary | ICD-10-CM

## 2019-03-30 DIAGNOSIS — Z170 Estrogen receptor positive status [ER+]: Secondary | ICD-10-CM

## 2019-04-06 ENCOUNTER — Telehealth: Payer: Self-pay | Admitting: Family Medicine

## 2019-04-06 NOTE — Telephone Encounter (Unsigned)
Copied from [place] [phone]. Topic: Quick Communication - Lab Results (Clinic Use ONLY) >> Apr 06, 2019  9:31 AM Gordy Councilman, CMA wrote: Called patient to inform them of 03/26/2019 lab results. When patient returns call, triage nurse may disclose results.

## 2019-04-07 ENCOUNTER — Telehealth: Payer: Self-pay | Admitting: Family Medicine

## 2019-04-07 NOTE — Telephone Encounter (Signed)
Pt called and left message to return call for lab results. Unable to document in result note.

## 2019-04-07 NOTE — Telephone Encounter (Signed)
Reviewed ct results and physician's comments with patient. She has an appointment with the urologist on 7/16. No further questions.

## 2019-04-07 NOTE — Telephone Encounter (Signed)
Called and gave patient her CT results, pt understood.  Opal Dinning,cma

## 2019-04-16 ENCOUNTER — Ambulatory Visit (INDEPENDENT_AMBULATORY_CARE_PROVIDER_SITE_OTHER): Payer: PPO | Admitting: Urology

## 2019-04-16 ENCOUNTER — Encounter: Payer: Self-pay | Admitting: Urology

## 2019-04-16 ENCOUNTER — Other Ambulatory Visit: Payer: Self-pay

## 2019-04-16 VITALS — BP 112/70 | HR 90 | Ht 67.0 in | Wt 180.0 lb

## 2019-04-16 DIAGNOSIS — N1330 Unspecified hydronephrosis: Secondary | ICD-10-CM | POA: Diagnosis not present

## 2019-04-16 LAB — URINALYSIS, COMPLETE
Bilirubin, UA: NEGATIVE
Glucose, UA: NEGATIVE
Ketones, UA: NEGATIVE
Leukocytes,UA: NEGATIVE
Nitrite, UA: NEGATIVE
Protein,UA: NEGATIVE
RBC, UA: NEGATIVE
Specific Gravity, UA: 1.02 (ref 1.005–1.030)
Urobilinogen, Ur: 0.2 mg/dL (ref 0.2–1.0)
pH, UA: 5 (ref 5.0–7.5)

## 2019-04-16 LAB — MICROSCOPIC EXAMINATION
Bacteria, UA: NONE SEEN
RBC, Urine: NONE SEEN /hpf (ref 0–2)

## 2019-04-16 NOTE — Progress Notes (Addendum)
04/16/19 1:39 PM   Tammy Choi [birth year] [id number]  Referring provider: Leone Haven, MD [address] [place],  [place] [address]  CC: Bilateral hydronephrosis  HPI: I saw [name] in urology clinic today in consultation for bilateral hydronephrosis from Dr. Caryl Bis.  She is a 73 year old female with past medical history notable for breast cancer and COPD who was recently found to have multiple elevated creatinine values of around 1.3 in spring 2020.  Of note, this was after a 1 month bout with a severe illness with diarrhea, cough, and fevers.  A repeat creatinine on 02/25/2019 was normal at 0.94, with EGFR greater than 60.  This is stable from her baseline creatinine of 0.9.  Her elevated creatinine prompted a renal ultrasound which showed mild right and mild to moderate left hydronephrosis.  A follow-up CT stone protocol was performed on 03/26/2019 which showed no ureteral or renal stones, however there was moderate left hydronephrosis and right mild hydronephrosis.  There was no dilation of the ureters bilaterally.  There is no prior imaging to evaluate duration of these findings.  She is completely asymptomatic and denies any flank pain, gross hematuria, or weight loss.  There is no family history of urologic malignancies.  She has a 30-pack-year smoking history and quit 3 years ago.  She previously worked in [employer] and denies any other carcinogenic exposures.  She has mild urinary symptoms of frequency and weak stream, but drinks 3 to 4 cups of coffee during the day.  PVR was 150 cc in clinic today.  There are no aggravating or alleviating factors.  Severity is moderate.  She has no history of microscopic hematuria on chart review.   PMH: Past Medical History:  Diagnosis Date  . Breast cancer ([place]) 05/07/2018   T1a, N0; ER/ PR positive, Her 2 neu not overexpressed.   Marland Kitchen COPD (chronic obstructive pulmonary disease) (HCC)    Not on home o2  . Depression    . HTN (hypertension)   . Hyperlipemia     Surgical History: Past Surgical History:  Procedure Laterality Date  . ABDOMINAL HYSTERECTOMY    . BREAST BIOPSY Left 04/30/2013   neg core  . BREAST BIOPSY Right 05/07/2018   right breast stereo x clip INVASIVE MAMMARY CARCINOMA  . BREAST LUMPECTOMY WITH SENTINEL LYMPH NODE BIOPSY Right 06/16/2018   Procedure: BREAST LUMPECTOMY WITH SENTINEL LYMPH NODE BX;  Surgeon: Robert Bellow, MD;  Location: [facility] ORS;  Service: General;  Laterality: Right;  . COLONOSCOPY  2014   Dr Bary Castilla  . Left Leg Surgery    . TONSILLECTOMY      Allergies:  Allergies  Allergen Reactions  . Penicillins Anaphylaxis and Other (See Comments)    Has patient had a PCN reaction causing immediate rash, facial/tongue/throat swelling, SOB or lightheadedness with hypotension: Yes Has patient had a PCN reaction causing severe rash involving mucus membranes or skin necrosis: No Has patient had a PCN reaction that required hospitalization No Has patient had a PCN reaction occurring within the last 10 years: No If all of the above answers are "NO", then may proceed with Cephalosporin use.    Family History: Family History  Problem Relation Age of Onset  . Kidney disease Mother        deceased 56  . Heart disease Father        deceased 11  . Breast cancer Other 18       maternal half-sister; deceased 31  .  Colon cancer Neg Hx     Social History:  reports that she quit smoking about 3 years ago. Her smoking use included cigarettes. She has a 35.00 pack-year smoking history. She has never used smokeless tobacco. She reports that she does not drink alcohol or use drugs.  ROS: Please see flowsheet from today's date for complete review of systems.  Physical Exam: BP 112/70   Pulse 90   Ht 5' 7"  (1.702 m)   Wt 180 lb (81.6 kg)   BMI 28.19 kg/m    Constitutional:  Alert and oriented, No acute distress. Cardiovascular: No clubbing, cyanosis, or edema.  Respiratory: Normal respiratory effort, no increased work of breathing. GI: Abdomen is soft, nontender, nondistended, no abdominal masses GU: No CVA tenderness Lymph: No cervical or inguinal lymphadenopathy. Skin: No rashes, bruises or suspicious lesions. Neurologic: Grossly intact, no focal deficits, moving all 4 extremities. Psychiatric: Normal mood and affect.  Laboratory Data: Reviewed  Pertinent Imaging: I have personally reviewed the CT stone protocol and renal ultrasound, see HPI for details  Assessment & Plan:   In summary, the patient is a 73 year old female who was found to have an AKI in spring 2020 after a severe bout of diarrheal illness which prompted a renal ultrasound which showed mild right hydronephrosis and moderate left hydronephrosis.  A follow-up CT stone protocol confirmed these findings with no obstructing ureteral stones.  There is no prior imaging to evaluate duration of these findings.  She is completely asymptomatic, and renal function has returned to baseline with creatinine of 0.94, EGFR greater than 60.  I suspect her initial AKI was pre-renal and secondary to dehydration from her diarrheal illness.  We discussed possible etiologies including chronic UPJ obstructions bilaterally versus urothelial lesions including malignancy which would be much less likely.  Options for further investigation would include a CT urogram to evaluate for filling defects with ongoing monitoring of the renal function and likely yearly renal ultrasounds, versus more invasive investigation with cystoscopy, bilateral retrograde pyelograms, possible ureteroscopy, possible biopsy.  She would like to proceed with CT urogram to evaluate for any urothelial filling defects which is very reasonable.  Will call with CT urogram results  Billey Co, MD  [place] [address], [place], [place] [address] [phone]

## 2019-04-20 ENCOUNTER — Encounter: Payer: Self-pay | Admitting: General Surgery

## 2019-04-23 ENCOUNTER — Ambulatory Visit: Payer: PPO | Admitting: Radiation Oncology

## 2019-04-27 ENCOUNTER — Other Ambulatory Visit: Payer: Self-pay

## 2019-04-27 DIAGNOSIS — C50311 Malignant neoplasm of lower-inner quadrant of right female breast: Secondary | ICD-10-CM

## 2019-04-28 ENCOUNTER — Other Ambulatory Visit: Payer: Self-pay | Admitting: Family Medicine

## 2019-05-19 ENCOUNTER — Other Ambulatory Visit: Payer: PPO

## 2019-05-24 ENCOUNTER — Other Ambulatory Visit: Payer: Self-pay | Admitting: Oncology

## 2019-05-26 ENCOUNTER — Other Ambulatory Visit: Payer: Self-pay

## 2019-05-26 ENCOUNTER — Inpatient Hospital Stay: Payer: PPO | Attending: Oncology | Admitting: Oncology

## 2019-05-26 ENCOUNTER — Telehealth: Payer: Self-pay

## 2019-05-26 ENCOUNTER — Inpatient Hospital Stay: Payer: PPO

## 2019-05-26 ENCOUNTER — Encounter: Payer: Self-pay | Admitting: Oncology

## 2019-05-26 VITALS — BP 143/65 | HR 91 | Temp 97.7°F | Resp 18 | Wt 186.4 lb

## 2019-05-26 DIAGNOSIS — R234 Changes in skin texture: Secondary | ICD-10-CM

## 2019-05-26 DIAGNOSIS — Z803 Family history of malignant neoplasm of breast: Secondary | ICD-10-CM | POA: Diagnosis not present

## 2019-05-26 DIAGNOSIS — F329 Major depressive disorder, single episode, unspecified: Secondary | ICD-10-CM | POA: Diagnosis not present

## 2019-05-26 DIAGNOSIS — M79629 Pain in unspecified upper arm: Secondary | ICD-10-CM | POA: Insufficient documentation

## 2019-05-26 DIAGNOSIS — N1339 Other hydronephrosis: Secondary | ICD-10-CM | POA: Insufficient documentation

## 2019-05-26 DIAGNOSIS — J449 Chronic obstructive pulmonary disease, unspecified: Secondary | ICD-10-CM | POA: Diagnosis not present

## 2019-05-26 DIAGNOSIS — M79621 Pain in right upper arm: Secondary | ICD-10-CM

## 2019-05-26 DIAGNOSIS — C50311 Malignant neoplasm of lower-inner quadrant of right female breast: Secondary | ICD-10-CM

## 2019-05-26 DIAGNOSIS — Z7951 Long term (current) use of inhaled steroids: Secondary | ICD-10-CM | POA: Insufficient documentation

## 2019-05-26 DIAGNOSIS — Z79899 Other long term (current) drug therapy: Secondary | ICD-10-CM | POA: Insufficient documentation

## 2019-05-26 DIAGNOSIS — Z87891 Personal history of nicotine dependence: Secondary | ICD-10-CM | POA: Diagnosis not present

## 2019-05-26 DIAGNOSIS — N179 Acute kidney failure, unspecified: Secondary | ICD-10-CM

## 2019-05-26 DIAGNOSIS — Z170 Estrogen receptor positive status [ER+]: Secondary | ICD-10-CM

## 2019-05-26 DIAGNOSIS — Z79811 Long term (current) use of aromatase inhibitors: Secondary | ICD-10-CM

## 2019-05-26 DIAGNOSIS — I10 Essential (primary) hypertension: Secondary | ICD-10-CM | POA: Diagnosis not present

## 2019-05-26 DIAGNOSIS — E785 Hyperlipidemia, unspecified: Secondary | ICD-10-CM | POA: Insufficient documentation

## 2019-05-26 LAB — CBC WITH DIFFERENTIAL/PLATELET
Abs Immature Granulocytes: 0.03 10*3/uL (ref 0.00–0.07)
Basophils Absolute: 0 10*3/uL (ref 0.0–0.1)
Basophils Relative: 1 %
Eosinophils Absolute: 0.1 10*3/uL (ref 0.0–0.5)
Eosinophils Relative: 1 %
HCT: 38.9 % (ref 36.0–46.0)
Hemoglobin: 12 g/dL (ref 12.0–15.0)
Immature Granulocytes: 0 %
Lymphocytes Relative: 38 %
Lymphs Abs: 2.9 10*3/uL (ref 0.7–4.0)
MCH: 27.6 pg (ref 26.0–34.0)
MCHC: 30.8 g/dL (ref 30.0–36.0)
MCV: 89.6 fL (ref 80.0–100.0)
Monocytes Absolute: 0.5 10*3/uL (ref 0.1–1.0)
Monocytes Relative: 7 %
Neutro Abs: 4.1 10*3/uL (ref 1.7–7.7)
Neutrophils Relative %: 53 %
Platelets: 226 10*3/uL (ref 150–400)
RBC: 4.34 MIL/uL (ref 3.87–5.11)
RDW: 13.9 % (ref 11.5–15.5)
WBC: 7.7 10*3/uL (ref 4.0–10.5)
nRBC: 0 % (ref 0.0–0.2)

## 2019-05-26 LAB — COMPREHENSIVE METABOLIC PANEL
ALT: 16 U/L (ref 0–44)
AST: 17 U/L (ref 15–41)
Albumin: 4.2 g/dL (ref 3.5–5.0)
Alkaline Phosphatase: 72 U/L (ref 38–126)
Anion gap: 7 (ref 5–15)
BUN: 32 mg/dL — ABNORMAL HIGH (ref 8–23)
CO2: 29 mmol/L (ref 22–32)
Calcium: 9.3 mg/dL (ref 8.9–10.3)
Chloride: 103 mmol/L (ref 98–111)
Creatinine, Ser: 0.86 mg/dL (ref 0.44–1.00)
GFR calc Af Amer: >60 mL/min (ref >60)
GFR calc non Af Amer: >60 mL/min (ref >60)
Glucose, Bld: 107 mg/dL — ABNORMAL HIGH (ref 70–99)
Potassium: 4.5 mmol/L (ref 3.5–5.1)
Sodium: 139 mmol/L (ref 135–145)
Total Bilirubin: 0.5 mg/dL (ref 0.3–1.2)
Total Protein: 7.3 g/dL (ref 6.5–8.1)

## 2019-05-26 NOTE — Progress Notes (Signed)
Patient here today for follow up. Patient denies any decrease in appetite, nausea, vomiting, diarrhea, constipation or pain.  Patient states that she does have intermittent soreness under right arm from time to time.

## 2019-05-26 NOTE — Telephone Encounter (Signed)
Patient notified 06/01/2019 @ 4 15 pm appointment. Verbalized understanding and appreciative.

## 2019-05-26 NOTE — Progress Notes (Signed)
Hematology/Oncology follow up note [facility] [phone] [phone]   Patient Care Team: Leone Haven, MD as PCP - General (Family Medicine) Bary Castilla, [name], MD (General Surgery)  REFERRING PROVIDER: Dr. Bary Castilla REASON FOR VISIT:  Evaluation of breast cancer  HISTORY OF PRESENTING ILLNESS:  Tammy Choi is a  73 y.o.  female with PMH listed below who was referred to me for evaluation of newly diagnosed breast cancer. Patient had mammogram 05/01/2018 which showed indeterminate microcalcification in the medial right breast measuring 0.9 x 0.7 x 0.7 cm Biopsy pathology showed: Invasive mammary carcinoma, DCIS, calcification associated with DCIS, grade 2, ER positive PR negative, HER-2 negative (IHC 1+)  Nipple discharge: Denies Family history: Sister passed away from breast cancer History of radiation to chest: denies.  Previous breast surgery: Left breast biopsy with benign etiology. . 06/16/2018, right breast lumpectomy and sentinel lymph node biopsy showed residual invasive mammary carcinoma, residual ductal carcinoma in situ, biopsy site changes and metallic clip.  Incidental 5 mm fibroadenoma.  All 3 sentinel lymph node negative. ER positive HER2 negative.  Patient underwent adjuvant radiation finished in January 2020. Started aromatase inhibitor with Arimidex 1 mg daily since 1/20/ 2020.  INTERVAL HISTORY Tammy Choi is a 73 y.o. female who has above history reviewed by me today presents for follow up visit for management of Stage Tammy Breast cancer, Estrogen receptor positive, HER 2 negative.   Patient has been Arimidex 1 mg daily since 10/20/2018.  She tolerates it. She noticed some soreness in the right axillary area for a few weeks, no exacerbating or alleviating factors..  Also she has noticed that she is concerned about right breast skin thickening,  Denies significant increase of body pain or joint pain. She is  due for mammogram in August 2020.  She has an appointment coming up.  During the interval patient also followed up with urology Dr. Caprice Beaver for bilateral hydronephrosis. Patient was recommended to have CT urogram done for evaluation of filling defects with monitoring kidney function versus more invasive investigation with cystoscopy, bilateral retrograde pyelograms and possible ureteroscopy possible biopsy. She will choose to have a image surveillance with monitoring of kidney function.   Review of Systems  Constitutional: Negative for chills, fever, malaise/fatigue and weight loss.  HENT: Negative for nosebleeds and sore throat.   Eyes: Negative for double vision, photophobia and redness.  Respiratory: Negative for cough, shortness of breath and wheezing.   Cardiovascular: Negative for chest pain, palpitations, orthopnea and leg swelling.  Gastrointestinal: Negative for abdominal pain, blood in stool, nausea and vomiting.  Genitourinary: Negative for dysuria.  Musculoskeletal: Negative for back pain, myalgias and neck pain.  Skin: Negative for itching and rash.  Neurological: Negative for dizziness, tingling and tremors.  Endo/Heme/Allergies: Negative for environmental allergies. Does not bruise/bleed easily.  Psychiatric/Behavioral: Negative for depression and hallucinations. The patient is not nervous/anxious.     MEDICAL HISTORY:  Past Medical History:  Diagnosis Date  . Breast cancer ([place]) 05/07/2018   T1a, N0; ER/ PR positive, Her 2 neu not overexpressed.   Marland Kitchen COPD (chronic obstructive pulmonary disease) (HCC)    Not on home o2  . Depression   . HTN (hypertension)   . Hyperlipemia     SURGICAL HISTORY: Past Surgical History:  Procedure Laterality Date  . ABDOMINAL HYSTERECTOMY    . BREAST BIOPSY Left 04/30/2013   neg core  . BREAST BIOPSY Right 05/07/2018   right breast stereo x clip INVASIVE MAMMARY  CARCINOMA  . BREAST LUMPECTOMY WITH SENTINEL LYMPH NODE BIOPSY Right  06/16/2018   Procedure: BREAST LUMPECTOMY WITH SENTINEL LYMPH NODE BX;  Surgeon: Robert Bellow, MD;  Location: [facility] ORS;  Service: General;  Laterality: Right;  . COLONOSCOPY  2014   Dr Bary Castilla  . Left Leg Surgery    . TONSILLECTOMY      SOCIAL HISTORY: Social History   Socioeconomic History  . Marital status: Divorced    Spouse name: Not on file  . Number of children: Not on file  . Years of education: Not on file  . Highest education level: Not on file  Occupational History  . 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 Financial resource strain: Not hard at all  . Food insecurity    Worry: Never true    Inability: Never true  . Transportation needs    Medical: No    Non-medical: No  Tobacco Use  . Smoking status: Former Smoker    Packs/day: 1.00    Years: 35.00    Pack years: 35.00    Types: Cigarettes    Quit date: 12/20/2015    Years since quitting: 3.4  . Smokeless tobacco: Never Used  Substance and Sexual Activity  . Alcohol use: No    Alcohol/week: 0.0 standard drinks  . Drug use: No  . Sexual activity: Not on file  Lifestyle  . Physical activity    Days per week: Not on file    Minutes per session: Not on file  . Stress: Not at all  Relationships  . Social Herbalist on phone: Not on file    Gets together: Not on file    Attends religious service: Not on file    Active member of club or organization: Not on file    Attends meetings of clubs or organizations: Not on file    Relationship status: Not on file  . Intimate partner violence    Fear of current or ex partner: Not on file    Emotionally abused: Not on file    Physically abused: Not on file    Forced sexual activity: Not on file  Other Topics Concern  . Not on file  Social History Narrative   Lives with daughter at home. Independent at baseline.    FAMILY HISTORY: Family History  Problem Relation Age of Onset  . Kidney disease Mother         deceased 54  . Heart disease Father        deceased 65  . Breast cancer Other 50       maternal half-sister; deceased 1  . Colon cancer Neg Hx     ALLERGIES:  is allergic to penicillins.  MEDICATIONS:  Current Outpatient Medications  Medication Sig Dispense Refill  . acetaminophen (TYLENOL) 500 MG tablet Take 500-1,000 mg by mouth 2 (two) times daily as needed for moderate pain or headache.    . albuterol (PROAIR HFA) 108 (90 Base) MCG/ACT inhaler Inhale 2 puffs into the lungs every 6 (six) hours as needed for wheezing or shortness of breath. 1 Inhaler 2  . anastrozole (ARIMIDEX) 1 MG tablet Take 1 tablet by mouth once daily 90 tablet 0  . budesonide-formoterol (SYMBICORT) 160-4.5 MCG/ACT inhaler Inhale 2 puffs into the lungs 2 (two) times daily. 1 Inhaler 5  . cholecalciferol (VITAMIN D) 1000 UNITS tablet Take 2,000 Units by mouth every other day.     Marland Kitchen  FLUoxetine (PROZAC) 40 MG capsule Take 1 capsule (40 mg total) by mouth at bedtime. 90 capsule 3  . fluticasone (FLONASE) 50 MCG/ACT nasal spray Place 2 sprays into both nostrils daily. 16 g 6  . hydrochlorothiazide (HYDRODIURIL) 12.5 MG tablet Take 1 tablet by mouth once daily 90 tablet 0  . lisinopril (ZESTRIL) 40 MG tablet Take 1 tablet by mouth once daily 90 tablet 0  . Multiple Vitamins-Minerals (MULTIVITAMIN WITH MINERALS) tablet Take 1 tablet by mouth every other day.     . rosuvastatin (CRESTOR) 5 MG tablet TAKE 1 TABLET BY MOUTH ONCE DAILY 90 tablet 3  . trimethoprim-polymyxin b (POLYTRIM) ophthalmic solution Place 1 drop into the left eye every 4 (four) hours. 10 mL 0   No current facility-administered medications for this visit.      PHYSICAL EXAMINATION: ECOG PERFORMANCE STATUS: 0 - Asymptomatic Vitals:   05/26/19 1322  BP: (!) 143/65  Pulse: 91  Resp: 18  Temp: 97.7 F (36.5 C)  SpO2: 99%   Filed Weights   05/26/19 1322  Weight: 186 lb 7 oz (84.6 kg)    Physical Exam Constitutional:      General: She  is not in acute distress. HENT:     Head: Normocephalic and atraumatic.  Eyes:     General: No scleral icterus.    Pupils: Pupils are equal, round, and reactive to light.  Neck:     Musculoskeletal: Normal range of motion and neck supple.  Cardiovascular:     Rate and Rhythm: Normal rate and regular rhythm.     Heart sounds: Normal heart sounds.  Pulmonary:     Effort: Pulmonary effort is normal. No respiratory distress.     Breath sounds: No wheezing.  Abdominal:     General: Bowel sounds are normal. There is no distension.     Palpations: Abdomen is soft. There is no mass.     Tenderness: There is no abdominal tenderness.  Musculoskeletal: Normal range of motion.        General: No deformity.  Skin:    General: Skin is warm and dry.     Findings: No erythema or rash.  Neurological:     Mental Status: She is alert and oriented to person, place, and time.     Cranial Nerves: No cranial nerve deficit.     Coordination: Coordination normal.  Psychiatric:        Behavior: Behavior normal.        Thought Content: Thought content normal.   Breast exam was performed in seated and lying down position. Patient is status post lumpectomy with a well-healed surgical scar.  Right axillary palpable tender small cystic mass.  Right breast skin hyperpigmentation, skin thickening, warmth.   LABORATORY DATA:  I have reviewed the data as listed Lab Results  Component Value Date   WBC 7.7 05/26/2019   HGB 12.0 05/26/2019   HCT 38.9 05/26/2019   MCV 89.6 05/26/2019   PLT 226 05/26/2019   Recent Labs    11/26/18 1350  02/20/19 1320 02/25/19 1252 05/26/19 1253  NA 138   < > 139 137 139  K 4.4   < > 5.6* 4.7 4.5  CL 100   < > 104 103 103  CO2 28   < > 27 24 29   GLUCOSE 109*   < > 112* 101* 107*  BUN 40*   < > 47* 43* 32*  CREATININE 1.20*   < > 1.46* 0.94 0.86  CALCIUM 8.9   < >  9.0 8.9 9.3  GFRNONAA 45*   < > 35* >60 >60  GFRAA 52*   < > 41* >60 >60  PROT 7.3  --  7.1  --  7.3   ALBUMIN 3.3*  --  4.2  --  4.2  AST 17  --  16  --  17  ALT 25  --  15  --  16  ALKPHOS 81  --  67  --  72  BILITOT 0.4  --  0.6  --  0.5   < > = values in this interval not displayed.   Iron/TIBC/Ferritin/ %Sat No results found for: IRON, TIBC, FERRITIN, IRONPCTSAT   RADIOGRAPHIC STUDIES: I have personally reviewed the radiological images as listed and agreed with the findings in the report.   ASSESSMENT & PLAN:  1. Malignant neoplasm of lower-inner quadrant of right breast of female, estrogen receptor positive ([place])   2. Aromatase inhibitor use   3. Other hydronephrosis   4. Breast skin changes   5. Axillary pain, right   Cancer Staging Malignant neoplasm of lower-inner quadrant of right breast of female, estrogen receptor positive ([facility]) Staging form: Breast, AJCC 8th Edition - Clinical: No stage assigned - Unsigned - Pathologic stage from 09/29/2018: Stage Tammy (pT1a, pN0, cM0, G2, ER+, PR-, HER2-) - Signed by Earlie Server, MD on 09/29/2018  Stage Tammy breast cancer ER positive, PR negative, HER-2 negative. Clinically doing well. Continue Arimidex 1 mg daily. Labs are reviewed and discussed with patient. Recommend patient to continue take calcium and vitamin D supplementation. We will discuss about obtaining baseline bone density at next visit.  #Right breast skin hyperpigmentation/skin thickening most likely secondary to radiation changes. Right axillary soreness.  She has noticed symptoms for at least a few weeks Afebrile.  Will refer to surgery for further evaluation for possible mastitis.  Discussed with Dr. Perrin Maltese patient will be seen next Monday   #Bilateral kidney hydronephrosis, continue follow-up with urology.  Labs are reviewed and discussed with patient. Stable kidney function.   All questions were answered. The patient knows to call the clinic with any problems questions or concerns. Return of visit: 3 months for labs, MD reassessment.  Earlie Server, MD, PhD  Hematology Oncology [facility] Pager- [phone] 05/26/2019

## 2019-05-27 ENCOUNTER — Ambulatory Visit: Payer: PPO | Admitting: Surgery

## 2019-05-29 ENCOUNTER — Telehealth: Payer: Self-pay | Admitting: *Deleted

## 2019-05-29 NOTE — Telephone Encounter (Signed)
Copied from [place] [phone]. Topic: Appointment Scheduling - Scheduling Inquiry for Clinic >> May 29, 2019  2:59 PM Scherrie Gerlach wrote: Reason for CRM: pt called to go over screening questions. Pt unable to hold any longer. Please call her back. Thanks!

## 2019-06-01 ENCOUNTER — Ambulatory Visit (INDEPENDENT_AMBULATORY_CARE_PROVIDER_SITE_OTHER): Payer: PPO | Admitting: Surgery

## 2019-06-01 ENCOUNTER — Ambulatory Visit (INDEPENDENT_AMBULATORY_CARE_PROVIDER_SITE_OTHER): Payer: PPO | Admitting: Family Medicine

## 2019-06-01 ENCOUNTER — Other Ambulatory Visit: Payer: Self-pay

## 2019-06-01 ENCOUNTER — Encounter: Payer: Self-pay | Admitting: Surgery

## 2019-06-01 ENCOUNTER — Encounter: Payer: Self-pay | Admitting: Family Medicine

## 2019-06-01 VITALS — BP 120/70 | HR 95 | Temp 99.4°F | Ht 63.5 in | Wt 184.2 lb

## 2019-06-01 VITALS — BP 168/82 | HR 91 | Temp 97.6°F | Ht 63.5 in | Wt 184.2 lb

## 2019-06-01 DIAGNOSIS — J449 Chronic obstructive pulmonary disease, unspecified: Secondary | ICD-10-CM

## 2019-06-01 DIAGNOSIS — C50311 Malignant neoplasm of lower-inner quadrant of right female breast: Secondary | ICD-10-CM

## 2019-06-01 DIAGNOSIS — R600 Localized edema: Secondary | ICD-10-CM | POA: Diagnosis not present

## 2019-06-01 DIAGNOSIS — R7303 Prediabetes: Secondary | ICD-10-CM

## 2019-06-01 DIAGNOSIS — Z170 Estrogen receptor positive status [ER+]: Secondary | ICD-10-CM

## 2019-06-01 DIAGNOSIS — Z23 Encounter for immunization: Secondary | ICD-10-CM

## 2019-06-01 DIAGNOSIS — N1330 Unspecified hydronephrosis: Secondary | ICD-10-CM

## 2019-06-01 DIAGNOSIS — I10 Essential (primary) hypertension: Secondary | ICD-10-CM | POA: Diagnosis not present

## 2019-06-01 LAB — LIPID PANEL
Cholesterol: 155 mg/dL (ref 0–200)
HDL: 71.6 mg/dL (ref >39.00)
LDL Cholesterol: 66 mg/dL (ref 0–99)
NonHDL: 83.59
Total CHOL/HDL Ratio: 2
Triglycerides: 88 mg/dL (ref 0.0–149.0)
VLDL: 17.6 mg/dL (ref 0.0–40.0)

## 2019-06-01 LAB — TSH: TSH: 1.24 u[IU]/mL (ref 0.35–4.50)

## 2019-06-01 LAB — BRAIN NATRIURETIC PEPTIDE: Pro B Natriuretic peptide (BNP): 30 pg/mL (ref 0.0–100.0)

## 2019-06-01 LAB — HEMOGLOBIN A1C: Hgb A1c MFr Bld: 6.3 % (ref 4.6–6.5)

## 2019-06-01 MED ORDER — BUDESONIDE-FORMOTEROL FUMARATE 160-4.5 MCG/ACT IN AERO: 2.0000 | INHALATION_SPRAY | Freq: Two times a day (BID) | RESPIRATORY_TRACT | 5 refills | Status: DC

## 2019-06-01 NOTE — Progress Notes (Signed)
[name], MD Phone: [phone]  Tammy Choi is a 73 y.o. female who presents today for follow-up.  Hypertension: Taking hydrochlorothiazide and lisinopril.  No chest pain.  She has chronic dyspnea on exertion related to COPD.  Edema noted in bilateral feet at times for the last several weeks.  Bilateral lower extremity edema: Notes in bilateral feet and ankles left greater than right recently though previously right greater than left she has had some swelling.  This has been going on a few weeks.  It will go down some overnight.  No orthopnea or PND.   Breast cancer: Patient saw oncology last week.  Patient notes that her right breast tissue is denser than they would expect and that she may have a cyst or some other area that is causing soreness.  She is seeing the general surgeon later today for evaluation to determine appropriate diagnostic evaluation and management.  She has had no issues with the site of her surgery.  She does not feel she can do a mammogram at this time given the discomfort.  Hydronephrosis: She did see urology.  They are planning on doing a CT urogram to evaluate for any urothelial filling defects.  There has not been an identified cause yet.  She does note some leakage of urine when she stands.  Social History   Tobacco Use  Smoking Status Former Smoker  . Packs/day: 1.00  . Years: 35.00  . Pack years: 35.00  . Types: Cigarettes  . Quit date: 12/20/2015  . Years since quitting: 3.4  Smokeless Tobacco Never Used     ROS see history of present illness  Objective  Physical Exam Vitals:   06/01/19 1415  BP: 120/70  Pulse: 95  Temp: 99.4 F (37.4 C)  SpO2: 95%    BP Readings from Last 3 Encounters:  06/01/19 (!) 168/82  06/01/19 120/70  05/26/19 (!) 143/65   Wt Readings from Last 3 Encounters:  06/01/19 184 lb 3.2 oz (83.6 kg)  06/01/19 184 lb 3.2 oz (83.6 kg)  05/26/19 186 lb 7 oz (84.6 kg)    Physical Exam Constitutional:     General: She is not in acute distress.    Appearance: She is not diaphoretic.  Cardiovascular:     Rate and Rhythm: Normal rate and regular rhythm.     Heart sounds: Normal heart sounds.  Pulmonary:     Effort: Pulmonary effort is normal.     Breath sounds: Normal breath sounds.  Musculoskeletal:     Right lower leg: No edema.     Left lower leg: No edema.  Skin:    General: Skin is warm and dry.  Neurological:     Mental Status: She is alert.      Assessment/Plan: Please see individual problem list.  Hypertension Adequately controlled.  Continue current regimen.  COPD (chronic obstructive pulmonary disease) (HCC) Breathing issues likely related to this.  Advised to consistently use her Symbicort.  She will continue as needed albuterol.  Bilateral lower extremity edema Check lab work to evaluate for potential cause.    Malignant neoplasm of lower-inner quadrant of right breast of female, estrogen receptor positive ([place]) She will keep her appointment with general surgery to determine further evaluation of her dense breast tissue and breast pain.  Hydronephrosis She will complete evaluation through urology.   Orders Placed This Encounter  Procedures  . Flu vaccine HIGH DOSE PF (Fluzone High dose)  . TSH  . B Nat Peptide  .  HgB A1c  . Lipid panel    Meds ordered this encounter  Medications  . budesonide-formoterol (SYMBICORT) 160-4.5 MCG/ACT inhaler    Sig: Inhale 2 puffs into the lungs 2 (two) times daily.    Dispense:  1 Inhaler    Refill:  [place], MD [place]

## 2019-06-01 NOTE — Patient Instructions (Addendum)
You will have your mammogram and ultrasound of right axillary area Friday September 4th at 3:00 pm. They have made a notation of pain of the right breast and will take this into consideration.   You will follow up with Dr Dahlia Byes as scheduled on 06/22/19 at 9:30 am.

## 2019-06-01 NOTE — Patient Instructions (Signed)
Nice to see you. We will get labs today and contact you with the results.  Please keep your appointment with the surgeon later today.  Please start using your symbicort as prescribed.  If you develop worsening breathing issues please be evaluated again.

## 2019-06-02 DIAGNOSIS — N1330 Unspecified hydronephrosis: Secondary | ICD-10-CM | POA: Insufficient documentation

## 2019-06-02 NOTE — Assessment & Plan Note (Signed)
Breathing issues likely related to this.  Advised to consistently use her Symbicort.  She will continue as needed albuterol.

## 2019-06-02 NOTE — Assessment & Plan Note (Signed)
Check lab work to evaluate for potential cause.

## 2019-06-02 NOTE — Assessment & Plan Note (Signed)
Adequately controlled.  Continue current regimen. 

## 2019-06-02 NOTE — Assessment & Plan Note (Signed)
She will complete evaluation through urology. 

## 2019-06-02 NOTE — Assessment & Plan Note (Signed)
She will keep her appointment with general surgery to determine further evaluation of her dense breast tissue and breast pain.

## 2019-06-03 NOTE — Progress Notes (Signed)
Outpatient Surgical Follow Up  06/03/2019  Tammy Choi is an 73 y.o. female.   Chief Complaint  Patient presents with  . Follow-up    right breast lumpectomy    HPI: Tammy Choi is a 73 year old female with a prior history of right lumpectomy with sentinel node biopsy by Dr. Tollie Pizza September 2019. ER/PR positive Her2 Neg. she did receive adjuvant radiation therapy and is on Arimidex. She does report some axillary pain that is mild intermittent and sharp in nature.  There is also some radiation changes within the skin of the right breast.  Past Medical History:  Diagnosis Date  . Breast cancer ([place]) 05/07/2018   T1a, N0; ER/ PR positive, Her 2 neu not overexpressed.   Marland Kitchen COPD (chronic obstructive pulmonary disease) (HCC)    Not on home o2  . Depression   . HTN (hypertension)   . Hyperlipemia     Past Surgical History:  Procedure Laterality Date  . ABDOMINAL HYSTERECTOMY    . BREAST BIOPSY Left 04/30/2013   neg core  . BREAST BIOPSY Right 05/07/2018   right breast stereo x clip INVASIVE MAMMARY CARCINOMA  . BREAST LUMPECTOMY WITH SENTINEL LYMPH NODE BIOPSY Right 06/16/2018   Procedure: BREAST LUMPECTOMY WITH SENTINEL LYMPH NODE BX;  Surgeon: Robert Bellow, MD;  Location: [facility] ORS;  Service: General;  Laterality: Right;  . COLONOSCOPY  2014   Dr Bary Castilla  . Left Leg Surgery    . TONSILLECTOMY      Family History  Problem Relation Age of Onset  . Kidney disease Mother        deceased 54  . Heart disease Father        deceased 10  . Breast cancer Other 10       maternal half-sister; deceased 76  . Colon cancer Neg Hx     Social History:  reports that she quit smoking about 3 years ago. Her smoking use included cigarettes. She has a 35.00 pack-year smoking history. She has never used smokeless tobacco. She reports that she does not drink alcohol or use drugs.  Allergies:  Allergies  Allergen Reactions  . Penicillins Anaphylaxis and Other (See Comments)    Has  patient had a PCN reaction causing immediate rash, facial/tongue/throat swelling, SOB or lightheadedness with hypotension: Yes Has patient had a PCN reaction causing severe rash involving mucus membranes or skin necrosis: No Has patient had a PCN reaction that required hospitalization No Has patient had a PCN reaction occurring within the last 10 years: No If all of the above answers are "NO", then may proceed with Cephalosporin use.    Medications reviewed.    ROS Full ROS performed and is otherwise negative other than what is stated in HPI   BP (!) 168/82   Pulse 91   Temp 97.6 F (36.4 C)   Ht 5' 3.5" (1.613 m)   Wt 184 lb 3.2 oz (83.6 kg)   SpO2 95%   BMI 32.12 kg/m   Physical Exam Vitals signs and nursing note reviewed. Exam conducted with a chaperone present.  Constitutional:      General: She is not in acute distress.    Appearance: Normal appearance.  Eyes:     General:        Right eye: No discharge.        Left eye: No discharge.  Neck:     Musculoskeletal: Normal range of motion and neck supple. No neck rigidity.  Cardiovascular:  Rate and Rhythm: Normal rate and regular rhythm.     Heart sounds: No murmur.  Pulmonary:     Effort: Pulmonary effort is normal. No respiratory distress.     Breath sounds: Normal breath sounds. No stridor.     Comments: BREAST: Evidence of prior Right lumpectomy as well as sentinel node biopsy.  There is also evidence of radiation changes within the breast but no evidence of new masses or concerning lesions.  The contralateral breast is free of any disease.  There is no evidence of lymphadenopathy.  Please note that she is tender to palpation on the lumpectomy site and sentinel lymph node site.  There are no discrete masses or infection Lymphadenopathy:     Cervical: No cervical adenopathy.  Skin:    General: Skin is warm and dry.     Capillary Refill: Capillary refill takes less than 2 seconds.  Neurological:     General: No  focal deficit present.     Mental Status: She is alert and oriented to person, place, and time.  Psychiatric:        Mood and Affect: Mood normal.        Behavior: Behavior normal.        Thought Content: Thought content normal.        Judgment: Judgment normal.       Results for orders placed or performed in visit on 06/01/19 (from the past 48 hour(s))  TSH     Status: None   Collection Time: 06/01/19  2:55 PM  Result Value Ref Range   TSH 1.24 0.35 - 4.50 uIU/mL  B Nat Peptide     Status: None   Collection Time: 06/01/19  2:55 PM  Result Value Ref Range   Pro B Natriuretic peptide (BNP) 30.0 0.0 - 100.0 pg/mL  HgB A1c     Status: None   Collection Time: 06/01/19  2:55 PM  Result Value Ref Range   Hgb A1c MFr Bld 6.3 4.6 - 6.5 %    Comment: Glycemic Control Guidelines for People with Diabetes:Non Diabetic:  <6%Goal of Therapy: <7%Additional Action Suggested:  >8%   Lipid panel     Status: None   Collection Time: 06/01/19  2:55 PM  Result Value Ref Range   Cholesterol 155 0 - 200 mg/dL    Comment: ATP III Classification       Desirable:  < 200 mg/dL               Borderline High:  200 - 239 mg/dL          High:  > = 240 mg/dL   Triglycerides 88.0 0.0 - 149.0 mg/dL    Comment: Normal:  <150 mg/dLBorderline High:  150 - 199 mg/dL   HDL 71.60 >39.00 mg/dL   VLDL 17.6 0.0 - 40.0 mg/dL   LDL Cholesterol 66 0 - 99 mg/dL   Total CHOL/HDL Ratio 2     Comment:                Men          Women1/2 Average Risk     3.4          3.3Average Risk          5.0          4.42X Average Risk          9.6          7.13X Average Risk            15.0          11.0                       NonHDL 83.59     Comment: NOTE:  Non-HDL goal should be 30 mg/dL higher than patient's LDL goal (i.e. LDL goal of < 70 mg/dL, would have non-HDL goal of < 100 mg/dL)   No results found.  Assessment/Plan: Right breast cancer status post lumpectomy and radiation now with changes consistent with radiation dermatitis.   At this point there is no evidence of infection seroma that will require surgery or excision.  We will order an ultrasound of the right axilla I will follow-up on the mammogram as well to make sure there is no other abnormalities.  Greater than 50% of the 25 minutes  visit was spent in counseling/coordination of care   Caroleen Hamman, MD [place] [facility]

## 2019-06-05 ENCOUNTER — Other Ambulatory Visit: Payer: Self-pay | Admitting: Family Medicine

## 2019-06-05 ENCOUNTER — Ambulatory Visit
Admission: RE | Admit: 2019-06-05 | Discharge: 2019-06-05 | Disposition: A | Discharge status: Home / Self Care | Payer: PPO | Source: Ambulatory Visit | Attending: Surgery | Admitting: Surgery

## 2019-06-05 DIAGNOSIS — C50311 Malignant neoplasm of lower-inner quadrant of right female breast: Secondary | ICD-10-CM | POA: Insufficient documentation

## 2019-06-05 DIAGNOSIS — Z170 Estrogen receptor positive status [ER+]: Secondary | ICD-10-CM | POA: Insufficient documentation

## 2019-06-05 DIAGNOSIS — R922 Inconclusive mammogram: Secondary | ICD-10-CM | POA: Diagnosis not present

## 2019-06-05 HISTORY — DX: Personal history of irradiation: Z92.3

## 2019-06-10 ENCOUNTER — Telehealth: Payer: Self-pay | Admitting: *Deleted

## 2019-06-10 NOTE — Telephone Encounter (Signed)
mammo had benign findings . Notified patient as instructed, patient pleased. Discussed follow-up appointments, patient agrees

## 2019-06-15 ENCOUNTER — Other Ambulatory Visit: Payer: PPO

## 2019-06-17 ENCOUNTER — Ambulatory Visit: Payer: PPO

## 2019-06-18 ENCOUNTER — Ambulatory Visit: Payer: PPO | Admitting: Radiation Oncology

## 2019-06-22 ENCOUNTER — Ambulatory Visit: Payer: PPO | Admitting: Surgery

## 2019-06-23 ENCOUNTER — Ambulatory Visit: Admission: RE | Admit: 2019-06-23 | Payer: PPO | Source: Ambulatory Visit

## 2019-06-29 ENCOUNTER — Other Ambulatory Visit: Payer: Self-pay

## 2019-06-29 ENCOUNTER — Encounter: Payer: Self-pay | Admitting: Surgery

## 2019-06-29 ENCOUNTER — Ambulatory Visit (INDEPENDENT_AMBULATORY_CARE_PROVIDER_SITE_OTHER): Payer: PPO | Admitting: Surgery

## 2019-06-29 DIAGNOSIS — C50311 Malignant neoplasm of lower-inner quadrant of right female breast: Secondary | ICD-10-CM | POA: Diagnosis not present

## 2019-06-29 DIAGNOSIS — Z170 Estrogen receptor positive status [ER+]: Secondary | ICD-10-CM

## 2019-06-29 NOTE — Progress Notes (Signed)
Outpatient Surgical Follow Up  06/29/2019  Tammy Choi is an 73 y.o. female.   Chief Complaint  Patient presents with  . Follow-up    mammogram    HPI: history of right lumpectomy with sentinel node biopsy by Dr. Bary Castilla September 2019. ER/PR positive Her2 Neg. she did receive adjuvant radiation therapy and is on Arimidex. She does report some axillary and breast pain that is mild intermittent and sharp in nature.  There is also  radiation changes within the skin of the right breast. No fevers or chill.s Mammo pers. reviewed w benign Dystrophic calcification Right breast.  Past Medical History:  Diagnosis Date  . Breast cancer ([place]) 05/07/2018   T1a, N0; ER/ PR positive, Her 2 neu not overexpressed.   Marland Kitchen COPD (chronic obstructive pulmonary disease) (HCC)    Not on home o2  . Depression   . HTN (hypertension)   . Hyperlipemia   . Personal history of radiation therapy     Past Surgical History:  Procedure Laterality Date  . ABDOMINAL HYSTERECTOMY    . BREAST BIOPSY Left 04/30/2013   neg core  . BREAST BIOPSY Right 05/07/2018   right breast stereo x clip INVASIVE MAMMARY CARCINOMA  . BREAST LUMPECTOMY Right 2019  . BREAST LUMPECTOMY WITH SENTINEL LYMPH NODE BIOPSY Right 06/16/2018   Procedure: BREAST LUMPECTOMY WITH SENTINEL LYMPH NODE BX;  Surgeon: Robert Bellow, MD;  Location: [facility] ORS;  Service: General;  Laterality: Right;  . COLONOSCOPY  2014   Dr Bary Castilla  . Left Leg Surgery    . TONSILLECTOMY      Family History  Problem Relation Age of Onset  . Kidney disease Mother        deceased 81  . Heart disease Father        deceased 74  . Breast cancer Other 1       maternal half-sister; deceased 34  . Colon cancer Neg Hx     Social History:  reports that she quit smoking about 3 years ago. Her smoking use included cigarettes. She has a 35.00 pack-year smoking history. She has never used smokeless tobacco. She reports that she does not drink alcohol or use  drugs.  Allergies:  Allergies  Allergen Reactions  . Penicillins Anaphylaxis and Other (See Comments)    Has patient had a PCN reaction causing immediate rash, facial/tongue/throat swelling, SOB or lightheadedness with hypotension: Yes Has patient had a PCN reaction causing severe rash involving mucus membranes or skin necrosis: No Has patient had a PCN reaction that required hospitalization No Has patient had a PCN reaction occurring within the last 10 years: No If all of the above answers are "NO", then may proceed with Cephalosporin use.    Medications reviewed.    ROS Full ROS performed and is otherwise negative other than what is stated in HPI   Physical Exam  Constitutional:      General: She is not in acute distress.    Appearance: Normal appearance.  Eyes:     General:        Right eye: No discharge.        Left eye: No discharge.  Neck:     Musculoskeletal: Normal range of motion and neck supple. No neck rigidity.  Cardiovascular:     Rate and Rhythm: Normal rate and regular rhythm.     Heart sounds: No murmur.  Pulmonary:     Effort: Pulmonary effort is normal. No respiratory distress.  Breath sounds: Normal breath sounds. No stridor.     Comments: BREAST: Evidence of prior Right lumpectomy as well as sentinel node biopsy.  There is also evidence of radiation changes within the breast but no evidence of new masses or concerning lesions. ( thickenning of skin and sub tissue)   The contralateral breast is free of any disease.  There is no evidence of lymphadenopathy.  Please note that she is tender to palpation on the lumpectomy site and sentinel lymph node site.  There are no discrete masses or infection Lymphadenopathy:     Cervical: No cervical adenopathy.  Skin:    General: Skin is warm and dry.     Capillary Refill: Capillary refill takes less than 2 seconds.  Neurological:     General: No focal deficit present.     Mental Status: She is alert and  oriented to person, place, and time.  Psychiatric:        Mood and Affect: Mood normal.        Behavior: Behavior normal.        Thought Content: Thought content normal.        Judgment: Judgment normal.     Assessment/Plan: 73 yo female w chronic right breast radiation changes. No evidence of suspicious masses. There is calcification that we will obtain mammo in 6 months . No surgical intervention at this time.  Greater than 50% of the 25 minutes  visit was spent in counseling/coordination of care   Caroleen Hamman, MD [place] [facility]

## 2019-06-29 NOTE — Patient Instructions (Addendum)
We will contact you in February  2021 to schedule your Mammogram and breast exam for March  2021.

## 2019-07-06 ENCOUNTER — Ambulatory Visit
Admission: RE | Admit: 2019-07-06 | Discharge: 2019-07-06 | Disposition: A | Discharge status: Home / Self Care | Payer: PPO | Source: Ambulatory Visit | Attending: Urology | Admitting: Urology

## 2019-07-06 ENCOUNTER — Other Ambulatory Visit: Payer: Self-pay

## 2019-07-06 DIAGNOSIS — N1330 Unspecified hydronephrosis: Secondary | ICD-10-CM | POA: Insufficient documentation

## 2019-07-06 MED ORDER — IOHEXOL 300 MG/ML  SOLN
125.0000 mL | Freq: Once | INTRAMUSCULAR | Status: AC | PRN
  Administered 2019-07-06: 125 mL via INTRAVENOUS

## 2019-07-14 ENCOUNTER — Telehealth: Payer: Self-pay

## 2019-07-14 NOTE — Telephone Encounter (Signed)
-----   Message from Billey Co, MD sent at 07/14/2019 12:59 PM EDT ----- Nothing worrisome for any urologic cancer on her CT, the kidney findings are likely just a variation of normal and she has probabaly had it forever, her kidney function is normal. No urology follow up needed  Nickolas Madrid, MD 07/14/2019

## 2019-07-14 NOTE — Telephone Encounter (Signed)
Patient notified

## 2019-07-27 ENCOUNTER — Other Ambulatory Visit: Payer: Self-pay | Admitting: Family Medicine

## 2019-08-11 ENCOUNTER — Other Ambulatory Visit: Payer: Self-pay

## 2019-08-11 ENCOUNTER — Encounter: Payer: Self-pay | Admitting: Family Medicine

## 2019-08-11 ENCOUNTER — Ambulatory Visit (INDEPENDENT_AMBULATORY_CARE_PROVIDER_SITE_OTHER): Payer: PPO | Admitting: Family Medicine

## 2019-08-11 VITALS — BP 138/72 | HR 86 | Temp 97.6°F | Wt 186.6 lb

## 2019-08-11 DIAGNOSIS — E785 Hyperlipidemia, unspecified: Secondary | ICD-10-CM

## 2019-08-11 DIAGNOSIS — I10 Essential (primary) hypertension: Secondary | ICD-10-CM | POA: Diagnosis not present

## 2019-08-11 DIAGNOSIS — R600 Localized edema: Secondary | ICD-10-CM | POA: Diagnosis not present

## 2019-08-11 DIAGNOSIS — J449 Chronic obstructive pulmonary disease, unspecified: Secondary | ICD-10-CM | POA: Diagnosis not present

## 2019-08-11 LAB — BASIC METABOLIC PANEL
BUN: 33 mg/dL — ABNORMAL HIGH (ref 6–23)
CO2: 30 mEq/L (ref 19–32)
Calcium: 9.3 mg/dL (ref 8.4–10.5)
Chloride: 100 mEq/L (ref 96–112)
Creatinine, Ser: 1.01 mg/dL (ref 0.40–1.20)
GFR: 53.57 mL/min — ABNORMAL LOW (ref >60.00)
Glucose, Bld: 102 mg/dL — ABNORMAL HIGH (ref 70–99)
Potassium: 4.6 mEq/L (ref 3.5–5.1)
Sodium: 136 mEq/L (ref 135–145)

## 2019-08-11 MED ORDER — FUROSEMIDE 20 MG PO TABS: 10.0000 mg | ORAL_TABLET | Freq: Every day | ORAL | 1 refills | Status: DC | PRN

## 2019-08-11 NOTE — Progress Notes (Signed)
Subjective:    Patient ID: Tammy Choi, female    DOB: May 25, 1946, 73 y.o.   MRN: [id number]  HPI    Patient presents to clinic due to bilateral swelling in ankles and feet.  Patient is concerned about her heart and her breathing as well.  Patient is a long-term COPD patient.  Patient also has history of hypertension, high cholesterol.  States that swelling worsens throughout the day, but upon waking up every a.m. the swelling is improved.  Denies chest pain or palpitations.  Patient has chronic shortness of breath, somewhat worse over the past few months.  Currently is Symbicort twice a day and albuterol as needed.  She was seeing pulmonologist, but her current pulmonologist leaving the area so would like a new referral.  She has never seen cardiology.   Patient Active Problem List   Diagnosis Date Noted  . Hydronephrosis 06/02/2019  . Allergic rhinitis 02/10/2019  . Cough 09/26/2018  . Malignant neoplasm of lower-inner quadrant of right breast of female, estrogen receptor positive ([place]) 05/14/2018  . Bilateral lower extremity edema 01/09/2017  . Prediabetes 12/16/2016  . Actinic keratoses 10/09/2016  . Myalgia 07/10/2016  . Left wrist pain 07/21/2015  . Anxiety state 07/21/2015  . Obesity (BMI 30-39.9) 07/30/2014  . Dyspnea on exertion 03/30/2014  . COPD (chronic obstructive pulmonary disease) ([place]) 01/21/2013  . Breast mass, left 09/05/2012  . Chronic back pain 01/15/2012  . Depression 01/15/2012  . Hyperlipidemia 01/15/2012  . Hypertension 08/07/2011   Social History   Tobacco Use  . Smoking status: Former Smoker    Packs/day: 1.00    Years: 35.00    Pack years: 35.00    Types: Cigarettes    Quit date: 12/20/2015    Years since quitting: 3.6  . Smokeless tobacco: Never Used  Substance Use Topics  . Alcohol use: No    Alcohol/week: 0.0 standard drinks   Review of Systems  Constitutional: Negative for chills, fatigue and fever.  HENT: Negative for  congestion, ear pain, sinus pain and sore throat.   Eyes: Negative.   Respiratory: Negative for cough, shortness of breath and wheezing.   Cardiovascular: Negative for chest pain, palpitations. +bilat LE edema Gastrointestinal: Negative for abdominal pain, diarrhea, nausea and vomiting.  Genitourinary: Negative for dysuria, frequency and urgency.  Musculoskeletal: Negative for arthralgias and myalgias.  Skin: Negative for color change, pallor and rash.  Neurological: Negative for syncope, light-headedness and headaches.  Psychiatric/Behavioral: The patient is not nervous/anxious.       Objective:   Physical Exam Vitals signs and nursing note reviewed.  Constitutional:      General: She is not in acute distress.    Appearance: She is normal weight. She is not ill-appearing or toxic-appearing.  HENT:     Head: Normocephalic and atraumatic.  Neck:     Musculoskeletal: Normal range of motion and neck supple. No neck rigidity.  Cardiovascular:     Rate and Rhythm: Normal rate and regular rhythm.     Heart sounds: Normal heart sounds.  Pulmonary:     Effort: Pulmonary effort is normal. No respiratory distress.     Comments: Slight diminished breath sounds throughout, long term COPD Musculoskeletal:     Right lower leg: Edema (+1) present.     Left lower leg: Edema (+1) present.  Skin:    General: Skin is warm and dry.     Coloration: Skin is not jaundiced or pale.  Neurological:     Mental Status:  She is alert and oriented to person, place, and time. Mental status is at baseline.  Psychiatric:        Mood and Affect: Mood normal.        Behavior: Behavior normal.    Today's Vitals   08/11/19 1112  BP: 138/72  Pulse: 86  Temp: 97.6 F (36.4 C)  TempSrc: Temporal  SpO2: 96%  Weight: 186 lb 9.6 oz (84.6 kg)   Body mass index is 32.54 kg/m.     Assessment & Plan:     Bilateral lower extremity edema - Plan: furosemide (LASIX) 20 MG tablet, Basic metabolic panel,  Ambulatory referral to Cardiology  Essential hypertension - Plan: furosemide (LASIX) 20 MG tablet, Basic metabolic panel, Ambulatory referral to Cardiology  Chronic obstructive pulmonary disease, unspecified COPD type ([place]) - Plan: Ambulatory referral to Cardiology  Hyperlipidemia, unspecified hyperlipidemia type  Chronic obstructive pulmonary disease, Chronic - Plan: Ambulatory referral to Pulmonology  Explained to patient that edema actually is often a symptom/side effect of long-term blood pressure issues/cholesterol issues.  We will get metabolic panel to be sure electrolytes and kidney functions are looking good.  She will use Lasix as needed when the swelling is worse, but encouraged her to first try using compression stockings and/or elevating legs before going to Lasix as Lasix can be tough on the kidneys.  I will also place referral to cardiology for evaluation.  I will do new referral to pulmonology, discussed with patient options of trying a different inhaler however she would like to hold off until seeing pulmonology.  Patient will keep all regular follow-ups with PCP.  She is aware someone will contact her about cardiology and pulmonology referrals.

## 2019-08-11 NOTE — Patient Instructions (Signed)
Edema  Edema is when you have too much fluid in your body or under your skin. Edema may make your legs, feet, and ankles swell up. Swelling is also common in looser tissues, like around your eyes. This is a common condition. It gets more common as you get older. There are many possible causes of edema. Eating too much salt (sodium) and being on your feet or sitting for a long time can cause edema in your legs, feet, and ankles. Hot weather may make edema worse. Edema is usually painless. Your skin may look swollen or shiny. Follow these instructions at home:  Keep the swollen body part raised (elevated) above the level of your heart when you are sitting or lying down.  Do not sit still or stand for a long time.  Do not wear tight clothes. Do not wear garters on your upper legs.  Exercise your legs. This can help the swelling go down.  Wear elastic bandages or support stockings as told by your doctor.  Eat a low-salt (low-sodium) diet to reduce fluid as told by your doctor.  Depending on the cause of your swelling, you may need to limit how much fluid you drink (fluid restriction).  Take over-the-counter and prescription medicines only as told by your doctor. Contact a doctor if:  Treatment is not working.  You have heart, liver, or kidney disease and have symptoms of edema.  You have sudden and unexplained weight gain. Get help right away if:  You have shortness of breath or chest pain.  You cannot breathe when you lie down.  You have pain, redness, or warmth in the swollen areas.  You have heart, liver, or kidney disease and get edema all of a sudden.  You have a fever and your symptoms get worse all of a sudden. Summary  Edema is when you have too much fluid in your body or under your skin.  Edema may make your legs, feet, and ankles swell up. Swelling is also common in looser tissues, like around your eyes.  Raise (elevate) the swollen body part above the level of your  heart when you are sitting or lying down.  Follow your doctor's instructions about diet and how much fluid you can drink (fluid restriction). This information is not intended to replace advice given to you by your health care provider. Make sure you discuss any questions you have with your health care provider. Document Released: 03/05/2008 Document Revised: 09/20/2017 Document Reviewed: 10/05/2016 Elsevier Patient Education  2020 [employer]  

## 2019-08-13 ENCOUNTER — Other Ambulatory Visit: Payer: Self-pay | Admitting: *Deleted

## 2019-08-21 ENCOUNTER — Other Ambulatory Visit: Payer: Self-pay

## 2019-08-24 ENCOUNTER — Ambulatory Visit
Admission: RE | Admit: 2019-08-24 | Discharge: 2019-08-24 | Disposition: A | Discharge status: Home / Self Care | Payer: PPO | Source: Ambulatory Visit | Attending: Radiation Oncology | Admitting: Radiation Oncology

## 2019-08-24 ENCOUNTER — Other Ambulatory Visit: Payer: PPO

## 2019-08-24 ENCOUNTER — Other Ambulatory Visit: Payer: Self-pay

## 2019-08-24 ENCOUNTER — Encounter: Payer: Self-pay | Admitting: Radiation Oncology

## 2019-08-24 ENCOUNTER — Ambulatory Visit: Payer: PPO | Admitting: Oncology

## 2019-08-24 VITALS — BP 129/61 | HR 81 | Temp 97.8°F | Resp 18 | Wt 187.9 lb

## 2019-08-24 DIAGNOSIS — Z923 Personal history of irradiation: Secondary | ICD-10-CM | POA: Diagnosis not present

## 2019-08-24 DIAGNOSIS — Z170 Estrogen receptor positive status [ER+]: Secondary | ICD-10-CM | POA: Insufficient documentation

## 2019-08-24 DIAGNOSIS — Z79811 Long term (current) use of aromatase inhibitors: Secondary | ICD-10-CM | POA: Insufficient documentation

## 2019-08-24 DIAGNOSIS — C50311 Malignant neoplasm of lower-inner quadrant of right female breast: Secondary | ICD-10-CM | POA: Insufficient documentation

## 2019-08-24 NOTE — Progress Notes (Signed)
Radiation Oncology Follow up Note  Name: Tammy Choi   Date:   08/24/2019 MRN:  [id number] DOB: [birth year]    This 73 y.o. female presents to the clinic today for 50-monthfollow-up status post whole breast radiation to her right breast for stage Ia invasive mammary carcinoma.  ER positive PR HER-2/neu negative  REFERRING PROVIDER: SLeone Haven MD  HPI: Patient is a 73year old female now at 6 months having completed whole breast radiation to her right breast for stage Ia invasive mammary carcinoma ER positive PR HER-2/neu negative.  Seen today in routine follow-up she is doing well.  She specifically denies breast tenderness cough or bone pain.  She is currently on.  Arimidex tolerating it well without side effect.  She had mammograms back in September which I have reviewed were BI-RADS 3 probably benign 662-monthollow-up with diagnostic mammograms for right breast calcifications recommended.  COMPLICATIONS OF TREATMENT: none  FOLLOW UP COMPLIANCE: keeps appointments   PHYSICAL EXAM:  BP 129/61 (BP Location: Left Arm, Patient Position: Sitting)   Pulse 81   Temp 97.8 F (36.6 C) (Tympanic)   Resp 18   Wt 187 lb 14.4 oz (85.2 kg)   BMI 32.76 kg/m  Lungs are clear to A&P cardiac examination essentially unremarkable with regular rate and rhythm. No dominant mass or nodularity is noted in either breast in 2 positions examined. Incision is well-healed. No axillary or supraclavicular adenopathy is appreciated. Cosmetic result is excellent.  Well-developed well-nourished patient in NAD. HEENT reveals PERLA, EOMI, discs not visualized.  Oral cavity is clear. No oral mucosal lesions are identified. Neck is clear without evidence of cervical or supraclavicular adenopathy. Lungs are clear to A&P. Cardiac examination is essentially unremarkable with regular rate and rhythm without murmur rub or thrill. Abdomen is benign with no organomegaly or masses noted. Motor sensory and DTR levels  are equal and symmetric in the upper and lower extremities. Cranial nerves II through XII are grossly intact. Proprioception is intact. No peripheral adenopathy or edema is identified. No motor or sensory levels are noted. Crude visual fields are within normal range.  RADIOLOGY RESULTS: Mammograms reviewed compatible with above-stated findings  PLAN: Present time patient is doing well.  We will follow up on her mammograms in 6 months.  She continues on Arimidex without side effect.  Patient knows to call with any concerns at any time 6-13-monthllow-up was arranged.  I would like to take this opportunity to thank you for allowing me to participate in the care of your patient..  Noreene FilbertD

## 2019-08-29 ENCOUNTER — Other Ambulatory Visit: Payer: Self-pay | Admitting: Oncology

## 2019-09-01 ENCOUNTER — Inpatient Hospital Stay: Payer: PPO

## 2019-09-01 ENCOUNTER — Ambulatory Visit: Payer: PPO | Admitting: Cardiology

## 2019-09-01 ENCOUNTER — Inpatient Hospital Stay: Payer: PPO | Admitting: Oncology

## 2019-09-02 ENCOUNTER — Other Ambulatory Visit: Payer: Self-pay

## 2019-09-02 ENCOUNTER — Other Ambulatory Visit: Payer: Self-pay | Admitting: Family Medicine

## 2019-09-02 DIAGNOSIS — Z20822 Contact with and (suspected) exposure to covid-19: Secondary | ICD-10-CM

## 2019-09-03 ENCOUNTER — Ambulatory Visit: Payer: PPO | Admitting: Cardiology

## 2019-09-04 LAB — NOVEL CORONAVIRUS, NAA: SARS-CoV-2, NAA: DETECTED — AB

## 2019-09-05 ENCOUNTER — Telehealth: Payer: Self-pay | Admitting: Nurse Practitioner

## 2019-09-05 NOTE — Telephone Encounter (Signed)
Attempt to call to discuss  about Covid symptoms and the use of bamlanivimab, a monoclonal antibody infusion for those with mild to moderate Covid symptoms and at a high risk of hospitalization.  Pt is qualified for this infusion at the [facility] infusion center due to COPD, age > 72, and current chemotherapy.  No answer, general HIPAA compliant message left.

## 2019-09-07 ENCOUNTER — Ambulatory Visit: Payer: PPO | Admitting: Family Medicine

## 2019-10-12 ENCOUNTER — Ambulatory Visit: Payer: PPO | Admitting: Cardiology

## 2019-10-19 ENCOUNTER — Other Ambulatory Visit: Payer: Self-pay

## 2019-10-19 ENCOUNTER — Inpatient Hospital Stay: Payer: PPO | Attending: Oncology

## 2019-10-19 DIAGNOSIS — R921 Mammographic calcification found on diagnostic imaging of breast: Secondary | ICD-10-CM

## 2019-10-19 DIAGNOSIS — Z79811 Long term (current) use of aromatase inhibitors: Secondary | ICD-10-CM

## 2019-10-19 DIAGNOSIS — C50311 Malignant neoplasm of lower-inner quadrant of right female breast: Secondary | ICD-10-CM

## 2019-10-19 DIAGNOSIS — Z853 Personal history of malignant neoplasm of breast: Secondary | ICD-10-CM | POA: Diagnosis not present

## 2019-10-19 LAB — CBC WITH DIFFERENTIAL/PLATELET
Abs Immature Granulocytes: 0.02 10*3/uL (ref 0.00–0.07)
Basophils Absolute: 0 10*3/uL (ref 0.0–0.1)
Basophils Relative: 1 %
Eosinophils Absolute: 0.1 10*3/uL (ref 0.0–0.5)
Eosinophils Relative: 1 %
HCT: 37.8 % (ref 36.0–46.0)
Hemoglobin: 11.5 g/dL — ABNORMAL LOW (ref 12.0–15.0)
Immature Granulocytes: 0 %
Lymphocytes Relative: 28 %
Lymphs Abs: 2.3 10*3/uL (ref 0.7–4.0)
MCH: 27.6 pg (ref 26.0–34.0)
MCHC: 30.4 g/dL (ref 30.0–36.0)
MCV: 90.9 fL (ref 80.0–100.0)
Monocytes Absolute: 0.7 10*3/uL (ref 0.1–1.0)
Monocytes Relative: 9 %
Neutro Abs: 5.1 10*3/uL (ref 1.7–7.7)
Neutrophils Relative %: 61 %
Platelets: 222 10*3/uL (ref 150–400)
RBC: 4.16 MIL/uL (ref 3.87–5.11)
RDW: 14.3 % (ref 11.5–15.5)
WBC: 8.2 10*3/uL (ref 4.0–10.5)
nRBC: 0 % (ref 0.0–0.2)

## 2019-10-19 LAB — COMPREHENSIVE METABOLIC PANEL
ALT: 12 U/L (ref 0–44)
AST: 14 U/L — ABNORMAL LOW (ref 15–41)
Albumin: 3.7 g/dL (ref 3.5–5.0)
Alkaline Phosphatase: 69 U/L (ref 38–126)
Anion gap: 10 (ref 5–15)
BUN: 34 mg/dL — ABNORMAL HIGH (ref 8–23)
CO2: 26 mmol/L (ref 22–32)
Calcium: 9 mg/dL (ref 8.9–10.3)
Chloride: 102 mmol/L (ref 98–111)
Creatinine, Ser: 1.09 mg/dL — ABNORMAL HIGH (ref 0.44–1.00)
GFR calc Af Amer: 58 mL/min — ABNORMAL LOW (ref >60)
GFR calc non Af Amer: 50 mL/min — ABNORMAL LOW (ref >60)
Glucose, Bld: 99 mg/dL (ref 70–99)
Potassium: 4.4 mmol/L (ref 3.5–5.1)
Sodium: 138 mmol/L (ref 135–145)
Total Bilirubin: 0.4 mg/dL (ref 0.3–1.2)
Total Protein: 7.3 g/dL (ref 6.5–8.1)

## 2019-10-20 ENCOUNTER — Inpatient Hospital Stay (HOSPITAL_BASED_OUTPATIENT_CLINIC_OR_DEPARTMENT_OTHER): Payer: PPO | Admitting: Oncology

## 2019-10-20 ENCOUNTER — Encounter: Payer: Self-pay | Admitting: Oncology

## 2019-10-20 ENCOUNTER — Inpatient Hospital Stay: Payer: PPO

## 2019-10-20 DIAGNOSIS — D649 Anemia, unspecified: Secondary | ICD-10-CM

## 2019-10-20 DIAGNOSIS — R300 Dysuria: Secondary | ICD-10-CM

## 2019-10-20 DIAGNOSIS — Z170 Estrogen receptor positive status [ER+]: Secondary | ICD-10-CM

## 2019-10-20 DIAGNOSIS — C50311 Malignant neoplasm of lower-inner quadrant of right female breast: Secondary | ICD-10-CM

## 2019-10-20 DIAGNOSIS — Z853 Personal history of malignant neoplasm of breast: Secondary | ICD-10-CM | POA: Diagnosis not present

## 2019-10-20 DIAGNOSIS — Z79811 Long term (current) use of aromatase inhibitors: Secondary | ICD-10-CM

## 2019-10-20 NOTE — Progress Notes (Signed)
HEMATOLOGY-ONCOLOGY TeleHEALTH VISIT PROGRESS NOTE  I connected with Tammy Choi on 10/20/19 at  2:45 PM EST by video enabled telemedicine visit and verified that I am speaking with the correct person using two identifiers. I discussed the limitations, risks, security and privacy concerns of performing an evaluation and management service by telemedicine and the availability of in-person appointments. I also discussed with the patient that there may be a patient responsible charge related to this service. The patient expressed understanding and agreed to proceed.   Other persons participating in the visit and their role in the encounter:  None  Patient's location: Home  Provider's location: office Chief Complaint: Breast cancer,   INTERVAL HISTORY Tammy Choi is a 73 y.o. female who has above history reviewed by me today presents for follow up visit for management of breast cancer Problems and complaints are listed below:  Patient reports that she starts to have urinary tract infection symptoms including burning sensation and cloudy urine for a few days.  Denies any fever or chills. Breast cancer, she uses Arimidex, tolerates well. 06/05/2019 bilateral diagnostic mammogram showed 2 separate groups of likely benign developing dystrophic calcifications associated with fat necrosis/oil cyst on the right breast and in the right breast at the middle depth.  No mammogram evidence of malignancy involving the left breast. I attempted to connect the patient for visual enabled telehealth.  Due to the technical difficulties with video,  Patient was transitioned to audio only visit.  Review of Systems  Constitutional: Negative for appetite change, chills, fatigue and fever.  HENT:   Negative for hearing loss and voice change.   Eyes: Negative for eye problems.  Respiratory: Negative for chest tightness and cough.   Cardiovascular: Negative for chest pain.  Gastrointestinal: Negative for  abdominal distention, abdominal pain and blood in stool.  Endocrine: Negative for hot flashes.  Genitourinary: Positive for dysuria. Negative for difficulty urinating and frequency.   Musculoskeletal: Negative for arthralgias.  Skin: Negative for itching and rash.  Neurological: Negative for extremity weakness.  Hematological: Negative for adenopathy.  Psychiatric/Behavioral: Negative for confusion.    Past Medical History:  Diagnosis Date  . Breast cancer (HCC) 05/07/2018   T1a, N0; ER/ PR positive, Her 2 neu not overexpressed.   . COPD (chronic obstructive pulmonary disease) (HCC)    Not on home o2  . Depression   . HTN (hypertension)   . Hyperlipemia   . Personal history of radiation therapy    Past Surgical History:  Procedure Laterality Date  . ABDOMINAL HYSTERECTOMY    . BREAST BIOPSY Left 04/30/2013   neg core  . BREAST BIOPSY Right 05/07/2018   right breast stereo x clip INVASIVE MAMMARY CARCINOMA  . BREAST LUMPECTOMY Right 2019  . BREAST LUMPECTOMY WITH SENTINEL LYMPH NODE BIOPSY Right 06/16/2018   Procedure: BREAST LUMPECTOMY WITH SENTINEL LYMPH NODE BX;  Surgeon: Byrnett, Jeffrey W, MD;  Location: [facility] ORS;  Service: General;  Laterality: Right;  . COLONOSCOPY  2014   Dr Byrnett  . Left Leg Surgery    . TONSILLECTOMY      Family History  Problem Relation Age of Onset  . Kidney disease Mother        deceased 85  . Heart disease Father        deceased 86  . Breast cancer Other 40       maternal half-sister; deceased 68  . Colon cancer Neg Hx     Social History   Socioeconomic History  .   Marital status: Divorced    Spouse name: Not on file  . Number of children: Not on file  . Years of education: Not on file  . Highest education level: Not on file  Occupational History  . Occupation: Retired [employer]    Employer: retired  Tobacco Use  . Smoking status: Former Smoker    Packs/day: 1.00    Years: 35.00    Pack years: 35.00    Types:  Cigarettes    Quit date: 12/20/2015    Years since quitting: 3.8  . Smokeless tobacco: Never Used  Substance and Sexual Activity  . Alcohol use: No    Alcohol/week: 0.0 standard drinks  . Drug use: No  . Sexual activity: Not on file  Other Topics Concern  . Not on file  Social History Narrative   Lives with daughter at home. Independent at baseline.   Social Determinants of Health   Financial Resource Strain: Low Risk   . Difficulty of Paying Living Expenses: Not hard at all  Food Insecurity: No Food Insecurity  . Worried About Running Out of Food in the Last Year: Never true  . Ran Out of Food in the Last Year: Never true  Transportation Needs: No Transportation Needs  . Lack of Transportation (Medical): No  . Lack of Transportation (Non-Medical): No  Physical Activity:   . Days of Exercise per Week: Not on file  . Minutes of Exercise per Session: Not on file  Stress: No Stress Concern Present  . Feeling of Stress : Not at all  Social Connections:   . Frequency of Communication with Friends and Family: Not on file  . Frequency of Social Gatherings with Friends and Family: Not on file  . Attends Religious Services: Not on file  . Active Member of Clubs or Organizations: Not on file  . Attends Club or Organization Meetings: Not on file  . Marital Status: Not on file  Intimate Partner Violence:   . Fear of Current or Ex-Partner: Not on file  . Emotionally Abused: Not on file  . Physically Abused: Not on file  . Sexually Abused: Not on file    Current Outpatient Medications on File Prior to Visit  Medication Sig Dispense Refill  . acetaminophen (TYLENOL) 500 MG tablet Take 500-1,000 mg by mouth 2 (two) times daily as needed for moderate pain or headache.    . anastrozole (ARIMIDEX) 1 MG tablet Take 1 tablet by mouth once daily 90 tablet 0  . budesonide-formoterol (SYMBICORT) 160-4.5 MCG/ACT inhaler Inhale 2 puffs into the lungs 2 (two) times daily. 1 Inhaler 5  . FLUoxetine  (PROZAC) 40 MG capsule Take 1 capsule by mouth at bedtime 90 capsule 0  . fluticasone (FLONASE) 50 MCG/ACT nasal spray Place 2 sprays into both nostrils daily. 16 g 6  . furosemide (LASIX) 20 MG tablet Take 0.5 tablets (10 mg total) by mouth daily as needed for edema. 30 tablet 1  . hydrochlorothiazide (HYDRODIURIL) 12.5 MG tablet Take 1 tablet by mouth once daily 90 tablet 0  . lisinopril (ZESTRIL) 40 MG tablet Take 1 tablet by mouth once daily 90 tablet 1  . PROAIR HFA 108 (90 Base) MCG/ACT inhaler INHALE 2 PUFFS BY MOUTH EVERY 6 HOURS AS NEEDED FOR WHEEZING OR SHORTNESS OF BREATH 9 g 0  . rosuvastatin (CRESTOR) 5 MG tablet Take 1 tablet by mouth once daily 90 tablet 0  . cholecalciferol (VITAMIN D) 1000 UNITS tablet Take 2,000 Units by mouth every other day.     .   Multiple Vitamins-Minerals (MULTIVITAMIN WITH MINERALS) tablet Take 1 tablet by mouth every other day.     . trimethoprim-polymyxin b (POLYTRIM) ophthalmic solution Place 1 drop into the left eye every 4 (four) hours. (Patient not taking: Reported on 10/20/2019) 10 mL 0   No current facility-administered medications on file prior to visit.    Allergies  Allergen Reactions  . Penicillins Anaphylaxis and Other (See Comments)    Has patient had a PCN reaction causing immediate rash, facial/tongue/throat swelling, SOB or lightheadedness with hypotension: Yes Has patient had a PCN reaction causing severe rash involving mucus membranes or skin necrosis: No Has patient had a PCN reaction that required hospitalization No Has patient had a PCN reaction occurring within the last 10 years: No If all of the above answers are "NO", then may proceed with Cephalosporin use.       Observations/Objective: Today's Vitals   10/20/19 1323  PainSc: 0-No pain   There is no height or weight on file to calculate BMI.  Physical Exam  Constitutional: No distress.    CBC    Component Value Date/Time   WBC 8.2 10/19/2019 1542   RBC 4.16  10/19/2019 1542   HGB 11.5 (L) 10/19/2019 1542   HCT 37.8 10/19/2019 1542   PLT 222 10/19/2019 1542   MCV 90.9 10/19/2019 1542   MCH 27.6 10/19/2019 1542   MCHC 30.4 10/19/2019 1542   RDW 14.3 10/19/2019 1542   LYMPHSABS 2.3 10/19/2019 1542   MONOABS 0.7 10/19/2019 1542   EOSABS 0.1 10/19/2019 1542   BASOSABS 0.0 10/19/2019 1542    CMP     Component Value Date/Time   NA 138 10/19/2019 1542   K 4.4 10/19/2019 1542   CL 102 10/19/2019 1542   CO2 26 10/19/2019 1542   GLUCOSE 99 10/19/2019 1542   BUN 34 (H) 10/19/2019 1542   CREATININE 1.09 (H) 10/19/2019 1542   CREATININE 0.91 08/01/2018 1458   CALCIUM 9.0 10/19/2019 1542   PROT 7.3 10/19/2019 1542   ALBUMIN 3.7 10/19/2019 1542   AST 14 (L) 10/19/2019 1542   ALT 12 10/19/2019 1542   ALKPHOS 69 10/19/2019 1542   BILITOT 0.4 10/19/2019 1542   GFRNONAA 50 (L) 10/19/2019 1542   GFRAA 58 (L) 10/19/2019 1542   Assessment and Plan: 1. Malignant neoplasm of lower-inner quadrant of right breast of female, estrogen receptor positive (HCC)   2. Aromatase inhibitor use   3. Anemia, unspecified type   4. Dysuria     Stage IA right breast cancer, ER positive, PR negative, HER-2 negative. Mammogram was reviewed and discussed with patient.  Patient will need to have repeat diagnostic right breast mammogra in March 2021.  Patient will get mammogram through Dr. Patel's office. Continue Arimidex daily.  #Anemia, hemoglobin 11.5.  Continue monitor.  Possible anemia secondary to CKD.   Check multiple myeloma and light chain ratio Recommend obtain bone density. Dysuria, I recommend patient to call primary care provider for further evaluation of possible UTI.   Follow Up Instructions: 6 months.   I discussed the assessment and treatment plan with the patient. The patient was provided an opportunity to ask questions and all were answered. The patient agreed with the plan and demonstrated an understanding of the instructions.  The patient  was advised to call back or seek an in-person evaluation if the symptoms worsen or if the condition fails to improve as anticipated.     Zhou Yu, MD 10/20/2019 7:22 PM  

## 2019-10-20 NOTE — Progress Notes (Signed)
Patient verified using two identifiers for virtual visit via telephone today.   Patient is having symptoms of UTI of back pain, cloudy urine, and occasional dysuria for past couple weeks.

## 2019-10-21 ENCOUNTER — Telehealth: Payer: Self-pay | Admitting: Family Medicine

## 2019-10-21 DIAGNOSIS — R300 Dysuria: Secondary | ICD-10-CM

## 2019-10-21 NOTE — Telephone Encounter (Signed)
-----   Message from Earlie Server, MD sent at 10/20/2019  8:05 PM EST ----- Dr.Caisley Baxendale,  I saw patient today virtually for breast cancer follow up. She mentioned dysuria and cloudy urine. I advised patient to contact your office and get urine checked. She has an upcoming appt with you soon   Talbert Cage

## 2019-10-21 NOTE — Telephone Encounter (Signed)
I called the patient and spoke with her and she stated she could bring in a urine sample tomorrow, so you can place the order.  Kimberley Dastrup,cma

## 2019-10-21 NOTE — Telephone Encounter (Signed)
Please let the patient know that I heard from her oncologist.  They mentioned that she was having some urinary symptoms.  Please see if she can bring [place] a urine sample prior to her visit on Friday.  She did test positive for Covid on December 2.  Please make sure that she is able to come to the office prior to having her come in to provide a urine sample given that positive test thanks.

## 2019-10-22 ENCOUNTER — Other Ambulatory Visit (INDEPENDENT_AMBULATORY_CARE_PROVIDER_SITE_OTHER): Payer: PPO

## 2019-10-22 ENCOUNTER — Other Ambulatory Visit: Payer: Self-pay

## 2019-10-22 ENCOUNTER — Other Ambulatory Visit: Payer: Self-pay | Admitting: Family Medicine

## 2019-10-22 DIAGNOSIS — R300 Dysuria: Secondary | ICD-10-CM | POA: Diagnosis not present

## 2019-10-22 LAB — POCT URINALYSIS DIPSTICK
Bilirubin, UA: NEGATIVE
Glucose, UA: NEGATIVE
Ketones, UA: NEGATIVE
Nitrite, UA: NEGATIVE
Protein, UA: POSITIVE — AB
Spec Grav, UA: 1.02 (ref 1.010–1.025)
Urobilinogen, UA: 0.2 E.U./dL
pH, UA: 5.5 (ref 5.0–8.0)

## 2019-10-22 NOTE — Addendum Note (Signed)
Addended by: Leone Haven on: 10/22/2019 08:50 AM   Modules accepted: Orders

## 2019-10-22 NOTE — Telephone Encounter (Signed)
Ordered

## 2019-10-22 NOTE — Addendum Note (Signed)
Addended by: Leeanne Rio on: 10/22/2019 03:41 PM   Modules accepted: Orders

## 2019-10-22 NOTE — Telephone Encounter (Signed)
Second request: Please place future order for urine

## 2019-10-23 ENCOUNTER — Ambulatory Visit (INDEPENDENT_AMBULATORY_CARE_PROVIDER_SITE_OTHER): Payer: PPO | Admitting: Family Medicine

## 2019-10-23 ENCOUNTER — Encounter: Payer: Self-pay | Admitting: Family Medicine

## 2019-10-23 DIAGNOSIS — N3001 Acute cystitis with hematuria: Secondary | ICD-10-CM | POA: Diagnosis not present

## 2019-10-23 DIAGNOSIS — I10 Essential (primary) hypertension: Secondary | ICD-10-CM | POA: Diagnosis not present

## 2019-10-23 DIAGNOSIS — J449 Chronic obstructive pulmonary disease, unspecified: Secondary | ICD-10-CM

## 2019-10-23 DIAGNOSIS — N390 Urinary tract infection, site not specified: Secondary | ICD-10-CM | POA: Insufficient documentation

## 2019-10-23 LAB — URINALYSIS, MICROSCOPIC ONLY

## 2019-10-23 MED ORDER — NITROFURANTOIN MONOHYD MACRO 100 MG PO CAPS: 100.0000 mg | ORAL_CAPSULE | Freq: Two times a day (BID) | ORAL | 0 refills | Status: DC

## 2019-10-23 NOTE — Assessment & Plan Note (Signed)
Adequately controlled at radiation oncology.  She will continue her current regimen.

## 2019-10-23 NOTE — Assessment & Plan Note (Signed)
Symptoms concerning for UTI.  Urinalysis and UA also concerning.  We will see what her culture shows.  We will go ahead and empirically treat with Macrobid.  We will contact her when the culture results return.  Discussed the potential for seeing urology given the blood in her urine depending on what her culture shows.

## 2019-10-23 NOTE — Assessment & Plan Note (Signed)
Well-controlled.  Continue current regimen. 

## 2019-10-23 NOTE — Progress Notes (Signed)
Virtual Visit via video Note  This visit type was conducted due to national recommendations for restrictions regarding the COVID-19 pandemic (e.g. social distancing).  This format is felt to be most appropriate for this patient at this time.  All issues noted in this document were discussed and addressed.  No physical exam was performed (except for noted visual exam findings with Video Visits).   I connected with Tammy Choi today at  2:45 PM EST by a video enabled telemedicine application and verified that I am speaking with the correct person using two identifiers. Location patient: home Location provider: work  Persons participating in the virtual visit: patient, provider  I discussed the limitations, risks, security and privacy concerns of performing an evaluation and management service by telephone and the availability of in person appointments. I also discussed with the patient that there may be a patient responsible charge related to this service. The patient expressed understanding and agreed to proceed.   Reason for visit: follow-up  HPI: HYPERTENSION  Disease Monitoring  Home BP Monitoring not checking Chest pain- no    Dyspnea- stable Medications  Compliance-  Taking HCTZ, lisinopril. Lightheadedness-  no  Edema- chronic and stable No orthopnea or PND.   COPD: Medication compliance- symbicort  Rescue inhaler use- rare Dyspnea- stable  Wheezing- no  Cough- no  Productive- no  UTI: Dysuria- yes, started 1 week ago Frequency- no  Urgency- yes, starts leaking though this is chronic  Hematuria- pink tinge to the paper, 4-5 days ago  Fever- no Abd pain- around sides, resolved  Vaginal d/c- no Does not feel like bladder empties and has to go right back to the bathroom.    ROS: See pertinent positives and negatives per HPI.  Past Medical History:  Diagnosis Date  . Breast cancer ([place]) 05/07/2018   T1a, N0; ER/ PR positive, Her 2 neu not overexpressed.   Marland Kitchen COPD (chronic  obstructive pulmonary disease) (HCC)    Not on home o2  . Depression   . HTN (hypertension)   . Hyperlipemia   . Personal history of radiation therapy     Past Surgical History:  Procedure Laterality Date  . ABDOMINAL HYSTERECTOMY    . BREAST BIOPSY Left 04/30/2013   neg core  . BREAST BIOPSY Right 05/07/2018   right breast stereo x clip INVASIVE MAMMARY CARCINOMA  . BREAST LUMPECTOMY Right 2019  . BREAST LUMPECTOMY WITH SENTINEL LYMPH NODE BIOPSY Right 06/16/2018   Procedure: BREAST LUMPECTOMY WITH SENTINEL LYMPH NODE BX;  Surgeon: Robert Bellow, MD;  Location: [facility] ORS;  Service: General;  Laterality: Right;  . COLONOSCOPY  2014   Dr Bary Castilla  . Left Leg Surgery    . TONSILLECTOMY      Family History  Problem Relation Age of Onset  . Kidney disease Mother        deceased 92  . Heart disease Father        deceased 37  . Breast cancer Other 18       maternal half-sister; deceased 51  . Colon cancer Neg Hx     SOCIAL HX: Former smoker   Current Outpatient Medications:  .  acetaminophen (TYLENOL) 500 MG tablet, Take 500-1,000 mg by mouth 2 (two) times daily as needed for moderate pain or headache., Disp: , Rfl:  .  anastrozole (ARIMIDEX) 1 MG tablet, Take 1 tablet by mouth once daily, Disp: 90 tablet, Rfl: 0 .  budesonide-formoterol (SYMBICORT) 160-4.5 MCG/ACT inhaler, Inhale 2 puffs into  the lungs 2 (two) times daily., Disp: 1 Inhaler, Rfl: 5 .  cholecalciferol (VITAMIN D) 1000 UNITS tablet, Take 2,000 Units by mouth every other day. , Disp: , Rfl:  .  FLUoxetine (PROZAC) 40 MG capsule, Take 1 capsule by mouth at bedtime, Disp: 90 capsule, Rfl: 0 .  fluticasone (FLONASE) 50 MCG/ACT nasal spray, Place 2 sprays into both nostrils daily., Disp: 16 g, Rfl: 6 .  furosemide (LASIX) 20 MG tablet, Take 0.5 tablets (10 mg total) by mouth daily as needed for edema., Disp: 30 tablet, Rfl: 1 .  hydrochlorothiazide (HYDRODIURIL) 12.5 MG tablet, Take 1 tablet by mouth once daily,  Disp: 90 tablet, Rfl: 0 .  lisinopril (ZESTRIL) 40 MG tablet, Take 1 tablet by mouth once daily, Disp: 90 tablet, Rfl: 1 .  Multiple Vitamins-Minerals (MULTIVITAMIN WITH MINERALS) tablet, Take 1 tablet by mouth every other day. , Disp: , Rfl:  .  PROAIR HFA 108 (90 Base) MCG/ACT inhaler, INHALE 2 PUFFS BY MOUTH EVERY 6 HOURS AS NEEDED FOR WHEEZING OR SHORTNESS OF BREATH, Disp: 9 g, Rfl: 0 .  rosuvastatin (CRESTOR) 5 MG tablet, Take 1 tablet by mouth once daily, Disp: 90 tablet, Rfl: 0 .  trimethoprim-polymyxin b (POLYTRIM) ophthalmic solution, Place 1 drop into the left eye every 4 (four) hours., Disp: 10 mL, Rfl: 0 .  nitrofurantoin, macrocrystal-monohydrate, (MACROBID) 100 MG capsule, Take 1 capsule (100 mg total) by mouth 2 (two) times daily., Disp: 10 capsule, Rfl: 0  EXAM:  VITALS per patient if applicable:  GENERAL: alert, oriented, appears well and in no acute distress  HEENT: atraumatic, conjunttiva clear, no obvious abnormalities on inspection of external nose and ears  NECK: normal movements of the head and neck  LUNGS: on inspection no signs of respiratory distress, breathing rate appears normal, no obvious gross SOB, gasping or wheezing  CV: no obvious cyanosis  MS: moves all visible extremities without noticeable abnormality  PSYCH/NEURO: pleasant and cooperative, no obvious depression or anxiety, speech and thought processing grossly intact  ASSESSMENT AND PLAN:  Discussed the following assessment and plan:  Hypertension Adequately controlled at radiation oncology.  She will continue her current regimen.  COPD (chronic obstructive pulmonary disease) ([place]) Well-controlled.  Continue current regimen.  UTI (urinary tract infection) Symptoms concerning for UTI.  Urinalysis and UA also concerning.  We will see what her culture shows.  We will go ahead and empirically treat with Macrobid.  We will contact her when the culture results return.  Discussed the potential for  seeing urology given the blood in her urine depending on what her culture shows.   Discussed COVID-19 vaccine.  Advised that she should get this.  She tested positive for COVID-19 on 09/02/2019.  Discussed that within the 90 days after that it is reasonable to delay the vaccine though I would advise that she proceed with it when she is ready to and certainly by the end of the 90 days.  Discussed the risk of reinfection after that 90 days.  No orders of the defined types were placed in this encounter.   Meds ordered this encounter  Medications  . nitrofurantoin, macrocrystal-monohydrate, (MACROBID) 100 MG capsule    Sig: Take 1 capsule (100 mg total) by mouth 2 (two) times daily.    Dispense:  10 capsule    Refill:  0     I discussed the assessment and treatment plan with the patient. The patient was provided an opportunity to ask questions and all were answered. The  patient agreed with the plan and demonstrated an understanding of the instructions.   The patient was advised to call back or seek an in-person evaluation if the symptoms worsen or if the condition fails to improve as anticipated.    [name], MD

## 2019-10-24 LAB — URINE CULTURE
MICRO NUMBER:: 10066461
SPECIMEN QUALITY:: ADEQUATE

## 2019-10-27 ENCOUNTER — Telehealth: Payer: Self-pay | Admitting: Family Medicine

## 2019-10-27 NOTE — Telephone Encounter (Signed)
Pt was returning call regarding lab results. She said she will be by the phone waiting and she sorry she missed your call.

## 2019-11-04 ENCOUNTER — Other Ambulatory Visit: Payer: Self-pay | Admitting: Family Medicine

## 2019-11-09 ENCOUNTER — Telehealth: Payer: Self-pay | Admitting: Family Medicine

## 2019-11-09 NOTE — Telephone Encounter (Signed)
These were sent in today  

## 2019-11-09 NOTE — Telephone Encounter (Signed)
Pt states she called the pharmacy on Thursday for Prozac, Crestor, and hydrochlorothiazide prescriptions to be filled and pharmacy told her they never heard from [place].

## 2019-11-19 ENCOUNTER — Telehealth: Payer: Self-pay | Admitting: *Deleted

## 2019-11-19 DIAGNOSIS — R3129 Other microscopic hematuria: Secondary | ICD-10-CM

## 2019-11-19 NOTE — Telephone Encounter (Signed)
Please place future orders for lab appt.  

## 2019-11-19 NOTE — Telephone Encounter (Signed)
Ordered

## 2019-11-23 ENCOUNTER — Other Ambulatory Visit: Payer: Self-pay

## 2019-11-23 ENCOUNTER — Other Ambulatory Visit (INDEPENDENT_AMBULATORY_CARE_PROVIDER_SITE_OTHER): Payer: PPO

## 2019-11-23 DIAGNOSIS — R3129 Other microscopic hematuria: Secondary | ICD-10-CM | POA: Diagnosis not present

## 2019-11-23 LAB — POCT URINALYSIS DIPSTICK
Bilirubin, UA: NEGATIVE
Blood, UA: NEGATIVE
Glucose, UA: NEGATIVE
Ketones, UA: NEGATIVE
Leukocytes, UA: NEGATIVE
Nitrite, UA: NEGATIVE
Protein, UA: NEGATIVE
Spec Grav, UA: 1.02 (ref 1.010–1.025)
Urobilinogen, UA: 0.2 E.U./dL
pH, UA: 6 (ref 5.0–8.0)

## 2019-12-04 ENCOUNTER — Other Ambulatory Visit: Payer: PPO

## 2019-12-04 ENCOUNTER — Other Ambulatory Visit: Payer: Self-pay | Admitting: Oncology

## 2019-12-09 ENCOUNTER — Ambulatory Visit: Payer: PPO | Admitting: Surgery

## 2019-12-16 ENCOUNTER — Other Ambulatory Visit: Payer: Self-pay | Admitting: Family Medicine

## 2020-01-04 ENCOUNTER — Ambulatory Visit
Admission: RE | Admit: 2020-01-04 | Discharge: 2020-01-04 | Disposition: A | Discharge status: Home / Self Care | Payer: PPO | Source: Ambulatory Visit | Attending: Surgery | Admitting: Surgery

## 2020-01-04 ENCOUNTER — Telehealth: Payer: Self-pay

## 2020-01-04 DIAGNOSIS — R921 Mammographic calcification found on diagnostic imaging of breast: Secondary | ICD-10-CM | POA: Insufficient documentation

## 2020-01-04 NOTE — Telephone Encounter (Signed)
-----   Message from Jules Husbands, MD sent at 01/04/2020  2:07 PM EDT ----- Please let the pt kwow that mammo showed calcification but nothing that nees to be biopsied or removed. Need repeat mammo in sept. ----- Message ----- From: Interface, Rad Results In Sent: 01/04/2020   1:21 PM EDT To: Jules Husbands, MD

## 2020-01-04 NOTE — Telephone Encounter (Signed)
Per Dr.Pabon to notify patient of recent mammogram results "Please let the pt kwow that mammo showed calcification but nothing that nees to be biopsied or removed. Need repeat mammo in sept." Patient states she has an appointment scheduled with Dr.Pabon on 01/18/20 at 10am. Patient asked if she needed to keep that appointment. Advised patient to keep appointment to discuss the next step recommended steps and discuss the mammogram results. Patient verbalized understanding and has no further questions.

## 2020-01-15 ENCOUNTER — Telehealth: Payer: Self-pay

## 2020-01-15 NOTE — Telephone Encounter (Signed)
l mom to r/s appt from 10/2019

## 2020-01-18 ENCOUNTER — Ambulatory Visit: Payer: PPO | Admitting: Surgery

## 2020-01-18 ENCOUNTER — Other Ambulatory Visit: Payer: Self-pay

## 2020-01-18 ENCOUNTER — Telehealth: Payer: Self-pay

## 2020-01-18 ENCOUNTER — Encounter: Payer: Self-pay | Admitting: Surgery

## 2020-01-18 VITALS — BP 101/68 | HR 99 | Temp 97.2°F | Ht 63.0 in | Wt 176.8 lb

## 2020-01-18 DIAGNOSIS — M25559 Pain in unspecified hip: Secondary | ICD-10-CM

## 2020-01-18 DIAGNOSIS — Z170 Estrogen receptor positive status [ER+]: Secondary | ICD-10-CM

## 2020-01-18 DIAGNOSIS — C50311 Malignant neoplasm of lower-inner quadrant of right female breast: Secondary | ICD-10-CM | POA: Diagnosis not present

## 2020-01-18 NOTE — Telephone Encounter (Signed)
Referral was sent over to Dr.Poggi at [facility] at [phone] along with required documents.

## 2020-01-18 NOTE — Progress Notes (Signed)
Outpatient Surgical Follow Up  01/18/2020  Tammy Choi is an 74 y.o. female.   Chief Complaint  Patient presents with  . Follow-up    6 mo f/u rec [facility] Diag Mammo [facility] 12/04/19, Calcifications r/s mammo to 01/04/20    HPI: Mrs Sien is a 74 year old female with a history of right breast cancer status post lumpectomy and radiation therapy in 2019.  She is following up yearly for her mammogram and physical exam.  She denies any breast lumps.  No breast discharge.  No fevers no chills no weight loss.  She does have significant right hip pain and wishes to see an orthopedic surgeon for this. She did have a mammogram that I have personally reviewed showing some calcifications on the right breast that are dystrophic without concern for malignancy.  Past Medical History:  Diagnosis Date  . Breast cancer ([place]) 05/07/2018   T1a, N0; ER/ PR positive, Her 2 neu not overexpressed.   Marland Kitchen COPD (chronic obstructive pulmonary disease) (HCC)    Not on home o2  . Depression   . HTN (hypertension)   . Hyperlipemia   . Personal history of radiation therapy     Past Surgical History:  Procedure Laterality Date  . ABDOMINAL HYSTERECTOMY    . BREAST BIOPSY Left 04/30/2013   neg core  . BREAST BIOPSY Right 05/07/2018   right breast stereo x clip INVASIVE MAMMARY CARCINOMA  . BREAST LUMPECTOMY Right 2019  . BREAST LUMPECTOMY WITH SENTINEL LYMPH NODE BIOPSY Right 06/16/2018   Procedure: BREAST LUMPECTOMY WITH SENTINEL LYMPH NODE BX;  Surgeon: Robert Bellow, MD;  Location: [facility] ORS;  Service: General;  Laterality: Right;  . COLONOSCOPY  2014   Dr Bary Castilla  . Left Leg Surgery    . TONSILLECTOMY      Family History  Problem Relation Age of Onset  . Kidney disease Mother        deceased 18  . Heart disease Father        deceased 15  . Breast cancer Other 39       maternal half-sister; deceased 74  . Colon cancer Neg Hx     Social History:  reports that she quit smoking about 4 years  ago. Her smoking use included cigarettes. She has a 35.00 pack-year smoking history. She has never used smokeless tobacco. She reports that she does not drink alcohol or use drugs.  Allergies:  Allergies  Allergen Reactions  . Penicillins Anaphylaxis and Other (See Comments)    Has patient had a PCN reaction causing immediate rash, facial/tongue/throat swelling, SOB or lightheadedness with hypotension: Yes Has patient had a PCN reaction causing severe rash involving mucus membranes or skin necrosis: No Has patient had a PCN reaction that required hospitalization No Has patient had a PCN reaction occurring within the last 10 years: No If all of the above answers are "NO", then may proceed with Cephalosporin use.    Medications reviewed.   ROS Full ROS performed and is otherwise negative other than what is stated in HPI   BP 101/68   Pulse 99   Temp (!) 97.2 F (36.2 C) (Temporal)   Ht 5\' 3"  (1.6 m)   Wt 176 lb 12.8 oz (80.2 kg)   SpO2 90%   BMI 31.32 kg/m   Physical Exam Vitals and nursing note reviewed. Exam conducted with a chaperone present.  Constitutional:      General: She is not in acute distress.    Appearance:  Normal appearance. She is normal weight.  Eyes:     General: No scleral icterus.       Right eye: No discharge.        Left eye: No discharge.  Cardiovascular:     Rate and Rhythm: Normal rate and regular rhythm.     Heart sounds: No murmur.  Pulmonary:     Effort: Pulmonary effort is normal. No respiratory distress.     Breath sounds: Normal breath sounds. No wheezing.     Comments: BREAST: Right lumpectomy scar and sentinel lymph node biopsy scar.  There is evidence of radiation changes in the right breast.  There is no new masses on either breast.  No evidence of lymphadenopathy. Abdominal:     General: There is no distension.     Palpations: Abdomen is soft. There is no mass.     Tenderness: There is no abdominal tenderness. There is no guarding or  rebound.     Hernia: No hernia is present.  Musculoskeletal:        General: Normal range of motion.     Cervical back: Normal range of motion and neck supple. No rigidity or tenderness.  Skin:    General: Skin is warm and dry.     Capillary Refill: Capillary refill takes less than 2 seconds.  Neurological:     General: No focal deficit present.     Mental Status: She is alert and oriented to person, place, and time.  Psychiatric:        Mood and Affect: Mood normal.        Behavior: Behavior normal.        Thought Content: Thought content normal.        Judgment: Judgment normal.      Assessment/Plan: 74 year old female with a history of breast cancer with no evidence of suspicious lesions on mammogram or physical exam.  We will continue to perform the next mammogram September 2021.  Regarding her hip pain will refer to orthopedic surgery. Greater than 50% of the 25 minutes  visit was spent in counseling/coordination of care   Caroleen Hamman, MD [place] [facility]

## 2020-01-18 NOTE — Patient Instructions (Addendum)
Patient will have a repeat Mammogram in September along with a follow up office visit. Referral was sent to Dr.Poggi's office with Orthopedics.   Breast Self-Awareness Breast self-awareness is knowing how your breasts look and feel. Doing breast self-awareness is important. It allows you to catch a breast problem early while it is still small and can be treated. All women should do breast self-awareness, including women who have had breast implants. Tell your doctor if you notice a change in your breasts. What you need:  A mirror.  A well-lit room. How to do a breast self-exam A breast self-exam is one way to learn what is normal for your breasts and to check for changes. To do a breast self-exam: Look for changes  1. Take off all the clothes above your waist. 2. Stand in front of a mirror in a room with good lighting. 3. Put your hands on your hips. 4. Push your hands down. 5. Look at your breasts and nipples in the mirror to see if one breast or nipple looks different from the other. Check to see if: ? The shape of one breast is different. ? The size of one breast is different. ? There are wrinkles, dips, and bumps in one breast and not the other. 6. Look at each breast for changes in the skin, such as: ? Redness. ? Scaly areas. 7. Look for changes in your nipples, such as: ? Liquid around the nipples. ? Bleeding. ? Dimpling. ? Redness. ? A change in where the nipples are. Feel for changes  1. Lie on your back on the floor. 2. Feel each breast. To do this, follow these steps: ? Pick a breast to feel. ? Put the arm closest to that breast above your head. ? Use your other arm to feel the nipple area of your breast. Feel the area with the pads of your three middle fingers by making small circles with your fingers. For the first circle, press lightly. For the second circle, press harder. For the third circle, press even harder. ? Keep making circles with your fingers at the  different pressures as you move down your breast. Stop when you feel your ribs. ? Move your fingers a little toward the center of your body. ? Start making circles with your fingers again, this time going up until you reach your collarbone. ? Keep making up-and-down circles until you reach your armpit. Remember to keep using the three pressures. ? Feel the other breast in the same way. 3. Sit or stand in the tub or shower. 4. With soapy water on your skin, feel each breast the same way you did in step 2 when you were lying on the floor. Write down what you find Writing down what you find can help you remember what to tell your doctor. Write down:  What is normal for each breast.  Any changes you find in each breast, including: ? The kind of changes you find. ? Whether you have pain. ? Size and location of any lumps.  When you last had your menstrual period. General tips  Check your breasts every month.  If you are breastfeeding, the best time to check your breasts is after you feed your baby or after you use a breast pump.  If you get menstrual periods, the best time to check your breasts is 5-7 days after your menstrual period is over.  With time, you will become comfortable with the self-exam, and you will begin to know  if there are changes in your breasts. Contact a doctor if you:  See a change in the shape or size of your breasts or nipples.  See a change in the skin of your breast or nipples, such as red or scaly skin.  Have fluid coming from your nipples that is not normal.  Find a lump or thick area that was not there before.  Have pain in your breasts.  Have any concerns about your breast health. Summary  Breast self-awareness includes looking for changes in your breasts, as well as feeling for changes within your breasts.  Breast self-awareness should be done in front of a mirror in a well-lit room.  You should check your breasts every month. If you get  menstrual periods, the best time to check your breasts is 5-7 days after your menstrual period is over.  Let your doctor know of any changes you see in your breasts, including changes in size, changes on the skin, pain or tenderness, or fluid from your nipples that is not normal. This information is not intended to replace advice given to you by your health care provider. Make sure you discuss any questions you have with your health care provider. Document Revised: 05/06/2018 Document Reviewed: 05/06/2018 Elsevier Patient Education  [place].

## 2020-01-25 ENCOUNTER — Telehealth: Payer: Self-pay

## 2020-01-25 NOTE — Telephone Encounter (Signed)
Spoke to Dr.Poggi's office to follow up to see if patient was scheduled. Ebony Hail states they have not received the referral for the patient. As the number listed is a fax for the front of the office. She provided me with a back direct fax number [phone]. All required documents for external referral were faxed over with attention to [facility]. Once received, patient will be contacted for an appointment. Verbalized understanding

## 2020-02-08 ENCOUNTER — Other Ambulatory Visit: Payer: Self-pay | Admitting: Family Medicine

## 2020-02-15 DIAGNOSIS — M1611 Unilateral primary osteoarthritis, right hip: Secondary | ICD-10-CM | POA: Diagnosis not present

## 2020-02-15 DIAGNOSIS — M25551 Pain in right hip: Secondary | ICD-10-CM | POA: Diagnosis not present

## 2020-02-24 ENCOUNTER — Encounter: Payer: Self-pay | Admitting: Radiation Oncology

## 2020-02-24 ENCOUNTER — Other Ambulatory Visit: Payer: Self-pay

## 2020-02-24 ENCOUNTER — Ambulatory Visit
Admission: RE | Admit: 2020-02-24 | Discharge: 2020-02-24 | Disposition: A | Discharge status: Home / Self Care | Payer: PPO | Source: Ambulatory Visit | Attending: Radiation Oncology | Admitting: Radiation Oncology

## 2020-02-24 VITALS — BP 106/72 | HR 83 | Temp 97.1°F | Resp 16

## 2020-02-24 DIAGNOSIS — Z170 Estrogen receptor positive status [ER+]: Secondary | ICD-10-CM | POA: Diagnosis not present

## 2020-02-24 DIAGNOSIS — Z79811 Long term (current) use of aromatase inhibitors: Secondary | ICD-10-CM | POA: Insufficient documentation

## 2020-02-24 DIAGNOSIS — Z923 Personal history of irradiation: Secondary | ICD-10-CM | POA: Insufficient documentation

## 2020-02-24 DIAGNOSIS — C50311 Malignant neoplasm of lower-inner quadrant of right female breast: Secondary | ICD-10-CM | POA: Insufficient documentation

## 2020-02-24 NOTE — Progress Notes (Signed)
Radiation Oncology Follow up Note  Name: Tammy Choi   Date:   02/24/2020 MRN:  [id number] DOB: Feb 13, 1946    This 74 y.o. female presents to the clinic today for 1 year follow-up status post whole breast radiation to her right breast for stage I invasive mammary carcinoma ER/PR positive.  REFERRING PROVIDER: Leone Haven, MD  HPI: Patient is a 74 year old female now seen at 1 year having completed whole breast radiation to her right breast for stage I ER/PR positive HER-2 negative invasive mammary carcinoma.  Seen today in routine follow-up she is doing well.  She specifically denies breast tenderness cough or bone pain.  She is currently on.  Arimidex tolerating it well without side effect.  She had mammograms back in April which were fine BI-RADS 2 which I have reviewed.  COMPLICATIONS OF TREATMENT: none  FOLLOW UP COMPLIANCE: keeps appointments   PHYSICAL EXAM:  BP 106/72 (BP Location: Right Arm, Patient Position: Sitting, Cuff Size: Normal)   Pulse 83   Temp (!) 97.1 F (36.2 C)   Resp 16  Lungs are clear to A&P cardiac examination essentially unremarkable with regular rate and rhythm. No dominant mass or nodularity is noted in either breast in 2 positions examined. Incision is well-healed. No axillary or supraclavicular adenopathy is appreciated. Cosmetic result is excellent.  Right breast is somewhat firmer than the left secondary to radiation treatment although cosmetic result is still good to excellent.  Well-developed well-nourished patient in NAD. HEENT reveals PERLA, EOMI, discs not visualized.  Oral cavity is clear. No oral mucosal lesions are identified. Neck is clear without evidence of cervical or supraclavicular adenopathy. Lungs are clear to A&P. Cardiac examination is essentially unremarkable with regular rate and rhythm without murmur rub or thrill. Abdomen is benign with no organomegaly or masses noted. Motor sensory and DTR levels are equal and symmetric in  the upper and lower extremities. Cranial nerves II through XII are grossly intact. Proprioception is intact. No peripheral adenopathy or edema is identified. No motor or sensory levels are noted. Crude visual fields are within normal range.  RADIOLOGY RESULTS: Mammograms reviewed compatible with above-stated findings  PLAN: Present time patient is doing well with no evidence of disease 1 year out.  I am pleased with her overall progress.  I have asked to see her back in 1 year for follow-up.  Patient knows to call with any concerns.  I would like to take this opportunity to thank you for allowing me to participate in the care of your patient.Noreene Filbert, MD

## 2020-03-03 ENCOUNTER — Ambulatory Visit: Payer: PPO

## 2020-03-09 ENCOUNTER — Ambulatory Visit (INDEPENDENT_AMBULATORY_CARE_PROVIDER_SITE_OTHER): Payer: PPO

## 2020-03-09 VITALS — Ht 63.0 in | Wt 176.0 lb

## 2020-03-09 DIAGNOSIS — Z0000 Encounter for general adult medical examination without abnormal findings: Secondary | ICD-10-CM | POA: Diagnosis not present

## 2020-03-09 NOTE — Patient Instructions (Addendum)
  Tammy Choi , Thank you for taking time to come for your Medicare Wellness Visit. I appreciate your ongoing commitment to your health goals. Please review the following plan we discussed and let me know if I can assist you in the future.   These are the goals we discussed: Goals      Patient Stated   . Follow up with Primary Care Provider (pt-stated)       This is a list of the screening recommended for you and due dates:  Health Maintenance  Topic Date Due  .  Hepatitis C: One time screening is recommended by Center for Disease Control  (CDC) for  adults born from [birth year] through 1966.   Never done  . COVID-19 Vaccine (1) Never done  . Tetanus Vaccine  Never done  . DEXA scan (bone density measurement)  Never done  . Mammogram  12/03/2019  . Flu Shot  05/01/2020  . Colon Cancer Screening  05/06/2023  . Pneumonia vaccines  Completed

## 2020-03-09 NOTE — Progress Notes (Signed)
Subjective:   Tammy Choi is a 74 y.o. female who presents for Medicare Annual (Subsequent) preventive examination.  Review of Systems:  No ROS.  Medicare Wellness Virtual Visit.   Cardiac Risk Factors include: advanced age (>40men, >65 women);hypertension     Objective:     Vitals: Ht 5\' 3"  (1.6 m)   Wt 176 lb (79.8 kg)   BMI 31.18 kg/m   Body mass index is 31.18 kg/m.  Advanced Directives 03/09/2020 02/24/2020 10/20/2019 08/24/2019 05/26/2019 03/03/2019 02/24/2019  Does Patient Have a Medical Advance Directive? No No No No No No No  Would patient like information on creating a medical advance directive? Yes (MAU/Ambulatory/Procedural Areas - Information given) No - Patient declined - No - Patient declined - No - Patient declined -    Tobacco Social History   Tobacco Use  Smoking Status Former Smoker  . Packs/day: 1.00  . Years: 35.00  . Pack years: 35.00  . Types: Cigarettes  . Quit date: 12/20/2015  . Years since quitting: 4.2  Smokeless Tobacco Never Used     Counseling given: Not Answered   Clinical Intake:  Pre-visit preparation completed: Yes        Diabetes: Yes  How often do you need to have someone help you when you read instructions, pamphlets, or other written materials from your doctor or pharmacy?: 1 - Never  Interpreter Needed?: No     Past Medical History:  Diagnosis Date  . Breast cancer ([place]) 05/07/2018   T1a, N0; ER/ PR positive, Her 2 neu not overexpressed.   Marland Kitchen COPD (chronic obstructive pulmonary disease) (HCC)    Not on home o2  . Depression   . HTN (hypertension)   . Hyperlipemia   . Personal history of radiation therapy    Past Surgical History:  Procedure Laterality Date  . ABDOMINAL HYSTERECTOMY    . BREAST BIOPSY Left 04/30/2013   neg core  . BREAST BIOPSY Right 05/07/2018   right breast stereo x clip INVASIVE MAMMARY CARCINOMA  . BREAST LUMPECTOMY Right 2019  . BREAST LUMPECTOMY WITH SENTINEL LYMPH NODE BIOPSY Right  06/16/2018   Procedure: BREAST LUMPECTOMY WITH SENTINEL LYMPH NODE BX;  Surgeon: Robert Bellow, MD;  Location: [facility] ORS;  Service: General;  Laterality: Right;  . COLONOSCOPY  2014   Dr Bary Castilla  . Left Leg Surgery    . TONSILLECTOMY     Family History  Problem Relation Age of Onset  . Kidney disease Mother        deceased 85  . Heart disease Father        deceased 8  . Breast cancer Other 73       maternal half-sister; deceased 56  . Colon cancer Neg Hx    Social History   Socioeconomic History  . Marital status: Divorced    Spouse name: Not on file  . Number of children: Not on file  . Years of education: Not on file  . Highest education level: Not on file  Occupational History  . Occupation: Retired Database administrator: retired  Tobacco Use  . Smoking status: Former Smoker    Packs/day: 1.00    Years: 35.00    Pack years: 35.00    Types: Cigarettes    Quit date: 12/20/2015    Years since quitting: 4.2  . Smokeless tobacco: Never Used  Substance and Sexual Activity  . Alcohol use: No    Alcohol/week: 0.0 standard drinks  .  Drug use: No  . Sexual activity: Not on file  Other Topics Concern  . Not on file  Social History Narrative   Lives with daughter at home. Independent at baseline.   Social Determinants of Health   Financial Resource Strain:   . Difficulty of Paying Living Expenses:   Food Insecurity:   . Worried About Charity fundraiser in the Last Year:   . Arboriculturist in the Last Year:   Transportation Needs:   . Film/video editor (Medical):   Marland Kitchen Lack of Transportation (Non-Medical):   Physical Activity:   . Days of Exercise per Week:   . Minutes of Exercise per Session:   Stress:   . Feeling of Stress :   Social Connections:   . Frequency of Communication with Friends and Family:   . Frequency of Social Gatherings with Friends and Family:   . Attends Religious Services:   . Active Member of Clubs or Organizations:   .  Attends Archivist Meetings:   Marland Kitchen Marital Status:     Outpatient Encounter Medications as of 03/09/2020  Medication Sig  . acetaminophen (TYLENOL) 500 MG tablet Take 500-1,000 mg by mouth 2 (two) times daily as needed for moderate pain or headache.  . anastrozole (ARIMIDEX) 1 MG tablet Take 1 tablet by mouth once daily  . budesonide-formoterol (SYMBICORT) 160-4.5 MCG/ACT inhaler Inhale 2 puffs into the lungs 2 (two) times daily.  . cholecalciferol (VITAMIN D) 1000 UNITS tablet Take 2,000 Units by mouth every other day.   Marland Kitchen FLUoxetine (PROZAC) 40 MG capsule Take 1 capsule by mouth at bedtime  . fluticasone (FLONASE) 50 MCG/ACT nasal spray Place 2 sprays into both nostrils daily.  . furosemide (LASIX) 20 MG tablet Take 0.5 tablets (10 mg total) by mouth daily as needed for edema.  . hydrochlorothiazide (HYDRODIURIL) 12.5 MG tablet Take 1 tablet by mouth once daily  . lisinopril (ZESTRIL) 40 MG tablet Take 1 tablet by mouth once daily  . Multiple Vitamins-Minerals (MULTIVITAMIN WITH MINERALS) tablet Take 1 tablet by mouth every other day.   . nitrofurantoin, macrocrystal-monohydrate, (MACROBID) 100 MG capsule Take 1 capsule (100 mg total) by mouth 2 (two) times daily.  Marland Kitchen PROAIR HFA 108 (90 Base) MCG/ACT inhaler INHALE 2 PUFFS BY MOUTH EVERY 6 HOURS AS NEEDED FOR WHEEZING OR SHORTNESS OF BREATH  . rosuvastatin (CRESTOR) 5 MG tablet Take 1 tablet by mouth once daily  . trimethoprim-polymyxin b (POLYTRIM) ophthalmic solution Place 1 drop into the left eye every 4 (four) hours.   No facility-administered encounter medications on file as of 03/09/2020.    Activities of Daily Living In your present state of health, do you have any difficulty performing the following activities: 03/09/2020  Hearing? N  Vision? N  Difficulty concentrating or making decisions? N  Walking or climbing stairs? Y  Comment Chronic R hip pain  Dressing or bathing? N  Doing errands, shopping? Y  Comment She does not  Physiological scientist and eating ? Y  Comment Meal prep assist by daughter. Self feeds.  Using the Toilet? N  In the past six months, have you accidently leaked urine? Y  Comment Managed with daily brief  Do you have problems with loss of bowel control? N  Managing your Medications? N  Managing your Finances? N  Housekeeping or managing your Housekeeping? Y  Comment Assisted by daughter  Some recent data might be hidden    Patient Care Team: [name]  Darnell Level, MD as PCP - General (Family Medicine) Bary Castilla [name], MD (General Surgery)    Assessment:   This is a routine wellness examination for Kylei.  I connected with Amazin today by telephone and verified that I am speaking with the correct person using two identifiers. Location patient: home Location provider: work Persons participating in the virtual visit: patient, Marine scientist.    I discussed the limitations, risks, security and privacy concerns of performing an evaluation and management service by telephone and the availability of in person appointments.The patient expressed understanding and verbally consented to this telephonic visit.    Interactive audio and video telecommunications were attempted between this provider and patient, however failed, due to patient having technical difficulties OR patient did not have access to video capability.  We continued and completed visit with audio only.  Some vital signs may be absent or patient reported.   Health Maintenance Due: -Tdap vaccine- discussed; to be completed with doctor in visit or local pharmacy.   -Dexa Scan- plans to reschedule due to upcoming R hip surgery. -Mammogram- plans to schedule 06/2020. -Hep C Screening- plans to complete with oncology -Covid vaccines- deferred per patient preference; plans to complete -See completed HM at the end of note.   Eye: Visual acuity not assessed. Virtual visit. Followed by their  ophthalmologist.  Dental: UTD  Hearing: Demonstrates normal hearing during visit.  Safety:  Patient feels safe at home- yes Patient does have smoke detectors at home- yes Patient does wear sunscreen or protective clothing when in direct sunlight - yes Patient does wear seat belt when in a moving vehicle - yes Patient drives- no Adequate lighting in walkways free from debris- yes Grab bars and handrails used as appropriate- yes Ambulates with an assistive device- yes; cane  Social: Alcohol intake - no      Smoking history- former   Smokers in home? none Illicit drug use? none  Medication: Taking as directed and without issues.  Self managed - yes   Covid-19: Precautions and sickness symptoms discussed. Wears mask, social distancing, hand hygiene as appropriate.   Activities of Daily Living Patient denies needing assistance with: feeding themselves, getting from bed to chair, getting to the toilet, bathing/showering, dressing, managing money.  Assisted by daughter with household chores and preparing meals as needed.   Discussed the importance of a healthy diet, water intake and the benefits of aerobic exercise.   Physical activity- limited due to chronic hip pain  Diet:  Dash diet Water: good intake Caffeine: 2-3 cups of coffee  Other Providers Patient Care Team: Leone Haven, MD as PCP - General (Family Medicine) Bary Castilla, [name], MD (General Surgery)  Exercise Activities and Dietary recommendations Current Exercise Habits: The patient does not participate in regular exercise at present, Intensity: Mild  Goals      Patient Stated   . Follow up with Primary Care Provider (pt-stated)       Fall Risk Fall Risk  03/09/2020 01/18/2020 06/29/2019 06/01/2019 06/01/2019  Falls in the past year? 0 0 0 0 0  Number falls in past yr: 0 0 - 0 0  Injury with Fall? - 0 - 0 0  Risk for fall due to : Impaired balance/gait - - - -  Follow up Falls evaluation completed -  - - -   Is the patient's home free of loose throw rugs in walkways, pet beds, electrical cords, etc?         Grab bars in the bathroom?  Handrails on the stairs?         Adequate lighting?     Timed Get Up and Go performed: No, virtual visit  Depression Screen PHQ 2/9 Scores 03/09/2020 10/23/2019 06/01/2019 03/03/2019  PHQ - 2 Score 0 0 0 0     Cognitive Function  Patient is alert and oriented x3. Patient denies difficulty focusing or concentrating. Patient likes to read for brain health.    6CIT Screen 03/09/2020 03/03/2019  What Year? 0 points 0 points  What month? 0 points 0 points  What time? - 0 points  Count back from 20 - 0 points  Months in reverse 0 points 0 points  Repeat phrase 4 points 0 points  Total Score - 0    Immunization History  Administered Date(s) Administered  . Fluad Quad(high Dose 65+) 06/01/2019  . Influenza Split 08/07/2011, 07/16/2012  . Influenza, High Dose Seasonal PF 07/10/2016, 08/01/2017, 07/10/2018  . Influenza,inj,Quad PF,6+ Mos 07/30/2014, 07/21/2015  . Influenza-Unspecified 07/27/2013  . Pneumococcal Conjugate-13 03/30/2014  . Pneumococcal Polysaccharide-23 07/16/2010   Screening Tests Health Maintenance  Topic Date Due  . Hepatitis C Screening  Never done  . COVID-19 Vaccine (1) Never done  . TETANUS/TDAP  Never done  . DEXA SCAN  Never done  . MAMMOGRAM  12/03/2019  . INFLUENZA VACCINE  05/01/2020  . COLONOSCOPY  05/06/2023  . PNA vac Low Risk Adult  Completed      Plan:   Keep all routine maintenance appointments.   Nurse notes: Patient does not drive. Transportation offered for future medical appointments, declined. Agrees to call the office and notify if needed.   End of life planning; Advanced aging; Advanced directives discussed.  No HCPOA/Living Will.  Additional information mailed to help them start the conversation with family, per patient preference.   Medicare Attestation I have personally reviewed: The patient's  medical and social history Their use of alcohol, tobacco or illicit drugs Their current medications and supplements The patient's functional ability including ADLs,fall risks, home safety risks, cognitive, and hearing and visual impairment Diet and physical activities Evidence for depression   I have reviewed and discussed with patient certain preventive protocols, quality metrics, and best practice recommendations.      Varney Biles, LPN  06/09/7740

## 2020-03-09 NOTE — Progress Notes (Signed)
I have reviewed the above note and agree.  Emileigh Kellett, M.D.  

## 2020-03-11 NOTE — Addendum Note (Signed)
Addended by: Dia Crawford on: 03/11/2020 10:06 AM   Modules accepted: Level of Service, SmartSet

## 2020-03-16 ENCOUNTER — Other Ambulatory Visit: Payer: Self-pay | Admitting: Family Medicine

## 2020-03-16 ENCOUNTER — Other Ambulatory Visit: Payer: Self-pay | Admitting: Oncology

## 2020-03-18 ENCOUNTER — Other Ambulatory Visit: Payer: Self-pay | Admitting: Surgery

## 2020-03-21 ENCOUNTER — Encounter
Admission: RE | Admit: 2020-03-21 | Discharge: 2020-03-21 | Disposition: A | Discharge status: Home / Self Care | Payer: PPO | Source: Ambulatory Visit | Attending: Surgery | Admitting: Surgery

## 2020-03-21 ENCOUNTER — Other Ambulatory Visit: Payer: Self-pay

## 2020-03-21 HISTORY — DX: Unspecified osteoarthritis, unspecified site: M19.90

## 2020-03-21 NOTE — Patient Instructions (Signed)
Your procedure is scheduled on: Tuesday March 29, 2020. Report to Day Surgery inside [facility] 2nd floor. To find out your arrival time please call [phone] between 1PM - 3PM on Monday March 28, 2020.  Remember: Instructions that are not followed completely may result in serious medical risk,  up to and including death, or upon the discretion of your surgeon and anesthesiologist your  surgery may need to be rescheduled.     _X__ 1. Do not eat food after midnight the night before your procedure.                 No gum chewing or hard candies. You may drink clear liquids up to 2 hours                 before you are scheduled to arrive for your surgery- DO not drink clear                 liquids within 2 hours of the start of your surgery.                 Clear Liquids include:  water, apple juice without pulp, clear Gatorade, G2 or                  Gatorade Zero (avoid Red/Purple/Blue), Black Coffee or Tea (Do not add                 anything to coffee or tea).  __X__2. Complete the carbohydrate drink provided to you, 2 hours before arrival.  __X__3.  On the morning of surgery brush your teeth with toothpaste and water, you                may rinse your mouth with mouthwash if you wish.  Do not swallow any toothpaste of mouthwash.     _X__ 4.  No Alcohol for 24 hours before or after surgery.   _X__ 5.  Do Not Smoke or use e-cigarettes For 24 Hours Prior to Your Surgery.                 Do not use any chewable tobacco products for at least 6 hours prior to                 Surgery.  _X__  6.  Do not use any recreational drugs (marijuana, cocaine, heroin, ecstacy, MDMA or other)                For at least one week prior to your surgery.  Combination of these drugs with anesthesia                May have life threatening results.  __X__  7.  Notify your doctor if there is any change in your medical condition      (cold, fever, infections).     Do not wear  jewelry, make-up, hairpins, clips or nail polish. Do not wear lotions, powders, or perfumes. You may wear deodorant. Do not shave 48 hours prior to surgery. Men may shave face and neck. Do not bring valuables to the hospital.    [facility] is not responsible for any belongings or valuables.  Contacts, dentures or bridgework may not be worn into surgery. Leave your suitcase in the car. After surgery it may be brought to your room. For patients admitted to the hospital, discharge time is determined by your treatment team.   Patients discharged the day of surgery will  not be allowed to drive home.   Make arrangements for someone to be with you for the first 24 hours of your Same Day Discharge.  ____ Take these medicines the morning of surgery with A SIP OF WATER:    1. none  __X__ Use CHG Soap as directed  __X__ Use inhalers on the day of surgery  budesonide-formoterol (SYMBICORT) 160-4.5 MCG/ACT inhaler   PROAIR HFA 108 (90 Base) MCG/ACT inhaler  __X__ Stop Anti-inflammatories such as Ibuprofen, Aleve, Advil, naproxen, aspirin and or BC powders.   __X__ Stop supplements until after surgery.    __X__ Do not start any herbal supplements before your surgery.

## 2020-03-25 ENCOUNTER — Other Ambulatory Visit: Payer: Self-pay

## 2020-03-25 ENCOUNTER — Other Ambulatory Visit
Admission: RE | Admit: 2020-03-25 | Discharge: 2020-03-25 | Disposition: A | Discharge status: Home / Self Care | Payer: PPO | Source: Ambulatory Visit | Attending: Surgery | Admitting: Surgery

## 2020-03-25 DIAGNOSIS — Z01818 Encounter for other preprocedural examination: Secondary | ICD-10-CM | POA: Insufficient documentation

## 2020-03-25 DIAGNOSIS — I10 Essential (primary) hypertension: Secondary | ICD-10-CM | POA: Insufficient documentation

## 2020-03-25 DIAGNOSIS — Z20822 Contact with and (suspected) exposure to covid-19: Secondary | ICD-10-CM | POA: Diagnosis not present

## 2020-03-25 LAB — CBC
HCT: 37.8 % (ref 36.0–46.0)
Hemoglobin: 12.4 g/dL (ref 12.0–15.0)
MCH: 28.6 pg (ref 26.0–34.0)
MCHC: 32.8 g/dL (ref 30.0–36.0)
MCV: 87.3 fL (ref 80.0–100.0)
Platelets: 247 10*3/uL (ref 150–400)
RBC: 4.33 MIL/uL (ref 3.87–5.11)
RDW: 15.4 % (ref 11.5–15.5)
WBC: 7.7 10*3/uL (ref 4.0–10.5)
nRBC: 0 % (ref 0.0–0.2)

## 2020-03-25 LAB — BASIC METABOLIC PANEL
Anion gap: 12 (ref 5–15)
BUN: 55 mg/dL — ABNORMAL HIGH (ref 8–23)
CO2: 26 mmol/L (ref 22–32)
Calcium: 9.4 mg/dL (ref 8.9–10.3)
Chloride: 99 mmol/L (ref 98–111)
Creatinine, Ser: 1.58 mg/dL — ABNORMAL HIGH (ref 0.44–1.00)
GFR calc Af Amer: 37 mL/min — ABNORMAL LOW (ref >60)
GFR calc non Af Amer: 32 mL/min — ABNORMAL LOW (ref >60)
Glucose, Bld: 104 mg/dL — ABNORMAL HIGH (ref 70–99)
Potassium: 4.9 mmol/L (ref 3.5–5.1)
Sodium: 137 mmol/L (ref 135–145)

## 2020-03-25 NOTE — Progress Notes (Signed)
  [facility] Perioperative Services: Pre-Admission/Anesthesia Testing  Abnormal Lab Notification  Date: 03/25/20  Name: DANY HARTEN MRN:   [id number]  Re: Abnormal labs noted during PAT appointment  Provider Notified: Corky Mull, MD Notification mode: Faxed via [place] of concern:  BUN 55 and creatinine 1.55 mg/dL (previously 34 and 1.09 mg/dL on 10/19/2019). Estimated Creatinine Clearance: 31.3 mL/min (A) (by C-G formula based on SCr of 1.58 mg/dL (H)).  Honor Loh, MSN, APRN, FNP-C, CEN [facility]  Peri-operative Services Nurse Practitioner Phone: [phone] 03/25/20 1:06 PM

## 2020-03-26 LAB — SARS CORONAVIRUS 2 (TAT 6-24 HRS): SARS Coronavirus 2: NEGATIVE

## 2020-03-28 ENCOUNTER — Other Ambulatory Visit: Payer: PPO

## 2020-03-29 ENCOUNTER — Inpatient Hospital Stay: Payer: PPO | Admitting: Certified Registered Nurse Anesthetist

## 2020-03-29 ENCOUNTER — Encounter: Payer: Self-pay | Admitting: Surgery

## 2020-03-29 ENCOUNTER — Other Ambulatory Visit: Payer: Self-pay

## 2020-03-29 ENCOUNTER — Inpatient Hospital Stay: Payer: PPO

## 2020-03-29 ENCOUNTER — Inpatient Hospital Stay
Admission: RE | Admit: 2020-03-29 | Discharge: 2020-04-01 | DRG: 470 | Disposition: A | Discharge status: Home / Self Care | Payer: PPO | Attending: Surgery | Admitting: Surgery

## 2020-03-29 ENCOUNTER — Encounter
Admission: RE | Disposition: A | Discharge status: Home / Self Care | Payer: Self-pay | Source: Home / Self Care | Attending: Surgery

## 2020-03-29 DIAGNOSIS — Z853 Personal history of malignant neoplasm of breast: Secondary | ICD-10-CM

## 2020-03-29 DIAGNOSIS — Z923 Personal history of irradiation: Secondary | ICD-10-CM

## 2020-03-29 DIAGNOSIS — Z87891 Personal history of nicotine dependence: Secondary | ICD-10-CM | POA: Diagnosis not present

## 2020-03-29 DIAGNOSIS — N189 Chronic kidney disease, unspecified: Secondary | ICD-10-CM | POA: Diagnosis not present

## 2020-03-29 DIAGNOSIS — F329 Major depressive disorder, single episode, unspecified: Secondary | ICD-10-CM | POA: Diagnosis present

## 2020-03-29 DIAGNOSIS — Z841 Family history of disorders of kidney and ureter: Secondary | ICD-10-CM | POA: Diagnosis not present

## 2020-03-29 DIAGNOSIS — Z96641 Presence of right artificial hip joint: Secondary | ICD-10-CM | POA: Diagnosis not present

## 2020-03-29 DIAGNOSIS — Z7951 Long term (current) use of inhaled steroids: Secondary | ICD-10-CM

## 2020-03-29 DIAGNOSIS — Z79899 Other long term (current) drug therapy: Secondary | ICD-10-CM

## 2020-03-29 DIAGNOSIS — E785 Hyperlipidemia, unspecified: Secondary | ICD-10-CM | POA: Diagnosis present

## 2020-03-29 DIAGNOSIS — I129 Hypertensive chronic kidney disease with stage 1 through stage 4 chronic kidney disease, or unspecified chronic kidney disease: Secondary | ICD-10-CM | POA: Diagnosis not present

## 2020-03-29 DIAGNOSIS — M1611 Unilateral primary osteoarthritis, right hip: Secondary | ICD-10-CM | POA: Diagnosis not present

## 2020-03-29 DIAGNOSIS — I10 Essential (primary) hypertension: Secondary | ICD-10-CM | POA: Diagnosis not present

## 2020-03-29 DIAGNOSIS — Z471 Aftercare following joint replacement surgery: Secondary | ICD-10-CM | POA: Diagnosis not present

## 2020-03-29 DIAGNOSIS — J449 Chronic obstructive pulmonary disease, unspecified: Secondary | ICD-10-CM | POA: Diagnosis not present

## 2020-03-29 DIAGNOSIS — Z90710 Acquired absence of both cervix and uterus: Secondary | ICD-10-CM | POA: Diagnosis not present

## 2020-03-29 DIAGNOSIS — Z8249 Family history of ischemic heart disease and other diseases of the circulatory system: Secondary | ICD-10-CM | POA: Diagnosis not present

## 2020-03-29 DIAGNOSIS — Z880 Allergy status to penicillin: Secondary | ICD-10-CM

## 2020-03-29 HISTORY — PX: TOTAL HIP ARTHROPLASTY: SHX124

## 2020-03-29 LAB — TYPE AND SCREEN
ABO/RH(D): A POS
Antibody Screen: NEGATIVE

## 2020-03-29 LAB — ABO/RH: ABO/RH(D): A POS

## 2020-03-29 SURGERY — ARTHROPLASTY, HIP, TOTAL,POSTERIOR APPROACH
Anesthesia: Spinal | Site: Hip | Laterality: Right

## 2020-03-29 MED ORDER — SODIUM CHLORIDE 0.9 % IV SOLN: INTRAVENOUS | Status: DC

## 2020-03-29 MED ORDER — FUROSEMIDE 20 MG PO TABS: 10.0000 mg | ORAL_TABLET | Freq: Every day | ORAL | Status: DC | PRN

## 2020-03-29 MED ORDER — CLINDAMYCIN PHOSPHATE 900 MG/50ML IV SOLN
900.0000 mg | INTRAVENOUS | Status: AC
  Administered 2020-03-29: 900 mg via INTRAVENOUS

## 2020-03-29 MED ORDER — OXYCODONE HCL 5 MG/5ML PO SOLN: 5.0000 mg | Freq: Once | ORAL | Status: DC | PRN

## 2020-03-29 MED ORDER — EPHEDRINE SULFATE 50 MG/ML IJ SOLN
INTRAMUSCULAR | Status: DC | PRN
  Administered 2020-03-29: 10 mg via INTRAVENOUS

## 2020-03-29 MED ORDER — FLEET ENEMA 7-19 GM/118ML RE ENEM: 1.0000 | ENEMA | Freq: Once | RECTAL | Status: DC | PRN

## 2020-03-29 MED ORDER — LACTATED RINGERS IV SOLN: INTRAVENOUS | Status: DC

## 2020-03-29 MED ORDER — ACETAMINOPHEN 10 MG/ML IV SOLN
INTRAVENOUS | Status: AC
  Filled 2020-03-29: qty 100

## 2020-03-29 MED ORDER — OXYCODONE HCL 5 MG PO TABS: 5.0000 mg | ORAL_TABLET | Freq: Once | ORAL | Status: DC | PRN

## 2020-03-29 MED ORDER — HYDROMORPHONE HCL 1 MG/ML IJ SOLN: 0.2500 mg | INTRAMUSCULAR | Status: DC | PRN

## 2020-03-29 MED ORDER — CLINDAMYCIN PHOSPHATE 600 MG/50ML IV SOLN
600.0000 mg | Freq: Four times a day (QID) | INTRAVENOUS | Status: AC
  Administered 2020-03-29 – 2020-03-30 (×3): 600 mg via INTRAVENOUS
  Filled 2020-03-29 (×3): qty 50

## 2020-03-29 MED ORDER — METOCLOPRAMIDE HCL 10 MG PO TABS: 5.0000 mg | ORAL_TABLET | Freq: Three times a day (TID) | ORAL | Status: DC | PRN

## 2020-03-29 MED ORDER — FLUTICASONE PROPIONATE 50 MCG/ACT NA SUSP
2.0000 | Freq: Every day | NASAL | Status: DC | PRN
  Filled 2020-03-29: qty 16

## 2020-03-29 MED ORDER — PROPOFOL 500 MG/50ML IV EMUL
INTRAVENOUS | Status: AC
  Filled 2020-03-29: qty 50

## 2020-03-29 MED ORDER — ORAL CARE MOUTH RINSE: 15.0000 mL | Freq: Once | OROMUCOSAL | Status: AC

## 2020-03-29 MED ORDER — PHENYLEPHRINE HCL (PRESSORS) 10 MG/ML IV SOLN
INTRAVENOUS | Status: DC | PRN
  Administered 2020-03-29 (×2): 100 ug via INTRAVENOUS

## 2020-03-29 MED ORDER — DEXAMETHASONE SODIUM PHOSPHATE 10 MG/ML IJ SOLN
INTRAMUSCULAR | Status: DC | PRN
  Administered 2020-03-29: 10 mg via INTRAVENOUS

## 2020-03-29 MED ORDER — LISINOPRIL 20 MG PO TABS
40.0000 mg | ORAL_TABLET | Freq: Every day | ORAL | Status: DC
  Administered 2020-04-01: 40 mg via ORAL
  Filled 2020-03-29 (×3): qty 2

## 2020-03-29 MED ORDER — TRANEXAMIC ACID 1000 MG/10ML IV SOLN
INTRAVENOUS | Status: DC | PRN
  Administered 2020-03-29: 1000 mg via TOPICAL

## 2020-03-29 MED ORDER — ONDANSETRON HCL 4 MG/2ML IJ SOLN: 4.0000 mg | Freq: Four times a day (QID) | INTRAMUSCULAR | Status: DC | PRN

## 2020-03-29 MED ORDER — OXYCODONE HCL 5 MG PO TABS
5.0000 mg | ORAL_TABLET | ORAL | Status: DC | PRN
  Administered 2020-03-29: 10 mg via ORAL
  Administered 2020-03-29 – 2020-03-30 (×3): 5 mg via ORAL
  Administered 2020-03-31 (×4): 10 mg via ORAL
  Administered 2020-03-31 – 2020-04-01 (×3): 5 mg via ORAL
  Filled 2020-03-29 (×3): qty 1
  Filled 2020-03-29 (×2): qty 2
  Filled 2020-03-29: qty 1
  Filled 2020-03-29: qty 2
  Filled 2020-03-29 (×2): qty 1
  Filled 2020-03-29 (×2): qty 2

## 2020-03-29 MED ORDER — HYDROCHLOROTHIAZIDE 12.5 MG PO CAPS
12.5000 mg | ORAL_CAPSULE | Freq: Every evening | ORAL | Status: DC
  Administered 2020-03-30 – 2020-03-31 (×2): 12.5 mg via ORAL
  Filled 2020-03-29 (×3): qty 1

## 2020-03-29 MED ORDER — BUPIVACAINE HCL (PF) 0.5 % IJ SOLN
INTRAMUSCULAR | Status: DC | PRN
  Administered 2020-03-29: 2.5 mL

## 2020-03-29 MED ORDER — FENTANYL CITRATE (PF) 100 MCG/2ML IJ SOLN
INTRAMUSCULAR | Status: DC | PRN
  Administered 2020-03-29: 25 ug via INTRAVENOUS

## 2020-03-29 MED ORDER — TRAMADOL HCL 50 MG PO TABS
50.0000 mg | ORAL_TABLET | Freq: Four times a day (QID) | ORAL | Status: DC | PRN
  Administered 2020-03-30 – 2020-04-01 (×2): 50 mg via ORAL
  Filled 2020-03-29 (×2): qty 1

## 2020-03-29 MED ORDER — ALBUTEROL SULFATE HFA 108 (90 BASE) MCG/ACT IN AERS: 1.0000 | INHALATION_SPRAY | Freq: Four times a day (QID) | RESPIRATORY_TRACT | Status: DC | PRN

## 2020-03-29 MED ORDER — ANASTROZOLE 1 MG PO TABS
1.0000 mg | ORAL_TABLET | Freq: Every day | ORAL | Status: DC
  Administered 2020-03-29 – 2020-04-01 (×4): 1 mg via ORAL
  Filled 2020-03-29 (×4): qty 1

## 2020-03-29 MED ORDER — PROPOFOL 500 MG/50ML IV EMUL
INTRAVENOUS | Status: DC | PRN
  Administered 2020-03-29: 125 ug/kg/min via INTRAVENOUS

## 2020-03-29 MED ORDER — FENTANYL CITRATE (PF) 100 MCG/2ML IJ SOLN
INTRAMUSCULAR | Status: AC
  Filled 2020-03-29: qty 2

## 2020-03-29 MED ORDER — MAGNESIUM HYDROXIDE 400 MG/5ML PO SUSP
30.0000 mL | Freq: Every day | ORAL | Status: DC | PRN
  Administered 2020-03-30: 30 mL via ORAL
  Filled 2020-03-29: qty 30

## 2020-03-29 MED ORDER — DOCUSATE SODIUM 100 MG PO CAPS
100.0000 mg | ORAL_CAPSULE | Freq: Two times a day (BID) | ORAL | Status: DC
  Administered 2020-03-29 – 2020-03-30 (×3): 100 mg via ORAL
  Filled 2020-03-29 (×6): qty 1

## 2020-03-29 MED ORDER — KETOROLAC TROMETHAMINE 15 MG/ML IJ SOLN
INTRAMUSCULAR | Status: AC
  Filled 2020-03-29: qty 1

## 2020-03-29 MED ORDER — CHLORHEXIDINE GLUCONATE 0.12 % MT SOLN
15.0000 mL | Freq: Once | OROMUCOSAL | Status: AC
  Administered 2020-03-29: 15 mL via OROMUCOSAL

## 2020-03-29 MED ORDER — EPHEDRINE 5 MG/ML INJ
INTRAVENOUS | Status: AC
  Filled 2020-03-29: qty 10

## 2020-03-29 MED ORDER — ONDANSETRON HCL 4 MG PO TABS: 4.0000 mg | ORAL_TABLET | Freq: Four times a day (QID) | ORAL | Status: DC | PRN

## 2020-03-29 MED ORDER — METOCLOPRAMIDE HCL 5 MG/ML IJ SOLN: 5.0000 mg | Freq: Three times a day (TID) | INTRAMUSCULAR | Status: DC | PRN

## 2020-03-29 MED ORDER — VITAMIN D 25 MCG (1000 UNIT) PO TABS
2000.0000 [IU] | ORAL_TABLET | ORAL | Status: DC
  Administered 2020-03-31: 2000 [IU] via ORAL
  Filled 2020-03-29: qty 2

## 2020-03-29 MED ORDER — PROPOFOL 10 MG/ML IV BOLUS
INTRAVENOUS | Status: AC
  Filled 2020-03-29: qty 20

## 2020-03-29 MED ORDER — ACETAMINOPHEN 10 MG/ML IV SOLN
INTRAVENOUS | Status: DC | PRN
  Administered 2020-03-29: 1000 mg via INTRAVENOUS

## 2020-03-29 MED ORDER — ENOXAPARIN SODIUM 40 MG/0.4ML ~~LOC~~ SOLN
40.0000 mg | SUBCUTANEOUS | Status: DC
  Administered 2020-03-30 – 2020-04-01 (×3): 40 mg via SUBCUTANEOUS
  Filled 2020-03-29 (×3): qty 0.4

## 2020-03-29 MED ORDER — ACETAMINOPHEN 500 MG PO TABS
1000.0000 mg | ORAL_TABLET | Freq: Four times a day (QID) | ORAL | Status: AC
  Administered 2020-03-29 – 2020-03-30 (×3): 1000 mg via ORAL
  Filled 2020-03-29 (×3): qty 2

## 2020-03-29 MED ORDER — SODIUM CHLORIDE 0.9 % IV SOLN
INTRAVENOUS | Status: DC | PRN
  Administered 2020-03-29: 60 mL

## 2020-03-29 MED ORDER — ALBUTEROL SULFATE (2.5 MG/3ML) 0.083% IN NEBU: 2.5000 mg | INHALATION_SOLUTION | Freq: Four times a day (QID) | RESPIRATORY_TRACT | Status: DC | PRN

## 2020-03-29 MED ORDER — FENTANYL CITRATE (PF) 100 MCG/2ML IJ SOLN
25.0000 ug | INTRAMUSCULAR | Status: DC | PRN
  Administered 2020-03-29: 25 ug via INTRAVENOUS

## 2020-03-29 MED ORDER — KETOROLAC TROMETHAMINE 15 MG/ML IJ SOLN
15.0000 mg | Freq: Once | INTRAMUSCULAR | Status: AC
  Administered 2020-03-29: 15 mg via INTRAVENOUS

## 2020-03-29 MED ORDER — SODIUM CHLORIDE 0.9 % IV SOLN
INTRAVENOUS | Status: DC | PRN
  Administered 2020-03-29: 50 ug/min via INTRAVENOUS

## 2020-03-29 MED ORDER — KETOROLAC TROMETHAMINE 15 MG/ML IJ SOLN
7.5000 mg | Freq: Four times a day (QID) | INTRAMUSCULAR | Status: AC
  Administered 2020-03-29 – 2020-03-30 (×3): 7.5 mg via INTRAVENOUS
  Filled 2020-03-29 (×3): qty 1

## 2020-03-29 MED ORDER — ACETAMINOPHEN 325 MG PO TABS: 325.0000 mg | ORAL_TABLET | Freq: Four times a day (QID) | ORAL | Status: DC | PRN

## 2020-03-29 MED ORDER — BISACODYL 10 MG RE SUPP: 10.0000 mg | Freq: Every day | RECTAL | Status: DC | PRN

## 2020-03-29 MED ORDER — MIDAZOLAM HCL 2 MG/2ML IJ SOLN
INTRAMUSCULAR | Status: AC
  Filled 2020-03-29: qty 2

## 2020-03-29 MED ORDER — ONDANSETRON HCL 4 MG/2ML IJ SOLN
INTRAMUSCULAR | Status: DC | PRN
  Administered 2020-03-29: 4 mg via INTRAVENOUS

## 2020-03-29 MED ORDER — MOMETASONE FURO-FORMOTEROL FUM 200-5 MCG/ACT IN AERO
2.0000 | INHALATION_SPRAY | Freq: Two times a day (BID) | RESPIRATORY_TRACT | Status: DC
  Administered 2020-03-29 – 2020-04-01 (×6): 2 via RESPIRATORY_TRACT
  Filled 2020-03-29 (×2): qty 8.8

## 2020-03-29 MED ORDER — DIPHENHYDRAMINE HCL 12.5 MG/5ML PO ELIX: 12.5000 mg | ORAL_SOLUTION | ORAL | Status: DC | PRN

## 2020-03-29 MED ORDER — FAMOTIDINE 20 MG PO TABS
20.0000 mg | ORAL_TABLET | Freq: Once | ORAL | Status: AC
  Administered 2020-03-29: 20 mg via ORAL

## 2020-03-29 MED ORDER — MIDAZOLAM HCL 2 MG/2ML IJ SOLN
INTRAMUSCULAR | Status: DC | PRN
  Administered 2020-03-29: 1 mg via INTRAVENOUS

## 2020-03-29 MED ORDER — FLUOXETINE HCL 20 MG PO CAPS
40.0000 mg | ORAL_CAPSULE | Freq: Every day | ORAL | Status: DC
  Administered 2020-03-29 – 2020-03-31 (×3): 40 mg via ORAL
  Filled 2020-03-29 (×4): qty 2

## 2020-03-29 MED ORDER — ADULT MULTIVITAMIN W/MINERALS CH
1.0000 | ORAL_TABLET | Freq: Every day | ORAL | Status: DC
  Administered 2020-03-29 – 2020-03-31 (×3): 1 via ORAL
  Filled 2020-03-29 (×3): qty 1

## 2020-03-29 MED ORDER — BUPIVACAINE-EPINEPHRINE (PF) 0.5% -1:200000 IJ SOLN
INTRAMUSCULAR | Status: DC | PRN
  Administered 2020-03-29: 30 mL via PERINEURAL

## 2020-03-29 MED ORDER — ROSUVASTATIN CALCIUM 5 MG PO TABS
5.0000 mg | ORAL_TABLET | Freq: Every day | ORAL | Status: DC
  Administered 2020-03-29 – 2020-03-31 (×3): 5 mg via ORAL
  Filled 2020-03-29 (×3): qty 1

## 2020-03-29 SURGICAL SUPPLY — 56 items
BLADE SAGITTAL WIDE XTHICK NO (BLADE) ×3 IMPLANT
BLADE SURG SZ20 CARB STEEL (BLADE) ×3 IMPLANT
CANISTER SUCT 1200ML W/VALVE (MISCELLANEOUS) ×3 IMPLANT
CANISTER SUCT 3000ML PPV (MISCELLANEOUS) ×6 IMPLANT
CHLORAPREP W/TINT 26 (MISCELLANEOUS) ×6 IMPLANT
COVER WAND RF STERILE (DRAPES) ×3 IMPLANT
DRAPE 3/4 80X56 (DRAPES) ×3 IMPLANT
DRAPE INCISE IOBAN 66X60 STRL (DRAPES) ×6 IMPLANT
DRAPE SURG 17X11 SM STRL (DRAPES) ×6 IMPLANT
DRSG OPSITE POSTOP 4X10 (GAUZE/BANDAGES/DRESSINGS) ×3 IMPLANT
ELECT BLADE 6.5 EXT (BLADE) ×3 IMPLANT
ELECT CAUTERY BLADE 6.4 (BLADE) ×3 IMPLANT
GLOVE BIO SURGEON STRL SZ 6.5 (GLOVE) ×2 IMPLANT
GLOVE BIO SURGEON STRL SZ7 (GLOVE) ×3 IMPLANT
GLOVE BIO SURGEON STRL SZ7.5 (GLOVE) ×12 IMPLANT
GLOVE BIO SURGEON STRL SZ8 (GLOVE) ×12 IMPLANT
GLOVE BIO SURGEONS STRL SZ 6.5 (GLOVE) ×1
GLOVE BIOGEL PI IND STRL 8 (GLOVE) ×1 IMPLANT
GLOVE BIOGEL PI INDICATOR 8 (GLOVE) ×2
GLOVE INDICATOR 8.0 STRL GRN (GLOVE) ×3 IMPLANT
GOWN STRL REUS W/ TWL LRG LVL3 (GOWN DISPOSABLE) ×1 IMPLANT
GOWN STRL REUS W/ TWL XL LVL3 (GOWN DISPOSABLE) ×1 IMPLANT
GOWN STRL REUS W/TWL LRG LVL3 (GOWN DISPOSABLE) ×2
GOWN STRL REUS W/TWL XL LVL3 (GOWN DISPOSABLE) ×2
HIP SHELL ACETAB 3H 50MM (Hips) ×3 IMPLANT
HOOD PEEL AWAY FLYTE STAYCOOL (MISCELLANEOUS) ×12 IMPLANT
KIT TURNOVER KIT A (KITS) ×3 IMPLANT
LINER ACE G7 36 SZ D HIGH WALL (Liner) ×3 IMPLANT
MAT ABSORB  FLUID 56X50 GRAY (MISCELLANEOUS) ×4
MAT ABSORB FLUID 56X50 GRAY (MISCELLANEOUS) ×2 IMPLANT
NDL SAFETY ECLIPSE 18X1.5 (NEEDLE) ×2 IMPLANT
NEEDLE FILTER BLUNT 18X 1/2SAF (NEEDLE) ×2
NEEDLE FILTER BLUNT 18X1 1/2 (NEEDLE) ×1 IMPLANT
NEEDLE HYPO 18GX1.5 SHARP (NEEDLE) ×4
NEEDLE SPNL 20GX3.5 QUINCKE YW (NEEDLE) ×3 IMPLANT
PACK HIP PROSTHESIS (MISCELLANEOUS) ×3 IMPLANT
PIN STEINMAN 3/16 (PIN) ×3 IMPLANT
PULSAVAC PLUS IRRIG FAN TIP (DISPOSABLE) ×3
SHELL ACETAB HIP 3H 50MM (Hips) ×1 IMPLANT
SLEEVE SUCTION 125 (MISCELLANEOUS) ×3 IMPLANT
SOL .9 NS 3000ML IRR  AL (IV SOLUTION) ×2
SOL .9 NS 3000ML IRR UROMATIC (IV SOLUTION) ×1 IMPLANT
SPONGE LAP 18X18 RF (DISPOSABLE) ×3 IMPLANT
STAPLER SKIN PROX 35W (STAPLE) ×3 IMPLANT
STEM FEMORAL SZ12 140MM (Stem) ×6 IMPLANT
SUT TICRON 2-0 30IN 311381 (SUTURE) ×9 IMPLANT
SUT VIC AB 0 CT1 36 (SUTURE) ×3 IMPLANT
SUT VIC AB 1 CT1 36 (SUTURE) ×3 IMPLANT
SUT VIC AB 2-0 CT1 (SUTURE) ×9 IMPLANT
SYR 10ML LL (SYRINGE) ×3 IMPLANT
SYR 20ML LL LF (SYRINGE) ×3 IMPLANT
SYR 30ML LL (SYRINGE) ×6 IMPLANT
TAPE TRANSPORE STRL 2 31045 (GAUZE/BANDAGES/DRESSINGS) ×3 IMPLANT
TIP FAN IRRIG PULSAVAC PLUS (DISPOSABLE) ×1 IMPLANT
TRAY FOLEY MTR SLVR 16FR STAT (SET/KITS/TRAYS/PACK) IMPLANT
WATER STERILE IRR 1000ML POUR (IV SOLUTION) ×3 IMPLANT

## 2020-03-29 NOTE — Progress Notes (Signed)
PT bladder scanned for 364cc, I and O cathed

## 2020-03-29 NOTE — Op Note (Signed)
03/29/2020  9:53 AM  Patient:   Tammy Choi  Pre-Op Diagnosis:   Degenerative joint disease, right hip.  Post-Op Diagnosis:   Same.  Procedure:   Right total hip arthroplasty.  Surgeon:   Pascal Lux, MD  Assistant:   Cameron Proud, PA-C; Kirkland Hun, PA-S  Anesthesia:   Spinal  Findings:   As above.  Complications:   None  EBL:   200 cc  Fluids:   700 cc crystalloid  UOP:   None  TT:   None  Drains:   None  Closure:   Staples  Implants:   Biomet press-fit system with a #12 laterally offset Echo femoral stem, a 50 mm acetabular shell with an E-poly hi-wall liner, and a 36 mm ceramic head with a +3 mm neck.  Brief Clinical Note:   The patient is a 74 year old female with a long history of progressively worsening right hip pain. Her symptoms have progressed despite medications, activity modification, etc. Her history and examination are consistent with degenerative joint disease, confirmed by plain radiographs. She presents at this time for a right total hip arthroplasty.   Procedure:   The patient was brought into the operating room. After adequate spinal anesthesia was obtained, the patient was repositioned in the left lateral decubitus position and secured using a lateral hip positioner. The right hip and lower extremity were prepped with ChloroPrep solution before being draped sterilely. Preoperative antibiotics were administered. A timeout was performed to verify the appropriate surgical site before a standard posterior approach to the hip was made through an approximately 5-6 inch incision. The incision was carried down through the subcutaneous tissues to expose the gluteal fascia and proximal end of the iliotibial band. These structures were split the length of the incision and the Charnley self-retaining hip retractor placed. The bursal tissues were swept posteriorly to expose the short external rotators. The anterior border of the piriformis tendon was  identified and this plane developed down through the capsule to enter the joint. A flap of tissue was elevated off the posterior aspect of the femoral neck and greater trochanter and retracted posteriorly. This flap included the piriformis tendon, the short external rotators, and the posterior capsule. The soft tissues were elevated off the lateral aspect of the ilium and a large Steinmann pin placed bicortically.   With the right leg aligned over the left, a drill bit was placed into the greater trochanter parallel to the Steinmann pin and the distance between these two pins measured in order to optimize leg lengths postoperatively. The drill bit was removed and the hip dislocated. The piriformis fossa was debrided of soft tissues before the intramedullary canal was accessed through this point using a triple step reamer. The canal was reamed sequentially beginning with a #7 tapered reamer and progressing to a #12 tapered reamer. This provided excellent circumferential chatter. Using the appropriate guide, a femoral neck cut was made 10-12 mm above the lesser trochanter. The femoral head was removed.  Attention was directed to the acetabular side. The labrum was debrided circumferentially before the ligamentum teres was removed using a large curette. A line was drawn on the drapes corresponding to the native version of the acetabulum. This line was used as a guide while the acetabulum was reamed sequentially beginning with a 43 mm reamer and progressing to a 49 mm reamer. This provided excellent circumferential chatter. The 49 mm trial acetabulum was positioned and found to fit quite well. Therefore, the 50 mm acetabular  shell was selected and impacted into place with care taken to maintain the appropriate version. The trial high wall liner was inserted.  Attention was redirected to the femoral side. A box osteotome was used to establish version before the canal was broached sequentially beginning with a #8  broach and progressing to a #12 broach. This was left in place and several trial reductions performed using both a standard and laterally offset neck options, as well as the -6 mm, +0 mm, and +3 mm neck lengths. After removing the trial components, the "manhole cover" was placed into the apex of the acetabular shell and tightened securely. The permanent E-polyethylene hi-wall liner was impacted into the acetabular shell and its locking mechanism verified using a quarter-inch osteotome. Next, the #12 laterally offset femoral stem was impacted into place with care taken to maintain the appropriate version. A repeat trial reduction was performed using the +0 mm and +3 mm neck lengths. The +3 mm neck length demonstrated excellent stability both in extension and external rotation as well as with flexion to 90 and internal rotation beyond 70. It also was stable in the position of sleep. In addition, leg lengths appeared to be restored appropriately, both by reassessing the position of the right leg over the left, as well as by measuring the distance between the Steinmann pin and the drill bit. The 36 mm ceramic head with the +3 mm neck was impacted onto the stem of the femoral component. The Morse taper locking mechanism was verified using manual distraction before the head was relocated and placed through a range of motion with the findings as described above.  The wound was copiously irrigated with sterile saline solution via the jet lavage system before the peri-incisional and pericapsular tissues were injected with 30 cc of 0.5% Sensorcaine with epinephrine and 20 cc of Exparel diluted out to 60 cc with normal saline to help with postoperative analgesia. The posterior flap was reapproximated to the posterior aspect of the greater trochanter using #2 Tycron interrupted sutures placed through drill holes. Several additional #2 Tycron interrupted sutures were used to reinforce this layer of closure. The iliotibial  band was reapproximated using #1 Vicryl interrupted sutures before the gluteal fascia was closed using a running #1 Vicryl suture. At this point, 1 g of transexemic acid in 10 cc of normal saline was injected into the joint to help reduce postoperative bleeding. The subcutaneous tissues were closed in several layers using 2-0 Vicryl interrupted sutures before the skin was closed using staples. A sterile occlusive dressing was applied to the wound. The patient was then rolled back into the supine position on his/her hospital bed before being awakened and returned to the recovery room in satisfactory condition after tolerating the procedure well.

## 2020-03-29 NOTE — Evaluation (Signed)
Physical Therapy Evaluation Patient Details Name: Tammy Choi MRN: [id number] DOB: 07/21/1946 Today's Date: 03/29/2020   History of Present Illness  admitted for acute hospitalization status post R THA, posterior approach, WBAT (03/29/20)  Clinical Impression  Patient resting upon arrival to room; easily awakens to voice.  Oriented to basic information; follows commands and demonstrates excellent effort with therapeutic tasks during session.  Rates pain in R hip 2-3/10; minimally changed with movement and WBing.  Meds issued per RN during session as appropriate.  Demonstrates fair/good R hip strength (3-/5) and ROM (appropriate within THPs); fair isolated muscle strength and control, but generally guarded due to pain/soreness.  Currently requiring min assist for bed mobility; min assist for sit/stand, basic transfers and gait (5') with RW.  Demonstrates 3-point gait pattern, decreased stance time R LE; very short, guarded steps. Mod assist for walker position/management.  Generally anxious with mobility (preferring +2 for comfort), but very pleased with performance end of session.  Anticipate consistent progress towards goals throughout remaining hospitalization. Would benefit from skilled PT to address above deficits and promote optimal return to PLOF.; Recommend transition to HHPT upon discharge from acute hospitalization.     Follow Up Recommendations Home health PT    Equipment Recommendations  Rolling walker with 5" wheels;3in1 (PT)    Recommendations for Other Services       Precautions / Restrictions Precautions Precautions: Fall;Posterior Hip Restrictions Weight Bearing Restrictions: Yes RLE Weight Bearing: Weight bearing as tolerated Other Position/Activity Restrictions: WBing and THPs confirmed with Dr Roland Rack (03/29/20)      Mobility  Bed Mobility Overal bed mobility: Needs Assistance Bed Mobility: Supine to Sit     Supine to sit: Min assist         Transfers Overall transfer level: Needs assistance Equipment used: Rolling walker (2 wheeled) Transfers: Sit to/from Stand Sit to Stand: Min assist;+2 safety/equipment         General transfer comment: good hand placement; very slow and guarded, minimal WBing R LE with movement transition.  Preferred second person for comfort/confidence  Ambulation/Gait Ambulation/Gait assistance: Min assist;+2 safety/equipment Gait Distance (Feet): 5 Feet Assistive device: Rolling walker (2 wheeled)       General Gait Details: 3-point gait pattern, decreased stance time R LE; very short, guarded steps. Mod assist for walker position/management  Stairs            Wheelchair Mobility    Modified Rankin (Stroke Patients Only)       Balance Overall balance assessment: Needs assistance Sitting-balance support: No upper extremity supported;Feet supported Sitting balance-Leahy Scale: Good     Standing balance support: Bilateral upper extremity supported Standing balance-Leahy Scale: Fair                               Pertinent Vitals/Pain Pain Assessment: Faces Faces Pain Scale: Hurts a little bit Pain Location: R hip Pain Descriptors / Indicators: Aching;Guarding;Grimacing Pain Intervention(s): Limited activity within patient's tolerance;Monitored during session;Repositioned;RN gave pain meds during session;Patient requesting pain meds-RN notified    Home Living Family/patient expects to be discharged to:: Private residence Living Arrangements: Children Available Help at Discharge: Family;Available PRN/intermittently Type of Home: House Home Access: Stairs to enter Entrance Stairs-Rails: None Entrance Stairs-Number of Steps: 3 Home Layout: One level Home Equipment: Cane - single point      Prior Function Level of Independence: Independent         Comments: Indep with ADLs, household  and community mobilization without assist device; denies fall history;  no home O2.     Hand Dominance        Extremity/Trunk Assessment   Upper Extremity Assessment Upper Extremity Assessment: Overall WFL for tasks assessed    Lower Extremity Assessment Lower Extremity Assessment:  (R LE grossly 3-/5 throughout, limited by pain; sensation fully intact.  L LE grossly WFL)       Communication   Communication: No difficulties  Cognition Arousal/Alertness: Awake/alert Behavior During Therapy: WFL for tasks assessed/performed Overall Cognitive Status: Within Functional Limits for tasks assessed                                        General Comments      Exercises Other Exercises Other Exercises: Supine R LE therex, 1x10, act assist ROM: ankle pumps, quad sets, SAQs, heel slides, hip abduct/adduct.  Generally guarded throughout ranges, but good effort with tasks. Other Exercises: Verbally reviewed THPs, WBing recommendations; patient voiced understanding. Will continue to reinforce as needed.   Assessment/Plan    PT Assessment Patient needs continued PT services  PT Problem List Decreased strength;Decreased range of motion;Decreased activity tolerance;Decreased balance;Decreased mobility;Decreased coordination;Decreased cognition;Decreased knowledge of use of DME;Decreased safety awareness;Decreased knowledge of precautions;Cardiopulmonary status limiting activity;Impaired sensation;Pain       PT Treatment Interventions DME instruction;Gait training;Stair training;Functional mobility training;Therapeutic activities;Therapeutic exercise;Balance training;Patient/family education    PT Goals (Current goals can be found in the Care Plan section)  Acute Rehab PT Goals Patient Stated Goal: to do the best I can PT Goal Formulation: With patient Time For Goal Achievement: 04/12/20 Potential to Achieve Goals: Good    Frequency BID   Barriers to discharge        Co-evaluation               AM-PAC PT "6 Clicks" Mobility   Outcome Measure Help needed turning from your back to your side while in a flat bed without using bedrails?: A Little Help needed moving from lying on your back to sitting on the side of a flat bed without using bedrails?: A Little Help needed moving to and from a bed to a chair (including a wheelchair)?: A Little Help needed standing up from a chair using your arms (e.g., wheelchair or bedside chair)?: A Little Help needed to walk in hospital room?: A Little Help needed climbing 3-5 steps with a railing? : A Lot 6 Click Score: 17    End of Session Equipment Utilized During Treatment: Gait belt Activity Tolerance: Patient tolerated treatment well Patient left: in chair;with call bell/phone within reach;with chair alarm set Nurse Communication: Mobility status PT Visit Diagnosis: Muscle weakness (generalized) (M62.81);Pain;Other abnormalities of gait and mobility (R26.89) Pain - Right/Left: Right Pain - part of body: Hip    Time: 1525-1600 PT Time Calculation (min) (ACUTE ONLY): 35 min   Charges:   PT Evaluation $PT Eval Moderate Complexity: 1 Mod PT Treatments $Therapeutic Exercise: 8-22 mins $Therapeutic Activity: 8-22 mins       Namya Voges H. Owens Shark, PT, DPT, NCS 03/29/20, 5:20 PM [phone]

## 2020-03-29 NOTE — Progress Notes (Signed)
Patient received from PACU in room 149. Patient alert and oriented. Family at the bedside. Dressing clean, dry and intact. IV patient.

## 2020-03-29 NOTE — Anesthesia Procedure Notes (Signed)
Spinal  Patient location during procedure: OR Start time: 03/29/2020 7:31 AM End time: 03/29/2020 7:36 AM Staffing Performed: resident/CRNA  Preanesthetic Checklist Completed: patient identified, IV checked, site marked, risks and benefits discussed, surgical consent, monitors and equipment checked, pre-op evaluation and timeout performed Spinal Block Patient position: sitting Prep: ChloraPrep Patient monitoring: heart rate, continuous pulse ox and blood pressure Approach: midline Location: L3-4 Injection technique: single-shot Needle Needle type: Pencan  Needle gauge: 24 G Needle length: 10 cm Assessment Sensory level: T8

## 2020-03-29 NOTE — Anesthesia Preprocedure Evaluation (Signed)
Anesthesia Evaluation  Patient identified by MRN, date of birth, ID band Patient awake    Reviewed: Allergy & Precautions, H&P , NPO status , Patient's Chart, lab work & pertinent test results  History of Anesthesia Complications Negative for: history of anesthetic complications  Airway Mallampati: III  TM Distance: <3 FB Neck ROM: limited    Dental  (+) Upper Dentures, Lower Dentures   Pulmonary COPD, former smoker,    Pulmonary exam normal        Cardiovascular Exercise Tolerance: Good hypertension, (-) angina(-) Past MI and (-) DOE Normal cardiovascular exam     Neuro/Psych PSYCHIATRIC DISORDERS negative neurological ROS     GI/Hepatic negative GI ROS, Neg liver ROS, neg GERD  ,  Endo/Other  negative endocrine ROS  Renal/GU CRFRenal disease     Musculoskeletal  (+) Arthritis ,   Abdominal   Peds  Hematology negative hematology ROS (+)   Anesthesia Other Findings Past Medical History: No date: Arthritis 05/07/2018: Breast cancer (HCC)     Comment:  T1a, N0; ER/ PR positive, Her 2 neu not overexpressed.  No date: COPD (chronic obstructive pulmonary disease) (HCC)     Comment:  Not on home o2 No date: Depression No date: HTN (hypertension) No date: Hyperlipemia No date: Personal history of radiation therapy  Past Surgical History: No date: ABDOMINAL HYSTERECTOMY 04/30/2013: BREAST BIOPSY; Left     Comment:  neg core 05/07/2018: BREAST BIOPSY; Right     Comment:  right breast stereo x clip INVASIVE MAMMARY CARCINOMA 2019: BREAST LUMPECTOMY; Right 06/16/2018: BREAST LUMPECTOMY WITH SENTINEL LYMPH NODE BIOPSY; Right     Comment:  Procedure: BREAST LUMPECTOMY WITH SENTINEL LYMPH NODE               BX;  Surgeon: Robert Bellow, MD;  Location: [facility]               ORS;  Service: General;  Laterality: Right; 2014: COLONOSCOPY     Comment:  Dr Bary Castilla No date: Left Leg Surgery No date: TONSILLECTOMY      Reproductive/Obstetrics negative OB ROS                             Anesthesia Physical Anesthesia Plan  ASA: III  Anesthesia Plan: Spinal   Post-op Pain Management:    Induction:   PONV Risk Score and Plan:   Airway Management Planned: Natural Airway and Nasal Cannula  Additional Equipment:   Intra-op Plan:   Post-operative Plan:   Informed Consent: I have reviewed the patients History and Physical, chart, labs and discussed the procedure including the risks, benefits and alternatives for the proposed anesthesia with the patient or authorized representative who has indicated his/her understanding and acceptance.     Dental Advisory Given  Plan Discussed with: Anesthesiologist, CRNA and Surgeon  Anesthesia Plan Comments: (Patient reports no bleeding problems and no anticoagulant use.  Plan for spinal with backup GA  Patient consented for risks of anesthesia including but not limited to:  - adverse reactions to medications - damage to eyes, teeth, lips or other oral mucosa - nerve damage due to positioning  - risk of bleeding, infection and or nerve damage from spinal that could lead to paralysis - risk of headache or failed spinal - damage to teeth, lips or other oral mucosa - sore throat or hoarseness - damage to heart, brain, nerves, lungs, other parts of body or loss of life  Patient voiced understanding.)        Anesthesia Quick Evaluation

## 2020-03-29 NOTE — Transfer of Care (Signed)
Immediate Anesthesia Transfer of Care Note  Patient: Tammy Choi  Procedure(s) Performed: TOTAL HIP ARTHROPLASTY (Right Hip)  Patient Location: PACU  Anesthesia Type:MAC and Spinal  Level of Consciousness: awake, alert  and oriented  Airway & Oxygen Therapy: Patient Spontanous Breathing and Patient connected to face mask oxygen  Post-op Assessment: Report given to RN and Post -op Vital signs reviewed and stable  Post vital signs: Reviewed and stable  Last Vitals:  Vitals Value Taken Time  BP 115/65 03/29/20 0957  Temp 36.1 C 03/29/20 0953  Pulse 69 03/29/20 0959  Resp 17 03/29/20 0959  SpO2 100 % 03/29/20 0959  Vitals shown include unvalidated device data.  Last Pain:  Vitals:   03/29/20 0628  TempSrc: Temporal  PainSc: 7          Complications: No complications documented.

## 2020-03-29 NOTE — H&P (Signed)
History of Present Illness:  Tammy Choi is a 74 y.o. female that presents to clinic today for her preoperative history and evaluation. The patient is scheduled to undergo a right total hip arthroplasty on 03/29/20 by Dr. Roland Rack. Her pain began around 2 years ago. The pain is located lateral to the hip with radiation into the groin. She describes her pain as worse with walking or standing. She denies associated numbness or tingling.   The patient's symptoms have progressed to the point that they decrease her quality of life. The patient has previously undergone conservative treatment including NSAIDS and activity modification without adequate control of her symptoms.  Denies history of blood clots, denies significant cardiac history, denies history of lumbar surgery. Patient does report a distant anaphylactic reaction to penicillin with facial edema and difficulty breathing. Patient has had axillary lymph nodes removed on the right and is not to have blood pressure or IVs placed on that arm.  Past Medical, Surgical, Family, Social History, Allergies, Medications:   Past Medical History:  . Breast cancer (CMS-HCC) 2019  . Chickenpox  . COPD (chronic obstructive pulmonary disease) (CMS-HCC)  . Depression  . Hyperlipidemia  . Hypertension   Past Surgical History:  . ABDOMINAL HYSTERECTOMY  . Breast Biopsy Right 04/30/2016  . Breast Biopsy 05/07/2018  right breast stereo x clip INVASIVE MAMMARY CARCINOMA  . Breast Lumpectomy with Sentinel Lymph Node Biopsy Right 06/16/2018  Procedure: BREAST LUMPECTOMY WITH SENTINEL LYMPH NODE BX; Surgeon: Robert Bellow, MD; Location: [facility] ORS; Service: General; Laterality: Right;  . Left Leg Surgery Left  Cyst removal  . TONSILLECTOMY   Current Medications:  . acetaminophen (TYLENOL) 650 MG ER tablet Take 650 mg by mouth every 8 (eight) hours as needed  . albuterol (PROAIR HFA) 90 mcg/actuation inhaler INHALE 2 PUFFS BY MOUTH EVERY 6 HOURS  AS NEEDED FOR WHEEZING OR SHORTNESS OF BREATH  . anastrozole (ARIMIDEX) 1 mg tablet Take 1 tablet by mouth once daily  . budesonide-formoteroL (SYMBICORT) 160-4.5 mcg/actuation inhaler Inhale into the lungs Inhale 2 puffs into the lungs 2 (two) times daily.  . cholecalciferol (VITAMIN D3) 1000 unit tablet Take by mouth 2000 U Take 2,000 Units by mouth every other day.  Marland Kitchen FLUoxetine (PROZAC) 40 MG capsule Take 40 mg by mouth nightly  . fluticasone propionate (FLONASE) 50 mcg/actuation nasal spray by Nasal route Place 2 sprays into both nostrils as needed  . FUROsemide (LASIX) 20 MG tablet Take 0.5 tablets (10 mg total) by mouth daily as needed for edema.  . hydroCHLOROthiazide (HYDRODIURIL) 12.5 MG tablet Take 12.5 mg by mouth once daily  . lisinopriL (ZESTRIL) 40 MG tablet Take 40 mg by mouth once daily  . multivitamin with minerals tablet Take by mouth Take 1 tablet by mouth every other day  . rosuvastatin (CRESTOR) 5 MG tablet Take 5 mg by mouth once daily  . trimethoprim-polymyxin b (POLYTRIM) ophthalmic solution Apply to eye Place 1 drop into the left eye as needed   No current facility-administered medications for this visit.   Allergies:  . Penicillins Anaphylaxis and Other (See Comments)  Has patient had a PCN reaction causing immediate rash, facial/tongue/throat swelling, SOB or lightheadedness with hypotension: Yes Has patient had a PCN reaction causing severe rash involving mucus membranes or skin necrosis: No Has patient had a PCN reaction that required hospitalization No Has patient had a PCN reaction occurring within the last 10 years: No If all of the above answers are "NO", then may proceed  with Cephalosporin use.   Social History:   Socioeconomic History  . Marital status: Married  Spouse name: Not on file  . Number of children: Not on file  . Years of education: Not on file  . Highest education level: Not on file  Occupational History  . Not on file  Tobacco Use  .  Smoking status: Former Research scientist (life sciences)  . Smokeless tobacco: Never Used  Vaping Use  . Vaping Use: Never used  Substance and Sexual Activity  . Alcohol use: Not Currently  . Drug use: Never  . Sexual activity: Not on file  Other Topics Concern  . Not on file  Social History Narrative  . Not on file   Social Determinants of Health   Financial Resource Strain:  . Difficulty of Paying Living Expenses:  Food Insecurity:  . Worried About Charity fundraiser in the Last Year:  . Arboriculturist in the Last Year:  Transportation Needs:  . Film/video editor (Medical):  Marland Kitchen Lack of Transportation (Non-Medical):  Physical Activity:  . Days of Exercise per Week:  . Minutes of Exercise per Session:  Stress:  . Feeling of Stress :  Social Connections:  . Frequency of Communication with Friends and Family:  . Frequency of Social Gatherings with Friends and Family:  . Attends Religious Services:  . Active Member of Clubs or Organizations:  . Attends Archivist Meetings:  Marland Kitchen Marital Status:   Family History:  . Kidney disease Mother  . High blood pressure (Hypertension) Mother  . Heart disease Father   Review of Systems:  A 10+ ROS was performed, reviewed, and the pertinent orthopaedic findings are documented in the HPI.   Physical Examination:  BP 120/70  Ht 160 cm (5\' 3" )  Wt 76.5 kg (168 lb 9.6 oz)  BMI 29.87 kg/m   Patient is a well-developed, well-nourished female in no acute distress. Patient has normal mood and affect. Patient is alert and oriented to person, place, and time.   HEENT: Atraumatic, normocephalic. Pupils equal and reactive to light. Extraocular motion intact. Noninjected sclera.  Cardiovascular: Regular rate and rhythm, with no murmurs, rubs, or gallops. Distal pulses palpable.  Respiratory: Lungs clear to auscultation bilaterally.   Right Lower Extremities: Examination of the right lower extremities reveals no bony abnormality, no edema, and no  ecchymosis. The patient does have moderate to severe pain with attempted hip flexion or extension. The patient does have limited range of motion to the right hip, she is able to internally rotate 15 degrees, external rotation 20 degrees. The patient has significantly limited abduction of the right hip. The patient has severe anterior hip joint tenderness. Pain with all attempted range of motion to the right hip. The patient is non tender along the greater trochanter region. The patient has a negative Bevelyn Buckles' test bilaterally. There is normal skin warmth. There is normal capillary refill bilaterally. Sensation is intact over the saphenous, lateral cutaneous, superficial fibular, and deep fibular nerve distributions.  X-rays: Previous radiographs were reviewed of the right hip and revealed complete loss of femoral acetabular joint space with bone-on-bone contact and subchondral sclerosis of the bone noted. Significant osteophyte formation noted at the acetabulum and femoral head. No fractures noted.  Impression:  1. Primary osteoarthritis of right hip   Plan:  The patient has end-stage degenerative changes of the right hip. It was explained to the patient that the condition is progressive in nature. Having failed conservative treatment, the patient has  elected to proceed with a total joint arthroplasty. The patient will undergo a total joint arthroplasty with Dr. Roland Rack. The risks of surgery, including blood clot and infection, were discussed with the patient. Measures to reduce these risks, including the use of anticoagulation, perioperative antibiotics, and early ambulation were discussed. The importance of postoperative physical therapy was discussed with the patient. The patient elects to proceed with surgery. The patient is instructed to stop all blood thinners prior to surgery.   H&P reviewed and patient re-examined. No changes.

## 2020-03-30 LAB — CBC
HCT: 26.8 % — ABNORMAL LOW (ref 36.0–46.0)
Hemoglobin: 9.1 g/dL — ABNORMAL LOW (ref 12.0–15.0)
MCH: 29.6 pg (ref 26.0–34.0)
MCHC: 34 g/dL (ref 30.0–36.0)
MCV: 87.3 fL (ref 80.0–100.0)
Platelets: 176 10*3/uL (ref 150–400)
RBC: 3.07 MIL/uL — ABNORMAL LOW (ref 3.87–5.11)
RDW: 15.1 % (ref 11.5–15.5)
WBC: 7.6 10*3/uL (ref 4.0–10.5)
nRBC: 0 % (ref 0.0–0.2)

## 2020-03-30 LAB — BASIC METABOLIC PANEL
Anion gap: 5 (ref 5–15)
BUN: 39 mg/dL — ABNORMAL HIGH (ref 8–23)
CO2: 25 mmol/L (ref 22–32)
Calcium: 8.4 mg/dL — ABNORMAL LOW (ref 8.9–10.3)
Chloride: 104 mmol/L (ref 98–111)
Creatinine, Ser: 1.33 mg/dL — ABNORMAL HIGH (ref 0.44–1.00)
GFR calc Af Amer: 46 mL/min — ABNORMAL LOW (ref >60)
GFR calc non Af Amer: 39 mL/min — ABNORMAL LOW (ref >60)
Glucose, Bld: 127 mg/dL — ABNORMAL HIGH (ref 70–99)
Potassium: 4.9 mmol/L (ref 3.5–5.1)
Sodium: 134 mmol/L — ABNORMAL LOW (ref 135–145)

## 2020-03-30 NOTE — Anesthesia Postprocedure Evaluation (Signed)
Anesthesia Post Note  Patient: Tammy Choi  Procedure(s) Performed: TOTAL HIP ARTHROPLASTY (Right Hip)  Patient location during evaluation: Nursing Unit Anesthesia Type: Spinal Level of consciousness: oriented and awake and alert Pain management: pain level controlled Vital Signs Assessment: post-procedure vital signs reviewed and stable Respiratory status: spontaneous breathing Cardiovascular status: blood pressure returned to baseline and stable Postop Assessment: no headache, no backache, no apparent nausea or vomiting and patient able to bend at knees Anesthetic complications: no   No complications documented.   Last Vitals:  Vitals:   03/30/20 0513 03/30/20 0729  BP: (!) 96/50 (!) 106/50  Pulse: 77 72  Resp: 18 16  Temp: 36.8 C 36.8 C  SpO2: 94% 95%    Last Pain:  Vitals:   03/30/20 0729  TempSrc: Oral  PainSc:                  Tammy Choi

## 2020-03-30 NOTE — TOC Progression Note (Signed)
Transition of Care [facility]) - Progression Note    Patient Details  Name: Tammy Choi MRN: [id number] Date of Birth: 1946-04-28  Transition of Care [facility]) CM/SW [place], RN Phone Number: 03/30/2020, 2:46 PM  Clinical Narrative:     Met with the patient to discuss DC needs and plan, She is set up with [facility] for home health and will be staying with family at [place], I notified theresa with [facility], She needs a RW and I notified Brad with [employer], she plans to DC tomorrow She stated that she has transportation and is up to date with her PCP and she can afford her medicaitons No additional needs       Expected Discharge Plan and Services                                                 Social Determinants of Health (SDOH) Interventions    Readmission Risk Interventions No flowsheet data found.

## 2020-03-30 NOTE — TOC Benefit Eligibility Note (Signed)
Transition of Care [facility]) Benefit Eligibility Note    Patient Details  Name: Tammy Choi MRN: [id number] Date of Birth: Mar 15, 1946   Medication/Dose: Enoxaparin 40 mg daily x 14 days  Covered?: Yes (Generic - Enoxaparin)  Tier: Other (Tier 4)  Prescription Coverage Preferred Pharmacy: [facility].  [phone].  (I asked about [facility] but she said it may reject it)  Spoke with Person/Company/Phone Number:: Marcha Dutton, [place]: $90.00 through [place] [phone])        Additional Notes: Lovenox not on [place] Phone Number: 03/30/2020, 2:09 PM

## 2020-03-30 NOTE — Progress Notes (Signed)
  Subjective: 1 Day Post-Op Procedure(s) (LRB): TOTAL HIP ARTHROPLASTY (Right) Patient reports pain as mild.   Patient is well, and has had no acute complaints or problems PT and care management to assist with discharge planning. Negative for chest pain and shortness of breath Fever: no Gastrointestinal:Negative for nausea and vomiting  Objective: Vital signs in last 24 hours: Temp:  [97 F (36.1 C)-98.3 F (36.8 C)] 98.3 F (36.8 C) (06/30 0729) Pulse Rate:  [71-83] 72 (06/30 0729) Resp:  [16-20] 16 (06/30 0729) BP: (87-115)/(48-67) 106/50 (06/30 0729) SpO2:  [90 %-100 %] 95 % (06/30 0729)  Intake/Output from previous day:  Intake/Output Summary (Last 24 hours) at 03/30/2020 0750 Last data filed at 03/30/2020 0500 Gross per 24 hour  Intake 1910 ml  Output 550 ml  Net 1360 ml    Intake/Output this shift: No intake/output data recorded.  Labs: Recent Labs    03/30/20 0410  HGB 9.1*   Recent Labs    03/30/20 0410  WBC 7.6  RBC 3.07*  HCT 26.8*  PLT 176   Recent Labs    03/30/20 0410  NA 134*  K 4.9  CL 104  CO2 25  BUN 39*  CREATININE 1.33*  GLUCOSE 127*  CALCIUM 8.4*   No results for input(s): LABPT, INR in the last 72 hours.   EXAM General - Patient is Alert, Appropriate and Oriented Extremity - ABD soft Sensation intact distally Intact pulses distally Dorsiflexion/Plantar flexion intact Incision: scant drainage No cellulitis present Dressing/Incision - blood tinged drainage to the distal aspect of the incision. Motor Function - intact, moving foot and toes well on exam.   Past Medical History:  Diagnosis Date  . Arthritis   . Breast cancer ([place]) 05/07/2018   T1a, N0; ER/ PR positive, Her 2 neu not overexpressed.   Marland Kitchen COPD (chronic obstructive pulmonary disease) (HCC)    Not on home o2  . Depression   . HTN (hypertension)   . Hyperlipemia   . Personal history of radiation therapy     Assessment/Plan: 1 Day Post-Op Procedure(s)  (LRB): TOTAL HIP ARTHROPLASTY (Right) Active Problems:   Status post total hip replacement, right  Estimated body mass index is 31.18 kg/m as calculated from the following:   Height as of 03/21/20: 5\' 3"  (1.6 m).   Weight as of 03/21/20: 79.8 kg. Advance diet Up with therapy D/C IV fluids when tolerating po intake.  Labs reviewed this AM, Hg 9.1, WBC 7.6.  CBC and BMP ordered for tomorrow morning. BUN 39, Cr 1.33, encouraged increased oral intake today. Encouraged incentive spirometer use. Continue with PT today. Begin working on BM. Plan for possible d/c home tomorrow pending progress with PT.  DVT Prophylaxis - Lovenox, Foot Pumps and TED hose Weight-Bearing as tolerated to right leg  J. Cameron Proud, PA-C [facility] Orthopaedic Surgery 03/30/2020, 7:50 AM

## 2020-03-30 NOTE — Progress Notes (Signed)
Physical Therapy Treatment Patient Details Name: Tammy Choi MRN: [id number] DOB: 10/19/1945 Today's Date: 03/30/2020    History of Present Illness admitted for acute hospitalization status post R THA, posterior approach, WBAT (03/29/20)    PT Comments    Pt was long sitting in bed upon arriving. Reports 2/10 pain at rest that elevated to 3/10 in wt bearing ambulation. Pt is A and O x 4 and reports living with supportive daughter. Daughter will be home to assist pt at DC 24/7. Pt was able to recall 2/3 hip precautions and able to adhere throughout.She was able to exit bed with supervision and stand with CGA to RW. Ambulated to RN station and back to room. Reports fatigue but vitals were stable. Pt just cleared for diet advancement. PT will return later this afternoon to continue to progress as able. Therapist issued HEP handout and pt requested to wait to perform till PM session. Overall progressing well towards goals. Will need HHPT at DC to continue to address strength, endurance, and safe functional mobility deficits. Pt was seated in recliner with call bell in reach, chair alarm in place, and ice pack applied to R hip. RN aware of pt's abilities.      Follow Up Recommendations  Home health PT     Equipment Recommendations  Rolling walker with 5" wheels;3in1 (PT)    Recommendations for Other Services       Precautions / Restrictions Precautions Precautions: Fall;Posterior Hip Precaution Booklet Issued: Yes (comment) Restrictions Weight Bearing Restrictions: Yes RLE Weight Bearing: Weight bearing as tolerated    Mobility  Bed Mobility Overal bed mobility: Needs Assistance Bed Mobility: Supine to Sit     Supine to sit: Supervision     General bed mobility comments: Pt was able to exit L side of bed with supervision. HOB elevated. pt did not use bedrails.   Transfers Overall transfer level: Modified independent Equipment used: Rolling walker (2 wheeled) Transfers:  Sit to/from Stand Sit to Stand: Min guard         General transfer comment: CGA for safety. No physical lifting assistance required.  Ambulation/Gait Ambulation/Gait assistance: Supervision Gait Distance (Feet): 100 Feet Assistive device: Rolling walker (2 wheeled) Gait Pattern/deviations: Step-to pattern;Antalgic Gait velocity: decreased   General Gait Details: pt demonstrated safe steady gait kinematics without LOB. Does fatigue quickly and requested to return to troom after ambulation to RN station and back.   Stairs             Wheelchair Mobility    Modified Rankin (Stroke Patients Only)       Balance Overall balance assessment: Needs assistance Sitting-balance support: No upper extremity supported;Feet supported Sitting balance-Leahy Scale: Good     Standing balance support: Bilateral upper extremity supported Standing balance-Leahy Scale: Good Standing balance comment: no LOB. heavily relies on RW 2/2 to pain                            Cognition Arousal/Alertness: Awake/alert Behavior During Therapy: WFL for tasks assessed/performed Overall Cognitive Status: Within Functional Limits for tasks assessed                                 General Comments: Pt is A and O x 4 and cooperative and pleasant throughout.      Exercises      General Comments  Pertinent Vitals/Pain Pain Assessment: 0-10 Pain Score: 3  Faces Pain Scale: Hurts little more Pain Location: R hip Pain Descriptors / Indicators: Aching;Guarding;Grimacing Pain Intervention(s): Limited activity within patient's tolerance;Monitored during session;Premedicated before session;Repositioned;Ice applied    Home Living                      Prior Function            PT Goals (current goals can now be found in the care plan section) Acute Rehab PT Goals Patient Stated Goal: to do the best I can Progress towards PT goals: Progressing toward  goals    Frequency    BID      PT Plan Current plan remains appropriate    Co-evaluation              AM-PAC PT "6 Clicks" Mobility   Outcome Measure  Help needed turning from your back to your side while in a flat bed without using bedrails?: A Little Help needed moving from lying on your back to sitting on the side of a flat bed without using bedrails?: A Little Help needed moving to and from a bed to a chair (including a wheelchair)?: A Little Help needed standing up from a chair using your arms (e.g., wheelchair or bedside chair)?: A Little Help needed to walk in hospital room?: A Little Help needed climbing 3-5 steps with a railing? : A Lot 6 Click Score: 17    End of Session Equipment Utilized During Treatment: Gait belt Activity Tolerance: Patient tolerated treatment well Patient left: in chair;with call bell/phone within reach;with chair alarm set Nurse Communication: Mobility status PT Visit Diagnosis: Muscle weakness (generalized) (M62.81);Pain;Other abnormalities of gait and mobility (R26.89) Pain - Right/Left: Right Pain - part of body: Hip     Time: 0940-1007 PT Time Calculation (min) (ACUTE ONLY): 27 min  Charges:  $Gait Training: 8-22 mins $Therapeutic Activity: 8-22 mins                    Julaine Fusi PTA 03/30/20, 10:21 AM

## 2020-03-30 NOTE — Progress Notes (Signed)
Physical Therapy Treatment Patient Details Name: Tammy Choi MRN: [id number] DOB: 03-27-1946 Today's Date: 03/30/2020    History of Present Illness admitted for acute hospitalization status post R THA, posterior approach, WBAT (03/29/20)    PT Comments    Pt was seated in recliner upon arriving. RN in room assisting with IV while pt's gown was changed. Pt reports severe fatigue but agrees to PT session. Agreeable to OOB activity requesting to return to bed afterwards. She stood from recliner with supervision and ambulated 120 ft without LOB or unsteadiness. Pt does report fatigue. HR 95 sao2 96% on room air. She returned to room and urinated 400 mL prior to returning to bed. Reviewed there ex handout however pt requested not to perform at this time 2/2 to fatigue. Overall pt is progressing towards PT goals. Will trial stair training in the AM while daughter is present.    Follow Up Recommendations  Home health PT     Equipment Recommendations  Rolling walker with 5" wheels;3in1 (PT)    Recommendations for Other Services       Precautions / Restrictions Precautions Precautions: Fall;Posterior Hip Precaution Booklet Issued: Yes (comment) Restrictions Weight Bearing Restrictions: Yes RLE Weight Bearing: Weight bearing as tolerated    Mobility  Bed Mobility Overal bed mobility: Needs Assistance Bed Mobility: Sit to Supine       Sit to supine: Min guard   General bed mobility comments: Pt required support to safely be able to progress RLE back into bed.  Transfers Overall transfer level: Modified independent Equipment used: Rolling walker (2 wheeled) Transfers: Sit to/from Stand Sit to Stand: Supervision         General transfer comment: Pt demonstrated safe ability to STS from BSC/recliner without physical assistance. vcs only for adhering to hip precautions  Ambulation/Gait Ambulation/Gait assistance: Supervision Gait Distance (Feet): 120 Feet Assistive  device: Rolling walker (2 wheeled) Gait Pattern/deviations: Step-to pattern;Antalgic Gait velocity: decreased   General Gait Details: no LOB or unsteadiness with increased distance   Stairs Stairs:  (will trial stairs next session)           Wheelchair Mobility    Modified Rankin (Stroke Patients Only)       Balance Overall balance assessment: Needs assistance Sitting-balance support: No upper extremity supported;Feet supported Sitting balance-Leahy Scale: Good Sitting balance - Comments: no LOB in sitting   Standing balance support: Bilateral upper extremity supported Standing balance-Leahy Scale: Good Standing balance comment: pt demonstrated good balance without LOB. even let go of RW to wipe after urinating.                            Cognition Arousal/Alertness: Awake/alert Behavior During Therapy: WFL for tasks assessed/performed Overall Cognitive Status: Within Functional Limits for tasks assessed                                 General Comments: Pt is A and O x 4. Cooperative and pleasant      Exercises      General Comments        Pertinent Vitals/Pain Pain Assessment: 0-10 Pain Score: 2  Faces Pain Scale: Hurts a little bit Pain Location: R hip Pain Descriptors / Indicators: Aching;Guarding;Grimacing Pain Intervention(s): Limited activity within patient's tolerance;Monitored during session;Premedicated before session;Repositioned    Home Living  Prior Function            PT Goals (current goals can now be found in the care plan section) Acute Rehab PT Goals Patient Stated Goal: " I want to get better so I can go home." Progress towards PT goals: Progressing toward goals    Frequency    BID      PT Plan Current plan remains appropriate    Co-evaluation              AM-PAC PT "6 Clicks" Mobility   Outcome Measure  Help needed turning from your back to your side while  in a flat bed without using bedrails?: A Little Help needed moving from lying on your back to sitting on the side of a flat bed without using bedrails?: A Little Help needed moving to and from a bed to a chair (including a wheelchair)?: A Little Help needed standing up from a chair using your arms (e.g., wheelchair or bedside chair)?: A Little Help needed to walk in hospital room?: A Little Help needed climbing 3-5 steps with a railing? : A Lot 6 Click Score: 17    End of Session Equipment Utilized During Treatment: Gait belt Activity Tolerance: Patient tolerated treatment well Patient left: in bed;with call bell/phone within reach;with bed alarm set;with nursing/sitter in room;with SCD's reapplied Nurse Communication: Mobility status PT Visit Diagnosis: Muscle weakness (generalized) (M62.81);Pain;Other abnormalities of gait and mobility (R26.89) Pain - Right/Left: Right Pain - part of body: Hip     Time: 1315-1340 PT Time Calculation (min) (ACUTE ONLY): 25 min  Charges:  $Gait Training: 8-22 mins $Therapeutic Activity: 8-22 mins                     Julaine Fusi PTA 03/30/20, 2:07 PM

## 2020-03-30 NOTE — Plan of Care (Signed)
  Problem: Education: Goal: Knowledge of General Education information will improve Description: Including pain rating scale, medication(s)/side effects and non-pharmacologic comfort measures Outcome: Progressing   Problem: Clinical Measurements: Goal: Ability to maintain clinical measurements within normal limits will improve Outcome: Progressing Goal: Will remain free from infection Outcome: Progressing Goal: Diagnostic test results will improve Outcome: Progressing Goal: Respiratory complications will improve Outcome: Progressing Goal: Cardiovascular complication will be avoided Outcome: Progressing   Problem: Activity: Goal: Risk for activity intolerance will decrease Outcome: Progressing   Problem: Coping: Goal: Level of anxiety will decrease Outcome: Progressing   Problem: Pain Managment: Goal: General experience of comfort will improve Outcome: Progressing   

## 2020-03-31 LAB — BASIC METABOLIC PANEL
Anion gap: 8 (ref 5–15)
BUN: 27 mg/dL — ABNORMAL HIGH (ref 8–23)
CO2: 25 mmol/L (ref 22–32)
Calcium: 8.5 mg/dL — ABNORMAL LOW (ref 8.9–10.3)
Chloride: 103 mmol/L (ref 98–111)
Creatinine, Ser: 1.13 mg/dL — ABNORMAL HIGH (ref 0.44–1.00)
GFR calc Af Amer: 55 mL/min — ABNORMAL LOW (ref >60)
GFR calc non Af Amer: 48 mL/min — ABNORMAL LOW (ref >60)
Glucose, Bld: 103 mg/dL — ABNORMAL HIGH (ref 70–99)
Potassium: 4.9 mmol/L (ref 3.5–5.1)
Sodium: 136 mmol/L (ref 135–145)

## 2020-03-31 LAB — CBC
HCT: 28.5 % — ABNORMAL LOW (ref 36.0–46.0)
Hemoglobin: 9.4 g/dL — ABNORMAL LOW (ref 12.0–15.0)
MCH: 28.8 pg (ref 26.0–34.0)
MCHC: 33 g/dL (ref 30.0–36.0)
MCV: 87.4 fL (ref 80.0–100.0)
Platelets: 190 10*3/uL (ref 150–400)
RBC: 3.26 MIL/uL — ABNORMAL LOW (ref 3.87–5.11)
RDW: 15.3 % (ref 11.5–15.5)
WBC: 7.6 10*3/uL (ref 4.0–10.5)
nRBC: 0 % (ref 0.0–0.2)

## 2020-03-31 MED ORDER — ENOXAPARIN SODIUM 40 MG/0.4ML ~~LOC~~ SOLN: 40.0000 mg | SUBCUTANEOUS | 0 refills | Status: DC

## 2020-03-31 MED ORDER — TRAMADOL HCL 50 MG PO TABS: 50.0000 mg | ORAL_TABLET | Freq: Four times a day (QID) | ORAL | 0 refills | Status: DC | PRN

## 2020-03-31 MED ORDER — OXYCODONE HCL 5 MG PO TABS: 5.0000 mg | ORAL_TABLET | ORAL | 0 refills | Status: DC | PRN

## 2020-03-31 NOTE — Discharge Instructions (Signed)
Instructions after Total Hip Replacement     J. Jeffrey Poggi, M.D.  J. Lance Salih Williamson, PA-C     Dept. of Orthopaedics & Sports Medicine  [facility]  [address]  [place], [place]  [address]  Phone: [phone]   Fax: [phone]    DIET: . Drink plenty of non-alcoholic fluids. . Resume your normal diet. Include foods high in fiber.  ACTIVITY:  . You may use crutches or a walker with weight-bearing as tolerated, unless instructed otherwise. . You may be weaned off of the walker or crutches by your Physical Therapist.  . Do NOT reach below the level of your knees or cross your legs until allowed.    . Continue doing gentle exercises. Exercising will reduce the pain and swelling, increase motion, and prevent muscle weakness.   . Please continue to use the TED compression stockings for 6 weeks. You may remove the stockings at night, but should reapply them in the morning. . Do not drive or operate any equipment until instructed.  WOUND CARE:  . Continue to use ice packs periodically to reduce pain and swelling. . Keep the incision clean and dry. . You may bathe or shower after the staples are removed at the first office visit following surgery.  MEDICATIONS: . You may resume your regular medications. . Please take the pain medication as prescribed on the medication. . Do not take pain medication on an empty stomach. . You have been given a prescription for a blood thinner to prevent blood clots. Please take the medication as instructed. (NOTE: After completing a 2 week course of Lovenox, take one Enteric-coated aspirin once a day.) . Pain medications and iron supplements can cause constipation. Use a stool softener (Senokot or Colace) on a daily basis and a laxative (dulcolax or miralax) as needed. . Do not drive or drink alcoholic beverages when taking pain medications.  CALL THE OFFICE FOR: . Temperature above 101 degrees . Excessive bleeding or drainage on the  dressing. . Excessive swelling, coldness, or paleness of the toes. . Persistent nausea and vomiting.  FOLLOW-UP:  . You should have an appointment to return to the office in 2 weeks after surgery. . Arrangements have been made for continuation of Physical Therapy (either home therapy or outpatient therapy).  

## 2020-03-31 NOTE — Progress Notes (Signed)
Physical Therapy Treatment Patient Details Name: Tammy Choi MRN: [id number] DOB: 01-Jun-1946 Today's Date: 03/31/2020    History of Present Illness admitted for acute hospitalization status post R THA, posterior approach, WBAT (03/29/20)    PT Comments    Pt was seated in recliner with supportive daughter at bedside. Pt reports having more severe pain this date but continues to be cooperative and pleasant. Gets frustrated and states she does not feel she is doing well. Therapist discussed that it is not unusual for POD 2 pain and not to be discouraged. She was able to stand to RW without assistance, ambulated 100 ft with RW with CGA for safety. No LOB or unsteadiness. Pt fatigues but HR/sao2 stable throughout. Returned to room and performed HEP. She demonstrated good performance of exercises with minimal assistance. Both daughter/patient feel confident she will be safe to return home tomorrow. Will try stairs in AM and continue to progress mobility during PM session later today. Pt will benefit from HHPT at DC to address strength, balance, and safe functional mobility deficits.      Follow Up Recommendations  Home health PT     Equipment Recommendations  Rolling walker with 5" wheels;3in1 (PT)    Recommendations for Other Services       Precautions / Restrictions Precautions Precautions: Fall;Posterior Hip Precaution Booklet Issued: Yes (comment) Restrictions Weight Bearing Restrictions: Yes RLE Weight Bearing: Weight bearing as tolerated Other Position/Activity Restrictions: WBing and THPs confirmed with Dr Roland Rack (03/29/20)    Mobility  Bed Mobility               General bed mobility comments: Pt was seated in recliner pre/post session  Transfers Overall transfer level: Modified independent Equipment used: Rolling walker (2 wheeled) Transfers: Sit to/from Stand Sit to Stand: Supervision         General transfer comment: pt performed STS from recliner without  physical assistance. Only required cues to adhere to hip precautions  Ambulation/Gait Ambulation/Gait assistance: Supervision;Min guard Gait Distance (Feet): 100 Feet Assistive device: Rolling walker (2 wheeled) Gait Pattern/deviations: Step-to pattern;Step-through pattern;Antalgic;Decreased stance time - right Gait velocity: decreased   General Gait Details: No LOB or unsteadiness however pt limited by pain.    Stairs Stairs:  (will trial stairs in future sessions)           Wheelchair Mobility    Modified Rankin (Stroke Patients Only)       Balance Overall balance assessment: Needs assistance Sitting-balance support: No upper extremity supported;Feet supported Sitting balance-Leahy Scale: Good Sitting balance - Comments: no LOB in sitting   Standing balance support: Bilateral upper extremity supported Standing balance-Leahy Scale: Good Standing balance comment: no LOB during standing activity in static and dynamic                             Cognition Arousal/Alertness: Awake/alert Behavior During Therapy: WFL for tasks assessed/performed Overall Cognitive Status: Within Functional Limits for tasks assessed                                 General Comments: Pt is A and O x 4. Cooperative and pleasant. does have significantly more pain today.      Exercises Total Joint Exercises Ankle Circles/Pumps: AROM;Both;10 reps Quad Sets: AROM;10 reps;Right Gluteal Sets: AROM;10 reps Towel Squeeze: AROM;10 reps Short Arc Quad: AROM;10 reps Heel Slides: Other (comment) (did not  perform in chair) Hip ABduction/ADduction: AAROM;10 reps    General Comments        Pertinent Vitals/Pain Pain Assessment: 0-10 Pain Score: 6  Faces Pain Scale: Hurts little more Pain Location: R hip Pain Descriptors / Indicators: Aching;Guarding;Grimacing Pain Intervention(s): Limited activity within patient's tolerance;Monitored during session;Premedicated  before session;Repositioned    Home Living                      Prior Function            PT Goals (current goals can now be found in the care plan section) Acute Rehab PT Goals Patient Stated Goal: " I want to get better so I can go home." Progress towards PT goals: Progressing toward goals    Frequency    BID      PT Plan Current plan remains appropriate    Co-evaluation              AM-PAC PT "6 Clicks" Mobility   Outcome Measure  Help needed turning from your back to your side while in a flat bed without using bedrails?: A Little Help needed moving from lying on your back to sitting on the side of a flat bed without using bedrails?: A Little Help needed moving to and from a bed to a chair (including a wheelchair)?: A Little Help needed standing up from a chair using your arms (e.g., wheelchair or bedside chair)?: A Little Help needed to walk in hospital room?: A Little Help needed climbing 3-5 steps with a railing? : A Lot 6 Click Score: 17    End of Session Equipment Utilized During Treatment: Gait belt Activity Tolerance: Patient tolerated treatment well Patient left: in chair;with call bell/phone within reach;with chair alarm set;with family/visitor present Nurse Communication: Mobility status PT Visit Diagnosis: Muscle weakness (generalized) (M62.81);Pain;Other abnormalities of gait and mobility (R26.89) Pain - Right/Left: Right Pain - part of body: Hip     Time: 1135-1200 PT Time Calculation (min) (ACUTE ONLY): 25 min  Charges:  $Gait Training: 8-22 mins $Therapeutic Exercise: 8-22 mins                     Julaine Fusi PTA 03/31/20, 12:30 PM

## 2020-03-31 NOTE — Progress Notes (Signed)
  Subjective: 2 Days Post-Op Procedure(s) (LRB): TOTAL HIP ARTHROPLASTY (Right) Patient reports pain as moderate.   Patient is well, and has had no acute complaints or problems Plan is for discharge home after completion of PT. Negative for chest pain and shortness of breath Fever: 99.8 last night. Gastrointestinal:Negative for nausea and vomiting  Objective: Vital signs in last 24 hours: Temp:  [97.8 F (36.6 C)-99.8 F (37.7 C)] 99.8 F (37.7 C) (06/30 2214) Pulse Rate:  [72-90] 90 (06/30 2214) Resp:  [16-19] 19 (06/30 2214) BP: (106-126)/(50-73) 126/50 (06/30 2214) SpO2:  [95 %-100 %] 95 % (06/30 2214)  Intake/Output from previous day:  Intake/Output Summary (Last 24 hours) at 03/31/2020 0718 Last data filed at 03/30/2020 1900 Gross per 24 hour  Intake 1675.4 ml  Output --  Net 1675.4 ml    Intake/Output this shift: No intake/output data recorded.  Labs: Recent Labs    03/30/20 0410 03/31/20 0510  HGB 9.1* 9.4*   Recent Labs    03/30/20 0410 03/31/20 0510  WBC 7.6 7.6  RBC 3.07* 3.26*  HCT 26.8* 28.5*  PLT 176 190   Recent Labs    03/30/20 0410 03/31/20 0510  NA 134* 136  K 4.9 4.9  CL 104 103  CO2 25 25  BUN 39* 27*  CREATININE 1.33* 1.13*  GLUCOSE 127* 103*  CALCIUM 8.4* 8.5*   No results for input(s): LABPT, INR in the last 72 hours.   EXAM General - Patient is Alert, Appropriate and Oriented Extremity - ABD soft Sensation intact distally Intact pulses distally Dorsiflexion/Plantar flexion intact Incision: scant drainage No cellulitis present Dressing/Incision - blood tinged drainage to the distal aspect of the incision. Motor Function - intact, moving foot and toes well on exam.  Negative Homans to bilateral legs.  Past Medical History:  Diagnosis Date  . Arthritis   . Breast cancer ([place]) 05/07/2018   T1a, N0; ER/ PR positive, Her 2 neu not overexpressed.   Marland Kitchen COPD (chronic obstructive pulmonary disease) (HCC)    Not on home o2  .  Depression   . HTN (hypertension)   . Hyperlipemia   . Personal history of radiation therapy     Assessment/Plan: 2 Days Post-Op Procedure(s) (LRB): TOTAL HIP ARTHROPLASTY (Right) Active Problems:   Status post total hip replacement, right  Estimated body mass index is 31.18 kg/m as calculated from the following:   Height as of 03/21/20: 5\' 3"  (1.6 m).   Weight as of 03/21/20: 79.8 kg. Up with therapy   Labs reviewed this AM, Hg 9.4, WBC 7.6.  BUN and Cr still elevated but improved. Encouraged incentive spirometer use. Continue with PT today.  Needs to clear stair training. Patient has had two bowel movements. Plan for discharge home tomorrow pending progress with PT.  DVT Prophylaxis - Lovenox, Foot Pumps and TED hose Weight-Bearing as tolerated to right leg  J. Cameron Proud, PA-C [facility] Orthopaedic Surgery 03/31/2020, 7:18 AM

## 2020-03-31 NOTE — Discharge Summary (Signed)
Physician Discharge Summary  Patient ID: Tammy Choi MRN: [id number] DOB/AGE: Mar 27, 1946 74 y.o.  Admit date: 03/29/2020 Discharge date: 04/01/2020  Admission Diagnoses:  Status post total hip replacement, right [Z96.641]   Discharge Diagnoses: Patient Active Problem List   Diagnosis Date Noted  . Status post total hip replacement, right 03/29/2020  . UTI (urinary tract infection) 10/23/2019  . Hydronephrosis 06/02/2019  . Allergic rhinitis 02/10/2019  . Cough 09/26/2018  . Malignant neoplasm of lower-inner quadrant of right breast of female, estrogen receptor positive ([place]) 05/14/2018  . Bilateral lower extremity edema 01/09/2017  . Prediabetes 12/16/2016  . Actinic keratoses 10/09/2016  . Myalgia 07/10/2016  . Left wrist pain 07/21/2015  . Anxiety state 07/21/2015  . Obesity (BMI 30-39.9) 07/30/2014  . Dyspnea on exertion 03/30/2014  . COPD (chronic obstructive pulmonary disease) ([place]) 01/21/2013  . Breast mass, left 09/05/2012  . Chronic back pain 01/15/2012  . Depression 01/15/2012  . Hyperlipidemia 01/15/2012  . Hypertension 08/07/2011    Past Medical History:  Diagnosis Date  . Arthritis   . Breast cancer ([place]) 05/07/2018   T1a, N0; ER/ PR positive, Her 2 neu not overexpressed.   Marland Kitchen COPD (chronic obstructive pulmonary disease) ([facility])    Not on home o2  . Depression   . HTN (hypertension)   . Hyperlipemia   . Personal history of radiation therapy      Transfusion: None.   Consultants (if any):   Discharged Condition: Improved  Hospital Course: Tammy Choi is an 74 y.o. female who was admitted 03/29/2020 with a diagnosis of primary osteoarthritis of the right hip and went to the operating room on 03/29/2020 and underwent the above named procedures.    Surgeries: Procedure(s): TOTAL HIP ARTHROPLASTY on 03/29/2020 Patient tolerated the surgery well. Taken to PACU where she was stabilized and then transferred to the orthopedic floor.  Started on  Lovenox 40mg  q 24 hrs. Foot pumps applied bilaterally at 80 mm. Heels elevated on bed with rolled towels. No evidence of DVT. Negative Homan. Physical therapy started on day #1 for gait training and transfer. OT started day #1 for ADL and assisted devices.  Patient's IV was removed on POD3.  Implants: Biomet press-fit system with a #12 laterally offset Echo femoral stem, a 50 mm acetabular shell with an E-poly hi-wall liner, and a 36 mm ceramic head with a +3 mm neck.  She was given perioperative antibiotics:  Anti-infectives (From admission, onward)   Start     Dose/Rate Route Frequency Ordered Stop   03/29/20 1400  clindamycin (CLEOCIN) IVPB 600 mg        600 mg 100 mL/hr over 30 Minutes Intravenous Every 6 hours 03/29/20 1151 03/30/20 1120   03/29/20 0600  clindamycin (CLEOCIN) IVPB 900 mg        900 mg 100 mL/hr over 30 Minutes Intravenous On call to O.R. 03/29/20 0102 03/29/20 0746    .  She was given sequential compression devices, early ambulation, and Lovenox for DVT prophylaxis.  She benefited maximally from the hospital stay and there were no complications.    Recent vital signs:  Vitals:   04/01/20 0005 04/01/20 0754  BP: 130/60 (!) 142/58  Pulse: 90 87  Resp:  17  Temp: 98.4 F (36.9 C) 98.6 F (37 C)  SpO2: 92% 96%    Recent laboratory studies:  Lab Results  Component Value Date   HGB 9.4 (L) 03/31/2020   HGB 9.1 (L) 03/30/2020   HGB 12.4 03/25/2020  Lab Results  Component Value Date   WBC 7.6 03/31/2020   PLT 190 03/31/2020   No results found for: INR Lab Results  Component Value Date   NA 136 04/01/2020   K 4.4 04/01/2020   CL 101 04/01/2020   CO2 27 04/01/2020   BUN 18 04/01/2020   CREATININE 0.89 04/01/2020   GLUCOSE 103 (H) 04/01/2020    Discharge Medications:   Allergies as of 04/01/2020      Reactions   Penicillins Anaphylaxis, Other (See Comments)   Has patient had a PCN reaction causing immediate rash, facial/tongue/throat swelling,  SOB or lightheadedness with hypotension: Yes Has patient had a PCN reaction causing severe rash involving mucus membranes or skin necrosis: No Has patient had a PCN reaction that required hospitalization No Has patient had a PCN reaction occurring within the last 10 years: No If all of the above answers are "NO", then may proceed with Cephalosporin use.      Medication List    TAKE these medications   acetaminophen 500 MG tablet Commonly known as: TYLENOL Take 500-1,000 mg by mouth 2 (two) times daily as needed for moderate pain or headache.   acetaminophen 650 MG CR tablet Commonly known as: TYLENOL Take 650 mg by mouth every 8 (eight) hours as needed for pain.   anastrozole 1 MG tablet Commonly known as: ARIMIDEX Take 1 tablet by mouth once daily   budesonide-formoterol 160-4.5 MCG/ACT inhaler Commonly known as: Symbicort Inhale 2 puffs into the lungs 2 (two) times daily.   cholecalciferol 1000 units tablet Commonly known as: VITAMIN D Take 2,000 Units by mouth 2 (two) times a week.   enoxaparin 40 MG/0.4ML injection Commonly known as: LOVENOX Inject 0.4 mLs (40 mg total) into the skin daily.   FLUoxetine 40 MG capsule Commonly known as: PROZAC Take 1 capsule by mouth at bedtime   fluticasone 50 MCG/ACT nasal spray Commonly known as: FLONASE Place 2 sprays into both nostrils daily. What changed:   when to take this  reasons to take this   furosemide 20 MG tablet Commonly known as: LASIX Take 0.5 tablets (10 mg total) by mouth daily as needed for edema.   hydrochlorothiazide 12.5 MG tablet Commonly known as: HYDRODIURIL Take 1 tablet by mouth once daily What changed: when to take this   lisinopril 40 MG tablet Commonly known as: ZESTRIL Take 1 tablet by mouth once daily   multivitamin with minerals tablet Take 1 tablet by mouth at bedtime.   nitrofurantoin (macrocrystal-monohydrate) 100 MG capsule Commonly known as: Macrobid Take 1 capsule (100 mg  total) by mouth 2 (two) times daily.   oxyCODONE 5 MG immediate release tablet Commonly known as: Oxy IR/ROXICODONE Take 1-2 tablets (5-10 mg total) by mouth every 4 (four) hours as needed for severe pain.   ProAir HFA 108 (90 Base) MCG/ACT inhaler Generic drug: albuterol INHALE 2 PUFFS BY MOUTH EVERY 6 HOURS AS NEEDED FOR WHEEZING OR SHORTNESS OF BREATH What changed: See the new instructions.   rosuvastatin 5 MG tablet Commonly known as: CRESTOR Take 1 tablet by mouth once daily What changed: when to take this   traMADol 50 MG tablet Commonly known as: ULTRAM Take 1-2 tablets (50-100 mg total) by mouth every 6 (six) hours as needed for moderate pain.            Durable Medical Equipment  (From admission, onward)         Start     Ordered   03/29/20  78  DME Bedside commode  Once       Question:  Patient needs a bedside commode to treat with the following condition  Answer:  Status post total hip replacement, right   03/29/20 1151   03/29/20 1152  DME 3 n 1  Once        03/29/20 1151   03/29/20 1152  DME Walker rolling  Once       Question Answer Comment  Walker: With 5 Inch Wheels   Patient needs a walker to treat with the following condition Status post total hip replacement, right      03/29/20 1151          Diagnostic Studies: DG HIP UNILAT W OR W/O PELVIS 2-3 VIEWS RIGHT  Result Date: 03/29/2020 CLINICAL DATA:  Status post right hip replacement EXAM: DG HIP (WITH OR WITHOUT PELVIS) 2V RIGHT COMPARISON:  None. FINDINGS: Right hip prosthesis is noted in satisfactory position. No acute bony or soft tissue abnormality is seen. IMPRESSION: Status post right hip replacement Electronically Signed   By: Inez Catalina M.D.   On: 03/29/2020 10:20    Disposition: Plan for discharge home today following PT.     Follow-up Information    Lattie Corns, PA-C Follow up in 14 day(s).   Specialty: Physician Assistant Why: Electa Sniff information: [place] [address] [phone]              Signed: Judson Roch PA-C 04/01/2020, 7:59 AM

## 2020-03-31 NOTE — Progress Notes (Signed)
Physical Therapy Treatment Patient Details Name: Tammy Choi MRN: [id number] DOB: Dec 11, 1945 Today's Date: 03/31/2020    History of Present Illness admitted for acute hospitalization status post R THA, posterior approach, WBAT (03/29/20)    PT Comments    Pt was supine in bed upon arriving. She reports continued increased pain versus previous date and requested not to get OOB 2/2 to pain/fatigue this afternoon. Pt demonstrated safe mobility with transfers and ambulation earlier in the day. She did agree to performing there ex in bed. See exercises listed below. PT will perform stair training with patient/pt's daughter(caregiver) in AM in prep from DC to home if cleared by MD for safe DC to home. Overall pt is doing well but had rough day with PT today 2/2 to pain.      Follow Up Recommendations  Home health PT     Equipment Recommendations  Rolling walker with 5" wheels;3in1 (PT)    Recommendations for Other Services       Precautions / Restrictions Precautions Precautions: Fall;Posterior Hip Precaution Booklet Issued: Yes (comment) Restrictions Weight Bearing Restrictions: Yes RLE Weight Bearing: Weight bearing as tolerated    Mobility  Bed Mobility                  Transfers                    Ambulation/Gait                 Stairs             Wheelchair Mobility    Modified Rankin (Stroke Patients Only)       Balance                                            Cognition Arousal/Alertness: Awake/alert Behavior During Therapy: WFL for tasks assessed/performed Overall Cognitive Status: Within Functional Limits for tasks assessed                                 General Comments: pt is more fatigued this afternoon requesting to only perform ther ex in bed.      Exercises Total Joint Exercises Ankle Circles/Pumps: AROM;Both;10 reps Quad Sets: AROM;10 reps;Right Gluteal Sets: AROM;10  reps Towel Squeeze: AROM;10 reps Short Arc Quad: AROM;10 reps Heel Slides: AROM;10 reps Hip ABduction/ADduction: AAROM;10 reps    General Comments        Pertinent Vitals/Pain Pain Assessment: 0-10 Pain Score: 6  Faces Pain Scale: Hurts little more Pain Location: R hip Pain Descriptors / Indicators: Aching;Guarding;Grimacing Pain Intervention(s): Limited activity within patient's tolerance;Monitored during session;Premedicated before session;Repositioned    Home Living                      Prior Function            PT Goals (current goals can now be found in the care plan section) Acute Rehab PT Goals Patient Stated Goal: " I want to get better so I can go home." Progress towards PT goals: Progressing toward goals    Frequency    BID      PT Plan Current plan remains appropriate    Co-evaluation              AM-PAC PT "6 Clicks"  Mobility   Outcome Measure  Help needed turning from your back to your side while in a flat bed without using bedrails?: A Little Help needed moving from lying on your back to sitting on the side of a flat bed without using bedrails?: A Little Help needed moving to and from a bed to a chair (including a wheelchair)?: A Little Help needed standing up from a chair using your arms (e.g., wheelchair or bedside chair)?: A Little Help needed to walk in hospital room?: A Little Help needed climbing 3-5 steps with a railing? : A Little 6 Click Score: 18    End of Session   Activity Tolerance: Patient tolerated treatment well Patient left: in bed;with call bell/phone within reach;with bed alarm set;with SCD's reapplied Nurse Communication: Mobility status PT Visit Diagnosis: Muscle weakness (generalized) (M62.81);Pain;Other abnormalities of gait and mobility (R26.89) Pain - Right/Left: Right Pain - part of body: Hip     Time: 4695-0722 PT Time Calculation (min) (ACUTE ONLY): 15 min  Charges:  $Therapeutic Exercise: 8-22  mins                     Julaine Fusi PTA 03/31/20, 4:36 PM

## 2020-04-01 DIAGNOSIS — M1611 Unilateral primary osteoarthritis, right hip: Secondary | ICD-10-CM | POA: Insufficient documentation

## 2020-04-01 LAB — BASIC METABOLIC PANEL
Anion gap: 8 (ref 5–15)
BUN: 18 mg/dL (ref 8–23)
CO2: 27 mmol/L (ref 22–32)
Calcium: 8.6 mg/dL — ABNORMAL LOW (ref 8.9–10.3)
Chloride: 101 mmol/L (ref 98–111)
Creatinine, Ser: 0.89 mg/dL (ref 0.44–1.00)
GFR calc Af Amer: >60 mL/min (ref >60)
GFR calc non Af Amer: >60 mL/min (ref >60)
Glucose, Bld: 103 mg/dL — ABNORMAL HIGH (ref 70–99)
Potassium: 4.4 mmol/L (ref 3.5–5.1)
Sodium: 136 mmol/L (ref 135–145)

## 2020-04-01 NOTE — Progress Notes (Signed)
  Subjective: 3 Days Post-Op Procedure(s) (LRB): TOTAL HIP ARTHROPLASTY (Right) Patient reports pain as mild.   Patient is well, and has had no acute complaints or problems Plan is for discharge home after completion of PT. Negative for chest pain and shortness of breath Fever: No recent fevers Gastrointestinal:Negative for nausea and vomiting  Objective: Vital signs in last 24 hours: Temp:  [98.4 F (36.9 C)-98.6 F (37 C)] 98.6 F (37 C) (07/02 0754) Pulse Rate:  [83-90] 87 (07/02 0754) Resp:  [16-18] 17 (07/02 0754) BP: (111-142)/(55-60) 142/58 (07/02 0754) SpO2:  [92 %-96 %] 96 % (07/02 0754)  Intake/Output from previous day: No intake or output data in the 24 hours ending 04/01/20 0757  Intake/Output this shift: No intake/output data recorded.  Labs: Recent Labs    03/30/20 0410 03/31/20 0510  HGB 9.1* 9.4*   Recent Labs    03/30/20 0410 03/31/20 0510  WBC 7.6 7.6  RBC 3.07* 3.26*  HCT 26.8* 28.5*  PLT 176 190   Recent Labs    03/31/20 0510 04/01/20 0605  NA 136 136  K 4.9 4.4  CL 103 101  CO2 25 27  BUN 27* 18  CREATININE 1.13* 0.89  GLUCOSE 103* 103*  CALCIUM 8.5* 8.6*   No results for input(s): LABPT, INR in the last 72 hours.   EXAM General - Patient is Alert, Appropriate and Oriented Extremity - ABD soft Sensation intact distally Intact pulses distally Dorsiflexion/Plantar flexion intact Incision: scant drainage No cellulitis present Dressing/Incision - blood tinged drainage to the distal aspect of the incision. Motor Function - intact, moving foot and toes well on exam.  Negative Homans to bilateral legs.  Past Medical History:  Diagnosis Date  . Arthritis   . Breast cancer ([place]) 05/07/2018   T1a, N0; ER/ PR positive, Her 2 neu not overexpressed.   Marland Kitchen COPD (chronic obstructive pulmonary disease) ([facility])    Not on home o2  . Depression   . HTN (hypertension)   . Hyperlipemia   . Personal history of radiation therapy      Assessment/Plan: 3 Days Post-Op Procedure(s) (LRB): TOTAL HIP ARTHROPLASTY (Right) Active Problems:   Status post total hip replacement, right  Estimated body mass index is 31.18 kg/m as calculated from the following:   Height as of 03/21/20: 5\' 3"  (1.6 m).   Weight as of 03/21/20: 79.8 kg. Up with therapy   Labs reviewed this AM BUN and Cr back to baseline Has cleared all steps with PT. Patient has had three bowel movements. Plan for discharge home today.  DVT Prophylaxis - Lovenox, Foot Pumps and TED hose Weight-Bearing as tolerated to right leg  J. Cameron Proud, PA-C [facility] 04/01/2020, 7:57 AM

## 2020-04-01 NOTE — TOC Transition Note (Signed)
Transition of Care [facility]) - CM/SW Discharge Note   Patient Details  Name: Tammy Choi MRN: [id number] Date of Birth: 1946-09-05  [facility] CM/SW Contact:  Su Hilt, RN Phone Number: 04/01/2020, 9:09 AM   Clinical Narrative:    The patient will DC to families home today, she has a rollator in the room but needs a Rolling walker, I notified Zack with [employer] and it will be switched out, she is set up with [facility] services and Helene Kelp is aware of the address at the families house She has a handicapped toilet at the house and will not need a 3 in 1 as a result.  She has transportation and is up to date with her PCP She can afford her medication          Patient Goals and CMS Choice        Discharge Placement                       Discharge Plan and Services                                     Social Determinants of Health (SDOH) Interventions     Readmission Risk Interventions No flowsheet data found.

## 2020-04-01 NOTE — Care Management Important Message (Signed)
Important Message  Patient Details  Name: Tammy Choi MRN: [id number] Date of Birth: October 19, 1945   Medicare Important Message Given:  Yes     [name] 04/01/2020, 9:08 AM

## 2020-04-01 NOTE — Progress Notes (Addendum)
Discharge note: Reviewed discharge instructions with pt and daughter.  Pt and daughter verbalized understanding. Pt leaving facility with a Rolator, polar care. Knee high ted hose on bilateral lower extremity. Obtained vitals. D/C IV catheter on left forehand. IV intact. Pt wheeled out by staff. Pt transported to home via private vehicle.

## 2020-04-01 NOTE — Progress Notes (Signed)
Physical Therapy Treatment Patient Details Name: Tammy Choi MRN: [id number] DOB: 04-24-1946 Today's Date: 04/01/2020    History of Present Illness admitted for acute hospitalization status post R THA, posterior approach, WBAT (03/29/20)    PT Comments    Pt was seated in recliner upon arriving. She agrees to PT session and reports improved pain control this date. Stood to [employer] without assistance and ambulated 150 ft. Pt does fatigue quickly with ambulation with HR elevation to 115 bpm with sao2 >91% on rm air. With prolonged seated rest, pt's HR lowers to 90 bpm prior to performing ascending/descending step 3 x with CGA + RW for safety. She reports feeling confident she can safely perform at home. Pt is cleared from PT standpoint for safe DC home with HHPT to continue to progress strength, endurance, and safe functional mobility. Pt was seated in recliner with call bell in reach, ice pack applied, and RN aware of pt's abilities.     Follow Up Recommendations  Home health PT     Equipment Recommendations  Rolling walker with 5" wheels;3in1 (PT);Other (comment) (pt has rollator but will require RW for improved safety)    Recommendations for Other Services       Precautions / Restrictions Precautions Precautions: Fall;Posterior Hip Precaution Booklet Issued: Yes (comment) Precaution Comments: pt able to recall 3/3 hip precautions Restrictions Weight Bearing Restrictions: Yes RLE Weight Bearing: Weight bearing as tolerated    Mobility  Bed Mobility               General bed mobility comments: pt was seated in recliner pre/post session  Transfers Overall transfer level: Modified independent Equipment used: Rolling walker (2 wheeled) Transfers: Sit to/from Stand Sit to Stand: Supervision            Ambulation/Gait Ambulation/Gait assistance: Supervision Gait Distance (Feet): 150 Feet Assistive device: Rolling walker (2 wheeled) Gait Pattern/deviations: Step-to  pattern;Step-through pattern;Antalgic;Decreased stance time - right Gait velocity: decreased   General Gait Details: no LOB or unsteadiness. She does fatigue quickly 2/2 to COPD   Stairs Stairs: Yes Stairs assistance: Min guard Stair Management: No rails;With walker;Step to pattern Number of Stairs: 1 General stair comments: Pt was able to ascend/descend 1 step with RW 3 x with CGA for safety. pt reports feeling confident she can safely get in/out of house   Wheelchair Mobility    Modified Rankin (Stroke Patients Only)       Balance Overall balance assessment: Needs assistance Sitting-balance support: No upper extremity supported;Feet supported Sitting balance-Leahy Scale: Good Sitting balance - Comments: no LOB in sitting   Standing balance support: Bilateral upper extremity supported Standing balance-Leahy Scale: Good Standing balance comment: no LOB during standing activity in static and dynamic                             Cognition Arousal/Alertness: Awake/alert Behavior During Therapy: WFL for tasks assessed/performed Overall Cognitive Status: Within Functional Limits for tasks assessed                                 General Comments: Pt is A & O x 4. Reports less pain today       Exercises      General Comments        Pertinent Vitals/Pain Pain Assessment: 0-10 Pain Score: 3  Faces Pain Scale: Hurts a little bit Pain Location:  R hip Pain Descriptors / Indicators: Aching;Guarding;Grimacing Pain Intervention(s): Limited activity within patient's tolerance;Monitored during session;Premedicated before session;Repositioned;Ice applied    Home Living                      Prior Function            PT Goals (current goals can now be found in the care plan section) Acute Rehab PT Goals Patient Stated Goal: go home and start moving more Progress towards PT goals: Progressing toward goals    Frequency    BID       PT Plan Current plan remains appropriate    Co-evaluation              AM-PAC PT "6 Clicks" Mobility   Outcome Measure  Help needed turning from your back to your side while in a flat bed without using bedrails?: A Little Help needed moving from lying on your back to sitting on the side of a flat bed without using bedrails?: A Little Help needed moving to and from a bed to a chair (including a wheelchair)?: A Little Help needed standing up from a chair using your arms (e.g., wheelchair or bedside chair)?: A Little Help needed to walk in hospital room?: A Little Help needed climbing 3-5 steps with a railing? : A Little 6 Click Score: 18    End of Session Equipment Utilized During Treatment: Gait belt Activity Tolerance: Patient tolerated treatment well Patient left: in chair;with call bell/phone within reach;with chair alarm set Nurse Communication: Mobility status PT Visit Diagnosis: Muscle weakness (generalized) (M62.81);Pain;Other abnormalities of gait and mobility (R26.89) Pain - Right/Left: Right Pain - part of body: Hip     Time: 3343-5686 PT Time Calculation (min) (ACUTE ONLY): 24 min  Charges:  $Gait Training: 23-37 mins                     Julaine Fusi PTA 04/01/20, 9:56 AM

## 2020-04-02 DIAGNOSIS — J309 Allergic rhinitis, unspecified: Secondary | ICD-10-CM | POA: Diagnosis not present

## 2020-04-02 DIAGNOSIS — E669 Obesity, unspecified: Secondary | ICD-10-CM | POA: Diagnosis not present

## 2020-04-02 DIAGNOSIS — J449 Chronic obstructive pulmonary disease, unspecified: Secondary | ICD-10-CM | POA: Diagnosis not present

## 2020-04-02 DIAGNOSIS — R7303 Prediabetes: Secondary | ICD-10-CM | POA: Diagnosis not present

## 2020-04-02 DIAGNOSIS — F329 Major depressive disorder, single episode, unspecified: Secondary | ICD-10-CM | POA: Diagnosis not present

## 2020-04-02 DIAGNOSIS — Z79899 Other long term (current) drug therapy: Secondary | ICD-10-CM | POA: Diagnosis not present

## 2020-04-02 DIAGNOSIS — Z471 Aftercare following joint replacement surgery: Secondary | ICD-10-CM | POA: Diagnosis not present

## 2020-04-02 DIAGNOSIS — N1330 Unspecified hydronephrosis: Secondary | ICD-10-CM | POA: Diagnosis not present

## 2020-04-02 DIAGNOSIS — I10 Essential (primary) hypertension: Secondary | ICD-10-CM | POA: Diagnosis not present

## 2020-04-02 DIAGNOSIS — Z6829 Body mass index (BMI) 29.0-29.9, adult: Secondary | ICD-10-CM | POA: Diagnosis not present

## 2020-04-02 DIAGNOSIS — Z87440 Personal history of urinary (tract) infections: Secondary | ICD-10-CM | POA: Diagnosis not present

## 2020-04-02 DIAGNOSIS — Z96641 Presence of right artificial hip joint: Secondary | ICD-10-CM | POA: Diagnosis not present

## 2020-04-02 DIAGNOSIS — Z7951 Long term (current) use of inhaled steroids: Secondary | ICD-10-CM | POA: Diagnosis not present

## 2020-04-02 DIAGNOSIS — E785 Hyperlipidemia, unspecified: Secondary | ICD-10-CM | POA: Diagnosis not present

## 2020-04-02 DIAGNOSIS — L570 Actinic keratosis: Secondary | ICD-10-CM | POA: Diagnosis not present

## 2020-04-02 DIAGNOSIS — Z853 Personal history of malignant neoplasm of breast: Secondary | ICD-10-CM | POA: Diagnosis not present

## 2020-04-05 LAB — SURGICAL PATHOLOGY

## 2020-04-10 DIAGNOSIS — Z471 Aftercare following joint replacement surgery: Secondary | ICD-10-CM | POA: Diagnosis not present

## 2020-04-11 ENCOUNTER — Inpatient Hospital Stay: Payer: PPO | Admitting: Oncology

## 2020-04-11 ENCOUNTER — Inpatient Hospital Stay: Payer: PPO

## 2020-04-18 DIAGNOSIS — I10 Essential (primary) hypertension: Secondary | ICD-10-CM | POA: Diagnosis not present

## 2020-04-18 DIAGNOSIS — J449 Chronic obstructive pulmonary disease, unspecified: Secondary | ICD-10-CM | POA: Diagnosis not present

## 2020-04-18 DIAGNOSIS — Z471 Aftercare following joint replacement surgery: Secondary | ICD-10-CM | POA: Diagnosis not present

## 2020-04-18 DIAGNOSIS — L570 Actinic keratosis: Secondary | ICD-10-CM | POA: Diagnosis not present

## 2020-04-18 DIAGNOSIS — Z853 Personal history of malignant neoplasm of breast: Secondary | ICD-10-CM | POA: Diagnosis not present

## 2020-04-18 DIAGNOSIS — Z87440 Personal history of urinary (tract) infections: Secondary | ICD-10-CM | POA: Diagnosis not present

## 2020-04-18 DIAGNOSIS — E785 Hyperlipidemia, unspecified: Secondary | ICD-10-CM | POA: Diagnosis not present

## 2020-04-18 DIAGNOSIS — R7303 Prediabetes: Secondary | ICD-10-CM | POA: Diagnosis not present

## 2020-04-18 DIAGNOSIS — Z7951 Long term (current) use of inhaled steroids: Secondary | ICD-10-CM | POA: Diagnosis not present

## 2020-04-18 DIAGNOSIS — E669 Obesity, unspecified: Secondary | ICD-10-CM | POA: Diagnosis not present

## 2020-04-18 DIAGNOSIS — Z6829 Body mass index (BMI) 29.0-29.9, adult: Secondary | ICD-10-CM | POA: Diagnosis not present

## 2020-04-18 DIAGNOSIS — Z96641 Presence of right artificial hip joint: Secondary | ICD-10-CM | POA: Diagnosis not present

## 2020-04-18 DIAGNOSIS — F329 Major depressive disorder, single episode, unspecified: Secondary | ICD-10-CM | POA: Diagnosis not present

## 2020-04-18 DIAGNOSIS — J309 Allergic rhinitis, unspecified: Secondary | ICD-10-CM | POA: Diagnosis not present

## 2020-04-18 DIAGNOSIS — Z79899 Other long term (current) drug therapy: Secondary | ICD-10-CM | POA: Diagnosis not present

## 2020-04-18 DIAGNOSIS — N1330 Unspecified hydronephrosis: Secondary | ICD-10-CM | POA: Diagnosis not present

## 2020-04-27 ENCOUNTER — Other Ambulatory Visit: Payer: Self-pay

## 2020-04-27 DIAGNOSIS — C50311 Malignant neoplasm of lower-inner quadrant of right female breast: Secondary | ICD-10-CM

## 2020-04-28 ENCOUNTER — Other Ambulatory Visit: Payer: PPO

## 2020-05-13 DIAGNOSIS — M1611 Unilateral primary osteoarthritis, right hip: Secondary | ICD-10-CM | POA: Diagnosis not present

## 2020-05-17 ENCOUNTER — Other Ambulatory Visit: Payer: Self-pay | Admitting: Family Medicine

## 2020-05-30 ENCOUNTER — Inpatient Hospital Stay: Payer: PPO | Attending: Oncology | Admitting: Oncology

## 2020-05-30 ENCOUNTER — Other Ambulatory Visit: Payer: Self-pay

## 2020-05-30 ENCOUNTER — Inpatient Hospital Stay: Payer: PPO

## 2020-05-30 ENCOUNTER — Encounter: Payer: Self-pay | Admitting: Oncology

## 2020-05-30 VITALS — BP 105/61 | HR 91 | Temp 97.1°F | Resp 18 | Wt 159.3 lb

## 2020-05-30 DIAGNOSIS — Z170 Estrogen receptor positive status [ER+]: Secondary | ICD-10-CM

## 2020-05-30 DIAGNOSIS — Z96653 Presence of artificial knee joint, bilateral: Secondary | ICD-10-CM | POA: Diagnosis not present

## 2020-05-30 DIAGNOSIS — Z803 Family history of malignant neoplasm of breast: Secondary | ICD-10-CM | POA: Diagnosis not present

## 2020-05-30 DIAGNOSIS — Z90710 Acquired absence of both cervix and uterus: Secondary | ICD-10-CM | POA: Insufficient documentation

## 2020-05-30 DIAGNOSIS — Z87891 Personal history of nicotine dependence: Secondary | ICD-10-CM | POA: Diagnosis not present

## 2020-05-30 DIAGNOSIS — D649 Anemia, unspecified: Secondary | ICD-10-CM

## 2020-05-30 DIAGNOSIS — N1330 Unspecified hydronephrosis: Secondary | ICD-10-CM | POA: Diagnosis not present

## 2020-05-30 DIAGNOSIS — C50311 Malignant neoplasm of lower-inner quadrant of right female breast: Secondary | ICD-10-CM | POA: Diagnosis not present

## 2020-05-30 DIAGNOSIS — Z79811 Long term (current) use of aromatase inhibitors: Secondary | ICD-10-CM

## 2020-05-30 DIAGNOSIS — N179 Acute kidney failure, unspecified: Secondary | ICD-10-CM

## 2020-05-30 LAB — COMPREHENSIVE METABOLIC PANEL
ALT: 13 U/L (ref 0–44)
AST: 15 U/L (ref 15–41)
Albumin: 3.8 g/dL (ref 3.5–5.0)
Alkaline Phosphatase: 77 U/L (ref 38–126)
Anion gap: 9 (ref 5–15)
BUN: 42 mg/dL — ABNORMAL HIGH (ref 8–23)
CO2: 25 mmol/L (ref 22–32)
Calcium: 9 mg/dL (ref 8.9–10.3)
Chloride: 105 mmol/L (ref 98–111)
Creatinine, Ser: 1.46 mg/dL — ABNORMAL HIGH (ref 0.44–1.00)
GFR calc Af Amer: 41 mL/min — ABNORMAL LOW (ref >60)
GFR calc non Af Amer: 35 mL/min — ABNORMAL LOW (ref >60)
Glucose, Bld: 103 mg/dL — ABNORMAL HIGH (ref 70–99)
Potassium: 4.6 mmol/L (ref 3.5–5.1)
Sodium: 139 mmol/L (ref 135–145)
Total Bilirubin: 0.3 mg/dL (ref 0.3–1.2)
Total Protein: 7.1 g/dL (ref 6.5–8.1)

## 2020-05-30 LAB — CBC WITH DIFFERENTIAL/PLATELET
Abs Immature Granulocytes: 0.02 10*3/uL (ref 0.00–0.07)
Basophils Absolute: 0 10*3/uL (ref 0.0–0.1)
Basophils Relative: 1 %
Eosinophils Absolute: 0.1 10*3/uL (ref 0.0–0.5)
Eosinophils Relative: 1 %
HCT: 37.2 % (ref 36.0–46.0)
Hemoglobin: 11.9 g/dL — ABNORMAL LOW (ref 12.0–15.0)
Immature Granulocytes: 0 %
Lymphocytes Relative: 36 %
Lymphs Abs: 2.4 10*3/uL (ref 0.7–4.0)
MCH: 28.3 pg (ref 26.0–34.0)
MCHC: 32 g/dL (ref 30.0–36.0)
MCV: 88.4 fL (ref 80.0–100.0)
Monocytes Absolute: 0.4 10*3/uL (ref 0.1–1.0)
Monocytes Relative: 7 %
Neutro Abs: 3.7 10*3/uL (ref 1.7–7.7)
Neutrophils Relative %: 55 %
Platelets: 278 10*3/uL (ref 150–400)
RBC: 4.21 MIL/uL (ref 3.87–5.11)
RDW: 14.3 % (ref 11.5–15.5)
WBC: 6.6 10*3/uL (ref 4.0–10.5)
nRBC: 0 % (ref 0.0–0.2)

## 2020-05-30 MED ORDER — ANASTROZOLE 1 MG PO TABS: 1.0000 mg | ORAL_TABLET | Freq: Every day | ORAL | 1 refills | Status: DC

## 2020-05-30 NOTE — Progress Notes (Signed)
Hematology/Oncology follow up note [facility] Telephone:([phone] [phone]) [phone]   Patient Care Team: Leone Haven, MD as PCP - General (Family Medicine) Bary Castilla, [name], MD (General Surgery)  REFERRING PROVIDER: Dr. Bary Castilla REASON FOR VISIT:  Evaluation of breast cancer  HISTORY OF PRESENTING ILLNESS:  Tammy Choi is a  74 y.o.  female with PMH listed below who was referred to me for evaluation of newly diagnosed breast cancer. Patient had mammogram 05/01/2018 which showed indeterminate microcalcification in the medial right breast measuring 0.9 x 0.7 x 0.7 cm Biopsy pathology showed: Invasive mammary carcinoma, DCIS, calcification associated with DCIS, grade 2, ER positive PR negative, HER-2 negative (IHC 1+)  Nipple discharge: Denies Family history: Sister passed away from breast cancer History of radiation to chest: denies.  Previous breast surgery: Left breast biopsy with benign etiology. . 06/16/2018, right breast lumpectomy and sentinel lymph node biopsy showed residual invasive mammary carcinoma, residual ductal carcinoma in situ, biopsy site changes and metallic clip.  Incidental 5 mm fibroadenoma.  All 3 sentinel lymph node negative. ER positive HER2 negative.  Patient underwent adjuvant radiation finished in January 2020. Started aromatase inhibitor with Arimidex 1 mg daily since 1/20/ 2020.  # she followed up with urology Dr. Diamantina Providence for bilateral hydronephrosis. Patient was recommended to have CT urogram done for evaluation of filling defects with monitoring kidney function versus more invasive investigation with cystoscopy, bilateral retrograde pyelograms and possible ureteroscopy possible biopsy.she choose to have a image surveillance with monitoring of kidney function.  INTERVAL HISTORY Tammy Choi is a 74 y.o. female who has above history reviewed by me today presents for follow up visit for management of Stage IA  Breast cancer, Estrogen receptor positive, HER 2 negative.   Patient takes Arimidex and tolerates well.  She recently had right hip replacement recently. Post operatively she has been on Lovenox for 14 days.  She has no new concerns of her breasts.  Denies any NSAIDS use.     Review of Systems  Constitutional: Negative for chills, fever, malaise/fatigue and weight loss.  HENT: Negative for nosebleeds and sore throat.   Eyes: Negative for double vision, photophobia and redness.  Respiratory: Negative for cough, shortness of breath and wheezing.   Cardiovascular: Negative for chest pain, palpitations, orthopnea and leg swelling.  Gastrointestinal: Negative for abdominal pain, blood in stool, nausea and vomiting.  Genitourinary: Negative for dysuria.  Musculoskeletal: Negative for back pain, myalgias and neck pain.       Right hip replacement.   Skin: Negative for itching and rash.  Neurological: Negative for dizziness, tingling and tremors.  Endo/Heme/Allergies: Negative for environmental allergies. Does not bruise/bleed easily.  Psychiatric/Behavioral: Negative for depression and hallucinations. The patient is not nervous/anxious.     MEDICAL HISTORY:  Past Medical History:  Diagnosis Date  . Arthritis   . Breast cancer ([place]) 05/07/2018   T1a, N0; ER/ PR positive, Her 2 neu not overexpressed.   Marland Kitchen COPD (chronic obstructive pulmonary disease) (HCC)    Not on home o2  . Depression   . HTN (hypertension)   . Hyperlipemia   . Personal history of radiation therapy     SURGICAL HISTORY: Past Surgical History:  Procedure Laterality Date  . ABDOMINAL HYSTERECTOMY    . BREAST BIOPSY Left 04/30/2013   neg core  . BREAST BIOPSY Right 05/07/2018   right breast stereo x clip INVASIVE MAMMARY CARCINOMA  . BREAST LUMPECTOMY Right 2019  . BREAST LUMPECTOMY WITH SENTINEL  LYMPH NODE BIOPSY Right 06/16/2018   Procedure: BREAST LUMPECTOMY WITH SENTINEL LYMPH NODE BX;  Surgeon: Robert Bellow, MD;  Location: [facility] ORS;  Service: General;  Laterality: Right;  . COLONOSCOPY  2014   Dr Bary Castilla  . Left Leg Surgery    . TONSILLECTOMY    . TOTAL HIP ARTHROPLASTY Right 03/29/2020   Procedure: TOTAL HIP ARTHROPLASTY;  Surgeon: Corky Mull, MD;  Location: [facility] ORS;  Service: Orthopedics;  Laterality: Right;    SOCIAL HISTORY: Social History   Socioeconomic History  . Marital status: Divorced    Spouse name: Not on file  . Number of children: Not on file  . Years of education: Not on file  . Highest education level: Not on file  Occupational History  . Occupation: Retired Database administrator: retired  Tobacco Use  . Smoking status: Former Smoker    Packs/day: 1.00    Years: 35.00    Pack years: 35.00    Types: Cigarettes    Quit date: 12/20/2015    Years since quitting: 4.4  . Smokeless tobacco: Never Used  Vaping Use  . Vaping Use: Never used  Substance and Sexual Activity  . Alcohol use: No    Alcohol/week: 0.0 standard drinks  . Drug use: No  . Sexual activity: Not on file  Other Topics Concern  . Not on file  Social History Narrative   Lives with daughter at home. Independent at baseline.   Social Determinants of Health   Financial Resource Strain:   . Difficulty of Paying Living Expenses: Not on file  Food Insecurity:   . Worried About Charity fundraiser in the Last Year: Not on file  . Ran Out of Food in the Last Year: Not on file  Transportation Needs:   . Lack of Transportation (Medical): Not on file  . Lack of Transportation (Non-Medical): Not on file  Physical Activity:   . Days of Exercise per Week: Not on file  . Minutes of Exercise per Session: Not on file  Stress:   . Feeling of Stress : Not on file  Social Connections:   . Frequency of Communication with Friends and Family: Not on file  . Frequency of Social Gatherings with Friends and Family: Not on file  . Attends Religious Services: Not on file  . Active Member of  Clubs or Organizations: Not on file  . Attends Archivist Meetings: Not on file  . Marital Status: Not on file  Intimate Partner Violence:   . Fear of Current or Ex-Partner: Not on file  . Emotionally Abused: Not on file  . Physically Abused: Not on file  . Sexually Abused: Not on file    FAMILY HISTORY: Family History  Problem Relation Age of Onset  . Kidney disease Mother        deceased 64  . Heart disease Father        deceased 85  . Breast cancer Other 78       maternal half-sister; deceased 83  . Colon cancer Neg Hx     ALLERGIES:  is allergic to penicillins.  MEDICATIONS:  Current Outpatient Medications  Medication Sig Dispense Refill  . acetaminophen (TYLENOL) 500 MG tablet Take 500-1,000 mg by mouth 2 (two) times daily as needed for moderate pain or headache.    Marland Kitchen acetaminophen (TYLENOL) 650 MG CR tablet Take 650 mg by mouth every 8 (eight) hours as needed for pain.    Marland Kitchen  anastrozole (ARIMIDEX) 1 MG tablet Take 1 tablet by mouth once daily 90 tablet 0  . budesonide-formoterol (SYMBICORT) 160-4.5 MCG/ACT inhaler Inhale 2 puffs into the lungs 2 (two) times daily. 1 Inhaler 5  . cholecalciferol (VITAMIN D) 1000 UNITS tablet Take 2,000 Units by mouth 2 (two) times a week.     . enoxaparin (LOVENOX) 40 MG/0.4ML injection Inject 0.4 mLs (40 mg total) into the skin daily. 5.6 mL 0  . FLUoxetine (PROZAC) 40 MG capsule Take 1 capsule by mouth at bedtime 90 capsule 0  . fluticasone (FLONASE) 50 MCG/ACT nasal spray Place 2 sprays into both nostrils daily. (Patient taking differently: Place 2 sprays into both nostrils daily as needed for allergies. ) 16 g 6  . furosemide (LASIX) 20 MG tablet Take 0.5 tablets (10 mg total) by mouth daily as needed for edema. 30 tablet 1  . hydrochlorothiazide (HYDRODIURIL) 12.5 MG tablet Take 1 tablet by mouth once daily 90 tablet 0  . lisinopril (ZESTRIL) 40 MG tablet Take 1 tablet by mouth once daily 90 tablet 0  . Multiple  Vitamins-Minerals (MULTIVITAMIN WITH MINERALS) tablet Take 1 tablet by mouth at bedtime.     . nitrofurantoin, macrocrystal-monohydrate, (MACROBID) 100 MG capsule Take 1 capsule (100 mg total) by mouth 2 (two) times daily. (Patient not taking: Reported on 03/16/2020) 10 capsule 0  . oxyCODONE (OXY IR/ROXICODONE) 5 MG immediate release tablet Take 1-2 tablets (5-10 mg total) by mouth every 4 (four) hours as needed for severe pain. 60 tablet 0  . PROAIR HFA 108 (90 Base) MCG/ACT inhaler INHALE 2 PUFFS BY MOUTH EVERY 6 HOURS AS NEEDED FOR WHEEZING OR SHORTNESS OF BREATH (Patient taking differently: Inhale 1-2 puffs into the lungs every 6 (six) hours as needed for wheezing or shortness of breath. ) 9 g 0  . rosuvastatin (CRESTOR) 5 MG tablet Take 1 tablet by mouth once daily 90 tablet 0  . traMADol (ULTRAM) 50 MG tablet Take 1-2 tablets (50-100 mg total) by mouth every 6 (six) hours as needed for moderate pain. 40 tablet 0   No current facility-administered medications for this visit.     PHYSICAL EXAMINATION: ECOG PERFORMANCE STATUS: 0 - Asymptomatic Vitals:   05/30/20 1326  BP: 105/61  Pulse: 91  Resp: 18  Temp: (!) 97.1 F (36.2 C)   Filed Weights   05/30/20 1326  Weight: 159 lb 4.8 oz (72.3 kg)    Physical Exam Constitutional:      General: She is not in acute distress. HENT:     Head: Normocephalic and atraumatic.  Eyes:     General: No scleral icterus.    Pupils: Pupils are equal, round, and reactive to light.  Cardiovascular:     Rate and Rhythm: Normal rate and regular rhythm.     Heart sounds: Normal heart sounds.  Pulmonary:     Effort: Pulmonary effort is normal. No respiratory distress.     Breath sounds: No wheezing.  Abdominal:     General: Bowel sounds are normal. There is no distension.     Palpations: Abdomen is soft. There is no mass.     Tenderness: There is no abdominal tenderness.  Musculoskeletal:        General: No deformity. Normal range of motion.      Cervical back: Normal range of motion and neck supple.  Skin:    General: Skin is warm and dry.     Findings: No erythema or rash.  Neurological:  Mental Status: She is alert and oriented to person, place, and time.     Cranial Nerves: No cranial nerve deficit.     Coordination: Coordination normal.  Psychiatric:        Behavior: Behavior normal.        Thought Content: Thought content normal.   Breast exam was performed in seated and lying down position. Patient is status post lumpectomy with a well-healed surgical scar, chronic tissue thickening around her scar about quarter size. Marland Kitchen     LABORATORY DATA:  I have reviewed the data as listed Lab Results  Component Value Date   WBC 6.6 05/30/2020   HGB 11.9 (L) 05/30/2020   HCT 37.2 05/30/2020   MCV 88.4 05/30/2020   PLT 278 05/30/2020   Recent Labs    10/19/19 1542 03/25/20 1128 03/30/20 0410 03/31/20 0510 04/01/20 0605  NA 138   < > 134* 136 136  K 4.4   < > 4.9 4.9 4.4  CL 102   < > 104 103 101  CO2 26   < > 25 25 27   GLUCOSE 99   < > 127* 103* 103*  BUN 34*   < > 39* 27* 18  CREATININE 1.09*   < > 1.33* 1.13* 0.89  CALCIUM 9.0   < > 8.4* 8.5* 8.6*  GFRNONAA 50*   < > 39* 48* >60  GFRAA 58*   < > 46* 55* >60  PROT 7.3  --   --   --   --   ALBUMIN 3.7  --   --   --   --   AST 14*  --   --   --   --   ALT 12  --   --   --   --   ALKPHOS 69  --   --   --   --   BILITOT 0.4  --   --   --   --    < > = values in this interval not displayed.   Iron/TIBC/Ferritin/ %Sat No results found for: IRON, TIBC, FERRITIN, IRONPCTSAT   RADIOGRAPHIC STUDIES: I have personally reviewed the radiological images as listed and agreed with the findings in the report.   ASSESSMENT & PLAN:  1. Malignant neoplasm of lower-inner quadrant of right breast of female, estrogen receptor positive ([place])   2. Aromatase inhibitor use   3. AKI (acute kidney injury) ([place])   Cancer Staging Malignant neoplasm of lower-inner quadrant of  right breast of female, estrogen receptor positive ([place]) Staging form: Breast, AJCC 8th Edition - Clinical: No stage assigned - Unsigned - Pathologic stage from 09/29/2018: Stage IA (pT1a, pN0, cM0, G2, ER+, PR-, HER2-) - Signed by Earlie Server, MD on 09/29/2018  Stage IA breast cancer ER positive, PR negative, HER-2 negative. Clinically doing well. Continue Arimidex 43m daily.  Recommend continue Calcium and Vitamin D supplementation.  She needs bone density testing. Will discuss at next visit.   #Bilateral kidney hydronephrosis, continue follow-up with urology.   Labs showed decreased kidney function. Advise patient to increase oral hydration. Follow up with urology and PCP.  Repeat kidney function with PCP in 1 week.    All questions were answered. The patient knows to call the clinic with any problems questions or concerns. Return of visit: 4 months for labs, MD reassessment.  ZEarlie Server MD, PhD Hematology Oncology [facility]at [facility] CaliforniaPager- [phone]/30/2021

## 2020-05-30 NOTE — Progress Notes (Signed)
Pt here for follow up. Reports that she had right hip surgery since last visit. Also reports that she has lost about 30 pounds due to changing eating habits.

## 2020-05-31 ENCOUNTER — Telehealth: Payer: Self-pay | Admitting: Family Medicine

## 2020-05-31 DIAGNOSIS — N179 Acute kidney failure, unspecified: Secondary | ICD-10-CM

## 2020-05-31 LAB — MULTIPLE MYELOMA PANEL, SERUM
Albumin SerPl Elph-Mcnc: 3.5 g/dL (ref 2.9–4.4)
Albumin/Glob SerPl: 1.3 (ref 0.7–1.7)
Alpha 1: 0.3 g/dL (ref 0.0–0.4)
Alpha2 Glob SerPl Elph-Mcnc: 0.9 g/dL (ref 0.4–1.0)
B-Globulin SerPl Elph-Mcnc: 0.9 g/dL (ref 0.7–1.3)
Gamma Glob SerPl Elph-Mcnc: 0.8 g/dL (ref 0.4–1.8)
Globulin, Total: 2.8 g/dL (ref 2.2–3.9)
IgA: 122 mg/dL (ref 64–422)
IgG (Immunoglobin G), Serum: 755 mg/dL (ref 586–1602)
IgM (Immunoglobulin M), Srm: 53 mg/dL (ref 26–217)
Total Protein ELP: 6.3 g/dL (ref 6.0–8.5)

## 2020-05-31 LAB — KAPPA/LAMBDA LIGHT CHAINS
Kappa free light chain: 67.9 mg/L — ABNORMAL HIGH (ref 3.3–19.4)
Kappa, lambda light chain ratio: 2.72 — ABNORMAL HIGH (ref 0.26–1.65)
Lambda free light chains: 25 mg/L (ref 5.7–26.3)

## 2020-05-31 NOTE — Telephone Encounter (Signed)
Please let the patient know that her oncologist wanted [place] to recheck her kidney function in about a week.  She needs to increase her hydration.  Please get her scheduled.  Order placed.

## 2020-05-31 NOTE — Telephone Encounter (Signed)
-----   Message from Earlie Server, MD sent at 05/30/2020  9:08 PM EDT ----- Dr.Despina Boan, Patient has further decrease of her kidney function. Advised her to increase oral hydration. She has had impaired kidney function which improved with hydration in the past, also hydronephrosis. She was previously seen by urology. I asked her to increase hydration.  Would you please run a follow up kidney function in 1-2 weeks and send her to urology if needed. Thank Maeola Harman

## 2020-06-02 NOTE — Telephone Encounter (Signed)
I called and spoke with the patient and informed her that her Oncologist wanted to recheck her kidney function and for her to increase hydration and she is scheduled for labs next week.  Tammy Choi,cma

## 2020-06-07 ENCOUNTER — Ambulatory Visit
Admission: RE | Admit: 2020-06-07 | Discharge: 2020-06-07 | Disposition: A | Discharge status: Home / Self Care | Payer: PPO | Source: Ambulatory Visit | Attending: Surgery | Admitting: Surgery

## 2020-06-07 ENCOUNTER — Ambulatory Visit
Admission: RE | Admit: 2020-06-07 | Discharge: 2020-06-07 | Disposition: A | Discharge status: Home / Self Care | Payer: PPO | Source: Ambulatory Visit | Attending: Oncology | Admitting: Oncology

## 2020-06-07 DIAGNOSIS — Z79811 Long term (current) use of aromatase inhibitors: Secondary | ICD-10-CM | POA: Diagnosis not present

## 2020-06-07 DIAGNOSIS — C50311 Malignant neoplasm of lower-inner quadrant of right female breast: Secondary | ICD-10-CM

## 2020-06-07 DIAGNOSIS — M810 Age-related osteoporosis without current pathological fracture: Secondary | ICD-10-CM | POA: Diagnosis not present

## 2020-06-07 DIAGNOSIS — M8589 Other specified disorders of bone density and structure, multiple sites: Secondary | ICD-10-CM | POA: Diagnosis not present

## 2020-06-07 DIAGNOSIS — Z170 Estrogen receptor positive status [ER+]: Secondary | ICD-10-CM | POA: Diagnosis not present

## 2020-06-07 DIAGNOSIS — Z853 Personal history of malignant neoplasm of breast: Secondary | ICD-10-CM | POA: Diagnosis not present

## 2020-06-07 DIAGNOSIS — R928 Other abnormal and inconclusive findings on diagnostic imaging of breast: Secondary | ICD-10-CM | POA: Diagnosis not present

## 2020-06-08 ENCOUNTER — Telehealth: Payer: Self-pay

## 2020-06-08 ENCOUNTER — Telehealth: Payer: Self-pay | Admitting: Emergency Medicine

## 2020-06-08 NOTE — Telephone Encounter (Signed)
Ellison Hughs, please add Zometa *new* to her next appts.  No need for you to call the patient.  Thanks  Left message on voicemail to call for results.  Have also sent a message via MyChart since there has been recent activity.

## 2020-06-08 NOTE — Telephone Encounter (Signed)
Done... *NEW* Zometa added to 09/27/20 appts

## 2020-06-08 NOTE — Telephone Encounter (Signed)
Called patient and made her aware of normal mammogram. Reminded of f/u with Dr Dahlia Byes on 06/13/20.Pt voiced understanding and has no further concerns.

## 2020-06-08 NOTE — Telephone Encounter (Signed)
-----   Message from Earlie Server, MD sent at 06/07/2020  9:55 PM EDT ----- Please let pt know that she has weak bone, I recommend bone strengthening treatment with bisphosphonate. Recommend patient to obtain dental clearance and send to [place]. Thanks.  Add Zometa to her next appt. Thanks.

## 2020-06-09 ENCOUNTER — Other Ambulatory Visit: Payer: PPO

## 2020-06-09 NOTE — Telephone Encounter (Signed)
Spoke to patient and she states that she wears dentures (no teeth). Pt voiced concern about how she might not be able to do it f insurance doesn't cover it. She was notified that Zometa was added to next visit, but she could let us know then what she wanted to do regarding proceeding.

## 2020-06-10 ENCOUNTER — Other Ambulatory Visit: Payer: Self-pay

## 2020-06-10 ENCOUNTER — Other Ambulatory Visit (INDEPENDENT_AMBULATORY_CARE_PROVIDER_SITE_OTHER): Payer: PPO

## 2020-06-10 DIAGNOSIS — N179 Acute kidney failure, unspecified: Secondary | ICD-10-CM | POA: Diagnosis not present

## 2020-06-10 LAB — BASIC METABOLIC PANEL
BUN: 29 mg/dL — ABNORMAL HIGH (ref 6–23)
CO2: 29 mEq/L (ref 19–32)
Calcium: 9.3 mg/dL (ref 8.4–10.5)
Chloride: 102 mEq/L (ref 96–112)
Creatinine, Ser: 1.28 mg/dL — ABNORMAL HIGH (ref 0.40–1.20)
GFR: 40.66 mL/min — ABNORMAL LOW (ref >60.00)
Glucose, Bld: 98 mg/dL (ref 70–99)
Potassium: 4.8 mEq/L (ref 3.5–5.1)
Sodium: 137 mEq/L (ref 135–145)

## 2020-06-13 ENCOUNTER — Ambulatory Visit: Payer: PPO | Admitting: Surgery

## 2020-06-16 ENCOUNTER — Telehealth: Payer: Self-pay | Admitting: *Deleted

## 2020-06-16 NOTE — Telephone Encounter (Signed)
Left message for patient to return call to office. 

## 2020-06-16 NOTE — Telephone Encounter (Signed)
See result note.  

## 2020-06-16 NOTE — Telephone Encounter (Signed)
-----   Message from Leone Haven, MD sent at 06/16/2020  8:56 AM EDT ----- Please let the patient know that her kidney function has improved some though does remain above her baseline.  She should continue with adequate hydration.  Given her prior history of hydronephrosis I think it would be a good idea to get an ultrasound of her kidneys to evaluate for possible structural issues and then consider having her see urology again.  I can place the order for the ultrasound once you speak with her.  Thanks.

## 2020-06-16 NOTE — Telephone Encounter (Signed)
Pt called back returning your call °

## 2020-06-22 ENCOUNTER — Other Ambulatory Visit: Payer: Self-pay | Admitting: Family Medicine

## 2020-06-22 DIAGNOSIS — Z87448 Personal history of other diseases of urinary system: Secondary | ICD-10-CM

## 2020-06-22 DIAGNOSIS — R7989 Other specified abnormal findings of blood chemistry: Secondary | ICD-10-CM

## 2020-06-23 ENCOUNTER — Telehealth: Payer: Self-pay | Admitting: Family Medicine

## 2020-06-23 NOTE — Telephone Encounter (Signed)
Thank you I called pt back. Rep put the 5 th instead of the 6th.

## 2020-06-23 NOTE — Telephone Encounter (Signed)
Patient called in stated that her appointment you gave her does not match what on mychart

## 2020-06-29 ENCOUNTER — Encounter: Payer: Self-pay | Admitting: Urology

## 2020-06-29 ENCOUNTER — Other Ambulatory Visit: Payer: Self-pay | Admitting: Family Medicine

## 2020-06-29 ENCOUNTER — Ambulatory Visit: Payer: PPO | Admitting: Surgery

## 2020-06-29 ENCOUNTER — Telehealth: Payer: Self-pay | Admitting: Family Medicine

## 2020-06-29 NOTE — Telephone Encounter (Signed)
Left vm to offer apt  From Rejection Reason - Patient did not respond" [place] said on Jun 29, 2020 2:25 PM

## 2020-06-29 NOTE — Telephone Encounter (Signed)
Please call the patient and see if she is still ok seeing urology. If she is she should call them to set up an appointment.

## 2020-06-30 NOTE — Telephone Encounter (Signed)
LVM for the patient to call back to have her reschedule a appointment with urology.  Derotha Fishbaugh,cma

## 2020-06-30 NOTE — Telephone Encounter (Signed)
I spoke with the patient and informed her that the urologist office had tried to reach her and she needed to call them and schedule and she understood. Advay Volante,cma

## 2020-07-01 ENCOUNTER — Telehealth: Payer: Self-pay | Admitting: Family Medicine

## 2020-07-01 DIAGNOSIS — Z96641 Presence of right artificial hip joint: Secondary | ICD-10-CM | POA: Diagnosis not present

## 2020-07-01 DIAGNOSIS — M1611 Unilateral primary osteoarthritis, right hip: Secondary | ICD-10-CM | POA: Diagnosis not present

## 2020-07-01 MED ORDER — LISINOPRIL 40 MG PO TABS
40.0000 mg | ORAL_TABLET | Freq: Every day | ORAL | 0 refills | Status: DC
Start: 2020-07-01 — End: 2020-09-29

## 2020-07-01 NOTE — Telephone Encounter (Signed)
Pt needs a refill on lisinopril (ZESTRIL) 40 MG tablet sent to [facility]

## 2020-07-05 ENCOUNTER — Other Ambulatory Visit: Payer: Self-pay

## 2020-07-05 ENCOUNTER — Ambulatory Visit
Admission: RE | Admit: 2020-07-05 | Discharge: 2020-07-05 | Disposition: A | Discharge status: Home / Self Care | Payer: PPO | Source: Ambulatory Visit | Attending: Family Medicine | Admitting: Family Medicine

## 2020-07-05 DIAGNOSIS — N1330 Unspecified hydronephrosis: Secondary | ICD-10-CM | POA: Diagnosis not present

## 2020-07-05 DIAGNOSIS — Q6102 Congenital multiple renal cysts: Secondary | ICD-10-CM | POA: Insufficient documentation

## 2020-07-05 DIAGNOSIS — R7989 Other specified abnormal findings of blood chemistry: Secondary | ICD-10-CM | POA: Insufficient documentation

## 2020-07-05 DIAGNOSIS — N281 Cyst of kidney, acquired: Secondary | ICD-10-CM | POA: Diagnosis not present

## 2020-07-07 ENCOUNTER — Other Ambulatory Visit: Payer: Self-pay | Admitting: Family Medicine

## 2020-07-07 DIAGNOSIS — N179 Acute kidney failure, unspecified: Secondary | ICD-10-CM

## 2020-07-14 ENCOUNTER — Other Ambulatory Visit (INDEPENDENT_AMBULATORY_CARE_PROVIDER_SITE_OTHER): Payer: PPO

## 2020-07-14 ENCOUNTER — Other Ambulatory Visit: Payer: Self-pay

## 2020-07-14 DIAGNOSIS — Z23 Encounter for immunization: Secondary | ICD-10-CM

## 2020-07-14 DIAGNOSIS — N179 Acute kidney failure, unspecified: Secondary | ICD-10-CM

## 2020-07-14 LAB — BASIC METABOLIC PANEL
BUN: 44 mg/dL — ABNORMAL HIGH (ref 6–23)
CO2: 26 mEq/L (ref 19–32)
Calcium: 9.1 mg/dL (ref 8.4–10.5)
Chloride: 105 mEq/L (ref 96–112)
Creatinine, Ser: 1.58 mg/dL — ABNORMAL HIGH (ref 0.40–1.20)
GFR: 31.67 mL/min — ABNORMAL LOW (ref >60.00)
Glucose, Bld: 102 mg/dL — ABNORMAL HIGH (ref 70–99)
Potassium: 4.9 mEq/L (ref 3.5–5.1)
Sodium: 139 mEq/L (ref 135–145)

## 2020-07-15 ENCOUNTER — Other Ambulatory Visit: Payer: Self-pay | Admitting: Nurse Practitioner

## 2020-07-15 DIAGNOSIS — N179 Acute kidney failure, unspecified: Secondary | ICD-10-CM

## 2020-07-20 ENCOUNTER — Encounter: Payer: Self-pay | Admitting: Urology

## 2020-07-20 ENCOUNTER — Telehealth: Payer: Self-pay | Admitting: Oncology

## 2020-07-20 ENCOUNTER — Other Ambulatory Visit: Payer: Self-pay

## 2020-07-20 ENCOUNTER — Ambulatory Visit: Payer: PPO | Admitting: Urology

## 2020-07-20 VITALS — BP 128/72 | HR 96 | Ht 63.0 in | Wt 158.0 lb

## 2020-07-20 DIAGNOSIS — N1330 Unspecified hydronephrosis: Secondary | ICD-10-CM

## 2020-07-20 DIAGNOSIS — N1831 Chronic kidney disease, stage 3a: Secondary | ICD-10-CM

## 2020-07-20 NOTE — Progress Notes (Signed)
   07/20/2020 10:30 AM   Clemencia Course [birth year] [id number]  Reason for visit: Follow up Possible hydronephrosis, CKD  HPI: I saw Ms. Nicks and her daughter in urology clinic today.  Briefly, she is a 74 year old female who I originally saw in July 2020 for possible bilateral hydronephrosis and elevated creatinine levels.  Notably, her elevated creatinine levels of 1.3 at that time were after a diarrheal illness, and a repeat creatinine in May 2020 was normal at 0.94 with EGFR greater than 60 which was at her baseline of 0.9.  Her elevated creatinine at that time prompted a renal ultrasound which suggested mild right and mild to moderate left hydronephrosis, and a follow-up CT stone protocol was performed which showed no ureteral or renal stones however possible bilateral hydronephrosis versus extrarenal pelvis bilaterally.  When I saw her in July she was not having any kind of flank pain, gross hematuria, microscopic hematuria, or weight loss.  We opted for a CT urogram which was performed 07/06/2019 and showed prominent extrarenal pelvis in each kidney similar to June 2020 with no significant hydronephrosis or hydroureter, no filling defects.  She was rereferred back secondary to rising creatinine, and a renal ultrasound that suggested mild left hydronephrosis that was unchanged from prior CT, and medical renal disease changes of both kidneys.  I personally reviewed the prior CT scans as well as the recent renal ultrasound.  Additionally, she had a UTI and January 2021, and after being treated with antibiotic a repeat urinalysis showed no evidence of microscopic hematuria.  She has some low midline back pain, but denies any flank pain.  Her creatinine values have varied widely from 0.89-1.58 over the last 3 months.  We discussed at length the different effect on medications as well as hydration status on the renal function.  I had a long conversation again with the patient and her  daughter today, and I do not think her CT and ultrasound findings of extrarenal pelvis are related to her renal function, and additionally I think her varied creatinine values are more related to either outside medications or hydration status.  We discussed the importance of adequate hydration, and avoiding nephrotoxic medications.  We discussed that without performing a retrograde pyelogram and diagnostic ureteroscopy, I cannot 100% rule out additional pathology, but this would be very unlikely based on her prior imaging findings, symptoms, and lab work.  We reviewed cystoscopy, bilateral retrograde pyelograms, and diagnostic ureteroscopy, and would only consider this in the future if she had persistently worsening renal function, flank pain, or new hydronephrosis from her baseline extrarenal pelvis.  RTC 6 months to monitor symptoms and renal function   [place], [place] [address], [place], [place] [address] [phone]

## 2020-07-21 NOTE — Telephone Encounter (Signed)
Done Pt is sched to RTC for lab/MD on 10/28 pt is aware

## 2020-07-21 NOTE — Telephone Encounter (Signed)
Next scheduled appt is on 09/27/20.  Would you like to see her sooner?

## 2020-07-21 NOTE — Telephone Encounter (Signed)
Sure. Let me see her earlier. Next week

## 2020-07-25 ENCOUNTER — Encounter: Payer: Self-pay | Admitting: Surgery

## 2020-07-25 ENCOUNTER — Ambulatory Visit: Payer: PPO | Admitting: Surgery

## 2020-07-25 ENCOUNTER — Other Ambulatory Visit: Payer: Self-pay

## 2020-07-25 ENCOUNTER — Telehealth: Payer: Self-pay | Admitting: Emergency Medicine

## 2020-07-25 VITALS — BP 97/61 | HR 98 | Temp 98.3°F | Resp 12 | Wt 156.4 lb

## 2020-07-25 DIAGNOSIS — C50311 Malignant neoplasm of lower-inner quadrant of right female breast: Secondary | ICD-10-CM | POA: Diagnosis not present

## 2020-07-25 DIAGNOSIS — Z170 Estrogen receptor positive status [ER+]: Secondary | ICD-10-CM | POA: Diagnosis not present

## 2020-07-25 NOTE — Telephone Encounter (Signed)
Ultrasound appt: 07/28/20 at 10:40am at the [facility].   Called patient with no answer. Left vm to call the office back. Needs to be made aware of appt.

## 2020-07-25 NOTE — Telephone Encounter (Signed)
Patient called back and is aware of appointment day and time

## 2020-07-25 NOTE — Patient Instructions (Addendum)
We will schedule an Ultrasound of your Right axilla. I will call you with appointment details.   See your appointment below. Call the office if you have any questions or concerns.    Breast Self-Awareness Breast self-awareness means being familiar with how your breasts look and feel. It involves checking your breasts regularly and reporting any changes to your health care provider. Practicing breast self-awareness is important. Sometimes changes may not be harmful (are benign), but sometimes a change in your breasts can be a sign of a serious medical problem. It is important to learn how to do this procedure correctly so that you can catch problems early, when treatment is more likely to be successful. All women should practice breast self-awareness, including women who have had breast implants. What you need:  A mirror.  A well-lit room. How to do a breast self-exam A breast self-exam is one way to learn what is normal for your breasts and whether your breasts are changing. To do a breast self-exam: Look for changes  1. Remove all the clothing above your waist. 2. Stand in front of a mirror in a room with good lighting. 3. Put your hands on your hips. 4. Push your hands firmly downward. 5. Compare your breasts in the mirror. Look for differences between them (asymmetry), such as: ? Differences in shape. ? Differences in size. ? Puckers, dips, and bumps in one breast and not the other. 6. Look at each breast for changes in the skin, such as: ? Redness. ? Scaly areas. 7. Look for changes in your nipples, such as: ? Discharge. ? Bleeding. ? Dimpling. ? Redness. ? A change in position. Feel for changes Carefully feel your breasts for lumps and changes. It is best to do this while lying on your back on the floor, and again while sitting or standing in the tub or shower with soapy water on your skin. Feel each breast in the following way: 1. Place the arm on the side of the breast you  are examining above your head. 2. Feel your breast with the other hand. 3. Start in the nipple area and make -inch (2 cm) overlapping circles to feel your breast. Use the pads of your three middle fingers to do this. Apply light pressure, then medium pressure, then firm pressure. The light pressure will allow you to feel the tissue closest to the skin. The medium pressure will allow you to feel the tissue that is a little deeper. The firm pressure will allow you to feel the tissue close to the ribs. 4. Continue the overlapping circles, moving downward over the breast until you feel your ribs below your breast. 5. Move one finger-width toward the center of the body. Continue to use the -inch (2 cm) overlapping circles to feel your breast as you move slowly up toward your collarbone. 6. Continue the up-and-down exam using all three pressures until you reach your armpit.  Write down what you find Writing down what you find can help you remember what to discuss with your health care provider. Write down:  What is normal for each breast.  Any changes that you find in each breast, including: ? The kind of changes you find. ? Any pain or tenderness. ? Size and location of any lumps.  Where you are in your menstrual cycle, if you are still menstruating. General tips and recommendations  Examine your breasts every month.  If you are breastfeeding, the best time to examine your breasts is after  a feeding or after using a breast pump.  If you menstruate, the best time to examine your breasts is 5-7 days after your period. Breasts are generally lumpier during menstrual periods, and it may be more difficult to notice changes.  With time and practice, you will become more familiar with the variations in your breasts and more comfortable with the exam. Contact a health care provider if you:  See a change in the shape or size of your breasts or nipples.  See a change in the skin of your breast or  nipples, such as a reddened or scaly area.  Have unusual discharge from your nipples.  Find a lump or thick area that was not there before.  Have pain in your breasts.  Have any concerns related to your breast health. Summary  Breast self-awareness includes looking for physical changes in your breasts, as well as feeling for any changes within your breasts.  Breast self-awareness should be performed in front of a mirror in a well-lit room.  You should examine your breasts every month. If you menstruate, the best time to examine your breasts is 5-7 days after your menstrual period.  Let your health care provider know of any changes you notice in your breasts, including changes in size, changes on the skin, pain or tenderness, or unusual fluid from your nipples. This information is not intended to replace advice given to you by your health care provider. Make sure you discuss any questions you have with your health care provider. Document Revised: 05/06/2018 Document Reviewed: 05/06/2018 Elsevier Patient Education  [place].

## 2020-07-26 NOTE — Progress Notes (Signed)
Outpatient Surgical Follow Up  07/26/2020  Tammy Choi is an 74 y.o. female.   Chief Complaint  Patient presents with  . Follow-up    bil diag mammo [facility] 06/07/20    HPI: Tammy Choi is a 74 year old female with a history of right breast cancer status post lumpectomy and radiation therapy in 2019.  She is following up yearly for her mammogram and physical exam.  She denies any breast lumps.  No breast discharge.  No fevers no chills no weight loss.    Recently had a right hip replacement by Dr. Roland Rack and did very well.  She is very grateful to him . she did have a mammogram that I have personally reviewed showing some calcifications that have not progressed. Kidney function had fluctuated.  There is some concern by urology that some of her meds may be causing some of her chronic kidney insufficiency..  Past Medical History:  Diagnosis Date  . Arthritis   . Breast cancer ([place]) 05/07/2018   T1a, N0; ER/ PR positive, Her 2 neu not overexpressed.   Marland Kitchen COPD (chronic obstructive pulmonary disease) (HCC)    Not on home o2  . Depression   . HTN (hypertension)   . Hyperlipemia   . Personal history of radiation therapy     Past Surgical History:  Procedure Laterality Date  . ABDOMINAL HYSTERECTOMY    . BREAST BIOPSY Left 04/30/2013   neg core  . BREAST BIOPSY Right 05/07/2018   right breast stereo x clip INVASIVE MAMMARY CARCINOMA  . BREAST LUMPECTOMY Right 2019  . BREAST LUMPECTOMY WITH SENTINEL LYMPH NODE BIOPSY Right 06/16/2018   Procedure: BREAST LUMPECTOMY WITH SENTINEL LYMPH NODE BX;  Surgeon: Robert Bellow, MD;  Location: [facility] ORS;  Service: General;  Laterality: Right;  . COLONOSCOPY  2014   Dr Bary Castilla  . Left Leg Surgery    . TONSILLECTOMY    . TOTAL HIP ARTHROPLASTY Right 03/29/2020   Procedure: TOTAL HIP ARTHROPLASTY;  Surgeon: Corky Mull, MD;  Location: [facility] ORS;  Service: Orthopedics;  Laterality: Right;    Family History  Problem Relation Age of Onset  .  Kidney disease Mother        deceased 52  . Heart disease Father        deceased 29  . Breast cancer Other 59       maternal half-sister; deceased 78  . Colon cancer Neg Hx     Social History:  reports that she quit smoking about 4 years ago. Her smoking use included cigarettes. She has a 35.00 pack-year smoking history. She has never used smokeless tobacco. She reports that she does not drink alcohol and does not use drugs.  Allergies:  Allergies  Allergen Reactions  . Penicillins Anaphylaxis and Other (See Comments)    Has patient had a PCN reaction causing immediate rash, facial/tongue/throat swelling, SOB or lightheadedness with hypotension: Yes Has patient had a PCN reaction causing severe rash involving mucus membranes or skin necrosis: No Has patient had a PCN reaction that required hospitalization No Has patient had a PCN reaction occurring within the last 10 years: No If all of the above answers are "NO", then may proceed with Cephalosporin use.    Medications reviewed.    ROS Full ROS performed and is otherwise negative other than what is stated in HPI   BP 97/61   Pulse 98   Temp 98.3 F (36.8 C)   Resp 12   Wt 156 lb  6.4 oz (70.9 kg)   SpO2 96%   BMI 27.71 kg/m   Physical Exam Vitals and nursing note reviewed. Exam conducted with a chaperone present.  Constitutional:      General: She is not in acute distress.    Appearance: Normal appearance. She is normal weight.  Eyes:     General: No scleral icterus.       Right eye: No discharge.        Left eye: No discharge.  Neck:     Vascular: No carotid bruit.  Cardiovascular:     Rate and Rhythm: Normal rate and regular rhythm.  Pulmonary:     Effort: Pulmonary effort is normal. No respiratory distress.     Breath sounds: Normal breath sounds. No stridor.     Comments: BREAST: Right lumpectomy scar and sentinel lymph node biopsy scar.  There is no evidence of new masses there is no evidence of skin  ulcerations.  There are no evidence of axillary lymphadenopathy.  On the right axilla there is increase in the subcutaneous tissue questionable lipoma versus  lymph node. Abdominal:     General: Abdomen is flat. There is no distension.     Palpations: Abdomen is soft. There is no mass.     Tenderness: There is no abdominal tenderness. There is no right CVA tenderness, guarding or rebound.     Hernia: No hernia is present.  Musculoskeletal:     Cervical back: Normal range of motion and neck supple. No rigidity or tenderness.  Lymphadenopathy:     Cervical: No cervical adenopathy.  Skin:    Capillary Refill: Capillary refill takes less than 2 seconds.  Neurological:     General: No focal deficit present.     Mental Status: She is alert and oriented to person, place, and time.  Psychiatric:        Mood and Affect: Mood normal.        Behavior: Behavior normal.        Thought Content: Thought content normal.        Judgment: Judgment normal.    Assessment/Plan: 74 year old female history of breast cancer without evidence of recurrence.  She does have excess tissue in the right axilla questionable lipoma.  I would like to interrogate this with an ultrasound of the right axilla.  She does have already an appointment with Dr. Tasia Catchings from oncology regarding hormonal therapy.  I do not think that hormonal therapy is contributing to her chronic kidney insufficiency but I will let Dr. Tasia Catchings make any changes to her medications Greater than 50% of the 25 minutes  visit was spent in counseling/coordination of care   Caroleen Hamman, MD [place] Surgeon

## 2020-07-27 ENCOUNTER — Other Ambulatory Visit: Payer: Self-pay | Admitting: Surgery

## 2020-07-27 DIAGNOSIS — C50311 Malignant neoplasm of lower-inner quadrant of right female breast: Secondary | ICD-10-CM

## 2020-07-28 ENCOUNTER — Inpatient Hospital Stay: Payer: PPO | Attending: Oncology

## 2020-07-28 ENCOUNTER — Inpatient Hospital Stay: Payer: PPO | Admitting: Oncology

## 2020-07-28 ENCOUNTER — Other Ambulatory Visit: Payer: Self-pay

## 2020-07-28 ENCOUNTER — Telehealth: Payer: Self-pay

## 2020-07-28 ENCOUNTER — Encounter: Payer: Self-pay | Admitting: Oncology

## 2020-07-28 ENCOUNTER — Ambulatory Visit
Admission: RE | Admit: 2020-07-28 | Discharge: 2020-07-28 | Disposition: A | Discharge status: Home / Self Care | Payer: PPO | Source: Ambulatory Visit | Attending: Surgery | Admitting: Surgery

## 2020-07-28 VITALS — BP 128/71 | HR 79 | Temp 97.7°F | Resp 18 | Wt 157.9 lb

## 2020-07-28 DIAGNOSIS — C50311 Malignant neoplasm of lower-inner quadrant of right female breast: Secondary | ICD-10-CM

## 2020-07-28 DIAGNOSIS — N6310 Unspecified lump in the right breast, unspecified quadrant: Secondary | ICD-10-CM | POA: Diagnosis not present

## 2020-07-28 DIAGNOSIS — F329 Major depressive disorder, single episode, unspecified: Secondary | ICD-10-CM | POA: Diagnosis not present

## 2020-07-28 DIAGNOSIS — I129 Hypertensive chronic kidney disease with stage 1 through stage 4 chronic kidney disease, or unspecified chronic kidney disease: Secondary | ICD-10-CM | POA: Insufficient documentation

## 2020-07-28 DIAGNOSIS — Z79811 Long term (current) use of aromatase inhibitors: Secondary | ICD-10-CM | POA: Diagnosis not present

## 2020-07-28 DIAGNOSIS — E785 Hyperlipidemia, unspecified: Secondary | ICD-10-CM | POA: Diagnosis not present

## 2020-07-28 DIAGNOSIS — Z79899 Other long term (current) drug therapy: Secondary | ICD-10-CM | POA: Diagnosis not present

## 2020-07-28 DIAGNOSIS — M816 Localized osteoporosis [Lequesne]: Secondary | ICD-10-CM | POA: Insufficient documentation

## 2020-07-28 DIAGNOSIS — Z170 Estrogen receptor positive status [ER+]: Secondary | ICD-10-CM | POA: Diagnosis not present

## 2020-07-28 DIAGNOSIS — Z87891 Personal history of nicotine dependence: Secondary | ICD-10-CM | POA: Insufficient documentation

## 2020-07-28 DIAGNOSIS — Z7951 Long term (current) use of inhaled steroids: Secondary | ICD-10-CM | POA: Diagnosis not present

## 2020-07-28 DIAGNOSIS — Z923 Personal history of irradiation: Secondary | ICD-10-CM | POA: Insufficient documentation

## 2020-07-28 DIAGNOSIS — J449 Chronic obstructive pulmonary disease, unspecified: Secondary | ICD-10-CM | POA: Diagnosis not present

## 2020-07-28 DIAGNOSIS — M1990 Unspecified osteoarthritis, unspecified site: Secondary | ICD-10-CM | POA: Insufficient documentation

## 2020-07-28 DIAGNOSIS — N1831 Chronic kidney disease, stage 3a: Secondary | ICD-10-CM | POA: Insufficient documentation

## 2020-07-28 DIAGNOSIS — Z853 Personal history of malignant neoplasm of breast: Secondary | ICD-10-CM | POA: Diagnosis not present

## 2020-07-28 DIAGNOSIS — M810 Age-related osteoporosis without current pathological fracture: Secondary | ICD-10-CM

## 2020-07-28 HISTORY — DX: Age-related osteoporosis without current pathological fracture: M81.0

## 2020-07-28 LAB — CBC WITH DIFFERENTIAL/PLATELET
Abs Immature Granulocytes: 0.05 10*3/uL (ref 0.00–0.07)
Basophils Absolute: 0.1 10*3/uL (ref 0.0–0.1)
Basophils Relative: 1 %
Eosinophils Absolute: 0.2 10*3/uL (ref 0.0–0.5)
Eosinophils Relative: 2 %
HCT: 37.6 % (ref 36.0–46.0)
Hemoglobin: 11.7 g/dL — ABNORMAL LOW (ref 12.0–15.0)
Immature Granulocytes: 1 %
Lymphocytes Relative: 31 %
Lymphs Abs: 2.6 10*3/uL (ref 0.7–4.0)
MCH: 26.7 pg (ref 26.0–34.0)
MCHC: 31.1 g/dL (ref 30.0–36.0)
MCV: 85.8 fL (ref 80.0–100.0)
Monocytes Absolute: 0.7 10*3/uL (ref 0.1–1.0)
Monocytes Relative: 8 %
Neutro Abs: 4.8 10*3/uL (ref 1.7–7.7)
Neutrophils Relative %: 57 %
Platelets: 348 10*3/uL (ref 150–400)
RBC: 4.38 MIL/uL (ref 3.87–5.11)
RDW: 14.7 % (ref 11.5–15.5)
WBC: 8.4 10*3/uL (ref 4.0–10.5)
nRBC: 0 % (ref 0.0–0.2)

## 2020-07-28 LAB — COMPREHENSIVE METABOLIC PANEL
ALT: 12 U/L (ref 0–44)
AST: 15 U/L (ref 15–41)
Albumin: 3.9 g/dL (ref 3.5–5.0)
Alkaline Phosphatase: 89 U/L (ref 38–126)
Anion gap: 8 (ref 5–15)
BUN: 35 mg/dL — ABNORMAL HIGH (ref 8–23)
CO2: 28 mmol/L (ref 22–32)
Calcium: 9.2 mg/dL (ref 8.9–10.3)
Chloride: 100 mmol/L (ref 98–111)
Creatinine, Ser: 1.22 mg/dL — ABNORMAL HIGH (ref 0.44–1.00)
GFR, Estimated: 46 mL/min — ABNORMAL LOW (ref >60)
Glucose, Bld: 107 mg/dL — ABNORMAL HIGH (ref 70–99)
Potassium: 5.3 mmol/L — ABNORMAL HIGH (ref 3.5–5.1)
Sodium: 136 mmol/L (ref 135–145)
Total Bilirubin: 0.5 mg/dL (ref 0.3–1.2)
Total Protein: 7.6 g/dL (ref 6.5–8.1)

## 2020-07-28 NOTE — Progress Notes (Signed)
Patient here for follow up. Pt reports that she will have a [place] of right axillary lump today, ordered by Dr. Dahlia Byes.

## 2020-07-28 NOTE — Telephone Encounter (Signed)
-----   Message from Jules Husbands, MD sent at 07/28/2020  3:08 PM EDT ----- Please let her know ultrasound was completely normal ----- Message ----- From: Interface, Rad Results In Sent: 07/28/2020   1:10 PM EDT To: Jules Husbands, MD

## 2020-07-28 NOTE — Telephone Encounter (Signed)
Patient notified about recent "normal" ultrasound results. Patient was informed to keep her follow up appointment with Dr.Pabon on 09/05/20 at 9:45am. Patient was advised to give our office a call if she has any questions or concerns before scheduled appointment. Patient verbalized understanding and has no further questions.

## 2020-07-28 NOTE — Progress Notes (Signed)
Hematology/Oncology follow up note [facility] Telephone:([phone]) [phone] Fax:([phone]) [phone]   Patient Care Team: Leone Haven, MD as PCP - General (Family Medicine) Bary Castilla, [name], MD (General Surgery)  REFERRING PROVIDER: Dr. Bary Castilla REASON FOR VISIT:  Evaluation of breast cancer  HISTORY OF PRESENTING ILLNESS:  Tammy Choi is a  74 y.o.  female with PMH listed below who was referred to me for evaluation of newly diagnosed breast cancer. Patient had mammogram 05/01/2018 which showed indeterminate microcalcification in the medial right breast measuring 0.9 x 0.7 x 0.7 cm Biopsy pathology showed: Invasive mammary carcinoma, DCIS, calcification associated with DCIS, grade 2, ER positive PR negative, HER-2 negative (IHC 1+)  Nipple discharge: Denies Family history: Sister passed away from breast cancer History of radiation to chest: denies.  Previous breast surgery: Left breast biopsy with benign etiology. . 06/16/2018, right breast lumpectomy and sentinel lymph node biopsy showed residual invasive mammary carcinoma, residual ductal carcinoma in situ, biopsy site changes and metallic clip.  Incidental 5 mm fibroadenoma.  All 3 sentinel lymph node negative. ER positive HER2 negative.  Patient underwent adjuvant radiation finished in January 2020. Started aromatase inhibitor with Arimidex 1 mg daily since 1/20/ 2020.  # Tammy Choi followed up with urology Dr. Diamantina [place] for bilateral hydronephrosis. Patient was recommended to have CT urogram done for evaluation of filling defects with monitoring kidney function versus more invasive investigation with cystoscopy, bilateral retrograde pyelograms and possible ureteroscopy possible biopsy.Tammy Choi choose to have a image surveillance with monitoring of kidney function.  INTERVAL HISTORY Tammy Choi is a 74 y.o. female who has above history reviewed by me today presents for follow up visit for management of Stage IA  Breast cancer, Estrogen receptor positive, HER 2 negative.   Patient takes Arimidex  Since Jan 2020.  Her kidney function has declined during the past year.  Chronic bilateral hydronephrosis, Tammy Choi has had work up with Urology and no obstruction has been found.  Patient wants further discussion to if Arimidex could have worsened her kidney function.  Tammy Choi also recently was seen by Dr. Dahlia Byes Tammy Choi has a right axillary area which has increased subcutaneous tissue and surgery has ordered ultrasound for further evaluation.  Review of Systems  Constitutional: Negative for chills, fever, malaise/fatigue and weight loss.  HENT: Negative for nosebleeds and sore throat.   Eyes: Negative for double vision, photophobia and redness.  Respiratory: Negative for cough, shortness of breath and wheezing.   Cardiovascular: Negative for chest pain, palpitations, orthopnea and leg swelling.  Gastrointestinal: Negative for abdominal pain, blood in stool, nausea and vomiting.  Genitourinary: Negative for dysuria.  Musculoskeletal: Negative for back pain, myalgias and neck pain.       Right hip replacement.   Skin: Negative for itching and rash.  Neurological: Negative for dizziness, tingling and tremors.  Endo/Heme/Allergies: Negative for environmental allergies. Does not bruise/bleed easily.  Psychiatric/Behavioral: Negative for depression and hallucinations. The patient is not nervous/anxious.     MEDICAL HISTORY:  Past Medical History:  Diagnosis Date  . Arthritis   . Breast cancer ([place]) 05/07/2018   T1a, N0; ER/ PR positive, Her 2 neu not overexpressed.   Marland Kitchen COPD (chronic obstructive pulmonary disease) (HCC)    Not on home o2  . Depression   . HTN (hypertension)   . Hyperlipemia   . Personal history of radiation therapy     SURGICAL HISTORY: Past Surgical History:  Procedure Laterality Date  . ABDOMINAL HYSTERECTOMY    . BREAST  BIOPSY Left 04/30/2013   neg core  . BREAST BIOPSY Right 05/07/2018    right breast stereo x clip INVASIVE MAMMARY CARCINOMA  . BREAST LUMPECTOMY Right 2019  . BREAST LUMPECTOMY WITH SENTINEL LYMPH NODE BIOPSY Right 06/16/2018   Procedure: BREAST LUMPECTOMY WITH SENTINEL LYMPH NODE BX;  Surgeon: Robert Bellow, MD;  Location: [facility] ORS;  Service: General;  Laterality: Right;  . COLONOSCOPY  2014   Dr Bary Castilla  . Left Leg Surgery    . TONSILLECTOMY    . TOTAL HIP ARTHROPLASTY Right 03/29/2020   Procedure: TOTAL HIP ARTHROPLASTY;  Surgeon: Corky Mull, MD;  Location: [facility] ORS;  Service: Orthopedics;  Laterality: Right;    SOCIAL HISTORY: Social History   Socioeconomic History  . Marital status: Divorced    Spouse name: Not on file  . Number of children: Not on file  . Years of education: Not on file  . Highest education level: Not on file  Occupational History  . Occupation: Retired Database administrator: retired  Tobacco Use  . Smoking status: Former Smoker    Packs/day: 1.00    Years: 35.00    Pack years: 35.00    Types: Cigarettes    Quit date: 12/20/2015    Years since quitting: 4.6  . Smokeless tobacco: Never Used  Vaping Use  . Vaping Use: Never used  Substance and Sexual Activity  . Alcohol use: No    Alcohol/week: 0.0 standard drinks  . Drug use: No  . Sexual activity: Not on file  Other Topics Concern  . Not on file  Social History Narrative   Lives with daughter at home. Independent at baseline.   Social Determinants of Health   Financial Resource Strain:   . Difficulty of Paying Living Expenses: Not on file  Food Insecurity:   . Worried About Charity fundraiser in the Last Year: Not on file  . Ran Out of Food in the Last Year: Not on file  Transportation Needs:   . Lack of Transportation (Medical): Not on file  . Lack of Transportation (Non-Medical): Not on file  Physical Activity:   . Days of Exercise per Week: Not on file  . Minutes of Exercise per Session: Not on file  Stress:   . Feeling of Stress : Not  on file  Social Connections:   . Frequency of Communication with Friends and Family: Not on file  . Frequency of Social Gatherings with Friends and Family: Not on file  . Attends Religious Services: Not on file  . Active Member of Clubs or Organizations: Not on file  . Attends Archivist Meetings: Not on file  . Marital Status: Not on file  Intimate Partner Violence:   . Fear of Current or Ex-Partner: Not on file  . Emotionally Abused: Not on file  . Physically Abused: Not on file  . Sexually Abused: Not on file    FAMILY HISTORY: Family History  Problem Relation Age of Onset  . Kidney disease Mother        deceased 70  . Heart disease Father        deceased 57  . Breast cancer Other 70       maternal half-sister; deceased 59  . Colon cancer Neg Hx     ALLERGIES:  is allergic to penicillins.  MEDICATIONS:  Current Outpatient Medications  Medication Sig Dispense Refill  . acetaminophen (TYLENOL) 500 MG tablet Take 500-1,000 mg by mouth 2 (  two) times daily as needed for moderate pain or headache.    . anastrozole (ARIMIDEX) 1 MG tablet Take 1 tablet (1 mg total) by mouth daily. 90 tablet 1  . budesonide-formoterol (SYMBICORT) 160-4.5 MCG/ACT inhaler Inhale 2 puffs by mouth twice daily 33 g 0  . FLUoxetine (PROZAC) 40 MG capsule Take 1 capsule by mouth at bedtime 90 capsule 0  . hydrochlorothiazide (HYDRODIURIL) 12.5 MG tablet Take 1 tablet by mouth once daily 90 tablet 0  . lisinopril (ZESTRIL) 40 MG tablet Take 1 tablet (40 mg total) by mouth daily. 90 tablet 0  . PROAIR HFA 108 (90 Base) MCG/ACT inhaler INHALE 2 PUFFS BY MOUTH EVERY 6 HOURS AS NEEDED FOR WHEEZING OR SHORTNESS OF BREATH (Patient taking differently: Inhale 1-2 puffs into the lungs every 6 (six) hours as needed for wheezing or shortness of breath. ) 9 g 0  . rosuvastatin (CRESTOR) 5 MG tablet Take 1 tablet by mouth once daily 90 tablet 0   No current facility-administered medications for this visit.      PHYSICAL EXAMINATION: ECOG PERFORMANCE STATUS: 0 - Asymptomatic Vitals:   07/28/20 0944  BP: 128/71  Pulse: 79  Resp: 18  Temp: 97.7 F (36.5 C)   Filed Weights   07/28/20 0944  Weight: 157 lb 14.4 oz (71.6 kg)    Physical Exam Constitutional:      General: Tammy Choi is not in acute distress. HENT:     Head: Normocephalic and atraumatic.  Eyes:     General: No scleral icterus.    Pupils: Pupils are equal, round, and reactive to light.  Cardiovascular:     Rate and Rhythm: Normal rate and regular rhythm.     Heart sounds: Normal heart sounds.  Pulmonary:     Effort: Pulmonary effort is normal. No respiratory distress.     Breath sounds: No wheezing.  Abdominal:     General: Bowel sounds are normal. There is no distension.     Palpations: Abdomen is soft. There is no mass.     Tenderness: There is no abdominal tenderness.  Musculoskeletal:        General: No deformity. Normal range of motion.     Cervical back: Normal range of motion and neck supple.  Skin:    General: Skin is warm and dry.     Findings: No erythema or rash.  Neurological:     Mental Status: Tammy Choi is alert and oriented to person, place, and time.     Cranial Nerves: No cranial nerve deficit.     Coordination: Coordination normal.  Psychiatric:        Behavior: Behavior normal.        Thought Content: Thought content normal.       LABORATORY DATA:  I have reviewed the data as listed Lab Results  Component Value Date   WBC 8.4 07/28/2020   HGB 11.7 (L) 07/28/2020   HCT 37.6 07/28/2020   MCV 85.8 07/28/2020   PLT 348 07/28/2020   Recent Labs    10/19/19 1542 03/25/20 1128 03/31/20 0510 03/31/20 0510 04/01/20 0605 04/01/20 0605 05/30/20 1304 05/30/20 1304 06/10/20 1055 07/14/20 1100 07/28/20 0923  NA 138   < > 136   < > 136   < > 139   < > 137 139 136  K 4.4   < > 4.9   < > 4.4   < > 4.6   < > 4.8 4.9 5.3*  CL 102   < > 103   < >  101   < > 105   < > 102 105 100  CO2 26   < > 25    < > 27   < > 25   < > 29 26 28   GLUCOSE 99   < > 103*   < > 103*   < > 103*   < > 98 102* 107*  BUN 34*   < > 27*   < > 18   < > 42*   < > 29* 44* 35*  CREATININE 1.09*   < > 1.13*   < > 0.89   < > 1.46*   < > 1.28* 1.58* 1.22*  CALCIUM 9.0   < > 8.5*   < > 8.6*   < > 9.0   < > 9.3 9.1 9.2  GFRNONAA 50*   < > 48*   < > >60  --  35*  --   --   --  46*  GFRAA 58*   < > 55*  --  >60  --  41*  --   --   --   --   PROT 7.3  --   --   --   --   --  7.1  --   --   --  7.6  ALBUMIN 3.7  --   --   --   --   --  3.8  --   --   --  3.9  AST 14*  --   --   --   --   --  15  --   --   --  15  ALT 12  --   --   --   --   --  13  --   --   --  12  ALKPHOS 69  --   --   --   --   --  77  --   --   --  89  BILITOT 0.4  --   --   --   --   --  0.3  --   --   --  0.5   < > = values in this interval not displayed.   Iron/TIBC/Ferritin/ %Sat No results found for: IRON, TIBC, FERRITIN, IRONPCTSAT   RADIOGRAPHIC STUDIES: I have personally reviewed the radiological images as listed and agreed with the findings in the report.   ASSESSMENT & PLAN:  1. Malignant neoplasm of lower-inner quadrant of right breast of female, estrogen receptor positive ([place])   2. Aromatase inhibitor use   3. Stage 3a chronic kidney disease ([place])   4. Localized osteoporosis without current pathological fracture   Cancer Staging Malignant neoplasm of lower-inner quadrant of right breast of female, estrogen receptor positive ([place]) Staging form: Breast, AJCC 8th Edition - Clinical: No stage assigned - Unsigned - Pathologic stage from 09/29/2018: Stage IA (pT1a, pN0, cM0, G2, ER+, PR-, HER2-) - Signed by Earlie Server, MD on 09/29/2018  Stage IA breast cancer ER positive, PR negative, HER-2 negative. Clinically doing well. Reviewed Arimidex side effects in details with patient. Impairment of kidney function is possible but a very rare complication of Arimidex. Worsening of kidney function can be due to other common etiologies.  Arimidex  does not cause hydronephrosis. I recommend patient to establish care with nephrologist for further evaluation. I have checked a multiple myeloma panel in light chain ratio.  No M protein was detected.  Free light chain ratio is slightly elevated, likely  due to impaired kidney function. I discussed about the option of temporarily holding Arimidex to see if kidney function would improve. Patient prefers to continue Arimidex for now. Tammy Choi will discuss with Dr. Caryl Bis for referral to nephrology. Recommend patient to continue calcium and vitamin D supplementation  Labs reviewed and discussed with patient.  Potassium is 5.3.  Recommend patient to avoid potassium rich food and follow-up with PCP.  Osteoporosis, Tammy Choi has full mouth dentures.  Discussed about the rationale of prolia.  Plan prolia at the next visit.  All questions were answered. The patient knows to call the clinic with any problems questions or concerns. Return of visit: 3 months  Earlie Server, MD, PhD Hematology Oncology [facility] at [facility] Pager- [phone] 07/28/2020

## 2020-07-29 ENCOUNTER — Telehealth: Payer: Self-pay

## 2020-07-29 NOTE — Telephone Encounter (Signed)
Please change next visit's injection to Prolia *new* per MD request.

## 2020-07-29 NOTE — Telephone Encounter (Signed)
-----   Message from Earlie Server, MD sent at 07/28/2020 10:25 PM EDT ----- Sorry that I made a mistake. Next visit should be prolia instead of xgeva. Thanks.

## 2020-07-29 NOTE — Telephone Encounter (Signed)
Done... Injection has been changed to [employer] as requested

## 2020-08-01 ENCOUNTER — Telehealth: Payer: Self-pay

## 2020-08-01 DIAGNOSIS — N1831 Chronic kidney disease, stage 3a: Secondary | ICD-10-CM

## 2020-08-01 NOTE — Telephone Encounter (Signed)
Pt states that Dr. Tasia Catchings wants Dr. Caryl Bis to pick a nephrologist to see. She states that there are three in [place] that she can go to. Please advise  Tammy Choi,cma

## 2020-08-01 NOTE — Telephone Encounter (Signed)
Pt states that Dr. Tasia Catchings wants Dr. Caryl Bis to pick a nephrologist to see. She states that there are three in [place] that she can go to. Please advise

## 2020-08-02 NOTE — Telephone Encounter (Signed)
Referral placed.

## 2020-08-02 NOTE — Addendum Note (Signed)
Addended by: Caryl Bis, Peretz Thieme G on: 08/02/2020 03:06 PM   Modules accepted: Orders

## 2020-08-17 ENCOUNTER — Other Ambulatory Visit: Payer: Self-pay | Admitting: Family Medicine

## 2020-09-05 ENCOUNTER — Ambulatory Visit: Payer: Self-pay | Admitting: Surgery

## 2020-09-08 DIAGNOSIS — N1330 Unspecified hydronephrosis: Secondary | ICD-10-CM | POA: Diagnosis not present

## 2020-09-08 DIAGNOSIS — I10 Essential (primary) hypertension: Secondary | ICD-10-CM | POA: Diagnosis not present

## 2020-09-08 DIAGNOSIS — N1831 Chronic kidney disease, stage 3a: Secondary | ICD-10-CM | POA: Diagnosis not present

## 2020-09-27 ENCOUNTER — Ambulatory Visit: Payer: PPO

## 2020-09-27 ENCOUNTER — Other Ambulatory Visit: Payer: PPO

## 2020-09-27 ENCOUNTER — Ambulatory Visit: Payer: PPO | Admitting: Oncology

## 2020-09-29 ENCOUNTER — Other Ambulatory Visit: Payer: Self-pay | Admitting: Family Medicine

## 2020-10-07 ENCOUNTER — Other Ambulatory Visit: Payer: Self-pay

## 2020-10-07 DIAGNOSIS — Z170 Estrogen receptor positive status [ER+]: Secondary | ICD-10-CM

## 2020-10-07 DIAGNOSIS — C50311 Malignant neoplasm of lower-inner quadrant of right female breast: Secondary | ICD-10-CM

## 2020-10-10 ENCOUNTER — Inpatient Hospital Stay: Payer: PPO

## 2020-10-10 ENCOUNTER — Inpatient Hospital Stay: Payer: PPO | Admitting: Oncology

## 2020-10-10 ENCOUNTER — Telehealth: Payer: Self-pay | Admitting: Oncology

## 2020-10-10 NOTE — Telephone Encounter (Signed)
Done.. Pts 10/10/20 appts has been R/S to 10/14/20. I was unable to reach pt  by phone However, A detailed message was left making her aware of the NEW sched appt and to contact the office back to let us know if the NEW sched date and time works for her.

## 2020-10-10 NOTE — Telephone Encounter (Signed)
Patient called to cancel appointment for 10/10/20 patient stated she is sick and cannot make it .

## 2020-10-10 NOTE — Telephone Encounter (Signed)
Ellison Hughs, please contact pt and r/s.

## 2020-10-12 ENCOUNTER — Ambulatory Visit: Payer: Self-pay | Admitting: Surgery

## 2020-10-14 ENCOUNTER — Inpatient Hospital Stay (HOSPITAL_BASED_OUTPATIENT_CLINIC_OR_DEPARTMENT_OTHER): Payer: PPO | Admitting: Oncology

## 2020-10-14 ENCOUNTER — Inpatient Hospital Stay: Payer: PPO

## 2020-10-14 ENCOUNTER — Other Ambulatory Visit: Payer: Self-pay

## 2020-10-14 ENCOUNTER — Inpatient Hospital Stay: Payer: PPO | Attending: Oncology

## 2020-10-14 ENCOUNTER — Encounter: Payer: Self-pay | Admitting: Oncology

## 2020-10-14 VITALS — BP 107/58 | HR 88 | Temp 96.1°F | Resp 16 | Wt 147.8 lb

## 2020-10-14 VITALS — BP 124/70 | HR 88 | Temp 98.2°F | Resp 16

## 2020-10-14 DIAGNOSIS — R634 Abnormal weight loss: Secondary | ICD-10-CM | POA: Insufficient documentation

## 2020-10-14 DIAGNOSIS — E871 Hypo-osmolality and hyponatremia: Secondary | ICD-10-CM | POA: Diagnosis not present

## 2020-10-14 DIAGNOSIS — M818 Other osteoporosis without current pathological fracture: Secondary | ICD-10-CM

## 2020-10-14 DIAGNOSIS — M810 Age-related osteoporosis without current pathological fracture: Secondary | ICD-10-CM | POA: Diagnosis not present

## 2020-10-14 DIAGNOSIS — C50311 Malignant neoplasm of lower-inner quadrant of right female breast: Secondary | ICD-10-CM | POA: Insufficient documentation

## 2020-10-14 DIAGNOSIS — Z170 Estrogen receptor positive status [ER+]: Secondary | ICD-10-CM | POA: Diagnosis not present

## 2020-10-14 DIAGNOSIS — Z79811 Long term (current) use of aromatase inhibitors: Secondary | ICD-10-CM | POA: Insufficient documentation

## 2020-10-14 DIAGNOSIS — Z87891 Personal history of nicotine dependence: Secondary | ICD-10-CM | POA: Diagnosis not present

## 2020-10-14 DIAGNOSIS — N1831 Chronic kidney disease, stage 3a: Secondary | ICD-10-CM | POA: Diagnosis not present

## 2020-10-14 LAB — COMPREHENSIVE METABOLIC PANEL
ALT: 21 U/L (ref 0–44)
AST: 28 U/L (ref 15–41)
Albumin: 4.1 g/dL (ref 3.5–5.0)
Alkaline Phosphatase: 83 U/L (ref 38–126)
Anion gap: 9 (ref 5–15)
BUN: 21 mg/dL (ref 8–23)
CO2: 27 mmol/L (ref 22–32)
Calcium: 9.3 mg/dL (ref 8.9–10.3)
Chloride: 83 mmol/L — ABNORMAL LOW (ref 98–111)
Creatinine, Ser: 1.17 mg/dL — ABNORMAL HIGH (ref 0.44–1.00)
GFR, Estimated: 49 mL/min — ABNORMAL LOW (ref >60)
Glucose, Bld: 112 mg/dL — ABNORMAL HIGH (ref 70–99)
Potassium: 4 mmol/L (ref 3.5–5.1)
Sodium: 119 mmol/L — CL (ref 135–145)
Total Bilirubin: 0.5 mg/dL (ref 0.3–1.2)
Total Protein: 6.9 g/dL (ref 6.5–8.1)

## 2020-10-14 LAB — CBC WITH DIFFERENTIAL/PLATELET
Abs Immature Granulocytes: 0.08 10*3/uL — ABNORMAL HIGH (ref 0.00–0.07)
Basophils Absolute: 0 10*3/uL (ref 0.0–0.1)
Basophils Relative: 0 %
Eosinophils Absolute: 0 10*3/uL (ref 0.0–0.5)
Eosinophils Relative: 0 %
HCT: 35.2 % — ABNORMAL LOW (ref 36.0–46.0)
Hemoglobin: 12.2 g/dL (ref 12.0–15.0)
Immature Granulocytes: 1 %
Lymphocytes Relative: 15 %
Lymphs Abs: 1.8 10*3/uL (ref 0.7–4.0)
MCH: 27.9 pg (ref 26.0–34.0)
MCHC: 34.7 g/dL (ref 30.0–36.0)
MCV: 80.4 fL (ref 80.0–100.0)
Monocytes Absolute: 0.8 10*3/uL (ref 0.1–1.0)
Monocytes Relative: 7 %
Neutro Abs: 9.1 10*3/uL — ABNORMAL HIGH (ref 1.7–7.7)
Neutrophils Relative %: 77 %
Platelets: 291 10*3/uL (ref 150–400)
RBC: 4.38 MIL/uL (ref 3.87–5.11)
RDW: 14.6 % (ref 11.5–15.5)
WBC: 11.9 10*3/uL — ABNORMAL HIGH (ref 4.0–10.5)
nRBC: 0 % (ref 0.0–0.2)

## 2020-10-14 MED ORDER — DENOSUMAB 60 MG/ML ~~LOC~~ SOSY
60.0000 mg | PREFILLED_SYRINGE | Freq: Once | SUBCUTANEOUS | Status: AC
  Administered 2020-10-14: 60 mg via SUBCUTANEOUS
  Filled 2020-10-14: qty 1

## 2020-10-14 MED ORDER — SODIUM CHLORIDE 0.9 % IV SOLN
Freq: Once | INTRAVENOUS | Status: AC
  Filled 2020-10-14: qty 250

## 2020-10-14 NOTE — Progress Notes (Signed)
Patient doesn't have any concerns for today

## 2020-10-14 NOTE — Progress Notes (Signed)
Hematology/Oncology follow up note [facility] [phone] [phone]   Patient Care Team: Leone Haven, MD as PCP - General (Family Medicine) Bary Castilla, [name], MD (General Surgery)  REFERRING PROVIDER: Dr. Bary Castilla REASON FOR VISIT:  Evaluation of breast cancer  HISTORY OF PRESENTING ILLNESS:  Tammy Choi is a  75 y.o.  female with PMH listed below who was referred to me for evaluation of newly diagnosed breast cancer. Patient had mammogram 05/01/2018 which showed indeterminate microcalcification in the medial right breast measuring 0.9 x 0.7 x 0.7 cm Biopsy pathology showed: Invasive mammary carcinoma, DCIS, calcification associated with DCIS, grade 2, ER positive PR negative, HER-2 negative (IHC 1+)  Nipple discharge: Denies Family history: Sister passed away from breast cancer History of radiation to chest: denies.  Previous breast surgery: Left breast biopsy with benign etiology. . 06/16/2018, right breast lumpectomy and sentinel lymph node biopsy showed residual invasive mammary carcinoma, residual ductal carcinoma in situ, biopsy site changes and metallic clip.  Incidental 5 mm fibroadenoma.  All 3 sentinel lymph node negative. ER positive HER2 negative.  Patient underwent adjuvant radiation finished in January 2020. Started aromatase inhibitor with Arimidex 1 mg daily since 1/20/ 2020.  # she followed up with urology Dr. Diamantina Providence for bilateral hydronephrosis. Patient was recommended to have CT urogram done for evaluation of filling defects with monitoring kidney function versus more invasive investigation with cystoscopy, bilateral retrograde pyelograms and possible ureteroscopy possible biopsy.she choose to have a image surveillance with monitoring of kidney function.  # Arimidex  Since Jan 2020.  #Chronic bilateral hydronephrosis, she has had work up with Urology and no obstruction has been found.  INTERVAL  HISTORY Tammy Choi is a 75 y.o. female who has above history reviewed by me today presents for follow up visit for management of Stage IA Breast cancer, Estrogen receptor positive, HER 2 negative.   She has no new complaints.   Review of Systems  Constitutional: Positive for malaise/fatigue. Negative for chills, fever and weight loss.  HENT: Negative for nosebleeds and sore throat.   Eyes: Negative for double vision, photophobia and redness.  Respiratory: Negative for cough, shortness of breath and wheezing.   Cardiovascular: Negative for chest pain, palpitations, orthopnea and leg swelling.  Gastrointestinal: Negative for abdominal pain, blood in stool, nausea and vomiting.  Genitourinary: Negative for dysuria.  Musculoskeletal: Negative for back pain, myalgias and neck pain.       Right hip replacement.   Skin: Negative for itching and rash.  Neurological: Negative for dizziness, tingling and tremors.  Endo/Heme/Allergies: Negative for environmental allergies. Does not bruise/bleed easily.  Psychiatric/Behavioral: Negative for depression and hallucinations. The patient is not nervous/anxious.     MEDICAL HISTORY:  Past Medical History:  Diagnosis Date  . Arthritis   . Breast cancer ([place]) 05/07/2018   T1a, N0; ER/ PR positive, Her 2 neu not overexpressed.   Marland Kitchen COPD (chronic obstructive pulmonary disease) (HCC)    Not on home o2  . Depression   . HTN (hypertension)   . Hyperlipemia   . Osteoporosis 07/28/2020  . Personal history of radiation therapy     SURGICAL HISTORY: Past Surgical History:  Procedure Laterality Date  . ABDOMINAL HYSTERECTOMY    . BREAST BIOPSY Left 04/30/2013   neg core  . BREAST BIOPSY Right 05/07/2018   right breast stereo x clip INVASIVE MAMMARY CARCINOMA  . BREAST LUMPECTOMY Right 2019  . BREAST LUMPECTOMY WITH SENTINEL LYMPH NODE BIOPSY Right 06/16/2018  Procedure: BREAST LUMPECTOMY WITH SENTINEL LYMPH NODE BX;  Surgeon: Robert Bellow,  MD;  Location: [facility] ORS;  Service: General;  Laterality: Right;  . COLONOSCOPY  2014   Dr Bary Castilla  . Left Leg Surgery    . TONSILLECTOMY    . TOTAL HIP ARTHROPLASTY Right 03/29/2020   Procedure: TOTAL HIP ARTHROPLASTY;  Surgeon: Corky Mull, MD;  Location: [facility] ORS;  Service: Orthopedics;  Laterality: Right;    SOCIAL HISTORY: Social History   Socioeconomic History  . Marital status: Divorced    Spouse name: Not on file  . Number of children: Not on file  . Years of education: Not on file  . Highest education level: Not on file  Occupational History  . Occupation: Retired Database administrator: retired  Tobacco Use  . Smoking status: Former Smoker    Packs/day: 1.00    Years: 35.00    Pack years: 35.00    Types: Cigarettes    Quit date: 12/20/2015    Years since quitting: 4.8  . Smokeless tobacco: Never Used  Vaping Use  . Vaping Use: Never used  Substance and Sexual Activity  . Alcohol use: No    Alcohol/week: 0.0 standard drinks  . Drug use: No  . Sexual activity: Not on file  Other Topics Concern  . Not on file  Social History Narrative   Lives with daughter at home. Independent at baseline.   Social Determinants of Health   Financial Resource Strain: Not on file  Food Insecurity: Not on file  Transportation Needs: Not on file  Physical Activity: Not on file  Stress: Not on file  Social Connections: Not on file  Intimate Partner Violence: Not on file    FAMILY HISTORY: Family History  Problem Relation Age of Onset  . Kidney disease Mother        deceased 53  . Heart disease Father        deceased 17  . Breast cancer Other 4       maternal half-sister; deceased 62  . Colon cancer Neg Hx     ALLERGIES:  is allergic to penicillins.  MEDICATIONS:  Current Outpatient Medications  Medication Sig Dispense Refill  . acetaminophen (TYLENOL) 500 MG tablet Take 500-1,000 mg by mouth as needed for moderate pain or headache.    . anastrozole  (ARIMIDEX) 1 MG tablet Take 1 tablet (1 mg total) by mouth daily. 90 tablet 1  . budesonide-formoterol (SYMBICORT) 160-4.5 MCG/ACT inhaler Inhale 2 puffs by mouth twice daily 33 g 0  . FLUoxetine (PROZAC) 40 MG capsule Take 1 capsule by mouth at bedtime 90 capsule 0  . hydrochlorothiazide (HYDRODIURIL) 12.5 MG tablet Take 1 tablet by mouth once daily 90 tablet 0  . lisinopril (ZESTRIL) 40 MG tablet Take 1 tablet by mouth once daily 90 tablet 0  . PROAIR HFA 108 (90 Base) MCG/ACT inhaler INHALE 2 PUFFS BY MOUTH EVERY 6 HOURS AS NEEDED FOR WHEEZING OR SHORTNESS OF BREATH (Patient taking differently: Inhale 1-2 puffs into the lungs as needed for wheezing or shortness of breath.) 9 g 0  . rosuvastatin (CRESTOR) 5 MG tablet Take 1 tablet by mouth once daily 90 tablet 0   No current facility-administered medications for this visit.     PHYSICAL EXAMINATION: ECOG PERFORMANCE STATUS: 0 - Asymptomatic Vitals:   10/14/20 1136  BP: (!) 107/58  Pulse: 88  Resp: 16  Temp: (!) 96.1 F (35.6 C)   Filed Weights  10/14/20 1136  Weight: 147 lb 12.8 oz (67 kg)    Physical Exam Constitutional:      General: She is not in acute distress. HENT:     Head: Normocephalic and atraumatic.  Eyes:     General: No scleral icterus.    Pupils: Pupils are equal, round, and reactive to light.  Cardiovascular:     Rate and Rhythm: Normal rate and regular rhythm.     Heart sounds: Normal heart sounds.  Pulmonary:     Effort: Pulmonary effort is normal. No respiratory distress.     Breath sounds: No wheezing.  Abdominal:     General: Bowel sounds are normal. There is no distension.     Palpations: Abdomen is soft. There is no mass.     Tenderness: There is no abdominal tenderness.  Musculoskeletal:        General: No deformity. Normal range of motion.     Cervical back: Normal range of motion and neck supple.  Skin:    General: Skin is warm and dry.     Findings: No erythema or rash.  Neurological:      Mental Status: She is alert and oriented to person, place, and time.     Cranial Nerves: No cranial nerve deficit.     Coordination: Coordination normal.  Psychiatric:        Behavior: Behavior normal.        Thought Content: Thought content normal.       LABORATORY DATA:  I have reviewed the data as listed Lab Results  Component Value Date   WBC 11.9 (H) 10/14/2020   HGB 12.2 10/14/2020   HCT 35.2 (L) 10/14/2020   MCV 80.4 10/14/2020   PLT 291 10/14/2020   Recent Labs    03/31/20 0510 04/01/20 0605 05/30/20 1304 06/10/20 1055 07/14/20 1100 07/28/20 0923 10/14/20 1105  NA 136 136 139   < > 139 136 119*  K 4.9 4.4 4.6   < > 4.9 5.3* 4.0  CL 103 101 105   < > 105 100 83*  CO2 25 27 25    < > 26 28 27   GLUCOSE 103* 103* 103*   < > 102* 107* 112*  BUN 27* 18 42*   < > 44* 35* 21  CREATININE 1.13* 0.89 1.46*   < > 1.58* 1.22* 1.17*  CALCIUM 8.5* 8.6* 9.0   < > 9.1 9.2 9.3  GFRNONAA 48* >60 35*  --   --  46* 49*  GFRAA 55* >60 41*  --   --   --   --   PROT  --   --  7.1  --   --  7.6 6.9  ALBUMIN  --   --  3.8  --   --  3.9 4.1  AST  --   --  15  --   --  15 28  ALT  --   --  13  --   --  12 21  ALKPHOS  --   --  77  --   --  89 83  BILITOT  --   --  0.3  --   --  0.5 0.5   < > = values in this interval not displayed.   Iron/TIBC/Ferritin/ %Sat No results found for: IRON, TIBC, FERRITIN, IRONPCTSAT   RADIOGRAPHIC STUDIES: I have personally reviewed the radiological images as listed and agreed with the findings in the report.   ASSESSMENT & PLAN:  1. Malignant  neoplasm of lower-inner quadrant of right breast of female, estrogen receptor positive ([place])   2. Aromatase inhibitor use   3. Stage 3a chronic kidney disease ([place])   4. Other osteoporosis without current pathological fracture   5. Loss of weight   6. Hyponatremia   Cancer Staging Malignant neoplasm of lower-inner quadrant of right breast of female, estrogen receptor positive ([place]) Staging form: Breast,  AJCC 8th Edition - Pathologic stage from 09/29/2018: Stage IA (pT1a, pN0, cM0, G2, ER+, PR-, HER2-) - Signed by Earlie Server, MD on 09/29/2018  Stage IA breast cancer ER positive, PR negative, HER-2 negative. Clinically doing well. Continue Arimidex.  06/27/20 Bilateral diagnostic mammogram showed no mammographic evidence of malignancy.   Osteoporosis, she has full mouth dentures.  Proceed with Prolia today.  Recommend calcium and vitamin D supplementation.   #Hyponatremia, she admits not drinking much fluid. Will proceed with 1L NS for hydration.  Repeat BMP in 1 week.   # Weight loss, former smoker, check CT chest wo.   All questions were answered. The patient knows to call the clinic with any problems questions or concerns. Return of visit:  6 months.    Earlie Server, MD, PhD Hematology Oncology [facility] Pager- [phone] 10/14/2020

## 2020-10-14 NOTE — Progress Notes (Signed)
Low sodium. Pt was hydrated with 1 liter of NS. Tolerated well. No complaints at discharge.

## 2020-10-21 ENCOUNTER — Inpatient Hospital Stay: Payer: PPO

## 2020-10-26 ENCOUNTER — Encounter: Payer: Self-pay | Admitting: Surgery

## 2020-10-26 ENCOUNTER — Other Ambulatory Visit: Payer: Self-pay

## 2020-10-26 ENCOUNTER — Ambulatory Visit (INDEPENDENT_AMBULATORY_CARE_PROVIDER_SITE_OTHER): Payer: PPO | Admitting: Surgery

## 2020-10-26 VITALS — BP 140/79 | HR 105 | Temp 98.4°F | Ht 63.0 in | Wt 139.0 lb

## 2020-10-26 DIAGNOSIS — Z170 Estrogen receptor positive status [ER+]: Secondary | ICD-10-CM

## 2020-10-26 DIAGNOSIS — C50311 Malignant neoplasm of lower-inner quadrant of right female breast: Secondary | ICD-10-CM | POA: Diagnosis not present

## 2020-10-26 NOTE — Patient Instructions (Addendum)
Follow up in September with a Bilateral Diagnostic Mammogram and office visit. We will send you a letter about these appointments.   Continue self breast exams. Call office for any new breast issues or concerns.

## 2020-10-27 ENCOUNTER — Ambulatory Visit: Admission: RE | Admit: 2020-10-27 | Payer: PPO | Source: Ambulatory Visit

## 2020-10-28 NOTE — Progress Notes (Signed)
Outpatient Surgical Follow Up  10/28/2020  Tammy Choi is an 75 y.o. female.   Chief Complaint  Patient presents with  . Follow-up    HPI: Tammy Choi is a 74 year old female with a history of right breast cancer status post lumpectomy and radiation therapyin 2019.She is following up given some excess on axilla that u/s showed nml tissue. She denies any breast lumps. No breast discharge. No fevers no chills no weight loss. she did have an u/s that I have personally reviewed showing no evidence of malignancy. She is otherwise doing well and her main issue is kidney insf.  Past Medical History:  Diagnosis Date  . Arthritis   . Breast cancer (Tammy Choi) 05/07/2018   T1a, N0; ER/ PR positive, Her 2 neu not overexpressed.   Marland Kitchen COPD (chronic obstructive pulmonary disease) (HCC)    Not on home o2  . Depression   . HTN (hypertension)   . Hyperlipemia   . Osteoporosis 07/28/2020  . Personal history of radiation therapy     Past Surgical History:  Procedure Laterality Date  . ABDOMINAL HYSTERECTOMY    . BREAST BIOPSY Left 04/30/2013   neg core  . BREAST BIOPSY Right 05/07/2018   right breast stereo x clip INVASIVE MAMMARY CARCINOMA  . BREAST LUMPECTOMY Right 2019  . BREAST LUMPECTOMY WITH SENTINEL LYMPH NODE BIOPSY Right 06/16/2018   Procedure: BREAST LUMPECTOMY WITH SENTINEL LYMPH NODE BX;  Surgeon: Robert Bellow, MD;  Location: [facility] ORS;  Service: General;  Laterality: Right;  . COLONOSCOPY  2014   Dr Bary Castilla  . Left Leg Surgery    . TONSILLECTOMY    . TOTAL HIP ARTHROPLASTY Right 03/29/2020   Procedure: TOTAL HIP ARTHROPLASTY;  Surgeon: Corky Mull, MD;  Location: [facility] ORS;  Service: Orthopedics;  Laterality: Right;    Family History  Problem Relation Age of Onset  . Kidney disease Mother        deceased 98  . Heart disease Father        deceased 27  . Breast cancer Other 30       maternal half-sister; deceased 2  . Colon cancer Neg Hx     Social History:   reports that she quit smoking about 4 years ago. Her smoking use included cigarettes. She has a 35.00 pack-year smoking history. She has never used smokeless tobacco. She reports that she does not drink alcohol and does not use drugs.  Allergies:  Allergies  Allergen Reactions  . Penicillins Anaphylaxis and Other (See Comments)    Has patient had a PCN reaction causing immediate rash, facial/tongue/throat swelling, SOB or lightheadedness with hypotension: Yes Has patient had a PCN reaction causing severe rash involving mucus membranes or skin necrosis: No Has patient had a PCN reaction that required hospitalization No Has patient had a PCN reaction occurring within the last 10 years: No If all of the above answers are "NO", then may proceed with Cephalosporin use.    Medications reviewed.    ROS Full ROS performed and is otherwise negative other than what is stated in HPI   BP 140/79   Pulse (!) 105   Temp 98.4 F (36.9 C)   Ht 5\' 3"  (1.6 m)   Wt 139 lb (63 kg)   SpO2 96%   BMI 24.62 kg/m   Physical Exam  Physical Exam Vitals and nursing note reviewed. Exam conducted with a chaperone present.  Constitutional:      General: She is not in acute  distress.    Appearance: Normal appearance. She is normal weight.  Eyes:     General: No scleral icterus.       Right eye: No discharge.        Left eye: No discharge.  Neck:     Vascular: No carotid bruit.  Cardiovascular:     Rate and Rhythm: Normal rate and regular rhythm.  Pulmonary:     Effort: Pulmonary effort is normal. No respiratory distress.     Breath sounds: Normal breath sounds. No stridor.     Comments: BREAST: Right lumpectomy scar and sentinel lymph node biopsy scar.  There is no evidence of new masses there is no evidence of skin ulcerations.  There are no evidence of axillary lymphadenopathy.  On the right axilla there is increase in the subcutaneous tissue  Abdominal:     General: Abdomen is flat. There is  no distension.     Palpations: Abdomen is soft. There is no mass.     Tenderness: There is no abdominal tenderness. There is no right CVA tenderness, guarding or rebound.     Hernia: No hernia is present.  Musculoskeletal:     Cervical back: Normal range of motion and neck supple. No rigidity or tenderness.  Lymphadenopathy:     Cervical: No cervical adenopathy.  Skin:    Capillary Refill: Capillary refill takes less than 2 seconds.  Neurological:     General: No focal deficit present.     Mental Status: She is alert and oriented to person, place, and time.  Psychiatric:        Mood and Affect: Mood normal.        Behavior: Behavior normal.        Thought Content: Thought content normal.        Judgment: Judgment normal.   Assessment/Plan: 75 year old female history of breast cancer without evidence of recurrence.  She does have excess tissue in the right axilla which is not pathological  We will continue yearly f/u Greater than 50% of the 25 minutes  visit was spent in counseling/coordination of care   Caroleen Hamman, MD [place] Surgeon

## 2020-11-03 DIAGNOSIS — E871 Hypo-osmolality and hyponatremia: Secondary | ICD-10-CM | POA: Diagnosis not present

## 2020-11-03 DIAGNOSIS — N1831 Chronic kidney disease, stage 3a: Secondary | ICD-10-CM | POA: Diagnosis not present

## 2020-11-03 DIAGNOSIS — I10 Essential (primary) hypertension: Secondary | ICD-10-CM | POA: Diagnosis not present

## 2020-11-04 ENCOUNTER — Ambulatory Visit: Payer: PPO | Admitting: Family Medicine

## 2020-11-14 ENCOUNTER — Other Ambulatory Visit: Payer: Self-pay

## 2020-11-18 ENCOUNTER — Ambulatory Visit: Payer: PPO | Admitting: Internal Medicine

## 2020-11-24 DIAGNOSIS — R809 Proteinuria, unspecified: Secondary | ICD-10-CM | POA: Diagnosis not present

## 2020-11-24 DIAGNOSIS — N1832 Chronic kidney disease, stage 3b: Secondary | ICD-10-CM | POA: Insufficient documentation

## 2020-11-24 DIAGNOSIS — E871 Hypo-osmolality and hyponatremia: Secondary | ICD-10-CM | POA: Diagnosis not present

## 2020-11-24 DIAGNOSIS — I10 Essential (primary) hypertension: Secondary | ICD-10-CM | POA: Diagnosis not present

## 2020-11-24 DIAGNOSIS — N2581 Secondary hyperparathyroidism of renal origin: Secondary | ICD-10-CM | POA: Diagnosis not present

## 2020-11-24 DIAGNOSIS — N1831 Chronic kidney disease, stage 3a: Secondary | ICD-10-CM | POA: Insufficient documentation

## 2020-11-30 ENCOUNTER — Other Ambulatory Visit: Payer: Self-pay | Admitting: Oncology

## 2020-11-30 ENCOUNTER — Other Ambulatory Visit: Payer: Self-pay | Admitting: Family Medicine

## 2020-12-07 ENCOUNTER — Ambulatory Visit: Payer: PPO | Admitting: Family Medicine

## 2020-12-16 ENCOUNTER — Other Ambulatory Visit: Payer: Self-pay | Admitting: Family Medicine

## 2020-12-22 DIAGNOSIS — N2581 Secondary hyperparathyroidism of renal origin: Secondary | ICD-10-CM | POA: Diagnosis not present

## 2020-12-22 DIAGNOSIS — I10 Essential (primary) hypertension: Secondary | ICD-10-CM | POA: Diagnosis not present

## 2020-12-22 DIAGNOSIS — N1831 Chronic kidney disease, stage 3a: Secondary | ICD-10-CM | POA: Diagnosis not present

## 2020-12-22 DIAGNOSIS — J449 Chronic obstructive pulmonary disease, unspecified: Secondary | ICD-10-CM | POA: Diagnosis not present

## 2020-12-30 ENCOUNTER — Ambulatory Visit: Payer: PPO | Admitting: Family Medicine

## 2021-01-12 ENCOUNTER — Other Ambulatory Visit: Payer: Self-pay | Admitting: Family Medicine

## 2021-01-18 ENCOUNTER — Ambulatory Visit: Payer: Self-pay | Admitting: Urology

## 2021-01-22 IMAGING — DX DG HIP (WITH OR WITHOUT PELVIS) 2-3V*R*
2 series · 2 of 2 positions shown · non-contrast
Comparison: None.

CLINICAL DATA: Status post right hip replacement

EXAM:
DG HIP (WITH OR WITHOUT PELVIS) 2V RIGHT

[pelvis ap]
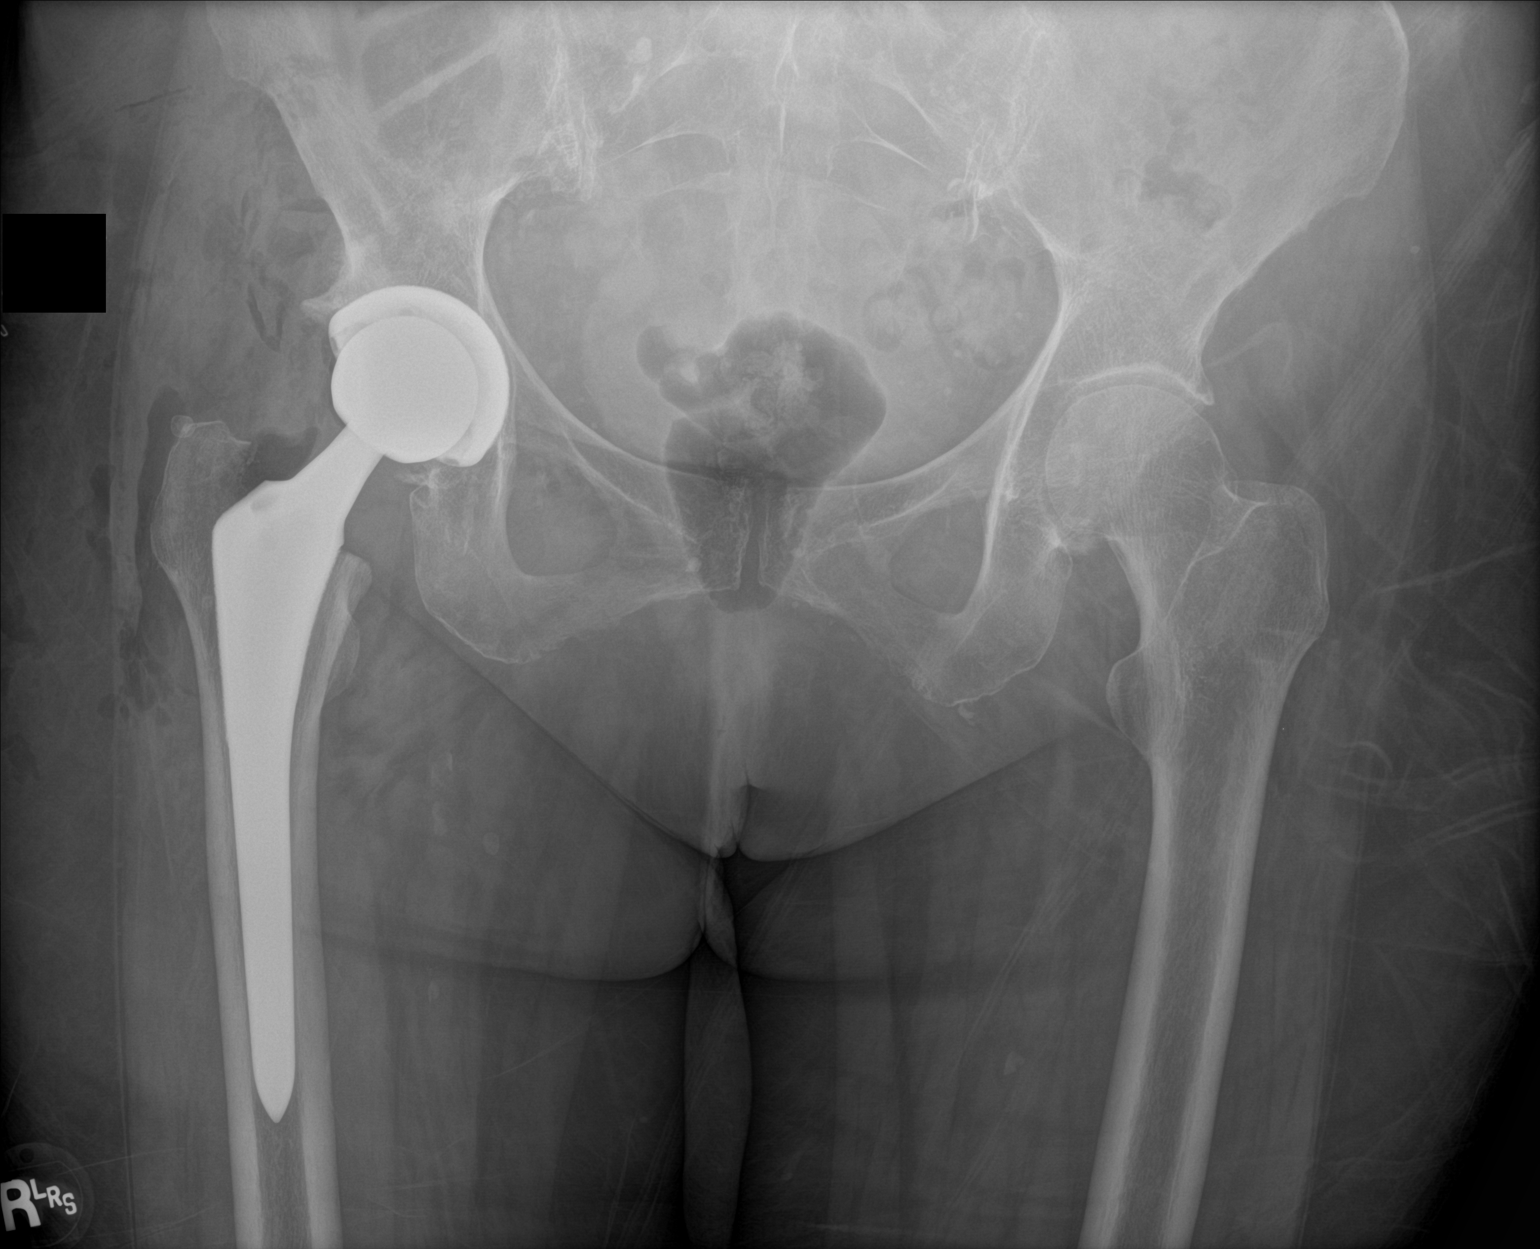

[hip lat]
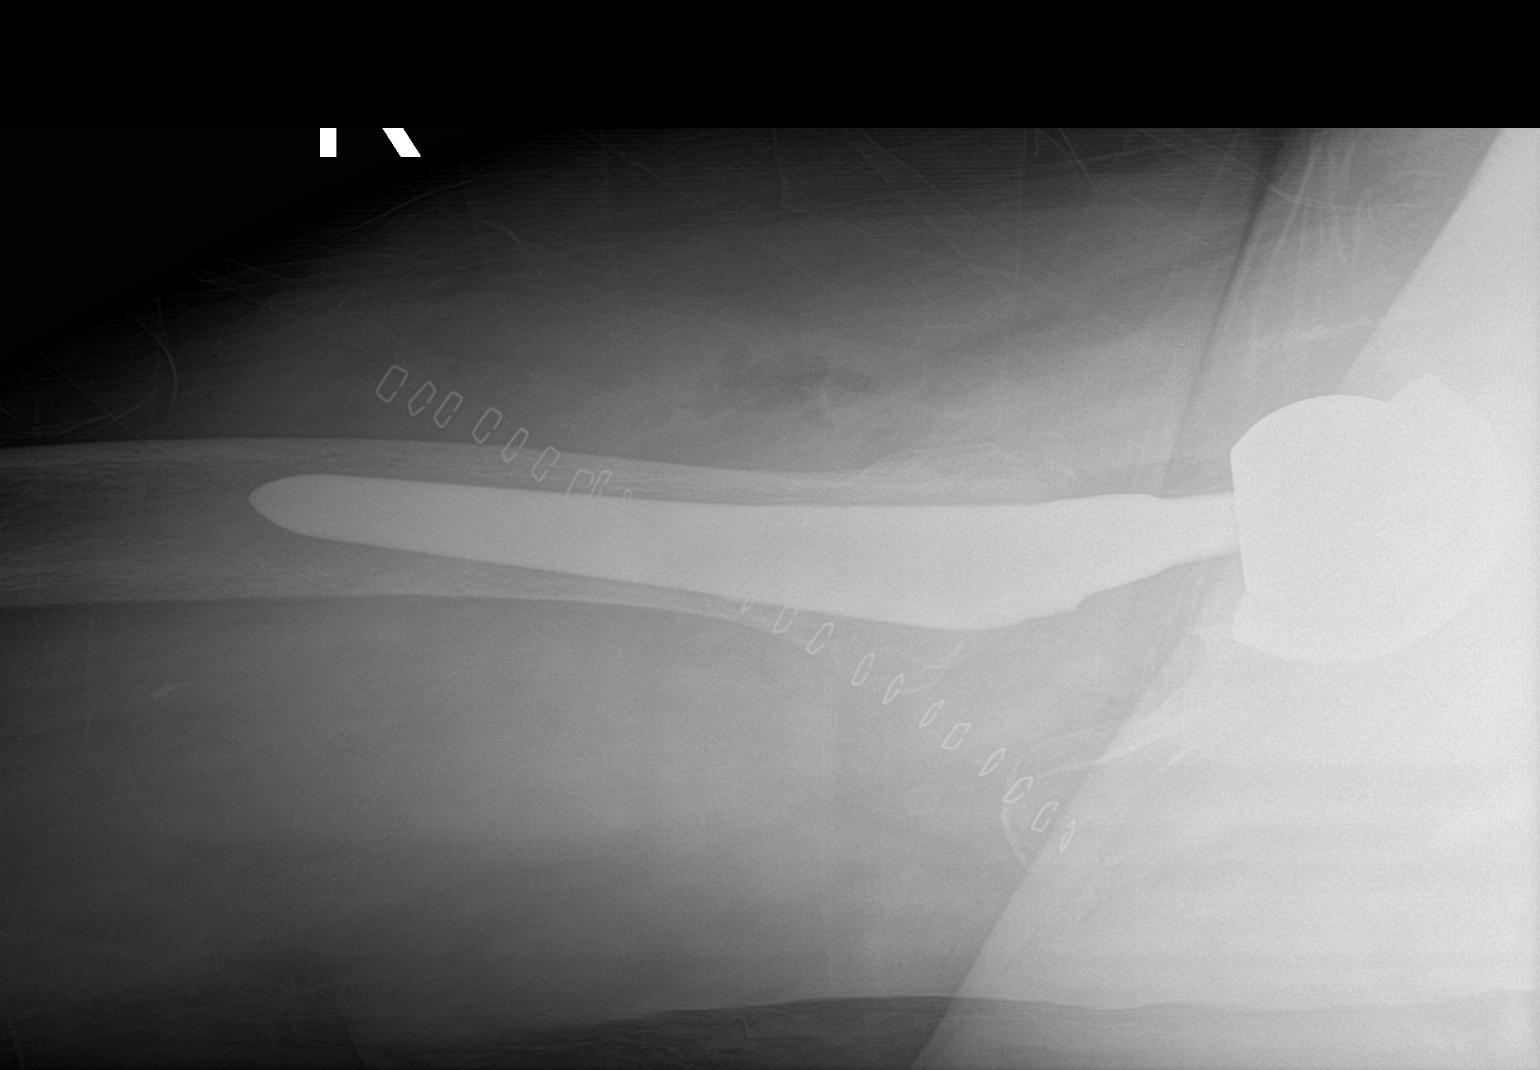

[2 of 2 positions shown; findings below may reference images not displayed]

FINDINGS: Right hip prosthesis is noted in satisfactory position. No acute
bony or soft tissue abnormality is seen.
IMPRESSION: Status post right hip replacement

## 2021-02-22 ENCOUNTER — Ambulatory Visit: Payer: PPO | Admitting: Radiation Oncology

## 2021-03-10 ENCOUNTER — Ambulatory Visit (INDEPENDENT_AMBULATORY_CARE_PROVIDER_SITE_OTHER): Payer: PPO

## 2021-03-10 ENCOUNTER — Other Ambulatory Visit: Payer: Self-pay

## 2021-03-10 VITALS — Ht 63.0 in | Wt 139.0 lb

## 2021-03-10 DIAGNOSIS — Z0000 Encounter for general adult medical examination without abnormal findings: Secondary | ICD-10-CM

## 2021-03-10 DIAGNOSIS — Z760 Encounter for issue of repeat prescription: Secondary | ICD-10-CM

## 2021-03-10 NOTE — Progress Notes (Signed)
Subjective:   Tammy Choi is a 75 y.o. female who presents for Medicare Annual (Subsequent) preventive examination.  Review of Systems    No ROS.  Medicare Wellness Virtual Visit.  Visual/audio telehealth visit, UTA vital signs.   See social history for additional risk factors.   Cardiac Risk Factors include: advanced age (>58men, >100 women);hypertension     Objective:    Today's Vitals   03/10/21 1406  Weight: 139 lb (63 kg)  Height: 5\' 3"  (1.6 m)   Body mass index is 24.62 kg/m.  Advanced Directives 03/10/2021 10/14/2020 10/14/2020 07/28/2020 05/30/2020 03/29/2020 03/21/2020  Does Patient Have a Medical Advance Directive? No No No No No No No  Would patient like information on creating a medical advance directive? No - Patient declined No - Patient declined - - - No - Patient declined No - Patient declined    Current Medications (verified) Outpatient Encounter Medications as of 03/10/2021  Medication Sig   acetaminophen (TYLENOL) 500 MG tablet Take 500-1,000 mg by mouth as needed for moderate pain or headache.   anastrozole (ARIMIDEX) 1 MG tablet Take 1 tablet by mouth once daily   budesonide-formoterol (SYMBICORT) 160-4.5 MCG/ACT inhaler Inhale 2 puffs by mouth twice daily   FLUoxetine (PROZAC) 40 MG capsule Take 1 capsule by mouth at bedtime   hydrochlorothiazide (HYDRODIURIL) 12.5 MG tablet Take 1 tablet by mouth once daily (Patient not taking: Reported on 03/10/2021)   lisinopril (ZESTRIL) 40 MG tablet Take 1 tablet by mouth once daily   PROAIR HFA 108 (90 Base) MCG/ACT inhaler INHALE 2 PUFFS BY MOUTH EVERY 6 HOURS AS NEEDED FOR WHEEZING OR SHORTNESS OF BREATH (Patient taking differently: Inhale 1-2 puffs into the lungs as needed for wheezing or shortness of breath.)   rosuvastatin (CRESTOR) 5 MG tablet Take 1 tablet by mouth once daily   No facility-administered encounter medications on file as of 03/10/2021.    Allergies (verified) Penicillins   History: Past  Medical History:  Diagnosis Date   Arthritis    Breast cancer ([place]) 05/07/2018   T1a, N0; ER/ PR positive, Her 2 neu not overexpressed.    COPD (chronic obstructive pulmonary disease) ([place])    Not on home o2   Depression    HTN (hypertension)    Hyperlipemia    Osteoporosis 07/28/2020   Personal history of radiation therapy    Past Surgical History:  Procedure Laterality Date   ABDOMINAL HYSTERECTOMY     BREAST BIOPSY Left 04/30/2013   neg core   BREAST BIOPSY Right 05/07/2018   right breast stereo x clip INVASIVE MAMMARY CARCINOMA   BREAST LUMPECTOMY Right 2019   BREAST LUMPECTOMY WITH SENTINEL LYMPH NODE BIOPSY Right 06/16/2018   Procedure: BREAST LUMPECTOMY WITH SENTINEL LYMPH NODE BX;  Surgeon: Robert Bellow, MD;  Location: [facility] ORS;  Service: General;  Laterality: Right;   COLONOSCOPY  2014   Dr Bary Castilla   Left Leg Surgery     TONSILLECTOMY     TOTAL HIP ARTHROPLASTY Right 03/29/2020   Procedure: TOTAL HIP ARTHROPLASTY;  Surgeon: Corky Mull, MD;  Location: [facility] ORS;  Service: Orthopedics;  Laterality: Right;   Family History  Problem Relation Age of Onset   Kidney disease Mother        deceased 68   Heart disease Father        deceased 37   Breast cancer Other 53       maternal half-sister; deceased 37   Colon cancer Neg Hx  Social History   Socioeconomic History   Marital status: Divorced    Spouse name: Not on file   Number of children: Not on file   Years of education: Not on file   Highest education level: Not on file  Occupational History   Occupation: Retired Database administrator: retired  Tobacco Use   Smoking status: Former    Packs/day: 1.00    Years: 35.00    Pack years: 35.00    Types: Cigarettes    Quit date: 12/20/2015    Years since quitting: 5.2   Smokeless tobacco: Never  Vaping Use   Vaping Use: Never used  Substance and Sexual Activity   Alcohol use: No    Alcohol/week: 0.0 standard drinks   Drug use: No   Sexual  activity: Not on file  Other Topics Concern   Not on file  Social History Narrative   Lives with daughter at home. Independent at baseline.   Social Determinants of Health   Financial Resource Strain: Low Risk    Difficulty of Paying Living Expenses: Not hard at all  Food Insecurity: No Food Insecurity   Worried About Charity fundraiser in the Last Year: Never true   [place] in the Last Year: Never true  Transportation Needs: No Transportation Needs   Lack of Transportation (Medical): No   Lack of Transportation (Non-Medical): No  Physical Activity: Not on file  Stress: No Stress Concern Present   Feeling of Stress : Not at all  Social Connections: Unknown   Frequency of Communication with Friends and Family: More than three times a week   Frequency of Social Gatherings with Friends and Family: Not on file   Attends Religious Services: Not on file   Active Member of Clubs or Organizations: Not on file   Attends Archivist Meetings: Not on file   Marital Status: Not on file    Tobacco Counseling Counseling given: Not Answered   Clinical Intake: complete          Diabetes: No  How often do you need to have someone help you when you read instructions, pamphlets, or other written materials from your doctor or pharmacy?: 1 - Never  Interpreter Needed?: No      Activities of Daily Living In your present state of health, do you have any difficulty performing the following activities: 03/10/2021 03/21/2020  Hearing? N N  Vision? N N  Difficulty concentrating or making decisions? N Y  Comment - sometimes remembering somthings  Walking or climbing stairs? N Y  Dressing or bathing? N N  Doing errands, shopping? Y N  Preparing Food and eating ? N -  Comment Daughter assist with meal prep as needed. Self feed. -  Using the Toilet? N -  In the past six months, have you accidently leaked urine? N -  Do you have problems with loss of bowel control? N -   Managing your Medications? N -  Managing your Finances? Y -  Comment Daughter assist -  Housekeeping or managing your Housekeeping? N -  Some recent data might be hidden    Patient Care Team: Leone Haven, MD as PCP - General (Family Medicine) Bary Castilla, [name], MD (General Surgery)  Indicate any recent Medical Services you may have received from other than [facility] providers in the past year (date may be approximate).     Assessment:   This is a routine wellness examination for Tammy Choi.  I connected with Tammy Choi today by telephone and verified that I am speaking with the correct person using two identifiers. Location patient: home Location provider: work Persons participating in the virtual visit: patient, Marine scientist.    I discussed the limitations, risks, security and privacy concerns of performing an evaluation and management service by telephone and the availability of in person appointments. The patient expressed understanding and verbally consented to this telephonic visit.    Interactive audio and video telecommunications were attempted between this provider and patient, however failed, due to patient having technical difficulties OR patient did not have access to video capability.  We continued and completed visit with audio only.  Some vital signs may be absent or patient reported.   Hearing/Vision screen Hearing Screening - Comments:: Patient is able to hear conversational tones without difficulty.  No issues reported. Vision Screening - Comments:: Wears corrective lenses Visual acuity not assessed, virtual visit.  They have seen their ophthalmologist in the last 12 months.    Dietary issues and exercise activities discussed: Current Exercise Habits: Home exercise routine, Type of exercise: walking, Intensity: Mild  Healthy diet   Goals Addressed               This Visit's Progress     Patient Stated     Follow up with Primary Care Provider (pt-stated)         As needed        Depression Screen PHQ 2/9 Scores 03/10/2021 03/09/2020 10/23/2019 06/01/2019 03/03/2019 10/10/2018 04/13/2016  PHQ - 2 Score 0 0 0 0 0 0 0    Fall Risk Fall Risk  03/10/2021 03/09/2020 01/18/2020 06/29/2019 06/01/2019  Falls in the past year? 0 0 0 0 0  Number falls in past yr: 0 0 0 - 0  Injury with Fall? 0 - 0 - 0  Risk for fall due to : - Impaired balance/gait - - -  Follow up Falls evaluation completed Falls evaluation completed - - -    FALL RISK PREVENTION PERTAINING TO THE HOME: Handrails in use when climbing stairs?Yes Home free of loose throw rugs in walkways, pet beds, electrical cords, etc? Yes  Adequate lighting in your home to reduce risk of falls? Yes   ASSISTIVE DEVICES UTILIZED TO PREVENT FALLS: Use of a cane, walker or w/c? No   TIMED UP AND GO: Was the test performed? No . Virtual visit.   Cognitive Function:  Patient is alert and oriented x3.   6CIT Screen 03/10/2021 03/09/2020 03/03/2019  What Year? 0 points 0 points 0 points  What month? 0 points 0 points 0 points  What time? 0 points - 0 points  Count back from 20 - - 0 points  Months in reverse 0 points 0 points 0 points  Repeat phrase - 4 points 0 points  Total Score - - 0    Immunizations Immunization History  Administered Date(s) Administered   Fluad Quad(high Dose 65+) 06/01/2019, 07/14/2020   Influenza Split 08/07/2011, 07/16/2012   Influenza, High Dose Seasonal PF 07/10/2016, 08/01/2017, 07/10/2018   Influenza,inj,Quad PF,6+ Mos 07/30/2014, 07/21/2015   Influenza-Unspecified 07/27/2013   Pneumococcal Conjugate-13 03/30/2014   Pneumococcal Polysaccharide-23 07/16/2010   Covid-19 vaccine status: Completed vaccines. Agrees to update immunization record.   Health Maintenance Health Maintenance  Topic Date Due   COVID-19 Vaccine (1) Never done   MAMMOGRAM  01/26/2021   Zoster Vaccines- Shingrix (1 of 2) 06/10/2021 (Originally 07/11/1965)   TETANUS/TDAP  03/10/2022 (Originally  07/11/1965)  Hepatitis C Screening  03/10/2022 (Originally 07/12/1964)   INFLUENZA VACCINE  05/01/2021   COLONOSCOPY (Pts 45-71yrs Insurance coverage will need to be confirmed)  05/06/2023   DEXA SCAN  Completed   PNA vac Low Risk Adult  Completed   HPV VACCINES  Aged Out    Mammogram status: Completed 07/28/20 . Repeat every year  Lung Cancer Screening: (Low Dose CT Chest recommended if Age 41-80 years, 30 pack-year currently smoking OR have quit w/in 15years.) does not qualify.   Hepatitis C Screening: declined.   Dental Screening: Recommended annual dental exams for proper oral hygiene  Community Resource Referral / Chronic Care Management: CRR required this visit?  No   CCM required this visit?  No      Plan:   Keep all routine maintenance appointments.   I have personally reviewed and noted the following in the patient's chart:   Medical and social history Use of alcohol, tobacco or illicit drugs  Current medications and supplements including opioid prescriptions.  Functional ability and status Nutritional status Physical activity Advanced directives List of other physicians Hospitalizations, surgeries, and ER visits in previous 12 months Vitals Screenings to include cognitive, depression, and falls Referrals and appointments  In addition, I have reviewed and discussed with patient certain preventive protocols, quality metrics, and best practice recommendations. A written personalized care plan for preventive services as well as general preventive health recommendations were provided to patient via mychart.     Varney Biles, LPN   [id number]/32/[id number]

## 2021-03-10 NOTE — Telephone Encounter (Signed)
Next in office follow up visit 7/19. Medications pended for approval.

## 2021-03-10 NOTE — Patient Instructions (Addendum)
Tammy Choi , Thank you for taking time to come for your Medicare Wellness Visit. I appreciate your ongoing commitment to your health goals. Please review the following plan we discussed and let me know if I can assist you in the future.   These are the goals we discussed:  Goals       Patient Stated     Follow up with Primary Care Provider (pt-stated)      As needed         This is a list of the screening recommended for you and due dates:  Health Maintenance  Topic Date Due   COVID-19 Vaccine (1) Never done   Mammogram  01/26/2021   Zoster (Shingles) Vaccine (1 of 2) 06/10/2021*   Tetanus Vaccine  03/10/2022*   Hepatitis C Screening: USPSTF Recommendation to screen - Ages 18-79 yo.  03/10/2022*   Flu Shot  05/01/2021   Colon Cancer Screening  05/06/2023   DEXA scan (bone density measurement)  Completed   Pneumonia vaccines  Completed   HPV Vaccine  Aged Out  *Topic was postponed. The date shown is not the original due date.    Advanced directives: not yet completed.  Conditions/risks identified: none new.  Follow up in one year for your annual wellness visit.   Preventive Care 7 Years and Older, Female Preventive care refers to lifestyle choices and visits with your health care provider that can promote health and wellness. What does preventive care include? A yearly physical exam. This is also called an annual well check. Dental exams once or twice a year. Routine eye exams. Ask your health care provider how often you should have your eyes checked. Personal lifestyle choices, including: Daily care of your teeth and gums. Regular physical activity. Eating a healthy diet. Avoiding tobacco and drug use. Limiting alcohol use. Practicing safe sex. Taking low-dose aspirin every day. Taking vitamin and mineral supplements as recommended by your health care provider. What happens during an annual well check? The services and screenings done by your health care  provider during your annual well check will depend on your age, overall health, lifestyle risk factors, and family history of disease. Counseling  Your health care provider may ask you questions about your: Alcohol use. Tobacco use. Drug use. Emotional well-being. Home and relationship well-being. Sexual activity. Eating habits. History of falls. Memory and ability to understand (cognition). Work and work Statistician. Reproductive health. Screening  You may have the following tests or measurements: Height, weight, and BMI. Blood pressure. Lipid and cholesterol levels. These may be checked every 5 years, or more frequently if you are over 7 years old. Skin check. Lung cancer screening. You may have this screening every year starting at age 32 if you have a 30-pack-year history of smoking and currently smoke or have quit within the past 15 years. Fecal occult blood test (FOBT) of the stool. You may have this test every year starting at age 67. Flexible sigmoidoscopy or colonoscopy. You may have a sigmoidoscopy every 5 years or a colonoscopy every 10 years starting at age 23. Hepatitis C blood test. Hepatitis B blood test. Sexually transmitted disease (STD) testing. Diabetes screening. This is done by checking your blood sugar (glucose) after you have not eaten for a while (fasting). You may have this done every 1-3 years. Bone density scan. This is done to screen for osteoporosis. You may have this done starting at age 16. Mammogram. This may be done every 1-2 years. Talk to your  health care provider about how often you should have regular mammograms. Talk with your health care provider about your test results, treatment options, and if necessary, the need for more tests. Vaccines  Your health care provider may recommend certain vaccines, such as: Influenza vaccine. This is recommended every year. Tetanus, diphtheria, and acellular pertussis (Tdap, Td) vaccine. You may need a Td  booster every 10 years. Zoster vaccine. You may need this after age 32. Pneumococcal 13-valent conjugate (PCV13) vaccine. One dose is recommended after age 38. Pneumococcal polysaccharide (PPSV23) vaccine. One dose is recommended after age 79. Talk to your health care provider about which screenings and vaccines you need and how often you need them. This information is not intended to replace advice given to you by your health care provider. Make sure you discuss any questions you have with your health care provider. Document Released: 10/14/2015 Document Revised: 06/06/2016 Document Reviewed: 07/19/2015 Elsevier Interactive Patient Education  2017 [place] Prevention in the Home Falls can cause injuries. They can happen to people of all ages. There are many things you can do to make your home safe and to help prevent falls. What can I do on the outside of my home? Regularly fix the edges of walkways and driveways and fix any cracks. Remove anything that might make you trip as you walk through a door, such as a raised step or threshold. Trim any bushes or trees on the path to your home. Use bright outdoor lighting. Clear any walking paths of anything that might make someone trip, such as rocks or tools. Regularly check to see if handrails are loose or broken. Make sure that both sides of any steps have handrails. Any raised decks and porches should have guardrails on the edges. Have any leaves, snow, or ice cleared regularly. Use sand or salt on walking paths during winter. Clean up any spills in your garage right away. This includes oil or grease spills. What can I do in the bathroom? Use night lights. Install grab bars by the toilet and in the tub and shower. Do not use towel bars as grab bars. Use non-skid mats or decals in the tub or shower. If you need to sit down in the shower, use a plastic, non-slip stool. Keep the floor dry. Clean up any water that spills on the floor  as soon as it happens. Remove soap buildup in the tub or shower regularly. Attach bath mats securely with double-sided non-slip rug tape. Do not have throw rugs and other things on the floor that can make you trip. What can I do in the bedroom? Use night lights. Make sure that you have a light by your bed that is easy to reach. Do not use any sheets or blankets that are too big for your bed. They should not hang down onto the floor. Have a firm chair that has side arms. You can use this for support while you get dressed. Do not have throw rugs and other things on the floor that can make you trip. What can I do in the kitchen? Clean up any spills right away. Avoid walking on wet floors. Keep items that you use a lot in easy-to-reach places. If you need to reach something above you, use a strong step stool that has a grab bar. Keep electrical cords out of the way. Do not use floor polish or wax that makes floors slippery. If you must use wax, use non-skid floor wax. Do not have  throw rugs and other things on the floor that can make you trip. What can I do with my stairs? Do not leave any items on the stairs. Make sure that there are handrails on both sides of the stairs and use them. Fix handrails that are broken or loose. Make sure that handrails are as long as the stairways. Check any carpeting to make sure that it is firmly attached to the stairs. Fix any carpet that is loose or worn. Avoid having throw rugs at the top or bottom of the stairs. If you do have throw rugs, attach them to the floor with carpet tape. Make sure that you have a light switch at the top of the stairs and the bottom of the stairs. If you do not have them, ask someone to add them for you. What else can I do to help prevent falls? Wear shoes that: Do not have high heels. Have rubber bottoms. Are comfortable and fit you well. Are closed at the toe. Do not wear sandals. If you use a stepladder: Make sure that it is  fully opened. Do not climb a closed stepladder. Make sure that both sides of the stepladder are locked into place. Ask someone to hold it for you, if possible. Clearly mark and make sure that you can see: Any grab bars or handrails. First and last steps. Where the edge of each step is. Use tools that help you move around (mobility aids) if they are needed. These include: Canes. Walkers. Scooters. Crutches. Turn on the lights when you go into a dark area. Replace any light bulbs as soon as they burn out. Set up your furniture so you have a clear path. Avoid moving your furniture around. If any of your floors are uneven, fix them. If there are any pets around you, be aware of where they are. Review your medicines with your doctor. Some medicines can make you feel dizzy. This can increase your chance of falling. Ask your doctor what other things that you can do to help prevent falls. This information is not intended to replace advice given to you by your health care provider. Make sure you discuss any questions you have with your health care provider. Document Released: 07/14/2009 Document Revised: 02/23/2016 Document Reviewed: 10/22/2014 Elsevier Interactive Patient Education  2017 [employer].

## 2021-03-12 MED ORDER — FLUOXETINE HCL 40 MG PO CAPS: 40.0000 mg | ORAL_CAPSULE | Freq: Every day | ORAL | 0 refills | Status: DC

## 2021-03-12 MED ORDER — ROSUVASTATIN CALCIUM 5 MG PO TABS
5.0000 mg | ORAL_TABLET | Freq: Every day | ORAL | 0 refills | Status: DC
Start: 2021-03-12 — End: 2021-07-20

## 2021-03-21 ENCOUNTER — Ambulatory Visit: Payer: PPO | Admitting: Radiation Oncology

## 2021-03-27 DIAGNOSIS — N2581 Secondary hyperparathyroidism of renal origin: Secondary | ICD-10-CM | POA: Diagnosis not present

## 2021-03-27 DIAGNOSIS — N1831 Chronic kidney disease, stage 3a: Secondary | ICD-10-CM | POA: Diagnosis not present

## 2021-03-27 DIAGNOSIS — I10 Essential (primary) hypertension: Secondary | ICD-10-CM | POA: Diagnosis not present

## 2021-03-27 DIAGNOSIS — R808 Other proteinuria: Secondary | ICD-10-CM | POA: Diagnosis not present

## 2021-03-30 DIAGNOSIS — N189 Chronic kidney disease, unspecified: Secondary | ICD-10-CM | POA: Insufficient documentation

## 2021-03-30 DIAGNOSIS — I10 Essential (primary) hypertension: Secondary | ICD-10-CM | POA: Diagnosis not present

## 2021-03-30 DIAGNOSIS — N1831 Chronic kidney disease, stage 3a: Secondary | ICD-10-CM | POA: Diagnosis not present

## 2021-03-30 DIAGNOSIS — N2581 Secondary hyperparathyroidism of renal origin: Secondary | ICD-10-CM | POA: Diagnosis not present

## 2021-03-30 DIAGNOSIS — R809 Proteinuria, unspecified: Secondary | ICD-10-CM | POA: Insufficient documentation

## 2021-03-30 DIAGNOSIS — D631 Anemia in chronic kidney disease: Secondary | ICD-10-CM | POA: Insufficient documentation

## 2021-04-05 ENCOUNTER — Encounter: Payer: Self-pay | Admitting: Radiation Oncology

## 2021-04-05 ENCOUNTER — Ambulatory Visit
Admission: RE | Admit: 2021-04-05 | Discharge: 2021-04-05 | Disposition: A | Discharge status: Home / Self Care | Payer: PPO | Source: Ambulatory Visit | Attending: Radiation Oncology | Admitting: Radiation Oncology

## 2021-04-05 VITALS — BP 126/82 | HR 88 | Temp 96.7°F | Resp 20 | Wt 118.0 lb

## 2021-04-05 DIAGNOSIS — Z79811 Long term (current) use of aromatase inhibitors: Secondary | ICD-10-CM | POA: Diagnosis not present

## 2021-04-05 DIAGNOSIS — Z170 Estrogen receptor positive status [ER+]: Secondary | ICD-10-CM | POA: Diagnosis not present

## 2021-04-05 DIAGNOSIS — Z923 Personal history of irradiation: Secondary | ICD-10-CM | POA: Insufficient documentation

## 2021-04-05 DIAGNOSIS — C50311 Malignant neoplasm of lower-inner quadrant of right female breast: Secondary | ICD-10-CM | POA: Insufficient documentation

## 2021-04-05 NOTE — Progress Notes (Signed)
Radiation Oncology Follow up Note  Name: Tammy Choi   Date:   04/05/2021 MRN:  [id number] DOB: 1946/10/01    This 75 y.o. female presents to the clinic today for 2-year follow-up status post whole breast radiation to her right breast for stage I invasive mammary carcinoma ER/PR positive.  REFERRING PROVIDER: Leone Haven, MD  HPI: Patient is a 75 year old female now out 2 years having completed whole breast radiation to her right breast for stage I invasive mammary carcinoma ER/PR positive.  Seen today in routine follow-up she is doing fairly well she has lost some weight she has been on a diet for her renal disease which she states over the past year has caused her to lose some significant weight.  She specifically denies breast tenderness cough or bone pain..  She had mammograms last September which I reviewed were BI-RADS 2 benign.  She is currently on Arimidex tolerant at well without side effect.  COMPLICATIONS OF TREATMENT: none  FOLLOW UP COMPLIANCE: keeps appointments   PHYSICAL EXAM:  BP 126/82 (BP Location: Right Arm, Patient Position: Sitting)   Pulse 88   Temp (!) 96.7 F (35.9 C) (Oral)   Resp 20   Wt 118 lb (53.5 kg)   BMI 20.90 kg/m  Lungs are clear to A&P cardiac examination essentially unremarkable with regular rate and rhythm. No dominant mass or nodularity is noted in either breast in 2 positions examined. Incision is well-healed. No axillary or supraclavicular adenopathy is appreciated. Cosmetic result is excellent.  Well-developed well-nourished patient in NAD. HEENT reveals PERLA, EOMI, discs not visualized.  Oral cavity is clear. No oral mucosal lesions are identified. Neck is clear without evidence of cervical or supraclavicular adenopathy. Lungs are clear to A&P. Cardiac examination is essentially unremarkable with regular rate and rhythm without murmur rub or thrill. Abdomen is benign with no organomegaly or masses noted. Motor sensory and DTR levels  are equal and symmetric in the upper and lower extremities. Cranial nerves II through XII are grossly intact. Proprioception is intact. No peripheral adenopathy or edema is identified. No motor or sensory levels are noted. Crude visual fields are within normal range.  RADIOLOGY RESULTS: Mammograms reviewed compatible with above-stated findings  PLAN: Present time she continues to do well 2 years out with no evidence of disease.  Having encouraged her to increase her nutritional intake to prevent any further weight loss.  Of otherwise asked to see her back in 1 year for follow-up.  She will follow-up mammograms in September.  She continues on Arimidex without side effect.  Patient knows to call with any concerns.  I would like to take this opportunity to thank you for allowing me to participate in the care of your patient.Noreene Filbert, MD

## 2021-04-12 ENCOUNTER — Other Ambulatory Visit: Payer: Self-pay

## 2021-04-12 DIAGNOSIS — Z170 Estrogen receptor positive status [ER+]: Secondary | ICD-10-CM

## 2021-04-12 DIAGNOSIS — C50311 Malignant neoplasm of lower-inner quadrant of right female breast: Secondary | ICD-10-CM

## 2021-04-13 ENCOUNTER — Inpatient Hospital Stay: Payer: PPO | Admitting: Oncology

## 2021-04-13 ENCOUNTER — Inpatient Hospital Stay: Payer: PPO

## 2021-04-18 ENCOUNTER — Ambulatory Visit (INDEPENDENT_AMBULATORY_CARE_PROVIDER_SITE_OTHER): Payer: PPO | Admitting: Family Medicine

## 2021-04-18 ENCOUNTER — Other Ambulatory Visit: Payer: Self-pay

## 2021-04-18 VITALS — BP 130/70 | HR 77 | Temp 98.4°F | Ht 63.0 in | Wt 118.4 lb

## 2021-04-18 DIAGNOSIS — F331 Major depressive disorder, recurrent, moderate: Secondary | ICD-10-CM | POA: Diagnosis not present

## 2021-04-18 DIAGNOSIS — I10 Essential (primary) hypertension: Secondary | ICD-10-CM | POA: Diagnosis not present

## 2021-04-18 DIAGNOSIS — F411 Generalized anxiety disorder: Secondary | ICD-10-CM

## 2021-04-18 DIAGNOSIS — E785 Hyperlipidemia, unspecified: Secondary | ICD-10-CM | POA: Diagnosis not present

## 2021-04-18 DIAGNOSIS — R634 Abnormal weight loss: Secondary | ICD-10-CM | POA: Diagnosis not present

## 2021-04-18 DIAGNOSIS — R251 Tremor, unspecified: Secondary | ICD-10-CM | POA: Diagnosis not present

## 2021-04-18 DIAGNOSIS — J449 Chronic obstructive pulmonary disease, unspecified: Secondary | ICD-10-CM | POA: Diagnosis not present

## 2021-04-18 DIAGNOSIS — R8290 Unspecified abnormal findings in urine: Secondary | ICD-10-CM

## 2021-04-18 HISTORY — DX: Abnormal weight loss: R63.4

## 2021-04-18 LAB — POCT URINALYSIS DIPSTICK
Glucose, UA: POSITIVE — AB
Ketones, UA: NEGATIVE
Nitrite, UA: NEGATIVE
Protein, UA: POSITIVE — AB
Spec Grav, UA: >=1.03 — AB (ref 1.010–1.025)
Urobilinogen, UA: 0.2 E.U./dL
pH, UA: 5 (ref 5.0–8.0)

## 2021-04-18 LAB — LIPID PANEL
Cholesterol: 137 mg/dL (ref 0–200)
HDL: 61.3 mg/dL (ref >39.00)
LDL Cholesterol: 57 mg/dL (ref 0–99)
NonHDL: 75.9
Total CHOL/HDL Ratio: 2
Triglycerides: 96 mg/dL (ref 0.0–149.0)
VLDL: 19.2 mg/dL (ref 0.0–40.0)

## 2021-04-18 LAB — CBC WITH DIFFERENTIAL/PLATELET
Basophils Absolute: 0 10*3/uL (ref 0.0–0.1)
Basophils Relative: 0.7 % (ref 0.0–3.0)
Eosinophils Absolute: 0 10*3/uL (ref 0.0–0.7)
Eosinophils Relative: 0.4 % (ref 0.0–5.0)
HCT: 37.4 % (ref 36.0–46.0)
Hemoglobin: 12 g/dL (ref 12.0–15.0)
Lymphocytes Relative: 25.8 % (ref 12.0–46.0)
Lymphs Abs: 1.6 10*3/uL (ref 0.7–4.0)
MCHC: 32.1 g/dL (ref 30.0–36.0)
MCV: 82.8 fl (ref 78.0–100.0)
Monocytes Absolute: 0.4 10*3/uL (ref 0.1–1.0)
Monocytes Relative: 5.9 % (ref 3.0–12.0)
Neutro Abs: 4.2 10*3/uL (ref 1.4–7.7)
Neutrophils Relative %: 67.2 % (ref 43.0–77.0)
Platelets: 345 10*3/uL (ref 150.0–400.0)
RBC: 4.51 Mil/uL (ref 3.87–5.11)
RDW: 15.8 % — ABNORMAL HIGH (ref 11.5–15.5)
WBC: 6.2 10*3/uL (ref 4.0–10.5)

## 2021-04-18 LAB — COMPREHENSIVE METABOLIC PANEL
ALT: 10 U/L (ref 0–35)
AST: 12 U/L (ref 0–37)
Albumin: 4.1 g/dL (ref 3.5–5.2)
Alkaline Phosphatase: 72 U/L (ref 39–117)
BUN: 34 mg/dL — ABNORMAL HIGH (ref 6–23)
CO2: 27 mEq/L (ref 19–32)
Calcium: 9.4 mg/dL (ref 8.4–10.5)
Chloride: 99 mEq/L (ref 96–112)
Creatinine, Ser: 1.34 mg/dL — ABNORMAL HIGH (ref 0.40–1.20)
GFR: 38.72 mL/min — ABNORMAL LOW (ref >60.00)
Glucose, Bld: 101 mg/dL — ABNORMAL HIGH (ref 70–99)
Potassium: 4.5 mEq/L (ref 3.5–5.1)
Sodium: 136 mEq/L (ref 135–145)
Total Bilirubin: 0.4 mg/dL (ref 0.2–1.2)
Total Protein: 7 g/dL (ref 6.0–8.3)

## 2021-04-18 LAB — URINALYSIS, MICROSCOPIC ONLY

## 2021-04-18 LAB — SEDIMENTATION RATE: Sed Rate: 26 mm/hr (ref 0–30)

## 2021-04-18 LAB — TSH: TSH: 1.67 u[IU]/mL (ref 0.35–5.50)

## 2021-04-18 LAB — HEMOGLOBIN A1C: Hgb A1c MFr Bld: 6.3 % (ref 4.6–6.5)

## 2021-04-18 MED ORDER — CITALOPRAM HYDROBROMIDE 10 MG PO TABS: 10.0000 mg | ORAL_TABLET | Freq: Every day | ORAL | 3 refills | Status: DC

## 2021-04-18 MED ORDER — FLUOXETINE HCL 20 MG PO TABS: 20.0000 mg | ORAL_TABLET | Freq: Every day | ORAL | 0 refills | Status: DC

## 2021-04-18 NOTE — Assessment & Plan Note (Signed)
Undetermined cause.  I discussed that differential could be quite wide including her anxiety, thyroid dysfunction, or Parkinson's.  We will get lab work today.  Discussed the potential for seeing neurology in the future.

## 2021-04-18 NOTE — Addendum Note (Signed)
Addended by: Caryl Bis, Jalicia Roszak G on: 04/18/2021 01:17 PM   Modules accepted: Orders

## 2021-04-18 NOTE — Assessment & Plan Note (Signed)
See depression for plan.  I feel as though her anxiety and depression are driving her weight loss at this time.

## 2021-04-18 NOTE — Assessment & Plan Note (Signed)
Patient with fairly significant weight loss that started out as intentional though continue to lose weight unintentionally.  I suspect her anxiety and depression are playing a role in this and we will treat that as outlined under those problems.  We will obtain lab work to evaluate for other causes of weight loss.  Depending on how this progresses she may need imaging in the future.  I encouraged her to eat a high calorie diet.

## 2021-04-18 NOTE — Assessment & Plan Note (Signed)
Adequately controlled.  She will continue lisinopril 40 mg once daily and HCTZ 12.5 mg once daily.

## 2021-04-18 NOTE — Progress Notes (Addendum)
[name], MD Phone: [phone]  Tammy Choi is a 75 y.o. female who presents today for f/u.  HYPERTENSION Disease Monitoring Home BP Monitoring reports this has been stable chest pain-no    dyspnea-no Medications Compliance-taking lisinopril, HCTZ.  Edema-no  COPD: Medication compliance-taking Symbicort  rescue inhaler use-rare Dyspnea-no  wheezing-no  cough- no   HYPERLIPIDEMIA Symptoms Chest pain on exertion:  no   Medications: Compliance-taking Crestor right upper quadrant pain-no muscle aches-no  Anxiety/depression/weight loss: Patient notes initially she started trying to lose weight to help take pressure off of her joints.  She was eating lots of chicken and fruits and vegetables.  She decreased her portion sizes.  She eats 2-3 meals a day.  She does not really want to eat more than she has been eating.  She notes the weight came off with diet changes to start with though when she stopped making dietary changes the weight continued to come off.  She notes quite a bit of anxiety since she has found out about chronic kidney disease.  She does take Prozac though she has been on this for quite some time.  She does note some depression though wonders if the anxiety is contributing to that.  She reports suicidal thoughts in the past though no plan or intent.  No recent suicidal ideation.  No early satiety, vomiting, diarrhea, nausea, abdominal pain, blood in her stool, vaginal bleeding, hematuria, night sweats, or itching.  She reports her oncologist is aware of her weight loss.  Tremor: Patient notes onset of tremor around the time she found out she had chronic kidney disease.  She notes the tremor occurs at rest and sometimes does subside.  Sometimes it occurs with movements as well.   Social History   Tobacco Use  Smoking Status Former   Packs/day: 1.00   Years: 35.00   Pack years: 35.00   Types: Cigarettes   Quit date: 12/20/2015   Years since quitting: 5.3   Smokeless Tobacco Never    Current Outpatient Medications on File Prior to Visit  Medication Sig Dispense Refill   acetaminophen (TYLENOL) 500 MG tablet Take 500-1,000 mg by mouth as needed for moderate pain or headache.     anastrozole (ARIMIDEX) 1 MG tablet Take 1 tablet by mouth once daily 90 tablet 0   budesonide-formoterol (SYMBICORT) 160-4.5 MCG/ACT inhaler Inhale 2 puffs by mouth twice daily 33 g 0   Cholecalciferol 25 MCG (1000 UT) tablet Take by mouth.     hydrochlorothiazide (HYDRODIURIL) 12.5 MG tablet Take 1 tablet by mouth once daily 90 tablet 0   lisinopril (ZESTRIL) 40 MG tablet Take 1 tablet by mouth once daily 90 tablet 0   PROAIR HFA 108 (90 Base) MCG/ACT inhaler INHALE 2 PUFFS BY MOUTH EVERY 6 HOURS AS NEEDED FOR WHEEZING OR SHORTNESS OF BREATH (Patient taking differently: Inhale 1-2 puffs into the lungs as needed for wheezing or shortness of breath.) 9 g 0   rosuvastatin (CRESTOR) 5 MG tablet Take 1 tablet (5 mg total) by mouth daily. 90 tablet 0   No current facility-administered medications on file prior to visit.     ROS see history of present illness  Objective  Physical Exam Vitals:   04/18/21 1054  BP: 130/70  Pulse: 77  Temp: 98.4 F (36.9 C)  SpO2: 97%    BP Readings from Last 3 Encounters:  04/18/21 130/70  04/05/21 126/82  10/26/20 140/79   Wt Readings from Last 3 Encounters:  04/18/21 118 lb  6.4 oz (53.7 kg)  04/05/21 118 lb (53.5 kg)  03/10/21 139 lb (63 kg)    Physical Exam Constitutional:      General: She is not in acute distress.    Appearance: She is not diaphoretic.     Comments: Thin appearing  HENT:     Head: Normocephalic and atraumatic.  Cardiovascular:     Rate and Rhythm: Normal rate and regular rhythm.     Heart sounds: Normal heart sounds.  Pulmonary:     Effort: Pulmonary effort is normal.     Breath sounds: Normal breath sounds.  Chest:  Breasts:    Right: No axillary adenopathy or supraclavicular adenopathy.      Left: No axillary adenopathy or supraclavicular adenopathy.  Abdominal:     General: Bowel sounds are normal. There is no distension.     Palpations: Abdomen is soft.     Tenderness: There is no abdominal tenderness. There is no guarding or rebound.  Musculoskeletal:     Right lower leg: No edema.     Left lower leg: No edema.  Lymphadenopathy:     Head:     Right side of head: No submental or submandibular adenopathy.     Left side of head: No submental or submandibular adenopathy.     Cervical: No cervical adenopathy.     Upper Body:     Right upper body: No supraclavicular or axillary adenopathy.     Left upper body: No supraclavicular or axillary adenopathy.     Lower Body: No right inguinal adenopathy. No left inguinal adenopathy.     Comments: [name], CMA served as chaperone  Skin:    General: Skin is warm and dry.  Neurological:     Mental Status: She is alert.     Comments: PERRL, EOMI, V1 through V3 intact bilaterally to light touch, hearing intact to finger rub, shoulder shrug intact, opens and closes eyes adequately, 5/5 strength in bilateral biceps, triceps, grip, quads, hamstrings, plantar and dorsiflexion, sensation to light touch intact in bilateral UE and LE, normal gait, resting tremor noted, possible cogwheel rigidity bilateral upper extremities  Psychiatric:     Comments: Affect flat, intermittently tearful     Assessment/Plan: Please see individual problem list.  Problem List Items Addressed This Visit     COPD (chronic obstructive pulmonary disease) ([place]) (Chronic)    Well-controlled.  Symbicort will be continued.  Albuterol refilled for the patient.       Anxiety state    See depression for plan.  I feel as though her anxiety and depression are driving her weight loss at this time.       Relevant Medications   citalopram (CELEXA) 10 MG tablet   FLUoxetine (PROZAC) 20 MG tablet   Depression    Chronic issue.  Uncontrolled.  We are going  to taper down Prozac to 20 mg once daily for 2 weeks and then transition her over to Celexa.  I discussed it may take some time for the Celexa to start to work.  Advised to seek medical attention for suicidal ideation.  Discussed letting us know if her depression or anxiety worsens.       Relevant Medications   citalopram (CELEXA) 10 MG tablet   FLUoxetine (PROZAC) 20 MG tablet   Hyperlipidemia   Relevant Orders   Comp Met (CMET)   Lipid panel   Hypertension - Primary    Adequately controlled.  She will continue lisinopril 40 mg once daily  and HCTZ 12.5 mg once daily.       Relevant Orders   Comp Met (CMET)   Tremor    Undetermined cause.  I discussed that differential could be quite wide including her anxiety, thyroid dysfunction, or Parkinson's.  We will get lab work today.  Discussed the potential for seeing neurology in the future.       Weight loss    Patient with fairly significant weight loss that started out as intentional though continue to lose weight unintentionally.  I suspect her anxiety and depression are playing a role in this and we will treat that as outlined under those problems.  We will obtain lab work to evaluate for other causes of weight loss.  Depending on how this progresses she may need imaging in the future.  I encouraged her to eat a high calorie diet.       Relevant Orders   CBC w/Diff   TSH   Sedimentation rate   HgB A1c   HIV antibody (with reflex)   Hepatitis C Antibody   POCT Urinalysis Dipstick (Completed)   Other Visit Diagnoses     Abnormal urine       Relevant Orders   Urine Microscopic Only      Return in about 4 weeks (around 05/16/2021) for weight loss and anxiety.  This visit occurred during the SARS-CoV-2 public health emergency.  Safety protocols were in place, including screening questions prior to the visit, additional usage of staff PPE, and extensive cleaning of exam room while observing appropriate contact time as indicated  for disinfecting solutions.    [name], MD [place]

## 2021-04-18 NOTE — Assessment & Plan Note (Signed)
Chronic issue.  Uncontrolled.  We are going to taper down Prozac to 20 mg once daily for 2 weeks and then transition her over to Celexa.  I discussed it may take some time for the Celexa to start to work.  Advised to seek medical attention for suicidal ideation.  Discussed letting us know if her depression or anxiety worsens.

## 2021-04-18 NOTE — Patient Instructions (Signed)
Nice to see you. We will get lab work today. Please decrease your dose of the Prozac to 20 mg once daily for 2 weeks.  You can discontinue it after 2 weeks and then start on the Celexa 10 mg once daily. If your anxiety or depression worsens please let us know.  If you develop suicidal thoughts or have intent or plan to harm yourself please call 911 immediately.

## 2021-04-18 NOTE — Assessment & Plan Note (Signed)
Well-controlled.  Symbicort will be continued.  Albuterol refilled for the patient.

## 2021-04-19 ENCOUNTER — Other Ambulatory Visit: Payer: Self-pay | Admitting: Family Medicine

## 2021-04-19 ENCOUNTER — Other Ambulatory Visit: Payer: Self-pay | Admitting: Oncology

## 2021-04-19 LAB — HIV ANTIBODY (ROUTINE TESTING W REFLEX): HIV 1&2 Ab, 4th Generation: NONREACTIVE

## 2021-04-19 LAB — HEPATITIS C ANTIBODY
Hepatitis C Ab: NONREACTIVE
SIGNAL TO CUT-OFF: 0.02 (ref <1.00)

## 2021-04-20 ENCOUNTER — Encounter: Payer: Self-pay | Admitting: Oncology

## 2021-05-12 ENCOUNTER — Encounter: Payer: Self-pay | Admitting: Oncology

## 2021-05-12 ENCOUNTER — Inpatient Hospital Stay: Payer: PPO | Admitting: Oncology

## 2021-05-12 ENCOUNTER — Inpatient Hospital Stay: Payer: PPO

## 2021-05-12 ENCOUNTER — Inpatient Hospital Stay: Payer: PPO | Attending: Oncology

## 2021-05-12 ENCOUNTER — Other Ambulatory Visit: Payer: Self-pay

## 2021-05-12 VITALS — BP 142/70 | HR 66 | Temp 98.6°F | Resp 18 | Wt 118.6 lb

## 2021-05-12 DIAGNOSIS — M818 Other osteoporosis without current pathological fracture: Secondary | ICD-10-CM | POA: Diagnosis not present

## 2021-05-12 DIAGNOSIS — Z79811 Long term (current) use of aromatase inhibitors: Secondary | ICD-10-CM

## 2021-05-12 DIAGNOSIS — M810 Age-related osteoporosis without current pathological fracture: Secondary | ICD-10-CM

## 2021-05-12 DIAGNOSIS — C50311 Malignant neoplasm of lower-inner quadrant of right female breast: Secondary | ICD-10-CM | POA: Diagnosis not present

## 2021-05-12 DIAGNOSIS — Z87891 Personal history of nicotine dependence: Secondary | ICD-10-CM | POA: Insufficient documentation

## 2021-05-12 DIAGNOSIS — E785 Hyperlipidemia, unspecified: Secondary | ICD-10-CM | POA: Insufficient documentation

## 2021-05-12 DIAGNOSIS — Z7983 Long term (current) use of bisphosphonates: Secondary | ICD-10-CM | POA: Diagnosis not present

## 2021-05-12 DIAGNOSIS — Z170 Estrogen receptor positive status [ER+]: Secondary | ICD-10-CM | POA: Insufficient documentation

## 2021-05-12 DIAGNOSIS — N1831 Chronic kidney disease, stage 3a: Secondary | ICD-10-CM | POA: Insufficient documentation

## 2021-05-12 DIAGNOSIS — Z923 Personal history of irradiation: Secondary | ICD-10-CM | POA: Diagnosis not present

## 2021-05-12 DIAGNOSIS — Z79899 Other long term (current) drug therapy: Secondary | ICD-10-CM | POA: Diagnosis not present

## 2021-05-12 DIAGNOSIS — I129 Hypertensive chronic kidney disease with stage 1 through stage 4 chronic kidney disease, or unspecified chronic kidney disease: Secondary | ICD-10-CM | POA: Diagnosis not present

## 2021-05-12 DIAGNOSIS — J449 Chronic obstructive pulmonary disease, unspecified: Secondary | ICD-10-CM | POA: Insufficient documentation

## 2021-05-12 DIAGNOSIS — F32A Depression, unspecified: Secondary | ICD-10-CM | POA: Diagnosis not present

## 2021-05-12 DIAGNOSIS — Z1231 Encounter for screening mammogram for malignant neoplasm of breast: Secondary | ICD-10-CM

## 2021-05-12 LAB — COMPREHENSIVE METABOLIC PANEL
ALT: 13 U/L (ref 0–44)
AST: 17 U/L (ref 15–41)
Albumin: 3.7 g/dL (ref 3.5–5.0)
Alkaline Phosphatase: 52 U/L (ref 38–126)
Anion gap: 8 (ref 5–15)
BUN: 40 mg/dL — ABNORMAL HIGH (ref 8–23)
CO2: 27 mmol/L (ref 22–32)
Calcium: 9.5 mg/dL (ref 8.9–10.3)
Chloride: 102 mmol/L (ref 98–111)
Creatinine, Ser: 1.28 mg/dL — ABNORMAL HIGH (ref 0.44–1.00)
GFR, Estimated: 44 mL/min — ABNORMAL LOW (ref >60)
Glucose, Bld: 121 mg/dL — ABNORMAL HIGH (ref 70–99)
Potassium: 4.8 mmol/L (ref 3.5–5.1)
Sodium: 137 mmol/L (ref 135–145)
Total Bilirubin: 0.7 mg/dL (ref 0.3–1.2)
Total Protein: 7.3 g/dL (ref 6.5–8.1)

## 2021-05-12 LAB — CBC WITH DIFFERENTIAL/PLATELET
Abs Immature Granulocytes: 0.03 10*3/uL (ref 0.00–0.07)
Basophils Absolute: 0 10*3/uL (ref 0.0–0.1)
Basophils Relative: 1 %
Eosinophils Absolute: 0.1 10*3/uL (ref 0.0–0.5)
Eosinophils Relative: 1 %
HCT: 35.9 % — ABNORMAL LOW (ref 36.0–46.0)
Hemoglobin: 11.1 g/dL — ABNORMAL LOW (ref 12.0–15.0)
Immature Granulocytes: 1 %
Lymphocytes Relative: 30 %
Lymphs Abs: 1.9 10*3/uL (ref 0.7–4.0)
MCH: 26.7 pg (ref 26.0–34.0)
MCHC: 30.9 g/dL (ref 30.0–36.0)
MCV: 86.5 fL (ref 80.0–100.0)
Monocytes Absolute: 0.3 10*3/uL (ref 0.1–1.0)
Monocytes Relative: 4 %
Neutro Abs: 4.2 10*3/uL (ref 1.7–7.7)
Neutrophils Relative %: 63 %
Platelets: 288 10*3/uL (ref 150–400)
RBC: 4.15 MIL/uL (ref 3.87–5.11)
RDW: 15.6 % — ABNORMAL HIGH (ref 11.5–15.5)
WBC: 6.5 10*3/uL (ref 4.0–10.5)
nRBC: 0 % (ref 0.0–0.2)

## 2021-05-12 MED ORDER — DENOSUMAB 60 MG/ML ~~LOC~~ SOSY
60.0000 mg | PREFILLED_SYRINGE | Freq: Once | SUBCUTANEOUS | Status: AC
  Administered 2021-05-12: 60 mg via SUBCUTANEOUS
  Filled 2021-05-12: qty 1

## 2021-05-12 NOTE — Progress Notes (Signed)
Pt here for follow up. No new concerns voiced.   

## 2021-05-13 ENCOUNTER — Encounter: Payer: Self-pay | Admitting: Oncology

## 2021-05-13 NOTE — Progress Notes (Signed)
Hematology/Oncology follow up note [facility] [phone] [phone]   Patient Care Team: Leone Haven, MD as PCP - General (Family Medicine) Bary Castilla, [name], MD (General Surgery)  REFERRING PROVIDER: Dr. Bary Castilla REASON FOR VISIT:  Evaluation of breast cancer  HISTORY OF PRESENTING ILLNESS:  Tammy Choi is a  75 y.o.  female with PMH listed below who was referred to me for evaluation of newly diagnosed breast cancer. Patient had mammogram 05/01/2018 which showed indeterminate microcalcification in the medial right breast measuring 0.9 x 0.7 x 0.7 cm Biopsy pathology showed: Invasive mammary carcinoma, DCIS, calcification associated with DCIS, grade 2, ER positive PR negative, HER-2 negative (IHC 1+)  Nipple discharge: Denies Family history: Sister passed away from breast cancer History of radiation to chest: denies.  Previous breast surgery: Left breast biopsy with benign etiology. . 06/16/2018, right breast lumpectomy and sentinel lymph node biopsy showed residual invasive mammary carcinoma, residual ductal carcinoma in situ, biopsy site changes and metallic clip.  Incidental 5 mm fibroadenoma.  All 3 sentinel lymph node negative. ER positive HER2 negative.  Patient underwent adjuvant radiation finished in January 2020. Started aromatase inhibitor with Arimidex 1 mg daily since 1/20/ 2020.  # she followed up with urology Dr. Diamantina Providence for bilateral hydronephrosis. Patient was recommended to have CT urogram done for evaluation of filling defects with monitoring kidney function versus more invasive investigation with cystoscopy, bilateral retrograde pyelograms and possible ureteroscopy possible biopsy.she choose to have a image surveillance with monitoring of kidney function.  # Arimidex  Since Jan 2020.  #Chronic bilateral hydronephrosis, she has had work up with Urology and no obstruction has been found.  INTERVAL  HISTORY Tammy Choi is a 75 y.o. female who has above history reviewed by me today presents for follow up visit for management of Stage IA right breast cancer, ER + HER 2 - She has no new complaints. Patient takes Arimidex and tolerates well. No new complaints  Review of Systems  Constitutional:  Positive for malaise/fatigue. Negative for chills, fever and weight loss.  HENT:  Negative for nosebleeds and sore throat.   Eyes:  Negative for double vision, photophobia and redness.  Respiratory:  Negative for cough, shortness of breath and wheezing.   Cardiovascular:  Negative for chest pain, palpitations, orthopnea and leg swelling.  Gastrointestinal:  Negative for abdominal pain, blood in stool, nausea and vomiting.  Genitourinary:  Negative for dysuria.  Musculoskeletal:  Negative for back pain, myalgias and neck pain.       Right hip replacement.   Skin:  Negative for itching and rash.  Neurological:  Negative for dizziness, tingling and tremors.  Endo/Heme/Allergies:  Negative for environmental allergies. Does not bruise/bleed easily.  Psychiatric/Behavioral:  Negative for depression and hallucinations. The patient is not nervous/anxious.    MEDICAL HISTORY:  Past Medical History:  Diagnosis Date   Arthritis    Breast cancer ([place]) 05/07/2018   T1a, N0; ER/ PR positive, Her 2 neu not overexpressed.    COPD (chronic obstructive pulmonary disease) ([place])    Not on home o2   Depression    HTN (hypertension)    Hyperlipemia    Osteoporosis 07/28/2020   Personal history of radiation therapy     SURGICAL HISTORY: Past Surgical History:  Procedure Laterality Date   ABDOMINAL HYSTERECTOMY     BREAST BIOPSY Left 04/30/2013   neg core   BREAST BIOPSY Right 05/07/2018   right breast stereo x clip INVASIVE MAMMARY CARCINOMA  BREAST LUMPECTOMY Right 2019   BREAST LUMPECTOMY WITH SENTINEL LYMPH NODE BIOPSY Right 06/16/2018   Procedure: BREAST LUMPECTOMY WITH SENTINEL LYMPH NODE BX;   Surgeon: Robert Bellow, MD;  Location: [facility] ORS;  Service: General;  Laterality: Right;   COLONOSCOPY  2014   Dr Bary Castilla   Left Leg Surgery     TONSILLECTOMY     TOTAL HIP ARTHROPLASTY Right 03/29/2020   Procedure: [place];  Surgeon: Corky Mull, MD;  Location: [facility] ORS;  Service: Orthopedics;  Laterality: Right;    SOCIAL HISTORY: Social History   Socioeconomic History   Marital status: Divorced    Spouse name: Not on file   Number of children: Not on file   Years of education: Not on file   Highest education level: Not on file  Occupational History   Occupation: Retired Database administrator: retired  Tobacco Use   Smoking status: Former    Packs/day: 1.00    Years: 35.00    Pack years: 35.00    Types: Cigarettes    Quit date: 12/20/2015    Years since quitting: 5.4   Smokeless tobacco: Never  Vaping Use   Vaping Use: Never used  Substance and Sexual Activity   Alcohol use: No    Alcohol/week: 0.0 standard drinks   Drug use: No   Sexual activity: Not on file  Other Topics Concern   Not on file  Social History Narrative   Lives with daughter at home. Independent at baseline.   Social Determinants of Health   Financial Resource Strain: Low Risk    Difficulty of Paying Living Expenses: Not hard at all  Food Insecurity: No Food Insecurity   Worried About Charity fundraiser in the Last Year: Never true   [place] in the Last Year: Never true  Transportation Needs: No Transportation Needs   Lack of Transportation (Medical): No   Lack of Transportation (Non-Medical): No  Physical Activity: Not on file  Stress: No Stress Concern Present   Feeling of Stress : Not at all  Social Connections: Unknown   Frequency of Communication with Friends and Family: More than three times a week   Frequency of Social Gatherings with Friends and Family: Not on file   Attends Religious Services: Not on Electrical engineer or  Organizations: Not on file   Attends Archivist Meetings: Not on file   Marital Status: Not on file  Intimate Partner Violence: Not At Risk   Fear of Current or Ex-Partner: No   Emotionally Abused: No   Physically Abused: No   Sexually Abused: No    FAMILY HISTORY: Family History  Problem Relation Age of Onset   Kidney disease Mother        deceased 53   Heart disease Father        deceased 52   Breast cancer Other 72       maternal half-sister; deceased 86   Colon cancer Neg Hx     ALLERGIES:  is allergic to penicillins.  MEDICATIONS:  Current Outpatient Medications  Medication Sig Dispense Refill   anastrozole (ARIMIDEX) 1 MG tablet Take 1 tablet by mouth once daily 90 tablet 0   budesonide-formoterol (SYMBICORT) 160-4.5 MCG/ACT inhaler Inhale 2 puffs by mouth twice daily 33 g 0   calcitRIOL (ROCALTROL) 0.25 MCG capsule Take 0.25 mcg by mouth daily.     Cholecalciferol 25 MCG (1000 UT) tablet  Take by mouth.     citalopram (CELEXA) 10 MG tablet Take 1 tablet (10 mg total) by mouth daily. 90 tablet 3   lisinopril (ZESTRIL) 40 MG tablet Take 1 tablet by mouth once daily 90 tablet 0   PROAIR HFA 108 (90 Base) MCG/ACT inhaler INHALE 2 PUFFS BY MOUTH EVERY 6 HOURS AS NEEDED FOR WHEEZING OR SHORTNESS OF BREATH (Patient taking differently: Inhale 1-2 puffs into the lungs as needed for wheezing or shortness of breath.) 9 g 0   rosuvastatin (CRESTOR) 5 MG tablet Take 1 tablet (5 mg total) by mouth daily. 90 tablet 0   acetaminophen (TYLENOL) 500 MG tablet Take 500-1,000 mg by mouth as needed for moderate pain or headache. (Patient not taking: Reported on 05/12/2021)     FLUoxetine (PROZAC) 20 MG tablet Take 1 tablet (20 mg total) by mouth daily. (Patient not taking: Reported on 05/12/2021) 30 tablet 0   hydrochlorothiazide (HYDRODIURIL) 12.5 MG tablet Take 1 tablet by mouth once daily (Patient not taking: Reported on 05/12/2021) 90 tablet 0   No current facility-administered  medications for this visit.     PHYSICAL EXAMINATION: ECOG PERFORMANCE STATUS: 0 - Asymptomatic Vitals:   05/12/21 1211  BP: (!) 142/70  Pulse: 66  Resp: 18  Temp: 98.6 F (37 C)  SpO2: 100%   Filed Weights   05/12/21 1211  Weight: 118 lb 9.6 oz (53.8 kg)    Physical Exam Constitutional:      General: She is not in acute distress. HENT:     Head: Normocephalic and atraumatic.  Eyes:     General: No scleral icterus.    Pupils: Pupils are equal, round, and reactive to light.  Cardiovascular:     Rate and Rhythm: Normal rate and regular rhythm.     Heart sounds: Normal heart sounds.  Pulmonary:     Effort: Pulmonary effort is normal. No respiratory distress.     Breath sounds: No wheezing.  Abdominal:     General: Bowel sounds are normal. There is no distension.     Palpations: Abdomen is soft. There is no mass.     Tenderness: There is no abdominal tenderness.  Musculoskeletal:        General: No deformity. Normal range of motion.     Cervical back: Normal range of motion and neck supple.  Skin:    General: Skin is warm and dry.     Findings: No erythema or rash.  Neurological:     Mental Status: She is alert and oriented to person, place, and time.     Cranial Nerves: No cranial nerve deficit.     Coordination: Coordination normal.  Psychiatric:        Behavior: Behavior normal.        Thought Content: Thought content normal.      LABORATORY DATA:  I have reviewed the data as listed Lab Results  Component Value Date   WBC 6.5 05/12/2021   HGB 11.1 (L) 05/12/2021   HCT 35.9 (L) 05/12/2021   MCV 86.5 05/12/2021   PLT 288 05/12/2021   Recent Labs    05/30/20 1304 06/10/20 1055 07/28/20 0923 10/14/20 1105 04/18/21 1126 05/12/21 1146  NA 139   < > 136 119* 136 137  K 4.6   < > 5.3* 4.0 4.5 4.8  CL 105   < > 100 83* 99 102  CO2 25   < > _0 GLUCOSE 103*   < > 107*  112* 101* 121*  BUN 42*   < > 35* 21 34* 40*  CREATININE 1.46*   < >  1.22* 1.17* 1.34* 1.28*  CALCIUM 9.0   < > 9.2 9.3 9.4 9.5  GFRNONAA 35*  --  46* 49*  --  44*  GFRAA 41*  --   --   --   --   --   PROT 7.1  --  7.6 6.9 7.0 7.3  ALBUMIN 3.8  --  3.9 4.1 4.1 3.7  AST 15  --  _0 ALT 13  --  _1 ALKPHOS 77  --  89 83 72 52  BILITOT 0.3  --  0.5 0.5 0.4 0.7   < > = values in this interval not displayed.    Iron/TIBC/Ferritin/ %Sat No results found for: IRON, TIBC, FERRITIN, IRONPCTSAT   RADIOGRAPHIC STUDIES: I have personally reviewed the radiological images as listed and agreed with the findings in the report.   ASSESSMENT & PLAN:  1. Malignant neoplasm of lower-inner quadrant of right breast of female, estrogen receptor positive ([place])   2. Aromatase inhibitor use   3. Stage 3a chronic kidney disease ([place])   4. Other osteoporosis without current pathological fracture   Cancer Staging Malignant neoplasm of lower-inner quadrant of right breast of female, estrogen receptor positive ([place]) Staging form: Breast, AJCC 8th Edition - Pathologic stage from 09/29/2018: Stage IA (pT1a, pN0, cM0, G2, ER+, PR-, HER2-) - Signed by Earlie Server, MD on 09/29/2018  Stage IA breast cancer ER positive, PR negative, HER-2 negative. Clinically she is doing well.  Labs are reviewed and discussed with patient. Continue Arimidex.  Needs annual mammogram, due in Sept 2022, she get mammogram ordered through surgeon's office.   Osteoporosis, she has full mouth dentures.  Proceed with Prolia today.  Continue calcium and vitamin D supplementation.  Repeat DEXA in Sept 2023  # Weight loss, former smoker, declined CT chest wo due to financial reason. Could also be due to anxiety and depression. Monitor clinically.   # Mild anemia, Hb is 11.1 probabaly due to CKD.  SPEP was done previously, negative for M protein. Check iron tibc ferritin at next visit.   All questions were answered. The patient knows to call the clinic with any problems questions or  concerns. Return of visit:   Lab -BMP and Prolia in 6 months.  Lab cbc cmp- MD + Prolia in 12 months.    Earlie Server, MD, PhD Hematology Oncology [place] [facility] 05/13/2021

## 2021-05-15 NOTE — Addendum Note (Signed)
Addended by: Lesly Rubenstein on: 05/15/2021 11:31 AM   Modules accepted: Orders

## 2021-05-19 ENCOUNTER — Ambulatory Visit (INDEPENDENT_AMBULATORY_CARE_PROVIDER_SITE_OTHER): Payer: PPO | Admitting: Family Medicine

## 2021-05-19 ENCOUNTER — Other Ambulatory Visit: Payer: Self-pay

## 2021-05-19 DIAGNOSIS — I10 Essential (primary) hypertension: Secondary | ICD-10-CM

## 2021-05-19 DIAGNOSIS — F411 Generalized anxiety disorder: Secondary | ICD-10-CM | POA: Diagnosis not present

## 2021-05-19 DIAGNOSIS — R634 Abnormal weight loss: Secondary | ICD-10-CM | POA: Diagnosis not present

## 2021-05-19 MED ORDER — CITALOPRAM HYDROBROMIDE 20 MG PO TABS: 20.0000 mg | ORAL_TABLET | Freq: Every day | ORAL | 1 refills | Status: DC

## 2021-05-19 NOTE — Progress Notes (Signed)
[name], MD Phone: [phone]  Tammy Choi is a 75 y.o. female who presents today for follow-up.  Anxiety: Patient notes some improvement on the 10 mg dose of Celexa.  She is completely off of the Prozac.  She has been on the Celexa for a couple of weeks.  She notes no depression.  No SI.  Feels that her weight has gone up.  She is eating more fattening foods.  Hypertension: Notes this is typically similar to today.  She is taking lisinopril.  No chest pain, shortness of breath, or edema.  Social History   Tobacco Use  Smoking Status Former   Packs/day: 1.00   Years: 35.00   Pack years: 35.00   Types: Cigarettes   Quit date: 12/20/2015   Years since quitting: 5.4  Smokeless Tobacco Never    Current Outpatient Medications on File Prior to Visit  Medication Sig Dispense Refill   anastrozole (ARIMIDEX) 1 MG tablet Take 1 tablet by mouth once daily 90 tablet 0   budesonide-formoterol (SYMBICORT) 160-4.5 MCG/ACT inhaler Inhale 2 puffs by mouth twice daily 33 g 0   calcitRIOL (ROCALTROL) 0.25 MCG capsule Take 0.25 mcg by mouth daily.     Cholecalciferol 25 MCG (1000 UT) tablet Take by mouth.     hydrochlorothiazide (HYDRODIURIL) 12.5 MG tablet Take 1 tablet by mouth once daily 90 tablet 0   lisinopril (ZESTRIL) 40 MG tablet Take 1 tablet by mouth once daily 90 tablet 0   PROAIR HFA 108 (90 Base) MCG/ACT inhaler INHALE 2 PUFFS BY MOUTH EVERY 6 HOURS AS NEEDED FOR WHEEZING OR SHORTNESS OF BREATH (Patient taking differently: Inhale 1-2 puffs into the lungs as needed for wheezing or shortness of breath.) 9 g 0   rosuvastatin (CRESTOR) 5 MG tablet Take 1 tablet (5 mg total) by mouth daily. 90 tablet 0   No current facility-administered medications on file prior to visit.     ROS see history of present illness  Objective  Physical Exam Vitals:   05/19/21 1039  BP: 130/70  Pulse: 65  Temp: 98.5 F (36.9 C)  SpO2: 97%    BP Readings from Last 3 Encounters:   05/19/21 130/70  05/12/21 (!) 142/70  04/18/21 130/70   Wt Readings from Last 3 Encounters:  05/19/21 121 lb 6.4 oz (55.1 kg)  05/12/21 118 lb 9.6 oz (53.8 kg)  04/18/21 118 lb 6.4 oz (53.7 kg)    Physical Exam Constitutional:      General: She is not in acute distress.    Appearance: She is not diaphoretic.  Cardiovascular:     Rate and Rhythm: Normal rate and regular rhythm.     Heart sounds: Normal heart sounds.  Pulmonary:     Effort: Pulmonary effort is normal.     Breath sounds: Normal breath sounds.  Skin:    General: Skin is warm and dry.  Neurological:     Mental Status: She is alert.     Assessment/Plan: Please see individual problem list.  Problem List Items Addressed This Visit     Anxiety state    She has noted some improvement.  She notes no side effects of the Celexa.  We will proceed with increasing this to 20 mg once daily.  She can take 2 of her current tablets to equal 20 mg dose.  She will then pick up her new prescription from the pharmacy.  We will see her back in 3 months.  She will let us know if  anything is worsening prior to then.      Relevant Medications   citalopram (CELEXA) 20 MG tablet   Hypertension    Adequate control.  She will continue lisinopril 40 mg once daily.      Weight loss    This has improved with altering her diet.  I suspect it will continue to improve as her anxiety and depression improve.       Return in about 3 months (around 08/19/2021) for Anxiety/depression.  This visit occurred during the SARS-CoV-2 public health emergency.  Safety protocols were in place, including screening questions prior to the visit, additional usage of staff PPE, and extensive cleaning of exam room while observing appropriate contact time as indicated for disinfecting solutions.    [name], MD [place]

## 2021-05-19 NOTE — Assessment & Plan Note (Signed)
She has noted some improvement.  She notes no side effects of the Celexa.  We will proceed with increasing this to 20 mg once daily.  She can take 2 of her current tablets to equal 20 mg dose.  She will then pick up her new prescription from the pharmacy.  We will see her back in 3 months.  She will let us know if anything is worsening prior to then.

## 2021-05-19 NOTE — Assessment & Plan Note (Signed)
This has improved with altering her diet.  I suspect it will continue to improve as her anxiety and depression improve.

## 2021-05-19 NOTE — Assessment & Plan Note (Signed)
Adequate control.  She will continue lisinopril 40 mg once daily.

## 2021-05-19 NOTE — Patient Instructions (Signed)
Nice to see you. Please take 2 of the Celexa 10 mg tablets to equal a 20 mg dose.  You will then start on the 20 mg tablets of Celexa. If your depression or anxiety worsens or your weight starts to trend down again please let us know.

## 2021-05-23 IMAGING — US A
1 series · 3 of 3 positions shown · non-contrast
Comparison: Previous exam(s).

CLINICAL DATA: Palpable lump in the right axilla felt by the
patient's physician. The patient has not noticed a change.

EXAM:
ULTRASOUND OF THE RIGHT AXILLA

[Series 1: a · 0.07mm/px · 3 of 3 slices shown]
[im 1/3]
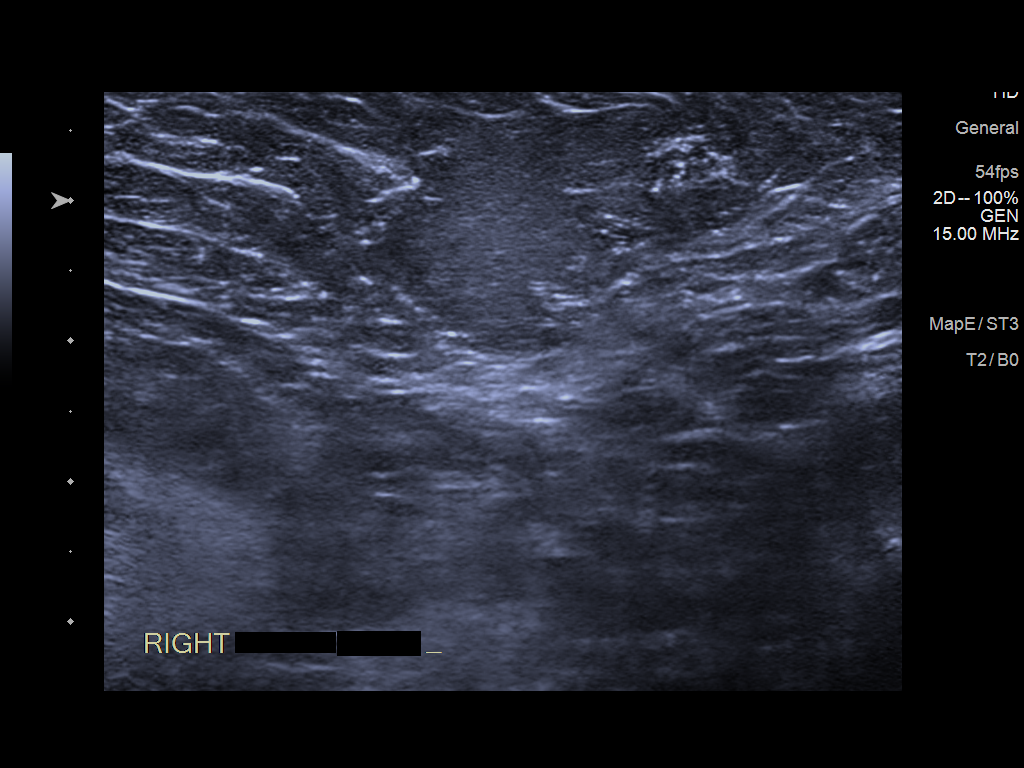
[im 2/3]
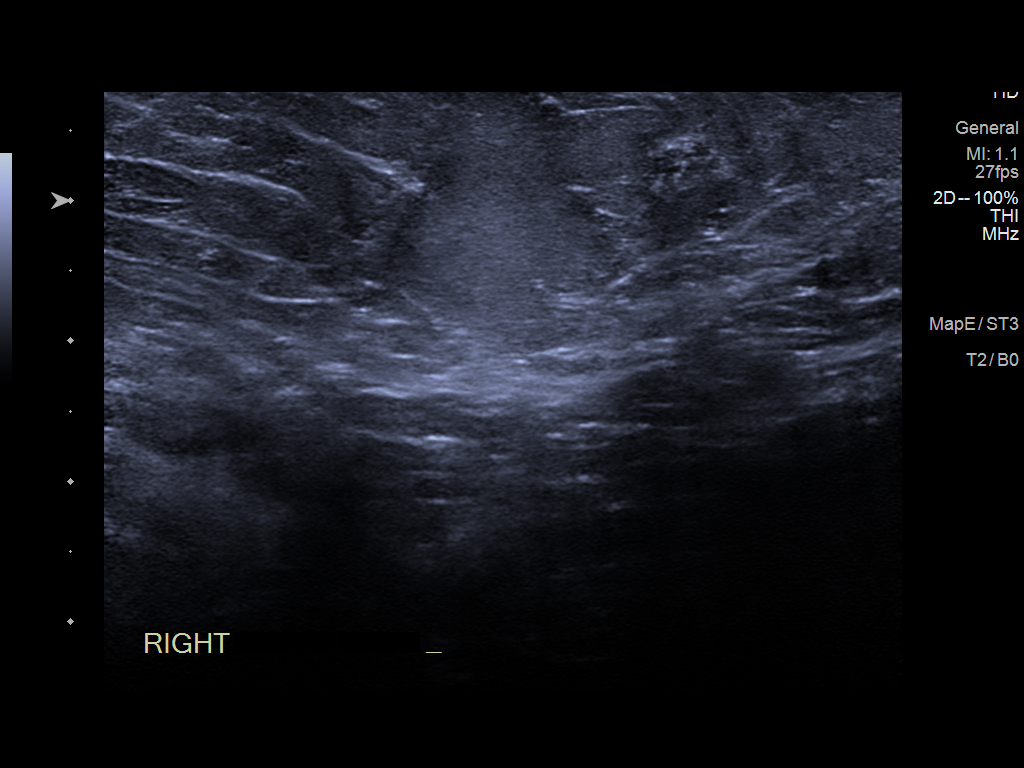
[im 3/3]
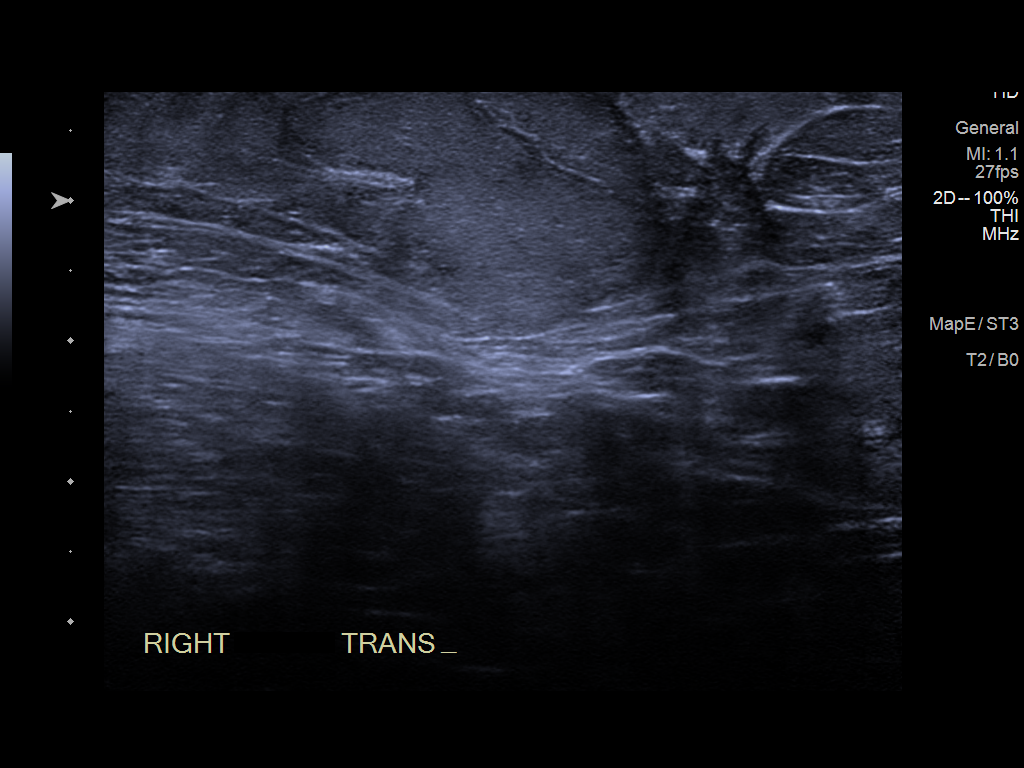

[3 of 3 positions shown; findings below may reference images not displayed]

FINDINGS: On physical exam, NO SUSPICIOUS LUMPS ARE IDENTIFIED.

Targeted ultrasound is performed, showing normal fat in the right
axilla with no suspicious masses or adenopathy.
IMPRESSION: No sonographic evidence of malignancy.

RECOMMENDATION:
Annual diagnostic mammography.

I have discussed the findings and recommendations with the patient.
If applicable, a reminder letter will be sent to the patient
regarding the next appointment.

BI-RADS CATEGORY  1: Negative.

## 2021-06-08 ENCOUNTER — Other Ambulatory Visit: Payer: Self-pay

## 2021-06-08 ENCOUNTER — Ambulatory Visit
Admission: RE | Admit: 2021-06-08 | Discharge: 2021-06-08 | Disposition: A | Discharge status: Home / Self Care | Payer: PPO | Source: Ambulatory Visit | Attending: Surgery | Admitting: Surgery

## 2021-06-08 DIAGNOSIS — Z1231 Encounter for screening mammogram for malignant neoplasm of breast: Secondary | ICD-10-CM | POA: Diagnosis not present

## 2021-06-09 ENCOUNTER — Telehealth: Payer: Self-pay

## 2021-06-09 NOTE — Telephone Encounter (Signed)
LVM for pt of normal results.

## 2021-06-19 ENCOUNTER — Ambulatory Visit: Payer: PPO | Admitting: Surgery

## 2021-07-03 DIAGNOSIS — J019 Acute sinusitis, unspecified: Secondary | ICD-10-CM | POA: Diagnosis not present

## 2021-07-03 DIAGNOSIS — J449 Chronic obstructive pulmonary disease, unspecified: Secondary | ICD-10-CM | POA: Diagnosis not present

## 2021-07-19 ENCOUNTER — Other Ambulatory Visit: Payer: Self-pay | Admitting: Family Medicine

## 2021-07-19 DIAGNOSIS — Z760 Encounter for issue of repeat prescription: Secondary | ICD-10-CM

## 2021-07-24 ENCOUNTER — Ambulatory Visit: Payer: PPO | Admitting: Surgery

## 2021-07-24 ENCOUNTER — Other Ambulatory Visit: Payer: Self-pay

## 2021-07-24 ENCOUNTER — Encounter: Payer: Self-pay | Admitting: Surgery

## 2021-07-24 VITALS — BP 147/84 | HR 80 | Temp 98.4°F | Ht 63.0 in | Wt 116.0 lb

## 2021-07-24 DIAGNOSIS — Z853 Personal history of malignant neoplasm of breast: Secondary | ICD-10-CM | POA: Diagnosis not present

## 2021-07-24 DIAGNOSIS — C50311 Malignant neoplasm of lower-inner quadrant of right female breast: Secondary | ICD-10-CM

## 2021-07-24 DIAGNOSIS — Z09 Encounter for follow-up examination after completed treatment for conditions other than malignant neoplasm: Secondary | ICD-10-CM | POA: Diagnosis not present

## 2021-07-24 DIAGNOSIS — Z170 Estrogen receptor positive status [ER+]: Secondary | ICD-10-CM

## 2021-07-24 NOTE — Patient Instructions (Addendum)
wE will call you in August to schedule your mammogram and follow up breast exam with Dr.Pabon. Please continue to check your breast monthly for any changes.  Please call our office if you have questions or concerns.   Breast Self-Awareness Breast self-awareness means being familiar with how your breasts look and feel. It involves checking your breasts regularly and reporting any changes to your health care provider. Practicing breast self-awareness is important. Sometimes changes may not be harmful (are benign), but sometimes a change in your breasts can be a sign of a serious medical problem. It is important to learn how to do this procedure correctly so that you can catch problems early, when treatment is more likely to be successful. All women should practice breast self-awareness, including women who have had breast implants. What you need: A mirror. A well-lit room. How to do a breast self-exam A breast self-exam is one way to learn what is normal for your breasts and whether your breasts are changing. To do a breast self-exam: Look for changes  Remove all the clothing above your waist. Stand in front of a mirror in a room with good lighting. Put your hands on your hips. Push your hands firmly downward. Compare your breasts in the mirror. Look for differences between them (asymmetry), such as: Differences in shape. Differences in size. Puckers, dips, and bumps in one breast and not the other. Look at each breast for changes in the skin, such as: Redness. Scaly areas. Look for changes in your nipples, such as: Discharge. Bleeding. Dimpling. Redness. A change in position. Feel for changes Carefully feel your breasts for lumps and changes. It is best to do this while lying on your back on the floor, and again while sitting or standing in the tub or shower with soapy water on your skin. Feel each breast in the following way: Place the arm on the side of the breast you are examining  above your head. Feel your breast with the other hand. Start in the nipple area and make -inch (2 cm) overlapping circles to feel your breast. Use the pads of your three middle fingers to do this. Apply light pressure, then medium pressure, then firm pressure. The light pressure will allow you to feel the tissue closest to the skin. The medium pressure will allow you to feel the tissue that is a little deeper. The firm pressure will allow you to feel the tissue close to the ribs. Continue the overlapping circles, moving downward over the breast until you feel your ribs below your breast. Move one finger-width toward the center of the body. Continue to use the -inch (2 cm) overlapping circles to feel your breast as you move slowly up toward your collarbone. Continue the up-and-down exam using all three pressures until you reach your armpit.  Write down what you find Writing down what you find can help you remember what to discuss with your health care provider. Write down: What is normal for each breast. Any changes that you find in each breast, including: The kind of changes you find. Any pain or tenderness. Size and location of any lumps. Where you are in your menstrual cycle, if you are still menstruating. General tips and recommendations Examine your breasts every month. If you are breastfeeding, the best time to examine your breasts is after a feeding or after using a breast pump. If you menstruate, the best time to examine your breasts is 5-7 days after your period. Breasts are generally lumpier  during menstrual periods, and it may be more difficult to notice changes. With time and practice, you will become more familiar with the variations in your breasts and more comfortable with the exam. Contact a health care provider if you: See a change in the shape or size of your breasts or nipples. See a change in the skin of your breast or nipples, such as a reddened or scaly area. Have  unusual discharge from your nipples. Find a lump or thick area that was not there before. Have pain in your breasts. Have any concerns related to your breast health. Summary Breast self-awareness includes looking for physical changes in your breasts, as well as feeling for any changes within your breasts. Breast self-awareness should be performed in front of a mirror in a well-lit room. You should examine your breasts every month. If you menstruate, the best time to examine your breasts is 5-7 days after your menstrual period. Let your health care provider know of any changes you notice in your breasts, including changes in size, changes on the skin, pain or tenderness, or unusual fluid from your nipples. This information is not intended to replace advice given to you by your health care provider. Make sure you discuss any questions you have with your health care provider. Document Revised: 05/06/2018 Document Reviewed: 05/06/2018 Elsevier Patient Education  [employer].

## 2021-07-26 ENCOUNTER — Other Ambulatory Visit: Payer: Self-pay | Admitting: Family Medicine

## 2021-07-26 ENCOUNTER — Other Ambulatory Visit: Payer: Self-pay | Admitting: Oncology

## 2021-07-27 NOTE — Progress Notes (Signed)
Outpatient Surgical Follow Up  07/27/2021  Tammy Choi is an 75 y.o. female.   Chief Complaint  Patient presents with   Follow-up    Bil mammo    HPI: Tammy Choi is a 75 year old female with a history of right breast cancer Invasive mammary ER + PR - HER Neg status post lumpectomy and radiation therapy in 2019. underwent XRT.  She denies any breast lumps.  No breast discharge.  No fevers no chills no weight loss.  she did have a mammo that I have personally reviewed showing no evidence of malignancy She is Arimidex  Past Medical History:  Diagnosis Date   Arthritis    Breast cancer ([place]) 05/07/2018   T1a, N0; ER/ PR positive, Her 2 neu not overexpressed.    COPD (chronic obstructive pulmonary disease) ([place])    Not on home o2   Depression    HTN (hypertension)    Hyperlipemia    Osteoporosis 07/28/2020   Personal history of radiation therapy     Past Surgical History:  Procedure Laterality Date   ABDOMINAL HYSTERECTOMY     BREAST BIOPSY Left 04/30/2013   neg core   BREAST BIOPSY Right 05/07/2018   right breast stereo x clip INVASIVE MAMMARY CARCINOMA   BREAST LUMPECTOMY Right 2019   BREAST LUMPECTOMY WITH SENTINEL LYMPH NODE BIOPSY Right 06/16/2018   Procedure: BREAST LUMPECTOMY WITH SENTINEL LYMPH NODE BX;  Surgeon: Robert Bellow, MD;  Location: [facility] ORS;  Service: General;  Laterality: Right;   COLONOSCOPY  2014   Dr Bary Castilla   Left Leg Surgery     TONSILLECTOMY     TOTAL HIP ARTHROPLASTY Right 03/29/2020   Procedure: TOTAL HIP ARTHROPLASTY;  Surgeon: Corky Mull, MD;  Location: [facility] ORS;  Service: Orthopedics;  Laterality: Right;    Family History  Problem Relation Age of Onset   Kidney disease Mother        deceased 41   Heart disease Father        deceased 62   Breast cancer Other 75       maternal half-sister; deceased 17   Colon cancer Neg Hx     Social History:  reports that she quit smoking about 5 years ago. Her smoking use included  cigarettes. She has a 35.00 pack-year smoking history. She has never used smokeless tobacco. She reports that she does not drink alcohol and does not use drugs.  Allergies:  Allergies  Allergen Reactions   Penicillins Anaphylaxis and Other (See Comments)    Has patient had a PCN reaction causing immediate rash, facial/tongue/throat swelling, SOB or lightheadedness with hypotension: Yes Has patient had a PCN reaction causing severe rash involving mucus membranes or skin necrosis: No Has patient had a PCN reaction that required hospitalization No Has patient had a PCN reaction occurring within the last 10 years: No If all of the above answers are "NO", then may proceed with Cephalosporin use.    Medications reviewed.    ROS Full ROS performed and is otherwise negative other than what is stated in HPI   BP (!) 147/84   Pulse 80   Temp 98.4 F (36.9 C) (Oral)   Ht 5\' 3"  (1.6 m)   Wt 116 lb (52.6 kg)   BMI 20.55 kg/m   Physical Exam Vitals and nursing note reviewed. Exam conducted with a chaperone present.  Constitutional:      General: She is not in acute distress.    Appearance: Normal appearance. She  is normal weight.  Eyes:     General: No scleral icterus.       Right eye: No discharge.        Left eye: No discharge.  Neck:     Vascular: No carotid bruit.  Cardiovascular:     Rate and Rhythm: Normal rate and regular rhythm.  Pulmonary:     Effort: Pulmonary effort is normal. No respiratory distress.     Breath sounds: Normal breath sounds. No stridor.     Comments: BREAST: Right lumpectomy scar and sentinel lymph node biopsy scar.  There is no evidence of new masses there is no evidence of skin ulcerations.  There are no evidence of axillary lymphadenopathy.   Abdominal:     General: Abdomen is flat. There is no distension.     Palpations: Abdomen is soft. There is no mass.     Tenderness: There is no abdominal tenderness. There is no right CVA tenderness, guarding or  rebound.     Hernia: No hernia is present.  Musculoskeletal:     Cervical back: Normal range of motion and neck supple. No rigidity or tenderness.  Lymphadenopathy:     Cervical: No cervical adenopathy.  Skin:    Capillary Refill: Capillary refill takes less than 2 seconds.  Neurological:     General: No focal deficit present.     Mental Status: She is alert and oriented to person, place, and time.  Psychiatric:        Mood and Affect: Mood normal.        Behavior: Behavior normal.        Thought Content: Thought content normal.        Judgment: Judgment normal.      Assessment/Plan: 75 year old female history of Invasive breast cancer without evidence of recurrence.  No need for further test or biopsies at this time.  We will continue yearly f/u per her wishes.  Greater than 50% of the 30 minutes  visit was spent in counseling/coordination of care   Caroleen Hamman, MD [place] [facility]

## 2021-08-09 DIAGNOSIS — I10 Essential (primary) hypertension: Secondary | ICD-10-CM | POA: Diagnosis not present

## 2021-08-09 DIAGNOSIS — N1831 Chronic kidney disease, stage 3a: Secondary | ICD-10-CM | POA: Diagnosis not present

## 2021-08-09 DIAGNOSIS — D631 Anemia in chronic kidney disease: Secondary | ICD-10-CM | POA: Diagnosis not present

## 2021-08-09 DIAGNOSIS — R809 Proteinuria, unspecified: Secondary | ICD-10-CM | POA: Diagnosis not present

## 2021-08-10 DIAGNOSIS — N281 Cyst of kidney, acquired: Secondary | ICD-10-CM | POA: Diagnosis not present

## 2021-08-10 DIAGNOSIS — D631 Anemia in chronic kidney disease: Secondary | ICD-10-CM | POA: Diagnosis not present

## 2021-08-10 DIAGNOSIS — R809 Proteinuria, unspecified: Secondary | ICD-10-CM | POA: Diagnosis not present

## 2021-08-10 DIAGNOSIS — I10 Essential (primary) hypertension: Secondary | ICD-10-CM | POA: Diagnosis not present

## 2021-08-10 DIAGNOSIS — N1831 Chronic kidney disease, stage 3a: Secondary | ICD-10-CM | POA: Diagnosis not present

## 2021-08-10 DIAGNOSIS — N1330 Unspecified hydronephrosis: Secondary | ICD-10-CM | POA: Diagnosis not present

## 2021-08-21 ENCOUNTER — Other Ambulatory Visit: Payer: Self-pay

## 2021-08-21 ENCOUNTER — Encounter: Payer: Self-pay | Admitting: Family Medicine

## 2021-08-21 ENCOUNTER — Ambulatory Visit (INDEPENDENT_AMBULATORY_CARE_PROVIDER_SITE_OTHER): Payer: PPO | Admitting: Family Medicine

## 2021-08-21 VITALS — BP 120/70 | HR 69 | Temp 98.1°F | Ht 63.0 in | Wt 119.8 lb

## 2021-08-21 DIAGNOSIS — E785 Hyperlipidemia, unspecified: Secondary | ICD-10-CM

## 2021-08-21 DIAGNOSIS — R634 Abnormal weight loss: Secondary | ICD-10-CM | POA: Diagnosis not present

## 2021-08-21 DIAGNOSIS — I10 Essential (primary) hypertension: Secondary | ICD-10-CM | POA: Diagnosis not present

## 2021-08-21 DIAGNOSIS — J449 Chronic obstructive pulmonary disease, unspecified: Secondary | ICD-10-CM | POA: Diagnosis not present

## 2021-08-21 DIAGNOSIS — Z20828 Contact with and (suspected) exposure to other viral communicable diseases: Secondary | ICD-10-CM | POA: Insufficient documentation

## 2021-08-21 DIAGNOSIS — N1831 Chronic kidney disease, stage 3a: Secondary | ICD-10-CM

## 2021-08-21 DIAGNOSIS — Z23 Encounter for immunization: Secondary | ICD-10-CM | POA: Diagnosis not present

## 2021-08-21 DIAGNOSIS — F411 Generalized anxiety disorder: Secondary | ICD-10-CM | POA: Diagnosis not present

## 2021-08-21 NOTE — Assessment & Plan Note (Signed)
Well-controlled on recheck.  She will continue hydrochlorothiazide 12.5 mg once daily and lisinopril 40 mg once daily.  Renal function recently checked with nephrology and was relatively stable.

## 2021-08-21 NOTE — Assessment & Plan Note (Addendum)
Even age and risk factors I discussed prophylaxis with Tamiflu though she declines this given the risk for neuropsychiatric issues with this medication.  She will monitor for symptoms and contact us right away if she develops flulike symptoms.

## 2021-08-21 NOTE — Assessment & Plan Note (Signed)
Well-controlled.  She will continue Symbicort 2 puffs twice daily.  She can use albuterol as needed.

## 2021-08-21 NOTE — Assessment & Plan Note (Signed)
Weight is generally stable.  We will continue to monitor this.

## 2021-08-21 NOTE — Assessment & Plan Note (Signed)
Well-controlled on last check earlier this year.  She will continue Crestor 5 mg once daily.

## 2021-08-21 NOTE — Assessment & Plan Note (Signed)
She continues to follow with nephrology.  Stable on most recent labs.

## 2021-08-21 NOTE — Progress Notes (Signed)
[name], MD Phone: [phone]  Tammy Choi is a 75 y.o. female who presents today for f/u.  HYPERTENSION Disease Monitoring Home BP Monitoring checks though can not remember her readings Chest pain- no    Dyspnea- no Medications Compliance-  taking HCTZ, lisinopril.  Edema- notes chronic stable left ankle edema that started 1.5 years ago when her hip was replaced.  BMET    Component Value Date/Time   NA 137 05/12/2021 1146   K 4.8 05/12/2021 1146   CL 102 05/12/2021 1146   CO2 27 05/12/2021 1146   GLUCOSE 121 (H) 05/12/2021 1146   BUN 40 (H) 05/12/2021 1146   CREATININE 1.28 (H) 05/12/2021 1146   CREATININE 0.91 08/01/2018 1458   CALCIUM 9.5 05/12/2021 1146   GFRNONAA 44 (L) 05/12/2021 1146   GFRAA 41 (L) 05/30/2020 1304   COPD: Medication compliance- taking symbicort daily  Rescue inhaler use- no Dyspnea- no  Wheezing- rare  Cough- rare    HYPERLIPIDEMIA Symptoms Chest pain on exertion:  no   Medications: Compliance- taking crestor Right upper quadrant pain- no  Muscle aches- no Lipid Panel     Component Value Date/Time   CHOL 137 04/18/2021 1126   TRIG 96.0 04/18/2021 1126   HDL 61.30 04/18/2021 1126   CHOLHDL 2 04/18/2021 1126   VLDL 19.2 04/18/2021 1126   LDLCALC 57 04/18/2021 1126   LDLCALC 70 03/14/2018 1459   LDLDIRECT 156.4 07/16/2012 0923   Anxiety: She feels as though this is somewhat better.  The shakiness has significantly improved.  She feels that the Celexa has been beneficial though she wonders about increasing the dose to get further benefit.  She notes no SI or HI.  No depression.  Flu exposure: Patient notes her granddaughter recently went to a birthday party and became ill and tested positive for the flu today.  The patient lives with them.  She has no symptoms.  Social History   Tobacco Use  Smoking Status Former   Packs/day: 1.00   Years: 35.00   Pack years: 35.00   Types: Cigarettes   Quit date: 12/20/2015   Years  since quitting: 5.6  Smokeless Tobacco Never    Current Outpatient Medications on File Prior to Visit  Medication Sig Dispense Refill   anastrozole (ARIMIDEX) 1 MG tablet Take 1 tablet by mouth once daily 90 tablet 0   budesonide-formoterol (SYMBICORT) 160-4.5 MCG/ACT inhaler Inhale 2 puffs by mouth twice daily 33 g 0   calcitRIOL (ROCALTROL) 0.25 MCG capsule Take 0.25 mcg by mouth daily.     Cholecalciferol 25 MCG (1000 UT) tablet Take by mouth.     citalopram (CELEXA) 20 MG tablet Take 1 tablet (20 mg total) by mouth daily. 90 tablet 1   hydrochlorothiazide (HYDRODIURIL) 12.5 MG tablet Take 1 tablet by mouth once daily 90 tablet 0   lisinopril (ZESTRIL) 40 MG tablet Take 1 tablet by mouth once daily 90 tablet 1   PROAIR HFA 108 (90 Base) MCG/ACT inhaler INHALE 2 PUFFS BY MOUTH EVERY 6 HOURS AS NEEDED FOR WHEEZING OR SHORTNESS OF BREATH (Patient taking differently: Inhale 1-2 puffs into the lungs as needed for wheezing or shortness of breath.) 9 g 0   rosuvastatin (CRESTOR) 5 MG tablet Take 1 tablet by mouth once daily 90 tablet 0   No current facility-administered medications on file prior to visit.     ROS see history of present illness  Objective  Physical Exam Vitals:   08/21/21 1044 08/21/21 1107  BP: 130/90 120/70  Pulse: 69   Temp: 98.1 F (36.7 C)   SpO2: 97%     BP Readings from Last 3 Encounters:  08/21/21 120/70  07/24/21 (!) 147/84  05/19/21 130/70   Wt Readings from Last 3 Encounters:  08/21/21 119 lb 12.8 oz (54.3 kg)  07/24/21 116 lb (52.6 kg)  05/19/21 121 lb 6.4 oz (55.1 kg)    Physical Exam Constitutional:      General: She is not in acute distress.    Appearance: She is not diaphoretic.  Cardiovascular:     Rate and Rhythm: Normal rate and regular rhythm.     Heart sounds: Normal heart sounds.  Pulmonary:     Effort: Pulmonary effort is normal.     Breath sounds: Normal breath sounds.  Musculoskeletal:     Right lower leg: No edema.      Left lower leg: No edema.  Skin:    General: Skin is warm and dry.  Neurological:     Mental Status: She is alert.     Assessment/Plan: Please see individual problem list.  Problem List Items Addressed This Visit     COPD (chronic obstructive pulmonary disease) ([place]) - Primary (Chronic)    Well-controlled.  She will continue Symbicort 2 puffs twice daily.  She can use albuterol as needed.      Anxiety state    Has had more improvement.  Discussed that Celexa 20 mg is the max dose for her age given QT prolongation issues.  Discussed the option of adding an additional medication though she declines this at this time.  She will follow-up in 3 months.  I did discuss the potential for adding BuSpar in the interim if she does not continue to improve.  She will continue Celexa 20 mg once daily.      Chronic kidney disease, stage 3a ([place])    She continues to follow with nephrology.  Stable on most recent labs.      Exposure to influenza    Even age and risk factors I discussed prophylaxis with Tamiflu though she declines this given the risk for neuropsychiatric issues with this medication.  She will monitor for symptoms and contact us right away if she develops flulike symptoms.      Hyperlipidemia    Well-controlled on last check earlier this year.  She will continue Crestor 5 mg once daily.      Hypertension    Well-controlled on recheck.  She will continue hydrochlorothiazide 12.5 mg once daily and lisinopril 40 mg once daily.  Renal function recently checked with nephrology and was relatively stable.      Weight loss    Weight is generally stable.  We will continue to monitor this.      Other Visit Diagnoses     Need for immunization against influenza       Relevant Orders   Flu Vaccine QUAD High Dose(Fluad) (Completed)        Return in about 3 months (around 11/21/2021) for Anxiety.  This visit occurred during the SARS-CoV-2 public health emergency.  Safety protocols  were in place, including screening questions prior to the visit, additional usage of staff PPE, and extensive cleaning of exam room while observing appropriate contact time as indicated for disinfecting solutions.    [name], MD [place]

## 2021-08-21 NOTE — Assessment & Plan Note (Signed)
Has had more improvement.  Discussed that Celexa 20 mg is the max dose for her age given QT prolongation issues.  Discussed the option of adding an additional medication though she declines this at this time.  She will follow-up in 3 months.  I did discuss the potential for adding BuSpar in the interim if she does not continue to improve.  She will continue Celexa 20 mg once daily.

## 2021-08-21 NOTE — Patient Instructions (Signed)
Nice to see you. Please let us know if you become ill with flulike symptoms. Please try to avoid your granddaughter. Please let me know if you would like to start buspirone before your next follow-up.  This would be for anxiety.

## 2021-10-20 ENCOUNTER — Other Ambulatory Visit: Payer: Self-pay | Admitting: Family Medicine

## 2021-10-20 DIAGNOSIS — Z760 Encounter for issue of repeat prescription: Secondary | ICD-10-CM

## 2021-10-29 ENCOUNTER — Other Ambulatory Visit: Payer: Self-pay | Admitting: Oncology

## 2021-11-06 ENCOUNTER — Other Ambulatory Visit: Payer: Self-pay | Admitting: *Deleted

## 2021-11-06 DIAGNOSIS — N189 Chronic kidney disease, unspecified: Secondary | ICD-10-CM

## 2021-11-06 DIAGNOSIS — D631 Anemia in chronic kidney disease: Secondary | ICD-10-CM

## 2021-11-13 ENCOUNTER — Inpatient Hospital Stay: Payer: PPO | Attending: Oncology

## 2021-11-13 ENCOUNTER — Other Ambulatory Visit: Payer: Self-pay

## 2021-11-13 ENCOUNTER — Inpatient Hospital Stay: Payer: PPO

## 2021-11-13 ENCOUNTER — Other Ambulatory Visit: Payer: Self-pay | Admitting: Oncology

## 2021-11-13 VITALS — BP 148/70 | HR 73

## 2021-11-13 DIAGNOSIS — M810 Age-related osteoporosis without current pathological fracture: Secondary | ICD-10-CM

## 2021-11-13 DIAGNOSIS — Z170 Estrogen receptor positive status [ER+]: Secondary | ICD-10-CM

## 2021-11-13 LAB — BASIC METABOLIC PANEL
Anion gap: 9 (ref 5–15)
BUN: 43 mg/dL — ABNORMAL HIGH (ref 8–23)
CO2: 26 mmol/L (ref 22–32)
Calcium: 9.2 mg/dL (ref 8.9–10.3)
Chloride: 102 mmol/L (ref 98–111)
Creatinine, Ser: 1.43 mg/dL — ABNORMAL HIGH (ref 0.44–1.00)
GFR, Estimated: 38 mL/min — ABNORMAL LOW (ref >60)
Glucose, Bld: 106 mg/dL — ABNORMAL HIGH (ref 70–99)
Potassium: 5.4 mmol/L — ABNORMAL HIGH (ref 3.5–5.1)
Sodium: 137 mmol/L (ref 135–145)

## 2021-11-13 MED ORDER — DENOSUMAB 60 MG/ML ~~LOC~~ SOSY
60.0000 mg | PREFILLED_SYRINGE | Freq: Once | SUBCUTANEOUS | Status: AC
  Administered 2021-11-13: 60 mg via SUBCUTANEOUS
  Filled 2021-11-13: qty 1

## 2021-11-14 ENCOUNTER — Other Ambulatory Visit: Payer: Self-pay | Admitting: Family Medicine

## 2021-11-14 DIAGNOSIS — E875 Hyperkalemia: Secondary | ICD-10-CM

## 2021-11-17 ENCOUNTER — Other Ambulatory Visit: Payer: PPO

## 2021-11-21 ENCOUNTER — Other Ambulatory Visit: Payer: Self-pay

## 2021-11-21 ENCOUNTER — Other Ambulatory Visit (INDEPENDENT_AMBULATORY_CARE_PROVIDER_SITE_OTHER): Payer: PPO

## 2021-11-21 DIAGNOSIS — E875 Hyperkalemia: Secondary | ICD-10-CM | POA: Diagnosis not present

## 2021-11-21 LAB — BASIC METABOLIC PANEL
BUN: 40 mg/dL — ABNORMAL HIGH (ref 6–23)
CO2: 30 mEq/L (ref 19–32)
Calcium: 9.3 mg/dL (ref 8.4–10.5)
Chloride: 106 mEq/L (ref 96–112)
Creatinine, Ser: 1.28 mg/dL — ABNORMAL HIGH (ref 0.40–1.20)
GFR: 40.73 mL/min — ABNORMAL LOW (ref >60.00)
Glucose, Bld: 63 mg/dL — ABNORMAL LOW (ref 70–99)
Potassium: 5.1 mEq/L (ref 3.5–5.1)
Sodium: 141 mEq/L (ref 135–145)

## 2021-11-24 ENCOUNTER — Other Ambulatory Visit: Payer: Self-pay

## 2021-11-24 ENCOUNTER — Encounter: Payer: Self-pay | Admitting: Family Medicine

## 2021-11-24 ENCOUNTER — Ambulatory Visit (INDEPENDENT_AMBULATORY_CARE_PROVIDER_SITE_OTHER): Payer: PPO | Admitting: Family Medicine

## 2021-11-24 VITALS — BP 118/80 | HR 81 | Temp 98.2°F | Ht 63.0 in

## 2021-11-24 DIAGNOSIS — R634 Abnormal weight loss: Secondary | ICD-10-CM | POA: Diagnosis not present

## 2021-11-24 DIAGNOSIS — F419 Anxiety disorder, unspecified: Secondary | ICD-10-CM

## 2021-11-24 DIAGNOSIS — N1831 Chronic kidney disease, stage 3a: Secondary | ICD-10-CM

## 2021-11-24 DIAGNOSIS — J449 Chronic obstructive pulmonary disease, unspecified: Secondary | ICD-10-CM

## 2021-11-24 DIAGNOSIS — I10 Essential (primary) hypertension: Secondary | ICD-10-CM

## 2021-11-24 DIAGNOSIS — F32A Depression, unspecified: Secondary | ICD-10-CM | POA: Diagnosis not present

## 2021-11-24 MED ORDER — BUSPIRONE HCL 5 MG PO TABS: 5.0000 mg | ORAL_TABLET | Freq: Two times a day (BID) | ORAL | 3 refills | Status: DC

## 2021-11-24 NOTE — Assessment & Plan Note (Signed)
Well-controlled.  She will continue lisinopril 40 mg once daily.

## 2021-11-24 NOTE — Assessment & Plan Note (Signed)
She will continue to see nephrology.  I encouraged adequate water intake given her elevated BUN/creatinine ratio.  Discussed 4 bottles of water daily.

## 2021-11-24 NOTE — Patient Instructions (Signed)
Nice to see you. I sent the buspirone in for you. If you notice any significant worsening anxiety or depression with starting this please discontinue it and let us know. Please use your Symbicort 2 puffs twice daily.

## 2021-11-24 NOTE — Assessment & Plan Note (Signed)
Some intermittent wheezing.  Her exam today is reassuring.  I encouraged her to use her Symbicort 2 puffs twice daily.  She can use albuterol as needed.  If she has any worsening symptoms she will certainly let us know.

## 2021-11-24 NOTE — Assessment & Plan Note (Signed)
The patient's weight has trended up.  Her weight loss previously was likely anxiety related and related to inadequate oral intake.  She will maintain adequate calorie intake.

## 2021-11-24 NOTE — Progress Notes (Signed)
[name], MD Phone: [phone]  Tammy Choi is a 76 y.o. female who presents today for f/u.  HYPERTENSION Disease Monitoring Home BP Monitoring <081 systolic Chest pain- no    Dyspnea- no Medications Compliance-  taking lisinopril. BMET    Component Value Date/Time   NA 141 11/21/2021 1420   K 5.1 11/21/2021 1420   CL 106 11/21/2021 1420   CO2 30 11/21/2021 1420   GLUCOSE 63 (L) 11/21/2021 1420   BUN 40 (H) 11/21/2021 1420   CREATININE 1.28 (H) 11/21/2021 1420   CREATININE 0.91 08/01/2018 1458   CALCIUM 9.3 11/21/2021 1420   GFRNONAA 38 (L) 11/13/2021 1235   GFRAA 41 (L) 05/30/2020 1304   COPD: Medication compliance- not taking symbicort as she should  Rescue inhaler use- not using Dyspnea- no  Wheezing- occasional  Cough- no    Anxiety/depression: Patient notes this continues to bother her though is generally better.  She notes she lost her best friend in January and then a couple weeks ago lost another friend.  She has had more anxiety than depression.  No recent suicidal ideation.  In the past when she was dealing with her kidney dysfunction she was having passive suicidal ideation.  The Celexa has been quite beneficial.  She is interested in starting on buspirone as we discussed at her last visit.  CKD stage III: BUN recently elevated.  She only drinks 2 bottles of water daily.  Weight loss: The patient has gained weight.  She has been eating a higher calorie diet.   Social History   Tobacco Use  Smoking Status Former   Packs/day: 1.00   Years: 35.00   Pack years: 35.00   Types: Cigarettes   Quit date: 12/20/2015   Years since quitting: 5.9  Smokeless Tobacco Never    Current Outpatient Medications on File Prior to Visit  Medication Sig Dispense Refill   anastrozole (ARIMIDEX) 1 MG tablet Take 1 tablet by mouth once daily 90 tablet 0   budesonide-formoterol (SYMBICORT) 160-4.5 MCG/ACT inhaler Inhale 2 puffs by mouth twice daily 33 g 0    calcitRIOL (ROCALTROL) 0.25 MCG capsule Take 0.25 mcg by mouth daily.     Cholecalciferol 25 MCG (1000 UT) tablet Take by mouth.     citalopram (CELEXA) 20 MG tablet Take 1 tablet (20 mg total) by mouth daily. 90 tablet 1   lisinopril (ZESTRIL) 40 MG tablet Take 1 tablet by mouth once daily 90 tablet 1   PROAIR HFA 108 (90 Base) MCG/ACT inhaler INHALE 2 PUFFS BY MOUTH EVERY 6 HOURS AS NEEDED FOR WHEEZING OR SHORTNESS OF BREATH (Patient taking differently: Inhale 1-2 puffs into the lungs as needed for wheezing or shortness of breath.) 9 g 0   rosuvastatin (CRESTOR) 5 MG tablet Take 1 tablet by mouth once daily 90 tablet 0   hydrochlorothiazide (HYDRODIURIL) 12.5 MG tablet Take 1 tablet by mouth once daily (Patient not taking: Reported on 11/23/2021) 90 tablet 0   No current facility-administered medications on file prior to visit.     ROS see history of present illness  Objective  Physical Exam Vitals:   11/24/21 1019  BP: 118/80  Pulse: 81  Temp: 98.2 F (36.8 C)  SpO2: 95%    BP Readings from Last 3 Encounters:  11/24/21 118/80  11/13/21 (!) 148/70  08/21/21 120/70   Wt Readings from Last 3 Encounters:  08/21/21 119 lb 12.8 oz (54.3 kg)  07/24/21 116 lb (52.6 kg)  05/19/21 121 lb 6.4  oz (55.1 kg)    Physical Exam Constitutional:      General: She is not in acute distress.    Appearance: She is not diaphoretic.  Cardiovascular:     Rate and Rhythm: Normal rate and regular rhythm.     Heart sounds: Normal heart sounds.  Pulmonary:     Effort: Pulmonary effort is normal.     Breath sounds: Normal breath sounds.  Skin:    General: Skin is warm and dry.  Neurological:     Mental Status: She is alert.     Assessment/Plan: Please see individual problem list.  Problem List Items Addressed This Visit     COPD (chronic obstructive pulmonary disease) ([place]) (Chronic)    Some intermittent wheezing.  Her exam today is reassuring.  I encouraged her to use her Symbicort 2  puffs twice daily.  She can use albuterol as needed.  If she has any worsening symptoms she will certainly let us know.      Anxiety and depression    The patient has ongoing issues with this.  She mostly has anxiety.  She will continue Celexa 20 mg once daily.  We will add buspirone 5 mg twice daily.  Discussed this is a low-dose and she may not notice any benefit for up to 2 months.  If she has not noticed any benefit by 4 weeks she can contact me and we will increase the dose at that time.  She was advised that her anxiety or depression could worsen slightly with adding new medication.  If she has significant worsening of her anxiety or depression she will discontinue the medicine and let us know.  She will monitor for any side effects.  She will follow-up with me in 8 weeks.      Relevant Medications   busPIRone (BUSPAR) 5 MG tablet   Chronic kidney disease, stage 3a ([place])    She will continue to see nephrology.  I encouraged adequate water intake given her elevated BUN/creatinine ratio.  Discussed 4 bottles of water daily.      Hypertension - Primary    Well-controlled.  She will continue lisinopril 40 mg once daily.      Weight loss    The patient's weight has trended up.  Her weight loss previously was likely anxiety related and related to inadequate oral intake.  She will maintain adequate calorie intake.       Return in about 8 weeks (around 01/19/2022) for Anxiety/depression.  This visit occurred during the SARS-CoV-2 public health emergency.  Safety protocols were in place, including screening questions prior to the visit, additional usage of staff PPE, and extensive cleaning of exam room while observing appropriate contact time as indicated for disinfecting solutions.    [name], MD [place]

## 2021-11-24 NOTE — Assessment & Plan Note (Addendum)
The patient has ongoing issues with this.  She mostly has anxiety.  She will continue Celexa 20 mg once daily.  We will add buspirone 5 mg twice daily.  Discussed this is a low-dose and she may not notice any benefit for up to 2 months.  If she has not noticed any benefit by 4 weeks she can contact me and we will increase the dose at that time.  She was advised that her anxiety or depression could worsen slightly with adding new medication.  If she has significant worsening of her anxiety or depression she will discontinue the medicine and let us know.  She will monitor for any side effects.  She will follow-up with me in 8 weeks.

## 2021-11-28 ENCOUNTER — Other Ambulatory Visit: Payer: Self-pay | Admitting: Family Medicine

## 2021-12-20 DIAGNOSIS — N281 Cyst of kidney, acquired: Secondary | ICD-10-CM | POA: Diagnosis not present

## 2021-12-20 DIAGNOSIS — I10 Essential (primary) hypertension: Secondary | ICD-10-CM | POA: Diagnosis not present

## 2021-12-20 DIAGNOSIS — N1831 Chronic kidney disease, stage 3a: Secondary | ICD-10-CM | POA: Diagnosis not present

## 2021-12-20 DIAGNOSIS — R809 Proteinuria, unspecified: Secondary | ICD-10-CM | POA: Diagnosis not present

## 2021-12-20 DIAGNOSIS — D631 Anemia in chronic kidney disease: Secondary | ICD-10-CM | POA: Diagnosis not present

## 2021-12-20 DIAGNOSIS — N1330 Unspecified hydronephrosis: Secondary | ICD-10-CM | POA: Diagnosis not present

## 2021-12-21 ENCOUNTER — Other Ambulatory Visit: Payer: Self-pay | Admitting: Family Medicine

## 2021-12-21 DIAGNOSIS — F411 Generalized anxiety disorder: Secondary | ICD-10-CM

## 2022-01-18 ENCOUNTER — Other Ambulatory Visit: Payer: Self-pay | Admitting: Family Medicine

## 2022-01-18 ENCOUNTER — Other Ambulatory Visit: Payer: Self-pay | Admitting: Oncology

## 2022-01-18 DIAGNOSIS — Z760 Encounter for issue of repeat prescription: Secondary | ICD-10-CM

## 2022-01-22 ENCOUNTER — Other Ambulatory Visit: Payer: Self-pay | Admitting: Oncology

## 2022-01-24 ENCOUNTER — Ambulatory Visit: Payer: PPO | Admitting: Family Medicine

## 2022-02-06 ENCOUNTER — Ambulatory Visit (INDEPENDENT_AMBULATORY_CARE_PROVIDER_SITE_OTHER): Payer: PPO | Admitting: Family Medicine

## 2022-02-06 ENCOUNTER — Encounter: Payer: Self-pay | Admitting: Family Medicine

## 2022-02-06 VITALS — BP 100/60 | HR 79 | Temp 98.7°F | Ht 63.0 in | Wt 145.0 lb

## 2022-02-06 DIAGNOSIS — I10 Essential (primary) hypertension: Secondary | ICD-10-CM

## 2022-02-06 DIAGNOSIS — J449 Chronic obstructive pulmonary disease, unspecified: Secondary | ICD-10-CM

## 2022-02-06 DIAGNOSIS — I7300 Raynaud's syndrome without gangrene: Secondary | ICD-10-CM

## 2022-02-06 DIAGNOSIS — F419 Anxiety disorder, unspecified: Secondary | ICD-10-CM | POA: Diagnosis not present

## 2022-02-06 DIAGNOSIS — F32A Depression, unspecified: Secondary | ICD-10-CM | POA: Diagnosis not present

## 2022-02-06 MED ORDER — LISINOPRIL 40 MG PO TABS: 40.0000 mg | ORAL_TABLET | Freq: Every day | ORAL | 1 refills | Status: DC

## 2022-02-06 NOTE — Assessment & Plan Note (Signed)
Generally improved.  I did discussed adding another inhaler such as Incruse to her regimen though she defers this at this time.  She will continue Symbicort 2 puffs twice daily. ?

## 2022-02-06 NOTE — Progress Notes (Signed)
?[name], MD ?Phone: [phone] ? ?DINITA MIGLIACCIO is a 76 y.o. female who presents today for f/u. ? ?Anxiety/depression: Patient notes this is quite a bit better.  She notes no side effects with the BuSpar or Celexa.  She notes no SI. ? ?Raynaud's: Patient think she has Raynaud's.  She notes for the last year when her hands get cold her fingertips will turn white and then turn blue.  She notes occasionally the tip of her nose will be blue.  She notes her mother had this. ? ?COPD: Patient notes feeling quite a bit better on the Symbicort.  She notes she still has some shortness of breath though it is improved.  She notes rare wheezing.  She notes occasional mucus congestion related to this. ? ?Social History  ? ?Tobacco Use  ?Smoking Status Former  ? Packs/day: 1.00  ? Years: 35.00  ? Pack years: 35.00  ? Types: Cigarettes  ? Quit date: 12/20/2015  ? Years since quitting: 6.1  ?Smokeless Tobacco Never  ? ? ?Current Outpatient Medications on File Prior to Visit  ?Medication Sig Dispense Refill  ? anastrozole (ARIMIDEX) 1 MG tablet Take 1 tablet by mouth once daily 90 tablet 0  ? budesonide-formoterol (SYMBICORT) 160-4.5 MCG/ACT inhaler Inhale 2 puffs by mouth twice daily 33 g 0  ? busPIRone (BUSPAR) 5 MG tablet Take 1 tablet (5 mg total) by mouth 2 (two) times daily. 60 tablet 3  ? calcitRIOL (ROCALTROL) 0.25 MCG capsule Take 0.25 mcg by mouth daily.    ? Cholecalciferol 25 MCG (1000 UT) tablet Take by mouth.    ? citalopram (CELEXA) 20 MG tablet Take 1 tablet by mouth once daily 90 tablet 1  ? hydrochlorothiazide (HYDRODIURIL) 12.5 MG tablet Take 1 tablet by mouth once daily 90 tablet 0  ? PROAIR HFA 108 (90 Base) MCG/ACT inhaler INHALE 2 PUFFS BY MOUTH EVERY 6 HOURS AS NEEDED FOR WHEEZING OR SHORTNESS OF BREATH (Patient taking differently: Inhale 1-2 puffs into the lungs as needed for wheezing or shortness of breath.) 9 g 0  ? rosuvastatin (CRESTOR) 5 MG tablet Take 1 tablet by mouth once daily 90  tablet 0  ? ?No current facility-administered medications on file prior to visit.  ? ? ? ?ROS see history of present illness ? ?Objective ? ?Physical Exam ?Vitals:  ? 02/06/22 1045  ?BP: 100/60  ?Pulse: 79  ?Temp: 98.7 ?F (37.1 ?C)  ?SpO2: 96%  ? ? ?BP Readings from Last 3 Encounters:  ?02/06/22 100/60  ?11/24/21 118/80  ?11/13/21 (!) 148/70  ? ?Wt Readings from Last 3 Encounters:  ?02/06/22 145 lb (65.8 kg)  ?08/21/21 119 lb 12.8 oz (54.3 kg)  ?07/24/21 116 lb (52.6 kg)  ? ? ?Physical Exam ?Constitutional:   ?   General: She is not in acute distress. ?   Appearance: She is not diaphoretic.  ?Cardiovascular:  ?   Rate and Rhythm: Normal rate and regular rhythm.  ?   Heart sounds: Normal heart sounds.  ?Pulmonary:  ?   Effort: Pulmonary effort is normal.  ?   Breath sounds: Normal breath sounds.  ?Musculoskeletal:  ?   Comments: Bilateral hands are warm and well-perfused, 2+ radial pulses  ?Skin: ?   General: Skin is warm and dry.  ?Neurological:  ?   Mental Status: She is alert.  ? ? ? ?Assessment/Plan: Please see individual problem list. ? ?Problem List Items Addressed This Visit   ? ? Anxiety and depression (Chronic)  ?  Much improved.  She will continue BuSpar 5 mg twice daily and Celexa 20 mg daily. ? ?  ?  ? COPD (chronic obstructive pulmonary disease) ([facility]) (Chronic)  ?  Generally improved.  I did discussed adding another inhaler such as Incruse to her regimen though she defers this at this time.  She will continue Symbicort 2 puffs twice daily. ? ?  ?  ? Raynaud's phenomenon (Chronic)  ?  Discussed that she likely has Raynaud's phenomenon.  Discussed the potential for a calcium channel blocker though given her borderline low blood pressure that would not be an option today.  Discussed potential for further work-up for other underlying causes though she defers this.  I encouraged her to keep her hands as warm as possible.  If she develops any ulcers or changes to this she will let us know. ? ?  ?  ?  Hypertension - Primary  ? Relevant Medications  ? lisinopril (ZESTRIL) 40 MG tablet  ? ? ? ?Return in about 3 months (around 05/09/2022) for Anxiety/depression. ? ? ?[name], MD ?[place] ? ?

## 2022-02-06 NOTE — Assessment & Plan Note (Signed)
Discussed that she likely has Raynaud's phenomenon.  Discussed the potential for a calcium channel blocker though given her borderline low blood pressure that would not be an option today.  Discussed potential for further work-up for other underlying causes though she defers this.  I encouraged her to keep her hands as warm as possible.  If she develops any ulcers or changes to this she will let us know. ?

## 2022-02-06 NOTE — Patient Instructions (Signed)
Nice to see you. ?Please monitor your hands and if you notice any changes please let us know. ?

## 2022-02-06 NOTE — Assessment & Plan Note (Signed)
Much improved.  She will continue BuSpar 5 mg twice daily and Celexa 20 mg daily. ?

## 2022-03-07 ENCOUNTER — Telehealth: Payer: Self-pay | Admitting: Family Medicine

## 2022-03-07 NOTE — Telephone Encounter (Signed)
Copied from [place] [id number]. Topic: Medicare AWV >> Mar 07, 2022 10:42 AM Harris-Coley, Hannah Beat wrote: Reason for CRM: Left message for patient to schedule Annual Wellness Visit.  Please schedule with Nurse Health Advisor Denisa O'Brien-Blaney, LPN at [facility].  Please call [phone] ask for [facility]

## 2022-03-13 ENCOUNTER — Ambulatory Visit (INDEPENDENT_AMBULATORY_CARE_PROVIDER_SITE_OTHER): Payer: PPO

## 2022-03-13 VITALS — Ht 63.0 in | Wt 145.0 lb

## 2022-03-13 DIAGNOSIS — Z0000 Encounter for general adult medical examination without abnormal findings: Secondary | ICD-10-CM | POA: Diagnosis not present

## 2022-03-13 NOTE — Progress Notes (Signed)
Subjective:   Tammy Choi is a 76 y.o. female who presents for Medicare Annual (Subsequent) preventive examination.  Review of Systems    No ROS.  Medicare Wellness Virtual Visit.  Visual/audio telehealth visit, UTA vital signs.   See social history for additional risk factors.   Cardiac Risk Factors include: advanced age (>59mn, >>31women);hypertension     Objective:    There were no vitals filed for this visit. There is no height or weight on file to calculate BMI.     03/13/2022    9:53 AM 05/12/2021   12:08 PM 04/05/2021   10:30 AM 03/10/2021    2:32 PM 10/14/2020   11:33 AM 10/14/2020   11:28 AM 07/28/2020    9:41 AM  Advanced Directives  Does Patient Have a Medical Advance Directive? No No No No No No No  Would patient like information on creating a medical advance directive? No - Patient declined   No - Patient declined No - Patient declined      Current Medications (verified) Outpatient Encounter Medications as of 03/13/2022  Medication Sig   anastrozole (ARIMIDEX) 1 MG tablet Take 1 tablet by mouth once daily   budesonide-formoterol (SYMBICORT) 160-4.5 MCG/ACT inhaler Inhale 2 puffs by mouth twice daily   busPIRone (BUSPAR) 5 MG tablet Take 1 tablet (5 mg total) by mouth 2 (two) times daily.   calcitRIOL (ROCALTROL) 0.25 MCG capsule Take 0.25 mcg by mouth daily.   Cholecalciferol 25 MCG (1000 UT) tablet Take by mouth.   citalopram (CELEXA) 20 MG tablet Take 1 tablet by mouth once daily   hydrochlorothiazide (HYDRODIURIL) 12.5 MG tablet Take 1 tablet by mouth once daily   lisinopril (ZESTRIL) 40 MG tablet Take 1 tablet (40 mg total) by mouth daily.   PROAIR HFA 108 (90 Base) MCG/ACT inhaler INHALE 2 PUFFS BY MOUTH EVERY 6 HOURS AS NEEDED FOR WHEEZING OR SHORTNESS OF BREATH (Patient taking differently: Inhale 1-2 puffs into the lungs as needed for wheezing or shortness of breath.)   rosuvastatin (CRESTOR) 5 MG tablet Take 1 tablet by mouth once daily   No  facility-administered encounter medications on file as of 03/13/2022.    Allergies (verified) Penicillins   History: Past Medical History:  Diagnosis Date   Arthritis    Breast cancer ([place] 05/07/[place]   T1a, N0; ER/ PR positive, Her 2 neu not overexpressed.    COPD (chronic obstructive pulmonary disease) ([place]    Not on home o2   Depression    HTN (hypertension)    Hyperlipemia    Osteoporosis 07/28/2020   Personal history of radiation therapy    Past Surgical History:  Procedure Laterality Date   ABDOMINAL HYSTERECTOMY     BREAST BIOPSY Left 04/30/2013   neg core   BREAST BIOPSY Right 05/07/2018   right breast stereo x clip INVASIVE MAMMARY CARCINOMA   BREAST LUMPECTOMY Right 2019   BREAST LUMPECTOMY WITH SENTINEL LYMPH NODE BIOPSY Right 06/16/2018   Procedure: BREAST LUMPECTOMY WITH SENTINEL LYMPH NODE BX;  Surgeon: BRobert Bellow MD;  Location: [facility] ORS;  Service: General;  Laterality: Right;   COLONOSCOPY  2014   Dr BBary Castilla  Left Leg Surgery     TONSILLECTOMY     TOTAL HIP ARTHROPLASTY Right 03/29/2020   Procedure: TOTAL HIP ARTHROPLASTY;  Surgeon: PCorky Mull MD;  Location: [facility] ORS;  Service: Orthopedics;  Laterality: Right;   Family History  Problem Relation Age of Onset   Kidney disease Mother  deceased 76   Heart disease Father        deceased 73   Breast cancer Other 91       maternal half-sister; deceased 60   Colon cancer Neg Hx    Social History   Socioeconomic History   Marital status: Divorced    Spouse name: Not on file   Number of children: Not on file   Years of education: Not on file   Highest education level: Not on file  Occupational History   Occupation: Retired Database administrator: retired  Tobacco Use   Smoking status: Former    Packs/day: 1.00    Years: 35.00    Total pack years: 35.00    Types: Cigarettes    Quit date: 12/20/2015    Years since quitting: 6.2   Smokeless tobacco: Never  Vaping Use    Vaping Use: Never used  Substance and Sexual Activity   Alcohol use: No    Alcohol/week: 0.0 standard drinks of alcohol   Drug use: No   Sexual activity: Not on file  Other Topics Concern   Not on file  Social History Narrative   Lives with daughter at home. Independent at baseline.   Social Determinants of Health   Financial Resource Strain: Low Risk  (03/13/2022)   Overall Financial Resource Strain (CARDIA)    Difficulty of Paying Living Expenses: Not hard at all  Food Insecurity: No Food Insecurity (03/13/2022)   Hunger Vital Sign    Worried About Running Out of Food in the Last Year: Never true    Ran Out of Food in the Last Year: Never true  Transportation Needs: No Transportation Needs (03/13/2022)   PRAPARE - Hydrologist (Medical): No    Lack of Transportation (Non-Medical): No  Physical Activity: Not on file  Stress: No Stress Concern Present (03/13/2022)   [place]    Feeling of Stress : Not at all  Social Connections: Unknown (03/13/2022)   Social Connection and Isolation Panel [NHANES]    Frequency of Communication with Friends and Family: Not on file    Frequency of Social Gatherings with Friends and Family: More than three times a week    Attends Religious Services: Not on Advertising copywriter or Organizations: Not on file    Attends Archivist Meetings: Not on file    Marital Status: Not on file    Tobacco Counseling Counseling given: Not Answered   Clinical Intake:  Pre-visit preparation completed: Yes        Diabetes: No  How often do you need to have someone help you when you read instructions, pamphlets, or other written materials from your doctor or pharmacy?: 1 - Never   Interpreter Needed?: No    Activities of Daily Living    03/13/2022    9:53 AM  In your present state of health, do you have any difficulty performing the  following activities:  Hearing? 0  Vision? 0  Difficulty concentrating or making decisions? 0  Walking or climbing stairs? 0  Dressing or bathing? 0  Doing errands, shopping? 1  Comment [place] and eating ? N  Using the Toilet? N  In the past six months, have you accidently leaked urine? Y  Comment Followed by Nephrology. Wears daily liner/pad.  Do you have problems with loss of bowel control? N  Managing  your Medications? N  Managing your Finances? N  Housekeeping or managing your Housekeeping? N    Patient Care Team: Leone Haven, MD as PCP - General (Family Medicine) Bary Castilla [name], MD (General Surgery)  Indicate any recent Medical Services you may have received from other than [facility] providers in the past year (date may be approximate).     Assessment:   This is a routine wellness examination for Tammy Choi.  Virtual Visit via Telephone Note  I connected with  Tammy Choi on 03/13/22 at  9:45 AM EDT by telephone and verified that I am speaking with the correct person using two identifiers.  Persons participating in the virtual visit: patient/Nurse Health Advisor   I discussed the limitations of performing an evaluation and management service by telehealth. We continued and completed visit with audio only. Some vital signs may be absent or patient reported.   Hearing/Vision screen Hearing Screening - Comments:: Patient is able to hear conversational tones without difficulty. No issues reported. Vision Screening - Comments:: Wears corrective lenses  They have seen their ophthalmologist in the last 12 months.   Dietary issues and exercise activities discussed: Current Exercise Habits: Home exercise routine, Type of exercise: walking, Time (Minutes): 20, Frequency (Times/Week): 3, Weekly Exercise (Minutes/Week): 60, Intensity: Mild Healthy diet Good water intake   Goals Addressed               This Visit's Progress     Patient  Stated     Follow up with Primary Care Provider (pt-stated)        As needed.       Depression Screen    03/13/2022    9:51 AM 02/06/2022   10:52 AM 11/23/2021    3:39 PM 08/21/2021   10:46 AM 03/10/2021    2:17 PM 03/09/2020    2:21 PM 10/23/2019    2:44 PM  PHQ 2/9 Scores  PHQ - 2 Score 0 0 2 0 0 0 0  PHQ- 9 Score   3        Fall Risk    03/13/2022    9:52 AM 02/06/2022   10:52 AM 11/23/2021    3:39 PM 08/21/2021   10:45 AM 07/24/2021   10:22 AM  Fall Risk   Falls in the past year? 0 0 0 0 0  Number falls in past yr: 0 0  0   Injury with Fall?  0  0   Risk for fall due to :  No Fall Risks No Fall Risks No Fall Risks   Follow up Falls evaluation completed Falls evaluation completed Falls evaluation completed Falls evaluation completed    [place]: Home free of loose throw rugs in walkways, pet beds, electrical cords, etc? Yes  Adequate lighting in your home to reduce risk of falls? Yes   ASSISTIVE DEVICES UTILIZED TO PREVENT FALLS: Life alert? No  Use of a cane, walker or w/c? No   TIMED UP AND GO: Was the test performed? No .   Cognitive Function:  Patient is alert and oriented x3.       03/10/2021    2:20 PM 03/09/2020    2:49 PM 03/03/2019    9:47 AM  6CIT Screen  What Year? 0 points 0 points 0 points  What month? 0 points 0 points 0 points  What time? 0 points  0 points  Count back from 20   0 points  Months in reverse  0 points 0 points 0 points  Repeat phrase  4 points 0 points  Total Score   0 points    Immunizations Immunization History  Administered Date(s) Administered   Fluad Quad(high Dose 65+) 06/01/2019, 07/14/2020, 08/21/2021   Influenza Split 08/07/2011, 07/16/2012   Influenza, High Dose Seasonal PF 07/10/2016, 08/01/2017, 07/10/2018   Influenza,inj,Quad PF,6+ Mos 07/30/2014, 07/21/2015   Influenza-Unspecified 07/27/2013   Pneumococcal Conjugate-13 03/30/2014   Pneumococcal Polysaccharide-23 07/16/2010    TDAP status: Due, Education has been provided regarding the importance of this vaccine. Advised may receive this vaccine at local pharmacy or Health Dept. Aware to provide a copy of the vaccination record if obtained from local pharmacy or Health Dept. Verbalized acceptance and understanding.  Covid vaccine- reports 2 completed. Agrees to update immunization record next office visit.   Shingrix Completed?: No.    Education has been provided regarding the importance of this vaccine. Patient has been advised to call insurance company to determine out of pocket expense if they have not yet received this vaccine. Advised may also receive vaccine at local pharmacy or Health Dept. Verbalized acceptance and understanding.  Screening Tests Health Maintenance  Topic Date Due   COVID-19 Vaccine (1) 05/10/2022 (Originally 01/09/1947)   Zoster Vaccines- Shingrix (1 of 2) 06/13/2022 (Originally 07/11/1965)   TETANUS/TDAP  03/14/2023 (Originally 07/11/1965)   INFLUENZA VACCINE  05/01/2022   Pneumonia Vaccine 49+ Years old  Completed   DEXA SCAN  Completed   Hepatitis C Screening  Completed   HPV VACCINES  Aged Out   COLONOSCOPY (Pts 45-44yr Insurance coverage will need to be confirmed)  Discontinued   Health Maintenance There are no preventive care reminders to display for this patient.  Mammogram- completed 06/08/21.   Lung Cancer Screening: (Low Dose CT Chest recommended if Age 77-80years, 30 pack-year currently smoking OR have quit w/in 15years.) does not qualify.   Vision Screening: Recommended annual ophthalmology exams for early detection of glaucoma and other disorders of the eye.  Dental Screening: Recommended annual dental exams for proper oral hygiene  Community Resource Referral / Chronic Care Management: CRR required this visit?  No   CCM required this visit?  No      Plan:   Keep all routine maintenance appointments.   I have personally reviewed and noted the following in  the patient's chart:   Medical and social history Use of alcohol, tobacco or illicit drugs  Current medications and supplements including opioid prescriptions.  Functional ability and status Nutritional status Physical activity Advanced directives List of other physicians Hospitalizations, surgeries, and ER visits in previous 12 months Vitals Screenings to include cognitive, depression, and falls Referrals and appointments  In addition, I have reviewed and discussed with patient certain preventive protocols, quality metrics, and best practice recommendations. A written personalized care plan for preventive services as well as general preventive health recommendations were provided to patient.     OVarney Biles LPN   [id number]

## 2022-03-13 NOTE — Patient Instructions (Addendum)
  Tammy Choi , Thank you for taking time to come for your Medicare Wellness Visit. I appreciate your ongoing commitment to your health goals. Please review the following plan we discussed and let me know if I can assist you in the future.   These are the goals we discussed:  Goals       Patient Stated     Follow up with Primary Care Provider (pt-stated)      As needed.        This is a list of the screening recommended for you and due dates:  Health Maintenance  Topic Date Due   COVID-19 Vaccine (1) 05/10/2022*   Zoster (Shingles) Vaccine (1 of 2) 06/13/2022*   Tetanus Vaccine  03/14/2023*   Flu Shot  05/01/2022   Pneumonia Vaccine  Completed   DEXA scan (bone density measurement)  Completed   Hepatitis C Screening: USPSTF Recommendation to screen - Ages 31-79 yo.  Completed   HPV Vaccine  Aged Out   Colon Cancer Screening  Discontinued  *Topic was postponed. The date shown is not the original due date.

## 2022-03-15 ENCOUNTER — Other Ambulatory Visit: Payer: Self-pay | Admitting: Family Medicine

## 2022-03-15 DIAGNOSIS — F419 Anxiety disorder, unspecified: Secondary | ICD-10-CM

## 2022-03-19 DIAGNOSIS — R809 Proteinuria, unspecified: Secondary | ICD-10-CM | POA: Diagnosis not present

## 2022-03-19 DIAGNOSIS — N1330 Unspecified hydronephrosis: Secondary | ICD-10-CM | POA: Diagnosis not present

## 2022-03-19 DIAGNOSIS — N281 Cyst of kidney, acquired: Secondary | ICD-10-CM | POA: Diagnosis not present

## 2022-03-19 DIAGNOSIS — I10 Essential (primary) hypertension: Secondary | ICD-10-CM | POA: Diagnosis not present

## 2022-03-22 DIAGNOSIS — N281 Cyst of kidney, acquired: Secondary | ICD-10-CM | POA: Diagnosis not present

## 2022-03-22 DIAGNOSIS — D631 Anemia in chronic kidney disease: Secondary | ICD-10-CM | POA: Diagnosis not present

## 2022-03-22 DIAGNOSIS — R809 Proteinuria, unspecified: Secondary | ICD-10-CM | POA: Diagnosis not present

## 2022-03-22 DIAGNOSIS — I10 Essential (primary) hypertension: Secondary | ICD-10-CM | POA: Diagnosis not present

## 2022-03-22 DIAGNOSIS — N1831 Chronic kidney disease, stage 3a: Secondary | ICD-10-CM | POA: Diagnosis not present

## 2022-03-22 DIAGNOSIS — N1330 Unspecified hydronephrosis: Secondary | ICD-10-CM | POA: Diagnosis not present

## 2022-04-08 ENCOUNTER — Other Ambulatory Visit: Payer: Self-pay | Admitting: Family

## 2022-04-08 DIAGNOSIS — F32A Depression, unspecified: Secondary | ICD-10-CM

## 2022-04-11 ENCOUNTER — Ambulatory Visit
Admission: RE | Admit: 2022-04-11 | Discharge: 2022-04-11 | Disposition: A | Discharge status: Home / Self Care | Payer: PPO | Source: Ambulatory Visit | Attending: Radiation Oncology | Admitting: Radiation Oncology

## 2022-04-11 ENCOUNTER — Encounter: Payer: Self-pay | Admitting: Radiation Oncology

## 2022-04-11 VITALS — BP 147/85 | HR 72 | Temp 97.7°F | Ht 63.0 in | Wt 146.0 lb

## 2022-04-11 DIAGNOSIS — C50311 Malignant neoplasm of lower-inner quadrant of right female breast: Secondary | ICD-10-CM | POA: Diagnosis not present

## 2022-04-11 DIAGNOSIS — Z79811 Long term (current) use of aromatase inhibitors: Secondary | ICD-10-CM | POA: Insufficient documentation

## 2022-04-11 DIAGNOSIS — Z08 Encounter for follow-up examination after completed treatment for malignant neoplasm: Secondary | ICD-10-CM | POA: Diagnosis not present

## 2022-04-11 DIAGNOSIS — Z170 Estrogen receptor positive status [ER+]: Secondary | ICD-10-CM | POA: Diagnosis not present

## 2022-04-11 DIAGNOSIS — Z923 Personal history of irradiation: Secondary | ICD-10-CM | POA: Insufficient documentation

## 2022-04-11 DIAGNOSIS — N19 Unspecified kidney failure: Secondary | ICD-10-CM | POA: Diagnosis not present

## 2022-04-11 NOTE — Progress Notes (Signed)
Radiation Oncology Follow up Note  Name: Tammy Choi   Date:   04/11/2022 MRN:  [id number] DOB: 08/21/1946    This 76 y.o. female presents to the clinic today for greater than 3-year follow-up status post whole breast radiation to her right breast for stage Ia ER/PR positive invasive mammary carcinoma.  REFERRING PROVIDER: Leone Haven, MD  HPI: Patient is a 76 year old female now at over 3 years having completed whole breast radiation to her right breast for stage I invasive mammary carcinoma ER/PR positive.  Seen today in routine follow-up she is doing well.  She specifically denies breast tenderness cough or bone pain..  She had mammograms back in September which I have reviewed were BI-RADS 1 benign.  She is currently on Arimidex tolerating it well without side effect.  COMPLICATIONS OF TREATMENT: none  FOLLOW UP COMPLIANCE: keeps appointments   PHYSICAL EXAM:  BP (!) 147/85 (BP Location: Left Arm, Patient Position: Sitting, Cuff Size: Normal)   Pulse 72   Temp 97.7 F (36.5 C) (Tympanic)   Ht '5\' 3"'$  (1.6 m) Comment: stated ht  Wt 146 lb (66.2 kg)   BMI 25.86 kg/m  Lungs are clear to A&P cardiac examination essentially unremarkable with regular rate and rhythm. No dominant mass or nodularity is noted in either breast in 2 positions examined. Incision is well-healed. No axillary or supraclavicular adenopathy is appreciated. Cosmetic result is excellent.  Well-developed well-nourished patient in NAD. HEENT reveals PERLA, EOMI, discs not visualized.  Oral cavity is clear. No oral mucosal lesions are identified. Neck is clear without evidence of cervical or supraclavicular adenopathy. Lungs are clear to A&P. Cardiac examination is essentially unremarkable with regular rate and rhythm without murmur rub or thrill. Abdomen is benign with no organomegaly or masses noted. Motor sensory and DTR levels are equal and symmetric in the upper and lower extremities. Cranial nerves II  through XII are grossly intact. Proprioception is intact. No peripheral adenopathy or edema is identified. No motor or sensory levels are noted. Crude visual fields are within normal range.  RADIOLOGY RESULTS: Mammograms reviewed compatible with above-stated findings  PLAN: Present time patient is doing well with no evidence of disease now at over 3 years.  She is starting to have early renal failure.  Transportation is difficult for the patient based on this I will turn follow-up care over to her other providers.  I be happy to reevaluate her at any time should that be indicated.  She continues on Arimidex without side effect.  Patient is to call with any concerns.  I would like to take this opportunity to thank you for allowing me to participate in the care of your patient.Noreene Filbert, MD

## 2022-04-19 ENCOUNTER — Other Ambulatory Visit: Payer: Self-pay | Admitting: Family Medicine

## 2022-04-19 DIAGNOSIS — Z760 Encounter for issue of repeat prescription: Secondary | ICD-10-CM

## 2022-04-19 NOTE — Telephone Encounter (Signed)
Pt need refill on busPIRone sent to [facility] [address]

## 2022-04-29 ENCOUNTER — Other Ambulatory Visit: Payer: Self-pay | Admitting: Oncology

## 2022-05-01 ENCOUNTER — Other Ambulatory Visit: Payer: Self-pay

## 2022-05-01 DIAGNOSIS — Z1231 Encounter for screening mammogram for malignant neoplasm of breast: Secondary | ICD-10-CM

## 2022-05-11 ENCOUNTER — Ambulatory Visit (INDEPENDENT_AMBULATORY_CARE_PROVIDER_SITE_OTHER): Payer: PPO | Admitting: Family Medicine

## 2022-05-11 ENCOUNTER — Encounter: Payer: Self-pay | Admitting: Family Medicine

## 2022-05-11 VITALS — BP 130/70 | HR 79 | Temp 98.5°F | Ht 63.0 in | Wt 150.6 lb

## 2022-05-11 DIAGNOSIS — F32A Depression, unspecified: Secondary | ICD-10-CM

## 2022-05-11 DIAGNOSIS — J449 Chronic obstructive pulmonary disease, unspecified: Secondary | ICD-10-CM | POA: Diagnosis not present

## 2022-05-11 DIAGNOSIS — F419 Anxiety disorder, unspecified: Secondary | ICD-10-CM | POA: Diagnosis not present

## 2022-05-11 DIAGNOSIS — R7303 Prediabetes: Secondary | ICD-10-CM | POA: Diagnosis not present

## 2022-05-11 DIAGNOSIS — Z170 Estrogen receptor positive status [ER+]: Secondary | ICD-10-CM | POA: Diagnosis not present

## 2022-05-11 DIAGNOSIS — I10 Essential (primary) hypertension: Secondary | ICD-10-CM

## 2022-05-11 DIAGNOSIS — C50311 Malignant neoplasm of lower-inner quadrant of right female breast: Secondary | ICD-10-CM

## 2022-05-11 DIAGNOSIS — E785 Hyperlipidemia, unspecified: Secondary | ICD-10-CM | POA: Diagnosis not present

## 2022-05-11 NOTE — Assessment & Plan Note (Signed)
Well-controlled.  She will continue HCTZ 12.5 mg daily and lisinopril 40 mg daily.  She has labs next week with hematology.  We will see if they can draw some lab work for [place].

## 2022-05-11 NOTE — Patient Instructions (Signed)
Nice to see you. Will see if we can get lab work through the cancer center. If your breathing worsens or does not respond to the albuterol or Symbicort please let us know.

## 2022-05-11 NOTE — Assessment & Plan Note (Signed)
She will continue to follow with oncology. 

## 2022-05-11 NOTE — Assessment & Plan Note (Signed)
Well-controlled.  She will continue Celexa 20 mg daily and buspirone 5 mg twice daily.

## 2022-05-11 NOTE — Assessment & Plan Note (Signed)
Generally well-controlled.  She can continue Symbicort 2 puffs twice daily.  Discussed using her albuterol for rescue inhaler.  If she has any worsening symptoms she will let us know.

## 2022-05-11 NOTE — Progress Notes (Signed)
Tammy Rumps, MD Phone: [phone]  Tammy Choi is a 76 y.o. female who presents today for f/u.  HYPERTENSION Disease Monitoring Home BP Monitoring similar to today Chest pain- no    Dyspnea- from COPD Medications Compliance-  taking lisinopril, HCTZ.   Edema- no BMET    Component Value Date/Time   NA 141 11/21/2021 1420   K 5.1 11/21/2021 1420   CL 106 11/21/2021 1420   CO2 30 11/21/2021 1420   GLUCOSE 63 (L) 11/21/2021 1420   BUN 40 (H) 11/21/2021 1420   CREATININE 1.28 (H) 11/21/2021 1420   CREATININE 0.91 08/01/2018 1458   CALCIUM 9.3 11/21/2021 1420   GFRNONAA 38 (L) 11/13/2021 1235   GFRAA 41 (L) 05/30/2020 1304   Anxiety/depression: Patient notes she is very good with these things.  She feels so much better.  She is on Celexa and BuSpar.  No SI or HI.  History of breast cancer: She notes radiation oncology released her.  She continues to follow with oncology.  She continues on anastrozole.  COPD: She notes occasional shortness of breath with exertion that responds to using her Symbicort.  She notes no congestion, cough, or wheezing.  She does not use her albuterol often.  Social History   Tobacco Use  Smoking Status Former   Packs/day: 1.00   Years: 35.00   Total pack years: 35.00   Types: Cigarettes   Quit date: 12/20/2015   Years since quitting: 6.3  Smokeless Tobacco Never    Current Outpatient Medications on File Prior to Visit  Medication Sig Dispense Refill   anastrozole (ARIMIDEX) 1 MG tablet Take 1 tablet by mouth once daily 90 tablet 0   budesonide-formoterol (SYMBICORT) 160-4.5 MCG/ACT inhaler Inhale 2 puffs by mouth twice daily 33 g 0   busPIRone (BUSPAR) 5 MG tablet Take 1 tablet by mouth twice daily 60 tablet 0   calcitRIOL (ROCALTROL) 0.25 MCG capsule Take 0.25 mcg by mouth daily.     Cholecalciferol 25 MCG (1000 UT) tablet Take by mouth.     citalopram (CELEXA) 20 MG tablet Take 1 tablet by mouth once daily 90 tablet 1    hydrochlorothiazide (HYDRODIURIL) 12.5 MG tablet Take 1 tablet by mouth once daily 90 tablet 0   lisinopril (ZESTRIL) 40 MG tablet Take 1 tablet (40 mg total) by mouth daily. 90 tablet 1   PROAIR HFA 108 (90 Base) MCG/ACT inhaler INHALE 2 PUFFS BY MOUTH EVERY 6 HOURS AS NEEDED FOR WHEEZING OR SHORTNESS OF BREATH (Patient taking differently: Inhale 1-2 puffs into the lungs as needed for wheezing or shortness of breath.) 9 g 0   rosuvastatin (CRESTOR) 5 MG tablet Take 1 tablet by mouth once daily 90 tablet 3   No current facility-administered medications on file prior to visit.     ROS see history of present illness  Objective  Physical Exam Vitals:   05/11/22 1041  BP: 130/70  Pulse: 79  Temp: 98.5 F (36.9 C)  SpO2: 98%    BP Readings from Last 3 Encounters:  05/11/22 130/70  04/11/22 (!) 147/85  02/06/22 100/60   Wt Readings from Last 3 Encounters:  05/11/22 150 lb 9.6 oz (68.3 kg)  04/11/22 146 lb (66.2 kg)  03/13/22 145 lb (65.8 kg)    Physical Exam Constitutional:      General: She is not in acute distress.    Appearance: She is not diaphoretic.  Cardiovascular:     Rate and Rhythm: Normal rate and regular rhythm.  Heart sounds: Normal heart sounds.  Pulmonary:     Effort: Pulmonary effort is normal.     Breath sounds: Normal breath sounds.  Skin:    General: Skin is warm and dry.  Neurological:     Mental Status: She is alert.      Assessment/Plan: Please see individual problem list.  Problem List Items Addressed This Visit     Anxiety and depression (Chronic)    Well-controlled.  She will continue Celexa 20 mg daily and buspirone 5 mg twice daily.      COPD (chronic obstructive pulmonary disease) (HCC) (Chronic)    Generally well-controlled.  She can continue Symbicort 2 puffs twice daily.  Discussed using her albuterol for rescue inhaler.  If she has any worsening symptoms she will let us know.      Hypertension - Primary (Chronic)     Well-controlled.  She will continue HCTZ 12.5 mg daily and lisinopril 40 mg daily.  She has labs next week with hematology.  We will see if they can draw some lab work for [place].      Hyperlipidemia   Relevant Orders   Lipid panel   Malignant neoplasm of lower-inner quadrant of right breast of female, estrogen receptor positive ([place])    She will continue to follow with oncology.      Prediabetes   Relevant Orders   Hemoglobin A1c    Return in about 6 months (around 11/11/2022).   Tammy Rumps, MD [place]

## 2022-05-13 ENCOUNTER — Other Ambulatory Visit: Payer: Self-pay | Admitting: Family Medicine

## 2022-05-13 DIAGNOSIS — F419 Anxiety disorder, unspecified: Secondary | ICD-10-CM

## 2022-05-14 ENCOUNTER — Inpatient Hospital Stay: Payer: PPO | Admitting: Oncology

## 2022-05-14 ENCOUNTER — Inpatient Hospital Stay: Payer: PPO

## 2022-05-14 ENCOUNTER — Inpatient Hospital Stay: Payer: PPO | Attending: Oncology

## 2022-05-14 ENCOUNTER — Encounter: Payer: Self-pay | Admitting: Oncology

## 2022-05-14 VITALS — BP 137/79 | HR 76 | Temp 98.1°F | Wt 151.0 lb

## 2022-05-14 DIAGNOSIS — M810 Age-related osteoporosis without current pathological fracture: Secondary | ICD-10-CM

## 2022-05-14 DIAGNOSIS — Z79899 Other long term (current) drug therapy: Secondary | ICD-10-CM | POA: Insufficient documentation

## 2022-05-14 DIAGNOSIS — Z170 Estrogen receptor positive status [ER+]: Secondary | ICD-10-CM | POA: Insufficient documentation

## 2022-05-14 DIAGNOSIS — C50311 Malignant neoplasm of lower-inner quadrant of right female breast: Secondary | ICD-10-CM | POA: Insufficient documentation

## 2022-05-14 DIAGNOSIS — N189 Chronic kidney disease, unspecified: Secondary | ICD-10-CM

## 2022-05-14 DIAGNOSIS — Z79811 Long term (current) use of aromatase inhibitors: Secondary | ICD-10-CM

## 2022-05-14 LAB — COMPREHENSIVE METABOLIC PANEL
ALT: 17 U/L (ref 0–44)
AST: 20 U/L (ref 15–41)
Albumin: 4.1 g/dL (ref 3.5–5.0)
Alkaline Phosphatase: 46 U/L (ref 38–126)
Anion gap: 11 (ref 5–15)
BUN: 34 mg/dL — ABNORMAL HIGH (ref 8–23)
CO2: 26 mmol/L (ref 22–32)
Calcium: 9 mg/dL (ref 8.9–10.3)
Chloride: 101 mmol/L (ref 98–111)
Creatinine, Ser: 1.33 mg/dL — ABNORMAL HIGH (ref 0.44–1.00)
GFR, Estimated: 41 mL/min — ABNORMAL LOW (ref >60)
Glucose, Bld: 104 mg/dL — ABNORMAL HIGH (ref 70–99)
Potassium: 5.2 mmol/L — ABNORMAL HIGH (ref 3.5–5.1)
Sodium: 138 mmol/L (ref 135–145)
Total Bilirubin: 0.4 mg/dL (ref 0.3–1.2)
Total Protein: 7.6 g/dL (ref 6.5–8.1)

## 2022-05-14 LAB — CBC
HCT: 40.6 % (ref 36.0–46.0)
Hemoglobin: 12.7 g/dL (ref 12.0–15.0)
MCH: 28.5 pg (ref 26.0–34.0)
MCHC: 31.3 g/dL (ref 30.0–36.0)
MCV: 91.2 fL (ref 80.0–100.0)
Platelets: 217 10*3/uL (ref 150–400)
RBC: 4.45 MIL/uL (ref 3.87–5.11)
RDW: 13.8 % (ref 11.5–15.5)
WBC: 7.5 10*3/uL (ref 4.0–10.5)
nRBC: 0 % (ref 0.0–0.2)

## 2022-05-14 MED ORDER — DENOSUMAB 60 MG/ML ~~LOC~~ SOSY
60.0000 mg | PREFILLED_SYRINGE | Freq: Once | SUBCUTANEOUS | Status: AC
  Administered 2022-05-14: 60 mg via SUBCUTANEOUS
  Filled 2022-05-14: qty 1

## 2022-05-14 NOTE — Progress Notes (Signed)
Hematology/Oncology Progress note [phone] [phone]      Patient Care Team: Leone Haven, MD as PCP - General (Family Medicine) Bary Castilla, [name], MD (General Surgery) Earlie Server, MD as Consulting Physician (Oncology)  REFERRING PROVIDER: Dr. Bary Castilla REASON FOR VISIT:  Follow up for breast cancer  HISTORY OF PRESENTING ILLNESS:  Tammy Choi is a  76 y.o.  female with PMH listed below who was referred to me for evaluation of newly diagnosed breast cancer. Patient had mammogram 05/01/2018 which showed indeterminate microcalcification in the medial right breast measuring 0.9 x 0.7 x 0.7 cm Biopsy pathology showed: Invasive mammary carcinoma, DCIS, calcification associated with DCIS, grade 2, ER positive PR negative, HER-2 negative (IHC 1+)  Nipple discharge: Denies Family history: Sister passed away from breast cancer History of radiation to chest: denies.  Previous breast surgery: Left breast biopsy with benign etiology. . 06/16/2018, right breast lumpectomy and sentinel lymph node biopsy showed residual invasive mammary carcinoma, residual ductal carcinoma in situ, biopsy site changes and metallic clip.  Incidental 5 mm fibroadenoma.  All 3 sentinel lymph node negative. ER positive HER2 negative.  Patient underwent adjuvant radiation finished in January 2020. Started aromatase inhibitor with Arimidex 1 mg daily since 1/20/ 2020.  # she followed up with urology Dr. Diamantina Providence for bilateral hydronephrosis. Patient was recommended to have CT urogram done for evaluation of filling defects with monitoring kidney function versus more invasive investigation with cystoscopy, bilateral retrograde pyelograms and possible ureteroscopy possible biopsy.she choose to have a image surveillance with monitoring of kidney function.  # Arimidex  Since Jan 2020.  #Chronic bilateral hydronephrosis, she has had work up with Urology and no obstruction has been found.   INTERVAL HISTORY Tammy Choi is a 76 y.o. female who has above history reviewed by me today presents for follow up visit for management of Stage IA right breast cancer, ER + HER 2 - She has no new complaints.  Patient takes Arimidex and tolerates well.   Review of Systems  Constitutional:  Positive for malaise/fatigue. Negative for chills, fever and weight loss.  HENT:  Negative for nosebleeds and sore throat.   Eyes:  Negative for double vision, photophobia and redness.  Respiratory:  Negative for cough, shortness of breath and wheezing.   Cardiovascular:  Negative for chest pain, palpitations, orthopnea and leg swelling.  Gastrointestinal:  Negative for abdominal pain, blood in stool, nausea and vomiting.  Genitourinary:  Negative for dysuria.  Musculoskeletal:  Negative for back pain, myalgias and neck pain.       Right hip replacement.   Skin:  Negative for itching and rash.  Neurological:  Negative for dizziness, tingling and tremors.  Endo/Heme/Allergies:  Negative for environmental allergies. Does not bruise/bleed easily.  Psychiatric/Behavioral:  Negative for depression and hallucinations. The patient is not nervous/anxious.     MEDICAL HISTORY:  Past Medical History:  Diagnosis Date   Arthritis    Breast cancer ([place]) 05/07/2018   T1a, N0; ER/ PR positive, Her 2 neu not overexpressed.    COPD (chronic obstructive pulmonary disease) ([place])    Not on home o2   Depression    HTN (hypertension)    Hyperlipemia    Osteoporosis 07/28/2020   Personal history of radiation therapy     SURGICAL HISTORY: Past Surgical History:  Procedure Laterality Date   ABDOMINAL HYSTERECTOMY     BREAST BIOPSY Left 04/30/2013   neg core   BREAST BIOPSY Right 05/07/2018   right breast  stereo x clip INVASIVE MAMMARY CARCINOMA   BREAST LUMPECTOMY Right 2019   BREAST LUMPECTOMY WITH SENTINEL LYMPH NODE BIOPSY Right 06/16/2018   Procedure: BREAST LUMPECTOMY WITH SENTINEL LYMPH NODE BX;   Surgeon: Robert Bellow, MD;  Location: [facility] ORS;  Service: General;  Laterality: Right;   COLONOSCOPY  2014   Dr Bary Castilla   Left Leg Surgery     TONSILLECTOMY     TOTAL HIP ARTHROPLASTY Right 03/29/2020   Procedure: [place];  Surgeon: Corky Mull, MD;  Location: [facility] ORS;  Service: Orthopedics;  Laterality: Right;    SOCIAL HISTORY: Social History   Socioeconomic History   Marital status: Divorced    Spouse name: Not on file   Number of children: Not on file   Years of education: Not on file   Highest education level: Not on file  Occupational History   Occupation: Retired Database administrator: retired  Tobacco Use   Smoking status: Former    Packs/day: 1.00    Years: 35.00    Total pack years: 35.00    Types: Cigarettes    Quit date: 12/20/2015    Years since quitting: 6.4   Smokeless tobacco: Never  Vaping Use   Vaping Use: Never used  Substance and Sexual Activity   Alcohol use: No    Alcohol/week: 0.0 standard drinks of alcohol   Drug use: No   Sexual activity: Not on file  Other Topics Concern   Not on file  Social History Narrative   Lives with daughter at home. Independent at baseline.   Social Determinants of Health   Financial Resource Strain: Low Risk  (03/13/2022)   Overall Financial Resource Strain (CARDIA)    Difficulty of Paying Living Expenses: Not hard at all  Food Insecurity: No Food Insecurity (03/13/2022)   Hunger Vital Sign    Worried About Running Out of Food in the Last Year: Never true    Ran Out of Food in the Last Year: Never true  Transportation Needs: No Transportation Needs (03/13/2022)   PRAPARE - Hydrologist (Medical): No    Lack of Transportation (Non-Medical): No  Physical Activity: Not on file  Stress: No Stress Concern Present (03/13/2022)   [place]    Feeling of Stress : Not at all  Social Connections:  Unknown (03/13/2022)   Social Connection and Isolation Panel [NHANES]    Frequency of Communication with Friends and Family: Not on file    Frequency of Social Gatherings with Friends and Family: More than three times a week    Attends Religious Services: Not on file    Active Member of Clubs or Organizations: Not on file    Attends Archivist Meetings: Not on file    Marital Status: Not on file  Intimate Partner Violence: Not At Risk (03/13/2022)   Humiliation, Afraid, Rape, and Kick questionnaire    Fear of Current or Ex-Partner: No    Emotionally Abused: No    Physically Abused: No    Sexually Abused: No    FAMILY HISTORY: Family History  Problem Relation Age of Onset   Kidney disease Mother        deceased 60   Heart disease Father        deceased 30   Breast cancer Other 59       maternal half-sister; deceased 1   Colon cancer Neg Hx  ALLERGIES:  is allergic to penicillins.  MEDICATIONS:  Current Outpatient Medications  Medication Sig Dispense Refill   anastrozole (ARIMIDEX) 1 MG tablet Take 1 tablet by mouth once daily 90 tablet 0   budesonide-formoterol (SYMBICORT) 160-4.5 MCG/ACT inhaler Inhale 2 puffs by mouth twice daily 33 g 0   busPIRone (BUSPAR) 5 MG tablet Take 1 tablet by mouth twice daily 60 tablet 0   calcitRIOL (ROCALTROL) 0.25 MCG capsule Take 0.25 mcg by mouth daily.     Cholecalciferol 25 MCG (1000 UT) tablet Take by mouth.     citalopram (CELEXA) 20 MG tablet Take 1 tablet by mouth once daily 90 tablet 1   lisinopril (ZESTRIL) 40 MG tablet Take 1 tablet (40 mg total) by mouth daily. 90 tablet 1   PROAIR HFA 108 (90 Base) MCG/ACT inhaler INHALE 2 PUFFS BY MOUTH EVERY 6 HOURS AS NEEDED FOR WHEEZING OR SHORTNESS OF BREATH (Patient taking differently: Inhale 1-2 puffs into the lungs as needed for wheezing or shortness of breath.) 9 g 0   rosuvastatin (CRESTOR) 5 MG tablet Take 1 tablet by mouth once daily 90 tablet 3   hydrochlorothiazide  (HYDRODIURIL) 12.5 MG tablet Take 1 tablet by mouth once daily (Patient not taking: Reported on 05/14/2022) 90 tablet 0   No current facility-administered medications for this visit.     PHYSICAL EXAMINATION: ECOG PERFORMANCE STATUS: 0 - Asymptomatic Vitals:   05/14/22 1305  BP: 137/79  Pulse: 76  Temp: 98.1 F (36.7 C)   Filed Weights   05/14/22 1305  Weight: 151 lb (68.5 kg)    Physical Exam Constitutional:      General: She is not in acute distress. HENT:     Head: Normocephalic and atraumatic.  Eyes:     General: No scleral icterus.    Pupils: Pupils are equal, round, and reactive to light.  Cardiovascular:     Rate and Rhythm: Normal rate and regular rhythm.     Heart sounds: Normal heart sounds.  Pulmonary:     Effort: Pulmonary effort is normal. No respiratory distress.     Breath sounds: No wheezing.  Abdominal:     General: Bowel sounds are normal. There is no distension.     Palpations: Abdomen is soft. There is no mass.     Tenderness: There is no abdominal tenderness.  Musculoskeletal:        General: No deformity. Normal range of motion.     Cervical back: Normal range of motion and neck supple.  Skin:    General: Skin is warm and dry.     Findings: No erythema or rash.  Neurological:     Mental Status: She is alert and oriented to person, place, and time.     Cranial Nerves: No cranial nerve deficit.     Coordination: Coordination normal.  Psychiatric:        Behavior: Behavior normal.        Thought Content: Thought content normal.   Breast exam was performed in seated and lying down position. Patient is status post right breast lumpectomy with a well-healed surgical scar, chronic tissue thickening around her scar about quarter size. Marland Kitchen  No palpable mass in left breast.  No palpable axillary lymphadenopathy.      LABORATORY DATA:  I have reviewed the data as listed Lab Results  Component Value Date   WBC 7.5 05/14/2022   HGB 12.7 05/14/2022    HCT 40.6 05/14/2022   MCV 91.2 05/14/2022   PLT  217 05/14/2022   Recent Labs    11/13/21 1235 11/21/21 1420 05/14/22 1239  NA 137 141 138  K 5.4* 5.1 5.2*  CL 102 106 101  CO2 _0 GLUCOSE 106* 63* 104*  BUN 43* 40* 34*  CREATININE 1.43* 1.28* 1.33*  CALCIUM 9.2 9.3 9.0  GFRNONAA 38*  --  41*  PROT  --   --  7.6  ALBUMIN  --   --  4.1  AST  --   --  20  ALT  --   --  17  ALKPHOS  --   --  46  BILITOT  --   --  0.4    Iron/TIBC/Ferritin/ %Sat No results found for: "IRON", "TIBC", "FERRITIN", "IRONPCTSAT"   RADIOGRAPHIC STUDIES: I have personally reviewed the radiological images as listed and agreed with the findings in the report.   ASSESSMENT & PLAN:  1. Malignant neoplasm of lower-inner quadrant of right breast of female, estrogen receptor positive ([place])   2. Osteoporosis, unspecified osteoporosis type, unspecified pathological fracture presence   3. Aromatase inhibitor use    Cancer Staging  Malignant neoplasm of lower-inner quadrant of right breast of female, estrogen receptor positive ([place]) Staging form: Breast, AJCC 8th Edition - Pathologic stage from 09/29/2018: Stage IA (pT1a, pN0, cM0, G2, ER+, PR-, HER2-) - Signed by Earlie Server, MD on 09/29/2018  Stage IA breast cancer ER positive, PR negative, HER-2 negative. Clinically she is doing well.  Labs are reviewed and discussed with patient. Continue Arimidex. annual mammogram, due in Sept 2023, she get mammogram ordered through surgeon's office.   Osteoporosis, she has full mouth dentures.    Continue calcium and vitamin D supplementation.  Proceed with Prolia today. Repeat DEXA in Sept 2023  # Weight loss, weight has improved.  Patient drinks nutritional supplements.   All questions were answered. The patient knows to call the clinic with any problems questions or concerns. Return of visit:   Lab -MD and Prolia in 6 months.     Earlie Server, MD, PhD Hematology Oncology  05/14/2022

## 2022-06-11 ENCOUNTER — Ambulatory Visit
Admission: RE | Admit: 2022-06-11 | Discharge: 2022-06-11 | Disposition: A | Discharge status: Home / Self Care | Payer: PPO | Source: Ambulatory Visit | Attending: Surgery | Admitting: Surgery

## 2022-06-11 DIAGNOSIS — Z1231 Encounter for screening mammogram for malignant neoplasm of breast: Secondary | ICD-10-CM | POA: Diagnosis not present

## 2022-06-13 ENCOUNTER — Encounter: Payer: Self-pay | Admitting: Oncology

## 2022-06-14 ENCOUNTER — Other Ambulatory Visit: Payer: Self-pay | Admitting: Family Medicine

## 2022-06-14 DIAGNOSIS — F411 Generalized anxiety disorder: Secondary | ICD-10-CM

## 2022-06-15 ENCOUNTER — Other Ambulatory Visit: Payer: PPO

## 2022-06-19 ENCOUNTER — Telehealth: Payer: Self-pay | Admitting: *Deleted

## 2022-06-19 NOTE — Patient Outreach (Signed)
  Care Coordination   06/19/2022 Name: Tammy Choi MRN: [id number] DOB: 09/09/1946   Care Coordination Outreach Attempts:  An unsuccessful telephone outreach was attempted today to offer the patient information about available care coordination services as a benefit of their health plan.   Follow Up Plan:  Additional outreach attempts will be made to offer the patient care coordination information and services.   Encounter Outcome:  No Answer  Care Coordination Interventions Activated:  Yes   Care Coordination Interventions:  No, not indicated    [facility] [phone]

## 2022-06-20 ENCOUNTER — Encounter: Payer: Self-pay | Admitting: Surgery

## 2022-06-20 ENCOUNTER — Other Ambulatory Visit: Payer: Self-pay

## 2022-06-20 ENCOUNTER — Ambulatory Visit: Payer: PPO | Admitting: Surgery

## 2022-06-20 VITALS — BP 148/82 | HR 69 | Temp 98.4°F | Ht 63.0 in | Wt 150.0 lb

## 2022-06-20 DIAGNOSIS — C50311 Malignant neoplasm of lower-inner quadrant of right female breast: Secondary | ICD-10-CM

## 2022-06-20 DIAGNOSIS — Z853 Personal history of malignant neoplasm of breast: Secondary | ICD-10-CM | POA: Diagnosis not present

## 2022-06-20 DIAGNOSIS — R2232 Localized swelling, mass and lump, left upper limb: Secondary | ICD-10-CM

## 2022-06-20 NOTE — Patient Instructions (Addendum)
Patient will be asked to return to the office in one year with a bilateral screening mammogram.  We will send you a letter about these appointments.   Continue self breast exams. Call office for any new breast issues or concerns.   Breast Self-Awareness Breast self-awareness is knowing how your breasts look and feel. You need to: Check your breasts on a regular basis. Tell your doctor about any changes. Become familiar with the look and feel of your breasts. This can help you catch a breast problem while it is still small and can be treated. You should do breast self-exams even if you have breast implants. What you need: A mirror. A well-lit room. A pillow or other soft object. How to do a breast self-exam Follow these steps to do a breast self-exam: Look for changes  Take off all the clothes above your waist. Stand in front of a mirror in a room with good lighting. Put your hands down at your sides. Compare your breasts in the mirror. Look for any difference between them, such as: A difference in shape. A difference in size. Wrinkles, dips, and bumps in one breast and not the other. Look at each breast for changes in the skin, such as: Redness. Scaly areas. Skin that has gotten thicker. Dimpling. Open sores (ulcers). Look for changes in your nipples, such as: Fluid coming out of a nipple. Fluid around a nipple. Bleeding. Dimpling. Redness. A nipple that looks pushed in (retracted), or that has changed position. Feel for changes Lie on your back. Feel each breast. To do this: Pick a breast to feel. Place a pillow under the shoulder closest to that breast. Put the arm closest to that breast behind your head. Feel the nipple area of that breast using the hand of your other arm. Feel the area with the pads of your three middle fingers by making small circles with your fingers. Use light, medium, and firm pressure. Continue the overlapping circles, moving downward over the  breast. Keep making circles with your fingers. Stop when you feel your ribs. Start making circles with your fingers again, this time going upward until you reach your collarbone. Then, make circles outward across your breast and into your armpit area. Squeeze your nipple. Check for discharge and lumps. Repeat these steps to check your other breast. Sit or stand in the tub or shower. With soapy water on your skin, feel each breast the same way you did when you were lying down. Write down what you find Writing down what you find can help you remember what to tell your doctor. Write down: What is normal for each breast. Any changes you find in each breast. These include: The kind of changes you find. A tender or painful breast. Any lump you find. Write down its size and where it is. When you last had your monthly period (menstrual cycle). General tips If you are breastfeeding, the best time to check your breasts is after you feed your baby or after you use a breast pump. If you get monthly bleeding, the best time to check your breasts is 5-7 days after your monthly cycle ends. With time, you will become comfortable with the self-exam. You will also start to know if there are changes in your breasts. Contact a doctor if: You see a change in the shape or size of your breasts or nipples. You see a change in the skin of your breast or nipples, such as red or scaly skin. You  have fluid coming from your nipples that is not normal. You find a new lump or thick area. You have breast pain. You have any concerns about your breast health. Summary Breast self-awareness includes looking for changes in your breasts and feeling for changes within your breasts. You should do breast self-awareness in front of a mirror in a well-lit room. If you get monthly periods (menstrual cycles), the best time to check your breasts is 5-7 days after your period ends. Tell your doctor about any changes you see in your  breasts. Changes include changes in size, changes on the skin, painful or tender breasts, or fluid from your nipples that is not normal. This information is not intended to replace advice given to you by your health care provider. Make sure you discuss any questions you have with your health care provider. Document Revised: 07/20/2021 Document Reviewed: 07/20/2021 Elsevier Patient Education  [employer].

## 2022-06-21 ENCOUNTER — Other Ambulatory Visit: Payer: Self-pay | Admitting: Family

## 2022-06-21 DIAGNOSIS — F32A Depression, unspecified: Secondary | ICD-10-CM

## 2022-06-24 ENCOUNTER — Encounter: Payer: Self-pay | Admitting: Surgery

## 2022-06-24 NOTE — Progress Notes (Signed)
Outpatient Surgical Follow Up  06/24/2022  Tammy Choi is an 76 y.o. female.   Chief Complaint  Patient presents with   Follow-up    9 mo mammo/ breast exam    HPI: Tammy Choi is a 76 year old female with a history of right breast cancer Invasive mammary ER + PR - HER Neg status post lumpectomy and radiation therapy in 2019 by Dr. Bary Castilla. underwent XRT.  She denies any breast lumps.  No breast discharge.  No fevers no chills no weight loss.  she did have a mammo that I have personally reviewed showing no evidence of concerning lesions She is Arimidex She also has a new subcutaneous nodule on the left breast that has been present for several months and wishes to have this removed  Past Medical History:  Diagnosis Date   Arthritis    Breast cancer ([place]) 05/07/2018   T1a, N0; ER/ PR positive, Her 2 neu not overexpressed.    COPD (chronic obstructive pulmonary disease) ([place])    Not on home o2   Depression    HTN (hypertension)    Hyperlipemia    Osteoporosis 07/28/2020   Personal history of radiation therapy     Past Surgical History:  Procedure Laterality Date   ABDOMINAL HYSTERECTOMY     BREAST BIOPSY Left 04/30/2013   neg core   BREAST BIOPSY Right 05/07/2018   right breast stereo x clip INVASIVE MAMMARY CARCINOMA   BREAST LUMPECTOMY Right 2019   BREAST LUMPECTOMY WITH SENTINEL LYMPH NODE BIOPSY Right 06/16/2018   Procedure: BREAST LUMPECTOMY WITH SENTINEL LYMPH NODE BX;  Surgeon: Robert Bellow, MD;  Location: [facility] ORS;  Service: General;  Laterality: Right;   COLONOSCOPY  2014   Dr Bary Castilla   Left Leg Surgery     TONSILLECTOMY     TOTAL HIP ARTHROPLASTY Right 03/29/2020   Procedure: TOTAL HIP ARTHROPLASTY;  Surgeon: Corky Mull, MD;  Location: [facility] ORS;  Service: Orthopedics;  Laterality: Right;    Family History  Problem Relation Age of Onset   Kidney disease Mother        deceased 43   Heart disease Father        deceased 18   Breast cancer Other 58        maternal half-sister; deceased 22   Colon cancer Neg Hx     Social History:  reports that she quit smoking about 6 years ago. Her smoking use included cigarettes. She has a 35.00 pack-year smoking history. She has never used smokeless tobacco. She reports that she does not drink alcohol and does not use drugs.  Allergies:  Allergies  Allergen Reactions   Penicillins Anaphylaxis and Other (See Comments)    Has patient had a PCN reaction causing immediate rash, facial/tongue/throat swelling, SOB or lightheadedness with hypotension: Yes Has patient had a PCN reaction causing severe rash involving mucus membranes or skin necrosis: No Has patient had a PCN reaction that required hospitalization No Has patient had a PCN reaction occurring within the last 10 years: No If all of the above answers are "NO", then may proceed with Cephalosporin use.    Medications reviewed.    ROS Full ROS performed and is otherwise negative other than what is stated in HPI   BP (!) 148/82   Pulse 69   Temp 98.4 F (36.9 C) (Oral)   Ht '5\' 3"'$  (1.6 m)   Wt 150 lb (68 kg)   SpO2 94%   BMI 26.57 kg/m  Physical Exam Physical Exam Vitals and nursing note reviewed. Exam conducted with a chaperone present.  Constitutional:      General: She is not in acute distress.    Appearance: Normal appearance. She is normal weight.  Eyes:     General: No scleral icterus.       Right eye: No discharge.        Left eye: No discharge.  Neck:     Vascular: No carotid bruit.  Cardiovascular:     Rate and Rhythm: Normal rate and regular rhythm.  Pulmonary:     Effort: Pulmonary effort is normal. No respiratory distress.     Breath sounds: Normal breath sounds. No stridor.     Comments: BREAST: Right lumpectomy scar and sentinel lymph node biopsy scar.  There is no evidence of new masses there is no evidence of skin ulcerations.  There are no evidence of axillary lymphadenopathy.   Abdominal:     General:  Abdomen is flat. There is no distension.     Palpations: Abdomen is soft. There is no mass.     Tenderness: There is no abdominal tenderness. There is no right CVA tenderness, guarding or rebound.     Hernia: No hernia is present.  Musculoskeletal:     Cervical back: Normal range of motion and neck supple. No rigidity or tenderness.  Lymphadenopathy:     Cervical: No cervical adenopathy.  Skin:    there Is evidence of a 12 mm nodule on the left wrist.  Mobile neurological:     General: No focal deficit present.     Mental Status: She is alert and oriented to person, place, and time.  Psychiatric:        Mood and Affect: Mood normal.        Behavior: Behavior normal.        Thought Content: Thought content normal.        Judgment: Judgment normal.     Assessment/Plan: 76 year old female history of Invasive breast cancer without evidence of recurrence.  No need for further test or biopsies at this time.  We will continue yearly f/u per her wishes. She also wants to have the left wrist nodule excised and I do think that we can do it under local anesthetic in a few weeks. Please note that I have spent greater than  30 minutes  in this visit including personally reviewing imaging studies, counseling/coordination of care and performing appropriate documentation   Caroleen Hamman, MD [facility]

## 2022-06-25 ENCOUNTER — Telehealth: Payer: Self-pay

## 2022-06-25 NOTE — Patient Outreach (Signed)
  Care Coordination   06/25/2022 Name: Tammy Choi MRN: [id number] DOB: 01/11/1946   Care Coordination Outreach Attempts:  A second unsuccessful outreach was attempted today to offer the patient with information about available care coordination services as a benefit of their health plan.     Follow Up Plan:  Additional outreach attempts will be made to offer the patient care coordination information and services.   Encounter Outcome:  No Answer  Care Coordination Interventions Activated:  No   Care Coordination Interventions:  No, not indicated    Quinn Plowman RN,BSN,CCM [place] [phone] direct line

## 2022-06-28 ENCOUNTER — Telehealth: Payer: Self-pay

## 2022-06-28 NOTE — Telephone Encounter (Signed)
Patient states her pharmacy is trying to refill her busPIRone (BUSPAR) 5 MG tablet, and they need an approval from Dr. [name].  *Patient states her preferred pharmacy is [facility] on [address].

## 2022-06-29 ENCOUNTER — Other Ambulatory Visit: Payer: Self-pay | Admitting: Family

## 2022-06-29 DIAGNOSIS — F419 Anxiety disorder, unspecified: Secondary | ICD-10-CM

## 2022-06-29 MED ORDER — BUSPIRONE HCL 5 MG PO TABS: 5.0000 mg | ORAL_TABLET | Freq: Two times a day (BID) | ORAL | 3 refills | Status: DC

## 2022-07-03 ENCOUNTER — Other Ambulatory Visit: Payer: PPO

## 2022-07-11 ENCOUNTER — Encounter: Payer: Self-pay | Admitting: Surgery

## 2022-07-11 ENCOUNTER — Other Ambulatory Visit: Payer: Self-pay | Admitting: Surgery

## 2022-07-11 ENCOUNTER — Other Ambulatory Visit: Payer: Self-pay

## 2022-07-11 ENCOUNTER — Ambulatory Visit: Payer: PPO | Admitting: Surgery

## 2022-07-11 VITALS — BP 135/81 | HR 85 | Temp 98.5°F | Ht 63.0 in | Wt 154.0 lb

## 2022-07-11 DIAGNOSIS — L720 Epidermal cyst: Secondary | ICD-10-CM | POA: Diagnosis not present

## 2022-07-11 DIAGNOSIS — R2232 Localized swelling, mass and lump, left upper limb: Secondary | ICD-10-CM | POA: Diagnosis not present

## 2022-07-11 NOTE — Patient Instructions (Signed)
Please call with any questions or concerns. Please wait 48 hours before getting it wet. You may shower as usual on Friday.

## 2022-07-15 ENCOUNTER — Encounter: Payer: Self-pay | Admitting: Surgery

## 2022-07-15 NOTE — Progress Notes (Signed)
DIAGNOSIS Symptomatic soft tissue nodule Left Hand  PROCEDURES 1.  Excision of EIC Left wrist 12 mm 2.  Intermediate closure measuring 62m  ANESTHESIA: Lidocaine 1% with epinephrine  EBL: Minimal  FINDINGS: EIC  After informed consent was obtained the patient was prepped and draped in the usual sterile fashion.  Lidocaine 1% was injected over the area of interest.  15 blade knife used to create an incision and the subcutaneous tissue was dissected free with hemostats.  The nodule was dissected free from adjacent structures using Metzenbaum scissors.  It was sent for permanent pathology.  Hemostasis obtained with pressure.  The wound was closed in a 2 layer fashion with dermal layer using interrupted 3-0 Vicryl.  The skin was closed in a subcuticular fashion using 4-0 Monocryl.  Dermabond was applied.  No complications. The  patient tolerated procedure well

## 2022-07-19 ENCOUNTER — Encounter: Payer: PPO | Admitting: Physician Assistant

## 2022-07-20 ENCOUNTER — Other Ambulatory Visit: Payer: Self-pay | Admitting: Oncology

## 2022-07-25 ENCOUNTER — Other Ambulatory Visit: Payer: PPO

## 2022-07-25 DIAGNOSIS — D631 Anemia in chronic kidney disease: Secondary | ICD-10-CM | POA: Diagnosis not present

## 2022-07-25 DIAGNOSIS — I10 Essential (primary) hypertension: Secondary | ICD-10-CM | POA: Diagnosis not present

## 2022-07-25 DIAGNOSIS — N1831 Chronic kidney disease, stage 3a: Secondary | ICD-10-CM | POA: Diagnosis not present

## 2022-07-25 DIAGNOSIS — R809 Proteinuria, unspecified: Secondary | ICD-10-CM | POA: Diagnosis not present

## 2022-07-25 DIAGNOSIS — N281 Cyst of kidney, acquired: Secondary | ICD-10-CM | POA: Diagnosis not present

## 2022-07-25 DIAGNOSIS — N1330 Unspecified hydronephrosis: Secondary | ICD-10-CM | POA: Diagnosis not present

## 2022-07-26 ENCOUNTER — Other Ambulatory Visit: Payer: Self-pay | Admitting: Nephrology

## 2022-07-26 DIAGNOSIS — N1330 Unspecified hydronephrosis: Secondary | ICD-10-CM

## 2022-07-26 DIAGNOSIS — N281 Cyst of kidney, acquired: Secondary | ICD-10-CM

## 2022-07-26 DIAGNOSIS — R809 Proteinuria, unspecified: Secondary | ICD-10-CM

## 2022-07-26 DIAGNOSIS — D631 Anemia in chronic kidney disease: Secondary | ICD-10-CM

## 2022-07-26 DIAGNOSIS — N1831 Chronic kidney disease, stage 3a: Secondary | ICD-10-CM

## 2022-07-30 ENCOUNTER — Other Ambulatory Visit: Payer: Self-pay | Admitting: Family Medicine

## 2022-07-30 DIAGNOSIS — I10 Essential (primary) hypertension: Secondary | ICD-10-CM

## 2022-08-03 ENCOUNTER — Ambulatory Visit
Admission: RE | Admit: 2022-08-03 | Discharge: 2022-08-03 | Disposition: A | Discharge status: Home / Self Care | Payer: PPO | Source: Ambulatory Visit | Attending: Nephrology | Admitting: Nephrology

## 2022-08-03 DIAGNOSIS — N189 Chronic kidney disease, unspecified: Secondary | ICD-10-CM | POA: Insufficient documentation

## 2022-08-03 DIAGNOSIS — N1330 Unspecified hydronephrosis: Secondary | ICD-10-CM | POA: Diagnosis not present

## 2022-08-03 DIAGNOSIS — N281 Cyst of kidney, acquired: Secondary | ICD-10-CM | POA: Insufficient documentation

## 2022-08-03 DIAGNOSIS — D631 Anemia in chronic kidney disease: Secondary | ICD-10-CM | POA: Diagnosis not present

## 2022-08-03 DIAGNOSIS — R809 Proteinuria, unspecified: Secondary | ICD-10-CM | POA: Insufficient documentation

## 2022-08-03 DIAGNOSIS — N1831 Chronic kidney disease, stage 3a: Secondary | ICD-10-CM | POA: Diagnosis not present

## 2022-08-20 ENCOUNTER — Other Ambulatory Visit: Payer: Self-pay | Admitting: Family Medicine

## 2022-08-29 ENCOUNTER — Ambulatory Visit
Admission: RE | Admit: 2022-08-29 | Discharge: 2022-08-29 | Disposition: A | Discharge status: Home / Self Care | Payer: PPO | Source: Ambulatory Visit | Attending: Oncology | Admitting: Oncology

## 2022-08-29 DIAGNOSIS — Z170 Estrogen receptor positive status [ER+]: Secondary | ICD-10-CM

## 2022-08-29 DIAGNOSIS — Z780 Asymptomatic menopausal state: Secondary | ICD-10-CM | POA: Diagnosis not present

## 2022-08-29 DIAGNOSIS — Z13820 Encounter for screening for osteoporosis: Secondary | ICD-10-CM | POA: Diagnosis not present

## 2022-08-29 DIAGNOSIS — Z923 Personal history of irradiation: Secondary | ICD-10-CM | POA: Insufficient documentation

## 2022-08-29 DIAGNOSIS — Z853 Personal history of malignant neoplasm of breast: Secondary | ICD-10-CM | POA: Diagnosis not present

## 2022-08-29 DIAGNOSIS — M810 Age-related osteoporosis without current pathological fracture: Secondary | ICD-10-CM | POA: Diagnosis not present

## 2022-09-04 ENCOUNTER — Ambulatory Visit: Payer: PPO

## 2022-09-04 ENCOUNTER — Ambulatory Visit (INDEPENDENT_AMBULATORY_CARE_PROVIDER_SITE_OTHER): Payer: PPO

## 2022-09-04 DIAGNOSIS — Z23 Encounter for immunization: Secondary | ICD-10-CM | POA: Diagnosis not present

## 2022-09-17 DIAGNOSIS — N1831 Chronic kidney disease, stage 3a: Secondary | ICD-10-CM | POA: Diagnosis not present

## 2022-09-17 DIAGNOSIS — N1330 Unspecified hydronephrosis: Secondary | ICD-10-CM | POA: Diagnosis not present

## 2022-09-17 DIAGNOSIS — N281 Cyst of kidney, acquired: Secondary | ICD-10-CM | POA: Diagnosis not present

## 2022-09-17 DIAGNOSIS — R809 Proteinuria, unspecified: Secondary | ICD-10-CM | POA: Diagnosis not present

## 2022-09-17 DIAGNOSIS — D631 Anemia in chronic kidney disease: Secondary | ICD-10-CM | POA: Diagnosis not present

## 2022-09-17 DIAGNOSIS — I10 Essential (primary) hypertension: Secondary | ICD-10-CM | POA: Diagnosis not present

## 2022-09-27 ENCOUNTER — Telehealth: Payer: Self-pay

## 2022-09-27 NOTE — Patient Outreach (Signed)
  Care Coordination   09/27/2022 Name: Tammy Choi MRN: [id number] DOB: [birth year]   Care Coordination Outreach Attempts:  An unsuccessful telephone outreach was attempted today to offer the patient information about available care coordination services as a benefit of their health plan.  Unable to reach patient. HIPAA compliant voice message left.   Follow Up Plan:  Additional outreach attempts will be made to offer the patient care coordination information and services.   Encounter Outcome:  No Answer   Care Coordination Interventions:  No, not indicated    Quinn Plowman [facility] [place] [phone] direct line

## 2022-10-21 ENCOUNTER — Other Ambulatory Visit: Payer: Self-pay | Admitting: Oncology

## 2022-11-02 ENCOUNTER — Other Ambulatory Visit: Payer: Self-pay

## 2022-11-02 ENCOUNTER — Other Ambulatory Visit: Payer: Self-pay | Admitting: Family Medicine

## 2022-11-02 DIAGNOSIS — I10 Essential (primary) hypertension: Secondary | ICD-10-CM

## 2022-11-02 MED ORDER — LISINOPRIL 40 MG PO TABS: 40.0000 mg | ORAL_TABLET | Freq: Every day | ORAL | 0 refills | Status: DC

## 2022-11-12 ENCOUNTER — Ambulatory Visit (INDEPENDENT_AMBULATORY_CARE_PROVIDER_SITE_OTHER): Payer: PPO | Admitting: Family Medicine

## 2022-11-12 VITALS — BP 118/70 | HR 76 | Temp 98.6°F | Ht 63.0 in | Wt 150.4 lb

## 2022-11-12 DIAGNOSIS — E785 Hyperlipidemia, unspecified: Secondary | ICD-10-CM | POA: Diagnosis not present

## 2022-11-12 DIAGNOSIS — F32A Depression, unspecified: Secondary | ICD-10-CM

## 2022-11-12 DIAGNOSIS — F419 Anxiety disorder, unspecified: Secondary | ICD-10-CM | POA: Diagnosis not present

## 2022-11-12 DIAGNOSIS — N1831 Chronic kidney disease, stage 3a: Secondary | ICD-10-CM | POA: Diagnosis not present

## 2022-11-12 DIAGNOSIS — R7303 Prediabetes: Secondary | ICD-10-CM | POA: Diagnosis not present

## 2022-11-12 DIAGNOSIS — Z87891 Personal history of nicotine dependence: Secondary | ICD-10-CM | POA: Diagnosis not present

## 2022-11-12 DIAGNOSIS — I10 Essential (primary) hypertension: Secondary | ICD-10-CM

## 2022-11-12 LAB — HEPATIC FUNCTION PANEL
ALT: 13 U/L (ref 0–35)
AST: 15 U/L (ref 0–37)
Albumin: 4.2 g/dL (ref 3.5–5.2)
Alkaline Phosphatase: 44 U/L (ref 39–117)
Bilirubin, Direct: 0.1 mg/dL (ref 0.0–0.3)
Total Bilirubin: 0.4 mg/dL (ref 0.2–1.2)
Total Protein: 6.5 g/dL (ref 6.0–8.3)

## 2022-11-12 LAB — LIPID PANEL
Cholesterol: 144 mg/dL (ref 0–200)
HDL: 66.9 mg/dL (ref >39.00)
LDL Cholesterol: 63 mg/dL (ref 0–99)
NonHDL: 76.73
Total CHOL/HDL Ratio: 2
Triglycerides: 67 mg/dL (ref 0.0–149.0)
VLDL: 13.4 mg/dL (ref 0.0–40.0)

## 2022-11-12 LAB — HEMOGLOBIN A1C: Hgb A1c MFr Bld: 6.2 % (ref 4.6–6.5)

## 2022-11-12 NOTE — Assessment & Plan Note (Signed)
Chronic issue.  Continue Crestor 5 mg daily.  Check lipid panel today.  Continue with dietary changes.

## 2022-11-12 NOTE — Patient Instructions (Signed)
Nice to see you. Please get the RSV vaccine, tetanus vaccine, and Shingrix vaccine at the pharmacy. The lung cancer screening folks should contact you within several weeks.  If you do not hear from them please let us know. We will get lab work today and contact you with the results.

## 2022-11-12 NOTE — Assessment & Plan Note (Signed)
Chronic issue.  Generally acceptable.  She will continue HCTZ 12.5 mg daily and lisinopril 40 mg daily.  Lab work today.

## 2022-11-12 NOTE — Assessment & Plan Note (Signed)
Chronic issue.  Generally stable.  She will continue to follow with nephrology.

## 2022-11-12 NOTE — Progress Notes (Signed)
[name], MD Phone: [phone]  Tammy Choi is a 77 y.o. female who presents today for f/u.  HYPERTENSION Disease Monitoring Home BP Monitoring similar to today       Chest pressure- no    Dyspnea-chronic and stable Medications Compliance-taking HCTZ, lisinopril.  Edema- no BMET    Component Value Date/Time   NA 138 05/14/2022 1239   K 5.2 (H) 05/14/2022 1239   CL 101 05/14/2022 1239   CO2 26 05/14/2022 1239   GLUCOSE 104 (H) 05/14/2022 1239   BUN 34 (H) 05/14/2022 1239   CREATININE 1.33 (H) 05/14/2022 1239   CREATININE 0.91 08/01/2018 1458   CALCIUM 9.0 05/14/2022 1239   GFRNONAA 41 (L) 05/14/2022 1239   GFRAA 41 (L) 05/30/2020 1304   Sharp chest pain: Patient notes very rarely she will get a sharp discomfort in her chest that lasts very briefly and goes away on its own.  She does not get any exertional chest pain.  Anxiety/depression: Patient notes this is significantly better.  At times she forgets to take one of her doses of buspirone.  She continues on Celexa as well.  No SI.  CKD stage III: She saw her nephrologist recently and she notes he gave her a hard time about her diet.  She is given up red meat and is eating mostly chicken, fish, and vegetables.  She gave up on junk food and salt as well.  Former smoker: Patient notes she quit about 6 years ago.  She smoked about a pack per day for 50 years.  Social History   Tobacco Use  Smoking Status Former   Packs/day: 1.00   Years: 35.00   Total pack years: 35.00   Types: Cigarettes   Quit date: 12/20/2015   Years since quitting: 6.9  Smokeless Tobacco Never    Current Outpatient Medications on File Prior to Visit  Medication Sig Dispense Refill   anastrozole (ARIMIDEX) 1 MG tablet Take 1 tablet by mouth once daily 90 tablet 0   busPIRone (BUSPAR) 5 MG tablet Take 1 tablet (5 mg total) by mouth 2 (two) times daily. 60 tablet 3   calcitRIOL (ROCALTROL) 0.25 MCG capsule Take 0.25 mcg by mouth every  other day.     Cholecalciferol 25 MCG (1000 UT) tablet Take by mouth.     citalopram (CELEXA) 20 MG tablet Take 1 tablet by mouth once daily 90 tablet 1   lisinopril (ZESTRIL) 40 MG tablet Take 1 tablet (40 mg total) by mouth daily. 90 tablet 0   PROAIR HFA 108 (90 Base) MCG/ACT inhaler INHALE 2 PUFFS BY MOUTH EVERY 6 HOURS AS NEEDED FOR WHEEZING OR SHORTNESS OF BREATH (Patient taking differently: Inhale 1-2 puffs into the lungs as needed for wheezing or shortness of breath.) 9 g 0   rosuvastatin (CRESTOR) 5 MG tablet Take 1 tablet by mouth once daily 90 tablet 3   SYMBICORT 160-4.5 MCG/ACT inhaler Inhale 2 puffs by mouth twice daily 33 g 0   No current facility-administered medications on file prior to visit.     ROS see history of present illness  Objective  Physical Exam Vitals:   11/12/22 1036 11/12/22 1105  BP: 128/82 118/70  Pulse: 76   Temp: 98.6 F (37 C)   SpO2: 96%     BP Readings from Last 3 Encounters:  11/12/22 118/70  07/11/22 135/81  06/20/22 (!) 148/82   Wt Readings from Last 3 Encounters:  11/12/22 150 lb 6.4 oz (68.2 kg)  07/11/22  154 lb (69.9 kg)  06/20/22 150 lb (68 kg)    Physical Exam Constitutional:      General: She is not in acute distress.    Appearance: She is not diaphoretic.  Cardiovascular:     Rate and Rhythm: Normal rate and regular rhythm.     Heart sounds: Normal heart sounds.  Pulmonary:     Effort: Pulmonary effort is normal.     Breath sounds: Normal breath sounds.  Musculoskeletal:     Right lower leg: No edema.     Left lower leg: No edema.  Skin:    General: Skin is warm and dry.  Neurological:     Mental Status: She is alert.      Assessment/Plan: Please see individual problem list.  Primary hypertension Assessment & Plan: Chronic issue.  Generally acceptable.  She will continue HCTZ 12.5 mg daily and lisinopril 40 mg daily.  Lab work today.   Anxiety and depression Assessment & Plan: Chronic issue.   Adequately controlled.  She will continue Celexa 20 mg daily and buspirone 5 mg twice daily.   Prediabetes Assessment & Plan: Check A1c.  Continue with dietary changes.  Orders: -     Hemoglobin A1c  Hyperlipidemia, unspecified hyperlipidemia type Assessment & Plan: Chronic issue.  Continue Crestor 5 mg daily.  Check lipid panel today.  Continue with dietary changes.  Orders: -     Hepatic function panel -     Lipid panel  Chronic kidney disease, stage 3a ([place]) Assessment & Plan: Chronic issue.  Generally stable.  She will continue to follow with nephrology.   Former smoker Assessment & Plan: Patient was referred for lung cancer screening.  Orders: -     Ambulatory Referral for Lung Cancer Scre     Health Maintenance: she will get her RSV, tetanus, and Shingrix vaccines at the pharmacy.  Return in about 6 months (around 05/13/2023) for Hypertension.   [name], MD [place]

## 2022-11-12 NOTE — Assessment & Plan Note (Signed)
Check A1c.  Continue with dietary changes.

## 2022-11-12 NOTE — Assessment & Plan Note (Signed)
Patient was referred for lung cancer screening.

## 2022-11-12 NOTE — Assessment & Plan Note (Signed)
Chronic issue.  Adequately controlled.  She will continue Celexa 20 mg daily and buspirone 5 mg twice daily.

## 2022-11-14 ENCOUNTER — Inpatient Hospital Stay: Payer: PPO

## 2022-11-14 ENCOUNTER — Other Ambulatory Visit: Payer: Self-pay | Admitting: Family

## 2022-11-14 ENCOUNTER — Inpatient Hospital Stay: Payer: PPO | Admitting: Oncology

## 2022-11-14 DIAGNOSIS — F32A Depression, unspecified: Secondary | ICD-10-CM

## 2022-11-29 DIAGNOSIS — J449 Chronic obstructive pulmonary disease, unspecified: Secondary | ICD-10-CM | POA: Diagnosis not present

## 2022-11-29 DIAGNOSIS — N2581 Secondary hyperparathyroidism of renal origin: Secondary | ICD-10-CM | POA: Diagnosis not present

## 2022-11-29 DIAGNOSIS — N281 Cyst of kidney, acquired: Secondary | ICD-10-CM | POA: Diagnosis not present

## 2022-11-29 DIAGNOSIS — R809 Proteinuria, unspecified: Secondary | ICD-10-CM | POA: Diagnosis not present

## 2022-11-29 DIAGNOSIS — I10 Essential (primary) hypertension: Secondary | ICD-10-CM | POA: Diagnosis not present

## 2022-11-29 DIAGNOSIS — N1330 Unspecified hydronephrosis: Secondary | ICD-10-CM | POA: Diagnosis not present

## 2022-11-29 DIAGNOSIS — D631 Anemia in chronic kidney disease: Secondary | ICD-10-CM | POA: Diagnosis not present

## 2022-11-29 DIAGNOSIS — N1831 Chronic kidney disease, stage 3a: Secondary | ICD-10-CM | POA: Diagnosis not present

## 2022-12-03 DIAGNOSIS — N2581 Secondary hyperparathyroidism of renal origin: Secondary | ICD-10-CM | POA: Diagnosis not present

## 2022-12-03 DIAGNOSIS — I129 Hypertensive chronic kidney disease with stage 1 through stage 4 chronic kidney disease, or unspecified chronic kidney disease: Secondary | ICD-10-CM | POA: Diagnosis not present

## 2022-12-03 DIAGNOSIS — N1330 Unspecified hydronephrosis: Secondary | ICD-10-CM | POA: Diagnosis not present

## 2022-12-03 DIAGNOSIS — R809 Proteinuria, unspecified: Secondary | ICD-10-CM | POA: Diagnosis not present

## 2022-12-03 DIAGNOSIS — I10 Essential (primary) hypertension: Secondary | ICD-10-CM | POA: Diagnosis not present

## 2022-12-11 ENCOUNTER — Inpatient Hospital Stay: Payer: PPO | Attending: Oncology

## 2022-12-11 ENCOUNTER — Encounter: Payer: Self-pay | Admitting: Oncology

## 2022-12-11 ENCOUNTER — Inpatient Hospital Stay: Payer: PPO

## 2022-12-11 ENCOUNTER — Inpatient Hospital Stay (HOSPITAL_BASED_OUTPATIENT_CLINIC_OR_DEPARTMENT_OTHER): Payer: PPO | Admitting: Oncology

## 2022-12-11 VITALS — BP 106/94 | HR 88 | Temp 97.5°F | Ht 62.0 in | Wt 149.2 lb

## 2022-12-11 DIAGNOSIS — Z803 Family history of malignant neoplasm of breast: Secondary | ICD-10-CM | POA: Insufficient documentation

## 2022-12-11 DIAGNOSIS — M818 Other osteoporosis without current pathological fracture: Secondary | ICD-10-CM

## 2022-12-11 DIAGNOSIS — M810 Age-related osteoporosis without current pathological fracture: Secondary | ICD-10-CM | POA: Insufficient documentation

## 2022-12-11 DIAGNOSIS — Z79811 Long term (current) use of aromatase inhibitors: Secondary | ICD-10-CM | POA: Insufficient documentation

## 2022-12-11 DIAGNOSIS — Z170 Estrogen receptor positive status [ER+]: Secondary | ICD-10-CM | POA: Insufficient documentation

## 2022-12-11 DIAGNOSIS — C50311 Malignant neoplasm of lower-inner quadrant of right female breast: Secondary | ICD-10-CM | POA: Insufficient documentation

## 2022-12-11 LAB — COMPREHENSIVE METABOLIC PANEL
ALT: 15 U/L (ref 0–44)
AST: 19 U/L (ref 15–41)
Albumin: 4.4 g/dL (ref 3.5–5.0)
Alkaline Phosphatase: 46 U/L (ref 38–126)
Anion gap: 12 (ref 5–15)
BUN: 38 mg/dL — ABNORMAL HIGH (ref 8–23)
CO2: 25 mmol/L (ref 22–32)
Calcium: 9.9 mg/dL (ref 8.9–10.3)
Chloride: 101 mmol/L (ref 98–111)
Creatinine, Ser: 1.63 mg/dL — ABNORMAL HIGH (ref 0.44–1.00)
GFR, Estimated: 32 mL/min — ABNORMAL LOW (ref >60)
Glucose, Bld: 101 mg/dL — ABNORMAL HIGH (ref 70–99)
Potassium: 5.1 mmol/L (ref 3.5–5.1)
Sodium: 138 mmol/L (ref 135–145)
Total Bilirubin: 0.4 mg/dL (ref 0.3–1.2)
Total Protein: 7.8 g/dL (ref 6.5–8.1)

## 2022-12-11 LAB — CBC WITH DIFFERENTIAL/PLATELET
Abs Immature Granulocytes: 0.03 10*3/uL (ref 0.00–0.07)
Basophils Absolute: 0 10*3/uL (ref 0.0–0.1)
Basophils Relative: 0 %
Eosinophils Absolute: 0.1 10*3/uL (ref 0.0–0.5)
Eosinophils Relative: 2 %
HCT: 41.4 % (ref 36.0–46.0)
Hemoglobin: 13 g/dL (ref 12.0–15.0)
Immature Granulocytes: 0 %
Lymphocytes Relative: 47 %
Lymphs Abs: 3.5 10*3/uL (ref 0.7–4.0)
MCH: 28.5 pg (ref 26.0–34.0)
MCHC: 31.4 g/dL (ref 30.0–36.0)
MCV: 90.8 fL (ref 80.0–100.0)
Monocytes Absolute: 0.5 10*3/uL (ref 0.1–1.0)
Monocytes Relative: 7 %
Neutro Abs: 3.3 10*3/uL (ref 1.7–7.7)
Neutrophils Relative %: 44 %
Platelets: 229 10*3/uL (ref 150–400)
RBC: 4.56 MIL/uL (ref 3.87–5.11)
RDW: 13.9 % (ref 11.5–15.5)
WBC: 7.5 10*3/uL (ref 4.0–10.5)
nRBC: 0 % (ref 0.0–0.2)

## 2022-12-11 MED ORDER — ANASTROZOLE 1 MG PO TABS: 1.0000 mg | ORAL_TABLET | Freq: Every day | ORAL | 1 refills | Status: DC

## 2022-12-11 MED ORDER — DENOSUMAB 60 MG/ML ~~LOC~~ SOSY
60.0000 mg | PREFILLED_SYRINGE | Freq: Once | SUBCUTANEOUS | Status: AC
  Administered 2022-12-11: 60 mg via SUBCUTANEOUS
  Filled 2022-12-11: qty 1

## 2022-12-11 NOTE — Progress Notes (Signed)
Hematology/Oncology Progress note Telephone:([phone]) [phone] Fax:([phone]) [phone]   REASON FOR VISIT:  Follow up for breast cancer  ASSESSMENT & PLAN:   Cancer Staging  Malignant neoplasm of lower-inner quadrant of right breast of female, estrogen receptor positive ([name]) Staging form: Breast, AJCC 8th Edition - Pathologic stage from 09/29/2018: Stage IA (pT1a, pN0, cM0, G2, ER+, PR-, HER2-) - Signed by Tammy Server, MD on 09/29/2018   Malignant neoplasm of lower-inner quadrant of right breast of female, estrogen receptor positive ([name]) Stage IA breast cancer ER positive, PR negative, HER-2 negative. Clinically she is doing well.  Labs are reviewed and discussed with patient. Continue Arimidex- plan 5 year till Jan 2025 if she tolerates Annual mammogram, due in Sept 2024, she get mammogram ordered through surgeon's office.   Osteoporosis she has full mouth dentures.   DEXA in Sept 2023 showed osteoporosis Continue calcium and vitamin D supplementation.  Proceed with Prolia today.   Orders Placed This Encounter  Procedures   CBC with Differential ([place] Only)    Standing Status:   Future    Standing Expiration Date:   12/11/2023   CMP ([place] only)    Standing Status:   Future    Standing Expiration Date:   12/11/2023   Follow up in 6 months.  All questions were answered. The patient knows to call the clinic with any problems, questions or concerns.  Tammy Server, MD, PhD [facility] Hematology Oncology 12/11/2022   HISTORY OF PRESENTING ILLNESS:  Tammy Choi is a  77 y.o.  female with PMH listed below who was referred to me for evaluation of breast cancer. Patient had mammogram 05/01/2018 which showed indeterminate microcalcification in the medial right breast measuring 0.9 x 0.7 x 0.7 cm Biopsy pathology showed: Invasive mammary carcinoma, DCIS, calcification associated with DCIS, grade 2, ER positive PR negative, HER-2 negative (IHC 1+)  Nipple discharge:  Denies Family history: Sister passed away from breast cancer History of radiation to chest: denies.  Previous breast surgery: Left breast biopsy with benign etiology. . 06/16/2018, right breast lumpectomy and sentinel lymph node biopsy showed residual invasive mammary carcinoma, residual ductal carcinoma in situ, biopsy site changes and metallic clip.  Incidental 5 mm fibroadenoma.  All 3 sentinel lymph node negative. ER positive HER2 negative.  Patient underwent adjuvant radiation finished in January 2020. Started aromatase inhibitor with Arimidex 1 mg daily since 1/20/ 2020.  # she followed up with urology Dr. Diamantina Providence for bilateral hydronephrosis. Patient was recommended to have CT urogram done for evaluation of filling defects with monitoring kidney function versus more invasive investigation with cystoscopy, bilateral retrograde pyelograms and possible ureteroscopy possible biopsy.she choose to have a image surveillance with monitoring of kidney function.  # Arimidex  Since Jan 2020.  #Chronic bilateral hydronephrosis, she has had work up with Urology and no obstruction has been found.  INTERVAL HISTORY Tammy Choi is a 77 y.o. female who has above history reviewed by me today presents for follow up visit for management of Stage IA right breast cancer, ER+ HER2 - She has no new complaints.  Patient takes Arimidex and tolerates well.   Review of Systems  Constitutional:  Negative for chills, fever, malaise/fatigue and weight loss.  HENT:  Negative for nosebleeds and sore throat.   Eyes:  Negative for double vision, photophobia and redness.  Respiratory:  Negative for cough, shortness of breath and wheezing.   Cardiovascular:  Negative for chest pain, palpitations, orthopnea and leg swelling.  Gastrointestinal:  Negative for abdominal pain, blood in stool, nausea and vomiting.  Genitourinary:  Negative for dysuria.  Musculoskeletal:  Negative for back pain, myalgias and neck pain.        Right hip replacement.   Skin:  Negative for itching and rash.  Neurological:  Negative for dizziness, tingling and tremors.  Endo/Heme/Allergies:  Negative for environmental allergies. Does not bruise/bleed easily.  Psychiatric/Behavioral:  Negative for depression and hallucinations. The patient is not nervous/anxious.     MEDICAL HISTORY:  Past Medical History:  Diagnosis Date   Arthritis    Breast cancer ([place]) 05/07/2018   T1a, N0; ER/ PR positive, Her 2 neu not overexpressed.    COPD (chronic obstructive pulmonary disease) ([place])    Not on home o2   Depression    HTN (hypertension)    Hyperlipemia    Osteoporosis 07/28/2020   Personal history of radiation therapy     SURGICAL HISTORY: Past Surgical History:  Procedure Laterality Date   ABDOMINAL HYSTERECTOMY     BREAST BIOPSY Left 04/30/2013   neg core   BREAST BIOPSY Right 05/07/2018   right breast stereo x clip INVASIVE MAMMARY CARCINOMA   BREAST LUMPECTOMY Right 2019   BREAST LUMPECTOMY WITH SENTINEL LYMPH NODE BIOPSY Right 06/16/2018   Procedure: BREAST LUMPECTOMY WITH SENTINEL LYMPH NODE BX;  Surgeon: Robert Bellow, MD;  Location: [facility] ORS;  Service: General;  Laterality: Right;   COLONOSCOPY  2014   Dr Bary Castilla   Left Leg Surgery     TONSILLECTOMY     TOTAL HIP ARTHROPLASTY Right 03/29/2020   Procedure: TOTAL HIP ARTHROPLASTY;  Surgeon: Corky Mull, MD;  Location: [facility] ORS;  Service: Orthopedics;  Laterality: Right;    SOCIAL HISTORY: Social History   Socioeconomic History   Marital status: Divorced    Spouse name: Not on file   Number of children: Not on file   Years of education: Not on file   Highest education level: Not on file  Occupational History   Occupation: Retired Database administrator: retired  Tobacco Use   Smoking status: Former    Packs/day: 1.00    Years: 35.00    Total pack years: 35.00    Types: Cigarettes    Quit date: 12/20/2015    Years since quitting: 6.9    Smokeless tobacco: Never  Vaping Use   Vaping Use: Never used  Substance and Sexual Activity   Alcohol use: No    Alcohol/week: 0.0 standard drinks of alcohol   Drug use: No   Sexual activity: Not on file  Other Topics Concern   Not on file  Social History Narrative   Lives with daughter at home. Independent at baseline.   Social Determinants of Health   Financial Resource Strain: Low Risk  (03/13/2022)   Overall Financial Resource Strain (CARDIA)    Difficulty of Paying Living Expenses: Not hard at all  Food Insecurity: No Food Insecurity (03/13/2022)   Hunger Vital Sign    Worried About Running Out of Food in the Last Year: Never true    Ran Out of Food in the Last Year: Never true  Transportation Needs: No Transportation Needs (03/13/2022)   PRAPARE - Hydrologist (Medical): No    Lack of Transportation (Non-Medical): No  Physical Activity: Not on file  Stress: No Stress Concern Present (03/13/2022)   [place]    Feeling of Stress : Not  at all  Social Connections: Unknown (03/13/2022)   Social Connection and Isolation Panel [NHANES]    Frequency of Communication with Friends and Family: Not on file    Frequency of Social Gatherings with Friends and Family: More than three times a week    Attends Religious Services: Not on file    Active Member of Clubs or Organizations: Not on file    Attends Archivist Meetings: Not on file    Marital Status: Not on file  Intimate Partner Violence: Not At Risk (03/13/2022)   Humiliation, Afraid, Rape, and Kick questionnaire    Fear of Current or Ex-Partner: No    Emotionally Abused: No    Physically Abused: No    Sexually Abused: No    FAMILY HISTORY: Family History  Problem Relation Age of Onset   Kidney disease Mother        deceased 42   Heart disease Father        deceased 34   Breast cancer Other 53       maternal  half-sister; deceased 42   Colon cancer Neg Hx     ALLERGIES:  is allergic to penicillins.  MEDICATIONS:  Current Outpatient Medications  Medication Sig Dispense Refill   busPIRone (BUSPAR) 5 MG tablet Take 1 tablet (5 mg total) by mouth 2 (two) times daily. 60 tablet 3   calcitRIOL (ROCALTROL) 0.25 MCG capsule Take 0.25 mcg by mouth every other day.     Cholecalciferol 25 MCG (1000 UT) tablet Take by mouth.     citalopram (CELEXA) 20 MG tablet Take 1 tablet by mouth once daily 90 tablet 1   lisinopril (ZESTRIL) 40 MG tablet Take 1 tablet (40 mg total) by mouth daily. 90 tablet 0   PROAIR HFA 108 (90 Base) MCG/ACT inhaler INHALE 2 PUFFS BY MOUTH EVERY 6 HOURS AS NEEDED FOR WHEEZING OR SHORTNESS OF BREATH (Patient taking differently: Inhale 1-2 puffs into the lungs as needed for wheezing or shortness of breath.) 9 g 0   rosuvastatin (CRESTOR) 5 MG tablet Take 1 tablet by mouth once daily 90 tablet 3   SYMBICORT 160-4.5 MCG/ACT inhaler Inhale 2 puffs by mouth twice daily 33 g 0   anastrozole (ARIMIDEX) 1 MG tablet Take 1 tablet (1 mg total) by mouth daily. 90 tablet 1   No current facility-administered medications for this visit.     PHYSICAL EXAMINATION:  Vitals:   12/11/22 1020  BP: (!) 106/94  Pulse: 88  Temp: (!) 97.5 F (36.4 C)  SpO2: 97%   Filed Weights   12/11/22 1020  Weight: 149 lb 3.2 oz (67.7 kg)    Physical Exam Constitutional:      General: She is not in acute distress. HENT:     Head: Normocephalic and atraumatic.  Eyes:     General: No scleral icterus.    Pupils: Pupils are equal, round, and reactive to light.  Cardiovascular:     Rate and Rhythm: Normal rate and regular rhythm.     Heart sounds: Normal heart sounds.  Pulmonary:     Effort: Pulmonary effort is normal. No respiratory distress.     Breath sounds: No wheezing.  Abdominal:     General: Bowel sounds are normal. There is no distension.     Palpations: Abdomen is soft. There is no mass.      Tenderness: There is no abdominal tenderness.  Musculoskeletal:        General: No deformity. Normal range of motion.  Cervical back: Normal range of motion and neck supple.  Skin:    General: Skin is warm and dry.     Findings: No erythema or rash.  Neurological:     Mental Status: She is alert and oriented to person, place, and time.     Cranial Nerves: No cranial nerve deficit.     Coordination: Coordination normal.  Psychiatric:        Behavior: Behavior normal.        Thought Content: Thought content normal.   Breast exam was performed in seated and lying down position. Patient is status post right breast lumpectomy with a well-healed surgical scar, chronic tissue thickening around her scar about quarter size. Marland Kitchen  No palpable mass in left breast.  No palpable axillary lymphadenopathy.      LABORATORY DATA:  I have reviewed the data as listed    Latest Ref Rng & Units 12/11/2022    9:58 AM 05/14/2022   12:39 PM 05/12/2021   11:46 AM  CBC  WBC 4.0 - 10.5 K/uL 7.5  7.5  6.5   Hemoglobin 12.0 - 15.0 g/dL 13.0  12.7  11.1   Hematocrit 36.0 - 46.0 % 41.4  40.6  35.9   Platelets 150 - 400 K/uL 229  217  288       Latest Ref Rng & Units 12/11/2022    9:58 AM 11/12/2022   11:05 AM 05/14/2022   12:39 PM  CMP  Glucose 70 - 99 mg/dL 101   104   BUN 8 - 23 mg/dL 38   34   Creatinine 0.44 - 1.00 mg/dL 1.63   1.33   Sodium 135 - 145 mmol/L 138   138   Potassium 3.5 - 5.1 mmol/L 5.1   5.2   Chloride 98 - 111 mmol/L 101   101   CO2 22 - 32 mmol/L 25   26   Calcium 8.9 - 10.3 mg/dL 9.9   9.0   Total Protein 6.5 - 8.1 g/dL 7.8  6.5  7.6   Total Bilirubin 0.3 - 1.2 mg/dL 0.4  0.4  0.4   Alkaline Phos 38 - 126 U/L 46  44  46   AST 15 - 41 U/L '19  15  20   '$ ALT 0 - 44 U/L '15  13  17       '$ RADIOGRAPHIC STUDIES: I have personally reviewed the radiological images as listed and agreed with the findings in the report.

## 2022-12-11 NOTE — Assessment & Plan Note (Addendum)
she has full mouth dentures.   DEXA in Sept 2023 showed osteoporosis Continue calcium and vitamin D supplementation.  Proceed with Prolia today.

## 2022-12-11 NOTE — Assessment & Plan Note (Addendum)
Stage IA breast cancer ER positive, PR negative, HER-2 negative. Clinically she is doing well.  Labs are reviewed and discussed with patient. Continue Arimidex- plan 5 year till Jan 2025 if she tolerates Annual mammogram, due in Sept 2024, she get mammogram ordered through surgeon's office.

## 2022-12-13 ENCOUNTER — Other Ambulatory Visit: Payer: Self-pay | Admitting: Internal Medicine

## 2022-12-13 DIAGNOSIS — F411 Generalized anxiety disorder: Secondary | ICD-10-CM

## 2023-01-06 ENCOUNTER — Other Ambulatory Visit: Payer: Self-pay | Admitting: Family Medicine

## 2023-01-13 ENCOUNTER — Telehealth: Payer: Self-pay | Admitting: Family Medicine

## 2023-01-13 DIAGNOSIS — F419 Anxiety disorder, unspecified: Secondary | ICD-10-CM

## 2023-01-14 NOTE — Telephone Encounter (Signed)
Pt would like a rx for the generic

## 2023-01-24 NOTE — Telephone Encounter (Signed)
Pt need a refill on busPIRone sent to [facility] 

## 2023-01-25 MED ORDER — BUSPIRONE HCL 5 MG PO TABS: 5.0000 mg | ORAL_TABLET | Freq: Two times a day (BID) | ORAL | 3 refills | Status: DC

## 2023-01-25 NOTE — Addendum Note (Signed)
Addended by: Benedict Needy on: 01/25/2023 04:29 PM   Modules accepted: Orders

## 2023-01-29 ENCOUNTER — Other Ambulatory Visit: Payer: Self-pay | Admitting: Family Medicine

## 2023-01-29 DIAGNOSIS — I10 Essential (primary) hypertension: Secondary | ICD-10-CM

## 2023-01-31 ENCOUNTER — Encounter: Payer: Self-pay | Admitting: *Deleted

## 2023-03-15 ENCOUNTER — Ambulatory Visit: Payer: PPO

## 2023-03-25 ENCOUNTER — Ambulatory Visit (INDEPENDENT_AMBULATORY_CARE_PROVIDER_SITE_OTHER): Payer: PPO

## 2023-03-25 ENCOUNTER — Telehealth: Payer: Self-pay | Admitting: Family Medicine

## 2023-03-25 VITALS — Ht 63.0 in | Wt 150.0 lb

## 2023-03-25 DIAGNOSIS — Z0100 Encounter for examination of eyes and vision without abnormal findings: Secondary | ICD-10-CM

## 2023-03-25 DIAGNOSIS — Z0000 Encounter for general adult medical examination without abnormal findings: Secondary | ICD-10-CM

## 2023-03-25 NOTE — Patient Instructions (Signed)
[name] , Thank you for taking time to come for your Medicare Wellness Visit. I appreciate your ongoing commitment to your health goals. Please review the following plan we discussed and let me know if I can assist you in the future.   These are the goals we discussed:  Goals       DIET - INCREASE WATER INTAKE      Follow up with Primary Care Provider (pt-stated)      As needed.        This is a list of the screening recommended for you and due dates:  Health Maintenance  Topic Date Due   COVID-19 Vaccine (1) Never done   Screening for Lung Cancer  02/08/2017   Flu Shot  05/02/2023   Zoster (Shingles) Vaccine (2 of 2) 05/05/2023   Mammogram  06/12/2023   Medicare Annual Wellness Visit  03/24/2024   DEXA scan (bone density measurement)  08/29/2024   DTaP/Tdap/Td vaccine (2 - Td or Tdap) 03/09/2033   Pneumonia Vaccine  Completed   Hepatitis C Screening  Completed   HPV Vaccine  Aged Out   Colon Cancer Screening  Discontinued    Advanced directives: Advance directive discussed with you today. I have provided a copy for you to complete at home and have notarized. Once this is complete please bring a copy in to our office so we can scan it into your chart.   Conditions/risks identified: Aim for 30 minutes of exercise or brisk walking, 6-8 glasses of water, and 5 servings of fruits and vegetables each day.   Next appointment: Follow up in one year for your annual wellness visit    Preventive Care 65 Years and Older, Female Preventive care refers to lifestyle choices and visits with your health care provider that can promote health and wellness. What does preventive care include? A yearly physical exam. This is also called an annual well check. Dental exams once or twice a year. Routine eye exams. Ask your health care provider how often you should have your eyes checked. Personal lifestyle choices, including: Daily care of your teeth and gums. Regular physical  activity. Eating a healthy diet. Avoiding tobacco and drug use. Limiting alcohol use. Practicing safe sex. Taking low-dose aspirin every day. Taking vitamin and mineral supplements as recommended by your health care provider. What happens during an annual well check? The services and screenings done by your health care provider during your annual well check will depend on your age, overall health, lifestyle risk factors, and family history of disease. Counseling  Your health care provider may ask you questions about your: Alcohol use. Tobacco use. Drug use. Emotional well-being. Home and relationship well-being. Sexual activity. Eating habits. History of falls. Memory and ability to understand (cognition). Work and work Astronomer. Reproductive health. Screening  You may have the following tests or measurements: Height, weight, and BMI. Blood pressure. Lipid and cholesterol levels. These may be checked every 5 years, or more frequently if you are over 66 years old. Skin check. Lung cancer screening. You may have this screening every year starting at age 5 if you have a 30-pack-year history of smoking and currently smoke or have quit within the past 15 years. Fecal occult blood test (FOBT) of the stool. You may have this test every year starting at age 1. Flexible sigmoidoscopy or colonoscopy. You may have a sigmoidoscopy every 5 years or a colonoscopy every 10 years starting at age 39. Hepatitis C blood test. Hepatitis B blood  test. Sexually transmitted disease (STD) testing. Diabetes screening. This is done by checking your blood sugar (glucose) after you have not eaten for a while (fasting). You may have this done every 1-3 years. Bone density scan. This is done to screen for osteoporosis. You may have this done starting at age 40. Mammogram. This may be done every 1-2 years. Talk to your health care provider about how often you should have regular mammograms. Talk with your  health care provider about your test results, treatment options, and if necessary, the need for more tests. Vaccines  Your health care provider may recommend certain vaccines, such as: Influenza vaccine. This is recommended every year. Tetanus, diphtheria, and acellular pertussis (Tdap, Td) vaccine. You may need a Td booster every 10 years. Zoster vaccine. You may need this after age 49. Pneumococcal 13-valent conjugate (PCV13) vaccine. One dose is recommended after age 15. Pneumococcal polysaccharide (PPSV23) vaccine. One dose is recommended after age 58. Talk to your health care provider about which screenings and vaccines you need and how often you need them. This information is not intended to replace advice given to you by your health care provider. Make sure you discuss any questions you have with your health care provider. Document Released: 10/14/2015 Document Revised: 06/06/2016 Document Reviewed: 07/19/2015 Elsevier Interactive Patient Education  2017 [place] Prevention in the Home Falls can cause injuries. They can happen to people of all ages. There are many things you can do to make your home safe and to help prevent falls. What can I do on the outside of my home? Regularly fix the edges of walkways and driveways and fix any cracks. Remove anything that might make you trip as you walk through a door, such as a raised step or threshold. Trim any bushes or trees on the path to your home. Use bright outdoor lighting. Clear any walking paths of anything that might make someone trip, such as rocks or tools. Regularly check to see if handrails are loose or broken. Make sure that both sides of any steps have handrails. Any raised decks and porches should have guardrails on the edges. Have any leaves, snow, or ice cleared regularly. Use sand or salt on walking paths during winter. Clean up any spills in your garage right away. This includes oil or grease spills. What can I  do in the bathroom? Use night lights. Install grab bars by the toilet and in the tub and shower. Do not use towel bars as grab bars. Use non-skid mats or decals in the tub or shower. If you need to sit down in the shower, use a plastic, non-slip stool. Keep the floor dry. Clean up any water that spills on the floor as soon as it happens. Remove soap buildup in the tub or shower regularly. Attach bath mats securely with double-sided non-slip rug tape. Do not have throw rugs and other things on the floor that can make you trip. What can I do in the bedroom? Use night lights. Make sure that you have a light by your bed that is easy to reach. Do not use any sheets or blankets that are too big for your bed. They should not hang down onto the floor. Have a firm chair that has side arms. You can use this for support while you get dressed. Do not have throw rugs and other things on the floor that can make you trip. What can I do in the kitchen? Clean up any spills right  away. Avoid walking on wet floors. Keep items that you use a lot in easy-to-reach places. If you need to reach something above you, use a strong step stool that has a grab bar. Keep electrical cords out of the way. Do not use floor polish or wax that makes floors slippery. If you must use wax, use non-skid floor wax. Do not have throw rugs and other things on the floor that can make you trip. What can I do with my stairs? Do not leave any items on the stairs. Make sure that there are handrails on both sides of the stairs and use them. Fix handrails that are broken or loose. Make sure that handrails are as long as the stairways. Check any carpeting to make sure that it is firmly attached to the stairs. Fix any carpet that is loose or worn. Avoid having throw rugs at the top or bottom of the stairs. If you do have throw rugs, attach them to the floor with carpet tape. Make sure that you have a light switch at the top of the stairs  and the bottom of the stairs. If you do not have them, ask someone to add them for you. What else can I do to help prevent falls? Wear shoes that: Do not have high heels. Have rubber bottoms. Are comfortable and fit you well. Are closed at the toe. Do not wear sandals. If you use a stepladder: Make sure that it is fully opened. Do not climb a closed stepladder. Make sure that both sides of the stepladder are locked into place. Ask someone to hold it for you, if possible. Clearly mark and make sure that you can see: Any grab bars or handrails. First and last steps. Where the edge of each step is. Use tools that help you move around (mobility aids) if they are needed. These include: Canes. Walkers. Scooters. Crutches. Turn on the lights when you go into a dark area. Replace any light bulbs as soon as they burn out. Set up your furniture so you have a clear path. Avoid moving your furniture around. If any of your floors are uneven, fix them. If there are any pets around you, be aware of where they are. Review your medicines with your doctor. Some medicines can make you feel dizzy. This can increase your chance of falling. Ask your doctor what other things that you can do to help prevent falls. This information is not intended to replace advice given to you by your health care provider. Make sure you discuss any questions you have with your health care provider. Document Released: 07/14/2009 Document Revised: 02/23/2016 Document Reviewed: 10/22/2014 Elsevier Interactive Patient Education  2017 [employer].

## 2023-03-25 NOTE — Progress Notes (Signed)
Subjective:   Tammy Choi is a 77 y.o. female who presents for Medicare Annual (Subsequent) preventive examination.  Visit Complete: Virtual  I connected with  Tammy Choi on 03/25/23 by a audio enabled telemedicine application and verified that I am speaking with the correct person using two identifiers.  Patient Location: Home  Provider Location: Home Office  I discussed the limitations of evaluation and management by telemedicine. The patient expressed understanding and agreed to proceed.  Patient Medicare AWV questionnaire was completed by the patient on 03/25/2023; I have confirmed that all information answered by patient is correct and no changes since this date.  Review of Systems     Cardiac Risk Factors include: advanced age (>33men, >62 women);dyslipidemia;hypertension     Objective:    Today's Vitals   03/25/23 1042  Weight: 150 lb (68 kg)  Height: 5\' 3"  (1.6 m)   Body mass index is 26.57 kg/m.     03/25/2023   10:47 AM 12/11/2022   10:25 AM 05/14/2022    1:02 PM 04/11/2022   10:31 AM 03/13/2022    9:53 AM 05/12/2021   12:08 PM 04/05/2021   10:30 AM  Advanced Directives  Does Patient Have a Medical Advance Directive? No No No No No No No  Would patient like information on creating a medical advance directive? No - Patient declined No - Patient declined  No - Patient declined No - Patient declined      Current Medications (verified) Outpatient Encounter Medications as of 03/25/2023  Medication Sig   anastrozole (ARIMIDEX) 1 MG tablet Take 1 tablet (1 mg total) by mouth daily.   budesonide-formoterol (SYMBICORT) 160-4.5 MCG/ACT inhaler Inhale 2 puffs into the lungs 2 (two) times daily.   busPIRone (BUSPAR) 5 MG tablet Take 1 tablet (5 mg total) by mouth 2 (two) times daily.   calcitRIOL (ROCALTROL) 0.25 MCG capsule Take 0.25 mcg by mouth every other day.   Cholecalciferol 25 MCG (1000 UT) tablet Take by mouth.   citalopram (CELEXA) 20 MG tablet  Take 1 tablet by mouth once daily   lisinopril (ZESTRIL) 40 MG tablet Take 1 tablet by mouth once daily   PROAIR HFA 108 (90 Base) MCG/ACT inhaler INHALE 2 PUFFS BY MOUTH EVERY 6 HOURS AS NEEDED FOR WHEEZING OR SHORTNESS OF BREATH (Patient taking differently: Inhale 1-2 puffs into the lungs as needed for wheezing or shortness of breath.)   rosuvastatin (CRESTOR) 5 MG tablet Take 1 tablet by mouth once daily   No facility-administered encounter medications on file as of 03/25/2023.    Allergies (verified) Penicillins   History: Past Medical History:  Diagnosis Date   Arthritis    Breast cancer (HCC) 05/07/2018   T1a, N0; ER/ PR positive, Her 2 neu not overexpressed.    COPD (chronic obstructive pulmonary disease) (HCC)    Not on home o2   Depression    HTN (hypertension)    Hyperlipemia    Osteoporosis 07/28/2020   Personal history of radiation therapy    Past Surgical History:  Procedure Laterality Date   ABDOMINAL HYSTERECTOMY     BREAST BIOPSY Left 04/30/2013   neg core   BREAST BIOPSY Right 05/07/2018   right breast stereo x clip INVASIVE MAMMARY CARCINOMA   BREAST LUMPECTOMY Right 2019   BREAST LUMPECTOMY WITH SENTINEL LYMPH NODE BIOPSY Right 06/16/2018   Procedure: BREAST LUMPECTOMY WITH SENTINEL LYMPH NODE BX;  Surgeon: Earline Mayotte, MD;  Location: [facility] ORS;  Service: General;  Laterality:  Right;   COLONOSCOPY  2014   Dr Lemar Livings   Left Leg Surgery     TONSILLECTOMY     TOTAL HIP ARTHROPLASTY Right 03/29/2020   Procedure: TOTAL HIP ARTHROPLASTY;  Surgeon: Christena Flake, MD;  Location: [facility] ORS;  Service: Orthopedics;  Laterality: Right;   Family History  Problem Relation Age of Onset   Kidney disease Mother        deceased 22   Heart disease Father        deceased 85   Breast cancer Other 84       maternal half-sister; deceased 7   Colon cancer Neg Hx    Social History   Socioeconomic History   Marital status: Divorced    Spouse name: Not on file    Number of children: Not on file   Years of education: Not on file   Highest education level: Not on file  Occupational History   Occupation: Retired Landscape architect: retired  Tobacco Use   Smoking status: Former    Packs/day: 1.00    Years: 35.00    Additional pack years: 0.00    Total pack years: 35.00    Types: Cigarettes    Quit date: 12/20/2015    Years since quitting: 7.2   Smokeless tobacco: Never  Vaping Use   Vaping Use: Never used  Substance and Sexual Activity   Alcohol use: No    Alcohol/week: 0.0 standard drinks of alcohol   Drug use: No   Sexual activity: Not on file  Other Topics Concern   Not on file  Social History Narrative   Lives with daughter at home. Independent at baseline.   Social Determinants of Health   Financial Resource Strain: Low Risk  (03/25/2023)   Overall Financial Resource Strain (CARDIA)    Difficulty of Paying Living Expenses: Not hard at all  Food Insecurity: No Food Insecurity (03/25/2023)   Hunger Vital Sign    Worried About Running Out of Food in the Last Year: Never true    Ran Out of Food in the Last Year: Never true  Transportation Needs: No Transportation Needs (03/25/2023)   PRAPARE - Administrator, Civil Service (Medical): No    Lack of Transportation (Non-Medical): No  Physical Activity: Sufficiently Active (03/25/2023)   Exercise Vital Sign    Days of Exercise per Week: 5 days    Minutes of Exercise per Session: 30 min  Stress: No Stress Concern Present (03/25/2023)   [employer] of Occupational Health - Occupational Stress Questionnaire    Feeling of Stress : Not at all  Social Connections: Moderately Isolated (03/25/2023)   Social Connection and Isolation Panel [NHANES]    Frequency of Communication with Friends and Family: More than three times a week    Frequency of Social Gatherings with Friends and Family: More than three times a week    Attends Religious Services: More than 4 times  per year    Active Member of [employer] or Organizations: No    Attends Engineer, structural: Never    Marital Status: Divorced    Tobacco Counseling Counseling given: Not Answered   Clinical Intake:  Pre-visit preparation completed: Yes  Pain : No/denies pain     Nutritional Risks: None Diabetes: No  How often do you need to have someone help you when you read instructions, pamphlets, or other written materials from your doctor or pharmacy?: 1 - Never  Interpreter Needed?: No  Information entered by :: Renie Ora, LPN   Activities of Daily Living    03/25/2023   10:47 AM  In your present state of health, do you have any difficulty performing the following activities:  Hearing? 0  Vision? 0  Difficulty concentrating or making decisions? 0  Walking or climbing stairs? 0  Dressing or bathing? 0  Doing errands, shopping? 0  Preparing Food and eating ? N  Using the Toilet? N  In the past six months, have you accidently leaked urine? N  Do you have problems with loss of bowel control? N  Managing your Medications? N  Managing your Finances? N  Housekeeping or managing your Housekeeping? N    Patient Care Team: Glori Luis, MD as PCP - General (Family Medicine) Rickard Patience, MD as Consulting Physician (Oncology) Hyler, Janine Limbo, NP as Nurse Practitioner (Nephrology) Leafy Ro, MD as Consulting Physician (General Surgery)  Indicate any recent Medical Services you may have received from other than [facility] providers in the past year (date may be approximate).     Assessment:   This is a routine wellness examination for Orine.  Hearing/Vision screen Vision Screening - Comments:: Referral 03/25/2023  Dietary issues and exercise activities discussed:     Goals Addressed             This Visit's Progress    DIET - INCREASE WATER INTAKE         Depression Screen    03/25/2023   10:46 AM 11/12/2022   10:35 AM 05/11/2022   10:42 AM 03/13/2022     9:51 AM 02/06/2022   10:52 AM 11/23/2021    3:39 PM 08/21/2021   10:46 AM  PHQ 2/9 Scores  PHQ - 2 Score 0 0 0 0 0 2 0  PHQ- 9 Score  0    3     Fall Risk    03/25/2023   10:44 AM 11/12/2022   10:35 AM 07/11/2022    9:40 AM 06/20/2022    2:15 PM 05/11/2022   10:42 AM  Fall Risk   Falls in the past year? 0 0 0 0 0  Number falls in past yr: 0 0   0  Injury with Fall? 0 0   0  Risk for fall due to : No Fall Risks No Fall Risks   No Fall Risks  Follow up Falls prevention discussed Falls evaluation completed   Falls evaluation completed    MEDICARE RISK AT HOME:  Medicare Risk at Home - 03/25/23 1045     Any stairs in or around the home? No    If so, are there any without handrails? No    Home free of loose throw rugs in walkways, pet beds, electrical cords, etc? Yes    Adequate lighting in your home to reduce risk of falls? Yes    Life alert? No    Use of a cane, walker or w/c? No    Grab bars in the bathroom? Yes    Shower chair or bench in shower? No    Elevated toilet seat or a handicapped toilet? No             TIMED UP AND GO:  Was the test performed?  No    Cognitive Function:        03/25/2023   10:47 AM 03/10/2021    2:20 PM 03/09/2020    2:49 PM 03/03/2019    9:47 AM  6CIT Screen  What  Year? 0 points 0 points 0 points 0 points  What month? 0 points 0 points 0 points 0 points  What time? 0 points 0 points  0 points  Count back from 20 0 points   0 points  Months in reverse 0 points 0 points 0 points 0 points  Repeat phrase 0 points  4 points 0 points  Total Score 0 points   0 points    Immunizations Immunization History  Administered Date(s) Administered   Fluad Quad(high Dose 65+) 06/01/2019, 07/14/2020, 08/21/2021, 09/04/2022   Influenza Split 08/07/2011, 07/16/2012   Influenza, High Dose Seasonal PF 07/10/2016, 08/01/2017, 07/10/2018   Influenza,inj,Quad PF,6+ Mos 07/30/2014, 07/21/2015   Influenza-Unspecified 07/27/2013   Pneumococcal  Conjugate-13 03/30/2014   Pneumococcal Polysaccharide-23 07/16/2010   Tdap 03/10/2023   Zoster Recombinat (Shingrix) 03/10/2023    TDAP status: Up to date  Flu Vaccine status: Up to date  Pneumococcal vaccine status: Up to date  Covid-19 vaccine status: Completed vaccines  Qualifies for Shingles Vaccine? Yes   Zostavax completed Yes   Shingrix Completed?: Yes  Screening Tests Health Maintenance  Topic Date Due   COVID-19 Vaccine (1) Never done   Lung Cancer Screening  02/08/2017   INFLUENZA VACCINE  05/02/2023   Zoster Vaccines- Shingrix (2 of 2) 05/05/2023   MAMMOGRAM  06/12/2023   Medicare Annual Wellness (AWV)  03/24/2024   DEXA SCAN  08/29/2024   DTaP/Tdap/Td (2 - Td or Tdap) 03/09/2033   Pneumonia Vaccine 19+ Years old  Completed   Hepatitis C Screening  Completed   HPV VACCINES  Aged Out   Colonoscopy  Discontinued    Health Maintenance  Health Maintenance Due  Topic Date Due   COVID-19 Vaccine (1) Never done   Lung Cancer Screening  02/08/2017    Colorectal cancer screening: No longer required.   Mammogram status: No longer required due to age .  Bone Density status: Completed 08/29/2022. Results reflect: Bone density results: OSTEOPOROSIS. Repeat every 2 years.  Lung Cancer Screening: (Low Dose CT Chest recommended if Age 9-80 years, 20 pack-year currently smoking OR have quit w/in 15years.) does qualify.   Lung Cancer Screening Referral: referral 11/12/2022  Additional Screening:  Hepatitis C Screening: does not qualify; Completed 04/18/2021  Vision Screening: Recommended annual ophthalmology exams for early detection of glaucoma and other disorders of the eye. Is the patient up to date with their annual eye exam?  No  Who is the provider or what is the name of the office in which the patient attends annual eye exams? Referral 03/25/2023 If pt is not established with a provider, would they like to be referred to a provider to establish care? No .    Dental Screening: Recommended annual dental exams for proper oral hygiene  Diabetic Foot Exam: Diabetic Foot Exam: Overdue, Pt has been advised about the importance in completing this exam. Pt is scheduled for diabetic foot exam on next office visit .  Community Resource Referral / Chronic Care Management: CRR required this visit?  No   CCM required this visit?  No     Plan:     I have personally reviewed and noted the following in the patient's chart:   Medical and social history Use of alcohol, tobacco or illicit drugs  Current medications and supplements including opioid prescriptions. Patient is not currently taking opioid prescriptions. Functional ability and status Nutritional status Physical activity Advanced directives List of other physicians Hospitalizations, surgeries, and ER visits in previous 12 months Vitals  Screenings to include cognitive, depression, and falls Referrals and appointments  In addition, I have reviewed and discussed with patient certain preventive protocols, quality metrics, and best practice recommendations. A written personalized care plan for preventive services as well as general preventive health recommendations were provided to patient.     Lorrene Reid, LPN   [id number]   After Visit Summary: (MyChart) Due to this being a telephonic visit, the after visit summary with patients personalized plan was offered to patient via MyChart   Nurse Notes: none

## 2023-03-25 NOTE — Telephone Encounter (Signed)
Pt dropped off immunization papers for provider. Paper work its at front at [employer].

## 2023-03-29 NOTE — Telephone Encounter (Signed)
Immunizations recorded in Patient's medical record.

## 2023-04-03 DIAGNOSIS — R809 Proteinuria, unspecified: Secondary | ICD-10-CM | POA: Diagnosis not present

## 2023-04-03 DIAGNOSIS — I129 Hypertensive chronic kidney disease with stage 1 through stage 4 chronic kidney disease, or unspecified chronic kidney disease: Secondary | ICD-10-CM | POA: Diagnosis not present

## 2023-04-03 DIAGNOSIS — N2581 Secondary hyperparathyroidism of renal origin: Secondary | ICD-10-CM | POA: Diagnosis not present

## 2023-04-03 DIAGNOSIS — N1330 Unspecified hydronephrosis: Secondary | ICD-10-CM | POA: Diagnosis not present

## 2023-04-03 DIAGNOSIS — I10 Essential (primary) hypertension: Secondary | ICD-10-CM | POA: Diagnosis not present

## 2023-04-09 DIAGNOSIS — D631 Anemia in chronic kidney disease: Secondary | ICD-10-CM | POA: Diagnosis not present

## 2023-04-09 DIAGNOSIS — N1330 Unspecified hydronephrosis: Secondary | ICD-10-CM | POA: Diagnosis not present

## 2023-04-09 DIAGNOSIS — E875 Hyperkalemia: Secondary | ICD-10-CM | POA: Diagnosis not present

## 2023-04-09 DIAGNOSIS — R809 Proteinuria, unspecified: Secondary | ICD-10-CM | POA: Diagnosis not present

## 2023-04-09 DIAGNOSIS — N281 Cyst of kidney, acquired: Secondary | ICD-10-CM | POA: Diagnosis not present

## 2023-04-09 DIAGNOSIS — N1831 Chronic kidney disease, stage 3a: Secondary | ICD-10-CM | POA: Diagnosis not present

## 2023-04-09 DIAGNOSIS — I10 Essential (primary) hypertension: Secondary | ICD-10-CM | POA: Diagnosis not present

## 2023-04-15 ENCOUNTER — Other Ambulatory Visit: Payer: Self-pay | Admitting: Family Medicine

## 2023-04-15 DIAGNOSIS — Z760 Encounter for issue of repeat prescription: Secondary | ICD-10-CM

## 2023-04-21 ENCOUNTER — Other Ambulatory Visit: Payer: Self-pay | Admitting: Family Medicine

## 2023-04-25 ENCOUNTER — Other Ambulatory Visit: Payer: Self-pay | Admitting: Family Medicine

## 2023-04-25 DIAGNOSIS — I10 Essential (primary) hypertension: Secondary | ICD-10-CM

## 2023-05-08 ENCOUNTER — Ambulatory Visit: Payer: PPO | Admitting: Urology

## 2023-05-10 ENCOUNTER — Other Ambulatory Visit: Payer: Self-pay

## 2023-05-10 DIAGNOSIS — Z1231 Encounter for screening mammogram for malignant neoplasm of breast: Secondary | ICD-10-CM

## 2023-05-13 ENCOUNTER — Ambulatory Visit: Payer: PPO | Admitting: Family Medicine

## 2023-05-17 ENCOUNTER — Other Ambulatory Visit: Payer: Self-pay | Admitting: Family Medicine

## 2023-05-17 DIAGNOSIS — F32A Depression, unspecified: Secondary | ICD-10-CM

## 2023-05-24 ENCOUNTER — Encounter: Payer: Self-pay | Admitting: Family Medicine

## 2023-05-24 ENCOUNTER — Ambulatory Visit (INDEPENDENT_AMBULATORY_CARE_PROVIDER_SITE_OTHER): Payer: PPO | Admitting: Family Medicine

## 2023-05-24 VITALS — BP 114/66 | HR 87 | Temp 98.0°F | Ht 63.0 in | Wt 146.6 lb

## 2023-05-24 DIAGNOSIS — J449 Chronic obstructive pulmonary disease, unspecified: Secondary | ICD-10-CM | POA: Diagnosis not present

## 2023-05-24 DIAGNOSIS — I10 Essential (primary) hypertension: Secondary | ICD-10-CM

## 2023-05-24 LAB — BASIC METABOLIC PANEL
BUN: 50 mg/dL — ABNORMAL HIGH (ref 6–23)
CO2: 28 mEq/L (ref 19–32)
Calcium: 9.7 mg/dL (ref 8.4–10.5)
Chloride: 103 mEq/L (ref 96–112)
Creatinine, Ser: 1.58 mg/dL — ABNORMAL HIGH (ref 0.40–1.20)
GFR: 31.31 mL/min — ABNORMAL LOW (ref >60.00)
Glucose, Bld: 105 mg/dL — ABNORMAL HIGH (ref 70–99)
Potassium: 5 mEq/L (ref 3.5–5.1)
Sodium: 139 mEq/L (ref 135–145)

## 2023-05-24 NOTE — Assessment & Plan Note (Signed)
Chronic issue.  Well-controlled.  Patient's potassium was elevated in early July with nephrology.  Will recheck BMP today.  For now she will continue lisinopril 40 mg daily though we did discuss if her potassium remains elevated we may need to change this to something different.

## 2023-05-24 NOTE — Progress Notes (Signed)
Marikay Alar, MD Phone: [phone]  Tammy Choi is a 77 y.o. female who presents today for f/u.  HYPERTENSION Disease Monitoring Home BP Monitoring similar when she checks Chest pain- no    Dyspnea- chronic and stable Medications Compliance-  taking lisinopril.   Edema- no BMET    Component Value Date/Time   NA 138 12/11/2022 0958   K 5.1 12/11/2022 0958   CL 101 12/11/2022 0958   CO2 25 12/11/2022 0958   GLUCOSE 101 (H) 12/11/2022 0958   BUN 38 (H) 12/11/2022 0958   CREATININE 1.63 (H) 12/11/2022 0958   CREATININE 0.91 08/01/2018 1458   CALCIUM 9.9 12/11/2022 0958   GFRNONAA 32 (L) 12/11/2022 0958   GFRAA 41 (L) 05/30/2020 1304   COPD: Medication compliance- using symbicort as prescribed  Rescue inhaler use- no Dyspnea- stable  Wheezing- minimal  Cough- no     Social History   Tobacco Use  Smoking Status Former   Current packs/day: 0.00   Average packs/day: 1 pack/day for 35.0 years (35.0 ttl pk-yrs)   Types: Cigarettes   Start date: 12/19/1980   Quit date: 12/20/2015   Years since quitting: 7.4  Smokeless Tobacco Never    Current Outpatient Medications on File Prior to Visit  Medication Sig Dispense Refill   anastrozole (ARIMIDEX) 1 MG tablet Take 1 tablet (1 mg total) by mouth daily. 90 tablet 1   busPIRone (BUSPAR) 5 MG tablet Take 1 tablet by mouth twice daily 180 tablet 1   calcitRIOL (ROCALTROL) 0.25 MCG capsule Take 0.25 mcg by mouth every other day.     Cholecalciferol 25 MCG (1000 UT) tablet Take by mouth.     citalopram (CELEXA) 20 MG tablet Take 1 tablet by mouth once daily 90 tablet 1   lisinopril (ZESTRIL) 40 MG tablet Take 1 tablet by mouth once daily 90 tablet 0   PROAIR HFA 108 (90 Base) MCG/ACT inhaler INHALE 2 PUFFS BY MOUTH EVERY 6 HOURS AS NEEDED FOR WHEEZING OR SHORTNESS OF BREATH (Patient taking differently: Inhale 1-2 puffs into the lungs as needed for wheezing or shortness of breath.) 9 g 0   rosuvastatin (CRESTOR) 5 MG tablet  Take 1 tablet by mouth once daily 90 tablet 3   SYMBICORT 160-4.5 MCG/ACT inhaler Inhale 2 puffs by mouth twice daily 33 g 0   No current facility-administered medications on file prior to visit.     ROS see history of present illness  Objective  Physical Exam Vitals:   05/24/23 1111  BP: 114/66  Pulse: 87  Temp: 98 F (36.7 C)  SpO2: 94%    BP Readings from Last 3 Encounters:  05/24/23 114/66  12/11/22 (!) 106/94  11/12/22 118/70   Wt Readings from Last 3 Encounters:  05/24/23 146 lb 9.6 oz (66.5 kg)  03/25/23 150 lb (68 kg)  12/11/22 149 lb 3.2 oz (67.7 kg)    Physical Exam Constitutional:      General: She is not in acute distress.    Appearance: She is not diaphoretic.  Cardiovascular:     Rate and Rhythm: Normal rate and regular rhythm.     Heart sounds: Normal heart sounds.  Pulmonary:     Effort: Pulmonary effort is normal.     Breath sounds: Normal breath sounds.  Skin:    General: Skin is warm and dry.  Neurological:     Mental Status: She is alert.      Assessment/Plan: Please see individual problem list.  Primary hypertension Assessment &  Plan: Chronic issue.  Well-controlled.  Patient's potassium was elevated in early July with nephrology.  Will recheck BMP today.  For now she will continue lisinopril 40 mg daily though we did discuss if her potassium remains elevated we may need to change this to something different.  Orders: -     Basic metabolic panel  Chronic obstructive pulmonary disease, unspecified COPD type (HCC) Assessment & Plan: Chronic issue.  Well-controlled.  She will continue Symbicort 2 puffs twice daily.  She can use albuterol as needed.     Patient had a question about a bill she received for her Medicare wellness visit.  The front office gave her the number for billing to call.  I suggested she call that and if they are not able to sort this out for her she can let us know.  Return in about 6 months (around  11/24/2023).   Marikay Alar, MD [facility]

## 2023-05-24 NOTE — Assessment & Plan Note (Signed)
Chronic issue.  Well-controlled.  She will continue Symbicort 2 puffs twice daily.  She can use albuterol as needed.

## 2023-05-29 ENCOUNTER — Ambulatory Visit: Payer: PPO | Admitting: Urology

## 2023-05-29 NOTE — Progress Notes (Signed)
05/30/2023 1:49 PM   Tammy Choi Oct 06, 1945 [id number]  Referring provider: Lorain Childes, MD [address] [place],  [place] [address]  Urological history: 1. Extrarenal pelvis -CTU (2020) - Prominent extrarenal pelvis noted in each kidney  2. Bilateral renal cysts -RUS (08/2022) - 2. 2 cm right renal simple cyst. 1.0 cm left renal simple cyst.   Chief Complaint  Patient presents with   Hydronephrosis   HPI: Tammy Choi is a 77 y.o. female who presents today for hydronephrosis with her son, Trey Paula.   Previous records reviewed.   She was seen by nephrology in July and it was noted that her GFR was found to be 31.31 on 05/24/2023.  Her last RUS was performed in 08/2022 and it noted increased echo density of both kidneys bilateral renal cortical irregularities and prominent bilateral extrarenal pelvises again and a mild left hydronephrosis that could not be completely excluded and to simple cyst.  No interim changes from prior studies.  She is feeling well.  She denies any flank pain.  She denies any issues with urination.  Patient denies any modifying or aggravating factors.  Patient denies any recent UTI's, gross hematuria, dysuria or suprapubic/flank pain.  Patient denies any fevers, chills, nausea or vomiting.   UA yellow clear, specific gravity 1.020, pH 5.5, WBC 0-5, RBC 0-2 and 0-10 epithelial cells.  PVR 0 mL   PMH: Past Medical History:  Diagnosis Date   Arthritis    Breast cancer (HCC) 05/07/2018   T1a, N0; ER/ PR positive, Her 2 neu not overexpressed.    COPD (chronic obstructive pulmonary disease) (HCC)    Not on home o2   Depression    HTN (hypertension)    Hyperlipemia    Osteoporosis 07/28/2020   Personal history of radiation therapy     Surgical History: Past Surgical History:  Procedure Laterality Date   ABDOMINAL HYSTERECTOMY     BREAST BIOPSY Left 04/30/2013   neg core   BREAST BIOPSY Right 05/07/2018   right  breast stereo x clip INVASIVE MAMMARY CARCINOMA   BREAST LUMPECTOMY Right 2019   BREAST LUMPECTOMY WITH SENTINEL LYMPH NODE BIOPSY Right 06/16/2018   Procedure: BREAST LUMPECTOMY WITH SENTINEL LYMPH NODE BX;  Surgeon: Earline Mayotte, MD;  Location: [facility] ORS;  Service: General;  Laterality: Right;   COLONOSCOPY  2014   Dr Lemar Livings   Left Leg Surgery     TONSILLECTOMY     TOTAL HIP ARTHROPLASTY Right 03/29/2020   Procedure: TOTAL HIP ARTHROPLASTY;  Surgeon: Christena Flake, MD;  Location: [facility] ORS;  Service: Orthopedics;  Laterality: Right;    Home Medications:  Allergies as of 05/30/2023       Reactions   Penicillins Anaphylaxis, Other (See Comments)   Has patient had a PCN reaction causing immediate rash, facial/tongue/throat swelling, SOB or lightheadedness with hypotension: Yes Has patient had a PCN reaction causing severe rash involving mucus membranes or skin necrosis: No Has patient had a PCN reaction that required hospitalization No Has patient had a PCN reaction occurring within the last 10 years: No If all of the above answers are "NO", then may proceed with Cephalosporin use.        Medication List        Accurate as of May 30, 2023  1:49 PM. If you have any questions, ask your nurse or doctor.          anastrozole 1 MG tablet Commonly known as: ARIMIDEX  Take 1 tablet (1 mg total) by mouth daily.   busPIRone 5 MG tablet Commonly known as: BUSPAR Take 1 tablet by mouth twice daily   calcitRIOL 0.25 MCG capsule Commonly known as: ROCALTROL Take 0.25 mcg by mouth every other day.   Cholecalciferol 25 MCG (1000 UT) tablet Take by mouth.   citalopram 20 MG tablet Commonly known as: CELEXA Take 1 tablet by mouth once daily   lisinopril 40 MG tablet Commonly known as: ZESTRIL Take 1 tablet by mouth once daily   ProAir HFA 108 (90 Base) MCG/ACT inhaler Generic drug: albuterol INHALE 2 PUFFS BY MOUTH EVERY 6 HOURS AS NEEDED FOR WHEEZING OR SHORTNESS OF  BREATH What changed: See the new instructions.   rosuvastatin 5 MG tablet Commonly known as: CRESTOR Take 1 tablet by mouth once daily   Symbicort 160-4.5 MCG/ACT inhaler Generic drug: budesonide-formoterol Inhale 2 puffs by mouth twice daily        Allergies:  Allergies  Allergen Reactions   Penicillins Anaphylaxis and Other (See Comments)    Has patient had a PCN reaction causing immediate rash, facial/tongue/throat swelling, SOB or lightheadedness with hypotension: Yes Has patient had a PCN reaction causing severe rash involving mucus membranes or skin necrosis: No Has patient had a PCN reaction that required hospitalization No Has patient had a PCN reaction occurring within the last 10 years: No If all of the above answers are "NO", then may proceed with Cephalosporin use.    Family History: Family History  Problem Relation Age of Onset   Kidney disease Mother        deceased 35   Heart disease Father        deceased 66   Breast cancer Other 40       maternal half-sister; deceased 91   Colon cancer Neg Hx     Social History:  reports that she quit smoking about 7 years ago. Her smoking use included cigarettes. She started smoking about 42 years ago. She has a 35 pack-year smoking history. She has never used smokeless tobacco. She reports that she does not drink alcohol and does not use drugs.  ROS: Pertinent ROS in HPI  Physical Exam: BP 118/69   Pulse 90   Wt 139 lb (63 kg)   BMI 24.62 kg/m   Constitutional:  Well nourished. Alert and oriented, No acute distress. HEENT: [place] AT, moist mucus membranes.  Trachea midline, no masses. Cardiovascular: No clubbing, cyanosis, or edema. Respiratory: Normal respiratory effort, no increased work of breathing. Neurologic: Grossly intact, no focal deficits, moving all 4 extremities. Psychiatric: Normal mood and affect.  Laboratory Data: Lab Results  Component Value Date   WBC 7.5 12/11/2022   HGB 13.0 12/11/2022    HCT 41.4 12/11/2022   MCV 90.8 12/11/2022   PLT 229 12/11/2022    Lab Results  Component Value Date   CREATININE 1.58 (H) 05/24/2023    Lab Results  Component Value Date   HGBA1C 6.2 11/12/2022      Component Value Date/Time   CHOL 144 11/12/2022 1105   HDL 66.90 11/12/2022 1105   CHOLHDL 2 11/12/2022 1105   VLDL 13.4 11/12/2022 1105   LDLCALC 63 11/12/2022 1105   LDLCALC 70 03/14/2018 1459    Lab Results  Component Value Date   AST 19 12/11/2022   Lab Results  Component Value Date   ALT 15 12/11/2022    Urinalysis Results for orders placed or performed in visit on 05/30/23  Microscopic Examination  Urine  Result Value Ref Range   WBC, UA 0-5 0 - 5 /hpf   RBC, Urine 0-2 0 - 2 /hpf   Epithelial Cells (non renal) 0-10 0 - 10 /hpf   Bacteria, UA None seen None seen/Few  Urinalysis, Complete  Result Value Ref Range   Specific Gravity, UA 1.020 1.005 - 1.030   pH, UA 5.5 5.0 - 7.5   Color, UA Yellow Yellow   Appearance Ur Clear Clear   Leukocytes,UA Negative Negative   Protein,UA Negative Negative/Trace   Glucose, UA Negative Negative   Ketones, UA Negative Negative   RBC, UA Negative Negative   Bilirubin, UA Negative Negative   Urobilinogen, Ur 0.2 0.2 - 1.0 mg/dL   Nitrite, UA Negative Negative   Microscopic Examination See below:   Bladder Scan (Post Void Residual) in office  Result Value Ref Range   Scan Result 0ml      I have reviewed the labs.   Pertinent Imaging: CLINICAL DATA:  Chronic renal disease.   EXAM: RENAL / URINARY TRACT ULTRASOUND COMPLETE   COMPARISON:  Ultrasound 07/05/2020.  CT 07/06/2019.   FINDINGS: Right Kidney:   Renal measurements: 11.5 x 3.8 x 4.5 cm = volume: 103.1 mL. Increased echogenicity again noted. This is consistent chronic medical renal disease. Renal cortical irregularity suggesting scarring. 2.0 cm simple cyst. Prominent extrarenal pelvis again noted. No hydronephrosis visualized.   Left Kidney:    Renal measurements: 9.8 x 4.1 x 3.7 cm = volume: 77.1 mL. Increased echogenicity again noted. This is consistent chronic medical renal disease. Renal cortical irregularity suggesting scarring. Prominent left extrarenal pelvis again noted. Mild hydronephrosis cannot be completely excluded. No interim change from prior studies. 1.0 cm simple cyst.   Bladder:   Appears normal for degree of bladder distention.   Other:   None.   IMPRESSION: 1. Increased echogenicity of both kidneys again noted consistent chronic medical renal disease. Bilateral renal cortical irregularity suggesting scarring. 2. Prominent bilateral extrarenal pelvis again noted. Mild left hydronephrosis cannot be completely excluded. No interim change from prior studies. 3. 2. 2 cm right renal simple cyst. 1.0 cm left renal simple cyst.     Electronically Signed   By: Maisie Fus  Register M.D.   On: 08/07/2022 06:53  CLINICAL DATA:  Elevated creatinine, history breast cancer, COPD, hypertension, COPD, former smoker   EXAM: RENAL / URINARY TRACT ULTRASOUND COMPLETE   COMPARISON:  02/25/2019   Correlation: CT abdomen and pelvis 07/06/2019   FINDINGS: Right Kidney:   Renal measurements: 10.3 x 4.1 x 4.8 cm = volume: 105 mL. Slight cortical thinning. Increased cortical echogenicity. Tiny hypoechoic nodule at upper pole 12 x 9 x 9 mm, patient with a 9 mm cyst at the upper pole on a prior CT. No additional mass. Slightly prominent extrarenal pelvis. No calyceal dilatation. No shadowing calcification.   Left Kidney:   Renal measurements: 11.1 x 4.1 x 3.5 cm = volume: 83 mL. Cortical thinning. Increased cortical echogenicity. 2 tiny cysts are identified, 7 x 5 x 7 mm subcapsular at upper pole and 9 x 9 x 8 mm at mid kidney. Mild hydronephrosis. No additional renal masses or shadowing calcification.   Bladder:   Appears normal for degree of bladder distention. BILATERAL ureteral jets visualized. Small  postvoid residual volume of 12 mL.   Other:   N/A   IMPRESSION: Mild LEFT hydronephrosis not significantly changed from prior CT.   Probable medical renal disease changes of both kidneys.   Tiny  BILATERAL renal cysts.   Small postvoid bladder residual volume.     Electronically Signed   By: Ulyses Southward M.D.   On: 07/05/2020 16:29  CLINICAL DATA:  Left hydronephrosis.   EXAM: CT ABDOMEN AND PELVIS WITHOUT AND WITH CONTRAST   TECHNIQUE: Multidetector CT imaging of the abdomen and pelvis was performed following the standard protocol before and following the bolus administration of intravenous contrast.   CONTRAST:  OMNIPAQUE IOHEXOL 300 MG/ML  SOLN   COMPARISON:  03/26/2019   FINDINGS: Lower chest: 4 mm posterior right lower lobe nodule on 03/03 is unchanged since 02/09/2016 consistent with benign etiology. No pleural effusion.   Hepatobiliary: No suspicious focal abnormality within the liver parenchyma. There is no evidence for gallstones, gallbladder wall thickening, or pericholecystic fluid. No intrahepatic or extrahepatic biliary dilation.   Pancreas: No focal mass lesion. No dilatation of the main duct. No intraparenchymal cyst. No peripancreatic edema.   Spleen: No splenomegaly. No focal mass lesion.   Adrenals/Urinary Tract: No adrenal nodule or mass.   Precontrast imaging shows no stones in either kidney or ureter no bladder stones.   Imaging after IV contrast material shows no suspicious enhancing lesion in either kidney. Both kidneys have tiny low-density cortical lesions measuring up to a maximum size of about 9 mm and while too small to characterize, these are likely benign and probably cysts.   Delayed imaging shows a prominent extrarenal pelvis in both kidneys with mild fullness of the left intrarenal collecting system, more so than seen on the right. There is no soft tissue filling defect or wall thickening in either intrarenal  collecting system or renal pelvis. Neither ureter is completely opacified, but there is no evidence for focal hydroureter or mass lesion along the length of either ureter. No focal bladder wall abnormality.   Stomach/Bowel: Small hiatal hernia. Stomach otherwise unremarkable. Duodenum is normally positioned as is the ligament of Treitz. No small bowel wall thickening. No small bowel dilatation. The terminal ileum is normal. The appendix is not visualized, but there is no edema or inflammation in the region of the cecum. No gross colonic mass. No colonic wall thickening. Diverticular changes are noted in the left colon without evidence of diverticulitis.   Vascular/Lymphatic: There is abdominal aortic atherosclerosis without aneurysm. There is no gastrohepatic or hepatoduodenal ligament lymphadenopathy. No intraperitoneal or retroperitoneal lymphadenopathy. No pelvic sidewall lymphadenopathy.   Reproductive: The uterus is surgically absent. There is no adnexal mass.   Other: No intraperitoneal free fluid.   Musculoskeletal: No worrisome lytic or sclerotic osseous abnormality. Degenerative changes are noted in the right hip.   IMPRESSION: 1. Prominent extrarenal pelvis noted in each kidney, similar to 03/26/2019. There is some associated mild caliceal fullness in the left kidney. No hydroureter evident on either side. No evidence for wall thickening or filling defect in either intrarenal collecting system or renal pelvis. No ureteral or bladder abnormality. No evidence for urinary stone disease. 2.     Electronically Signed   By: Kennith Center M.D.   On: 07/06/2019 16:10      05/30/23 13:03  Scan Result 0ml  I have independently reviewed the films.    Assessment & Plan:    1. Left hydronephrosis -has not increased when compared to previous images  -she denies any flank pain or gross heme -we will continue to monitor with yearly RUS or earlier if worsening CKD, flank  pain or hematuria  2. CKD IIIb -Advised her to increase her water  intake -We discussed that she could go on the Internet and look up recipes for infused water as she finds water on appetizing -Encouraged her to add small amounts of water to her diet and slow increments so that her body can become used to increase amount of water and she can develop a good habit -We will recheck her BMP in 3 months   Return in about 3 months (around 08/30/2023) for BMP only .  These notes generated with voice recognition software. I apologize for typographical errors.  Cloretta Ned  [facility] [address]  [address] [place], [place] [address] [phone]

## 2023-05-30 ENCOUNTER — Ambulatory Visit: Payer: PPO | Admitting: Urology

## 2023-05-30 ENCOUNTER — Encounter: Payer: Self-pay | Admitting: Urology

## 2023-05-30 VITALS — BP 118/69 | HR 90 | Wt 139.0 lb

## 2023-05-30 DIAGNOSIS — N1330 Unspecified hydronephrosis: Secondary | ICD-10-CM

## 2023-05-30 DIAGNOSIS — N1832 Chronic kidney disease, stage 3b: Secondary | ICD-10-CM

## 2023-05-30 LAB — BLADDER SCAN AMB NON-IMAGING

## 2023-05-30 NOTE — Patient Instructions (Signed)
Recipe for Cucumber infused water:  Ingredients 8 cups (2L) cold water, divided 2/3 English cucumber (9 ounces;170g), thinly sliced crosswise into rounds, divided (see note) 1/2 cup packed fresh mint leaves (1/2 ounce;14g), divided

## 2023-05-31 LAB — URINALYSIS, COMPLETE
Bilirubin, UA: NEGATIVE
Glucose, UA: NEGATIVE
Ketones, UA: NEGATIVE
Leukocytes,UA: NEGATIVE
Nitrite, UA: NEGATIVE
Protein,UA: NEGATIVE
RBC, UA: NEGATIVE
Specific Gravity, UA: 1.02 (ref 1.005–1.030)
Urobilinogen, Ur: 0.2 mg/dL (ref 0.2–1.0)
pH, UA: 5.5 (ref 5.0–7.5)

## 2023-05-31 LAB — MICROSCOPIC EXAMINATION: Bacteria, UA: NONE SEEN

## 2023-06-07 ENCOUNTER — Other Ambulatory Visit: Payer: Self-pay | Admitting: Family Medicine

## 2023-06-07 DIAGNOSIS — F411 Generalized anxiety disorder: Secondary | ICD-10-CM

## 2023-06-13 ENCOUNTER — Inpatient Hospital Stay (HOSPITAL_BASED_OUTPATIENT_CLINIC_OR_DEPARTMENT_OTHER): Payer: PPO | Admitting: Oncology

## 2023-06-13 ENCOUNTER — Inpatient Hospital Stay: Payer: PPO

## 2023-06-13 ENCOUNTER — Inpatient Hospital Stay: Payer: PPO | Attending: Oncology

## 2023-06-13 ENCOUNTER — Encounter: Payer: Self-pay | Admitting: Oncology

## 2023-06-13 VITALS — BP 140/71 | HR 76 | Temp 96.3°F | Resp 18 | Wt 147.6 lb

## 2023-06-13 DIAGNOSIS — C50311 Malignant neoplasm of lower-inner quadrant of right female breast: Secondary | ICD-10-CM

## 2023-06-13 DIAGNOSIS — M818 Other osteoporosis without current pathological fracture: Secondary | ICD-10-CM

## 2023-06-13 DIAGNOSIS — Z79899 Other long term (current) drug therapy: Secondary | ICD-10-CM | POA: Diagnosis not present

## 2023-06-13 DIAGNOSIS — Z170 Estrogen receptor positive status [ER+]: Secondary | ICD-10-CM

## 2023-06-13 DIAGNOSIS — N1832 Chronic kidney disease, stage 3b: Secondary | ICD-10-CM

## 2023-06-13 DIAGNOSIS — M810 Age-related osteoporosis without current pathological fracture: Secondary | ICD-10-CM | POA: Insufficient documentation

## 2023-06-13 LAB — CMP (CANCER CENTER ONLY)
ALT: 15 U/L (ref 0–44)
AST: 19 U/L (ref 15–41)
Albumin: 4.2 g/dL (ref 3.5–5.0)
Alkaline Phosphatase: 38 U/L (ref 38–126)
Anion gap: 7 (ref 5–15)
BUN: 45 mg/dL — ABNORMAL HIGH (ref 8–23)
CO2: 25 mmol/L (ref 22–32)
Calcium: 9 mg/dL (ref 8.9–10.3)
Chloride: 103 mmol/L (ref 98–111)
Creatinine: 1.48 mg/dL — ABNORMAL HIGH (ref 0.44–1.00)
GFR, Estimated: 36 mL/min — ABNORMAL LOW (ref >60)
Glucose, Bld: 96 mg/dL (ref 70–99)
Potassium: 5 mmol/L (ref 3.5–5.1)
Sodium: 135 mmol/L (ref 135–145)
Total Bilirubin: 0.4 mg/dL (ref 0.3–1.2)
Total Protein: 7.2 g/dL (ref 6.5–8.1)

## 2023-06-13 LAB — CBC WITH DIFFERENTIAL (CANCER CENTER ONLY)
Abs Immature Granulocytes: 0.02 10*3/uL (ref 0.00–0.07)
Basophils Absolute: 0 10*3/uL (ref 0.0–0.1)
Basophils Relative: 1 %
Eosinophils Absolute: 0.1 10*3/uL (ref 0.0–0.5)
Eosinophils Relative: 2 %
HCT: 39.2 % (ref 36.0–46.0)
Hemoglobin: 12.1 g/dL (ref 12.0–15.0)
Immature Granulocytes: 0 %
Lymphocytes Relative: 42 %
Lymphs Abs: 2.8 10*3/uL (ref 0.7–4.0)
MCH: 28.1 pg (ref 26.0–34.0)
MCHC: 30.9 g/dL (ref 30.0–36.0)
MCV: 91 fL (ref 80.0–100.0)
Monocytes Absolute: 0.5 10*3/uL (ref 0.1–1.0)
Monocytes Relative: 7 %
Neutro Abs: 3.2 10*3/uL (ref 1.7–7.7)
Neutrophils Relative %: 48 %
Platelet Count: 219 10*3/uL (ref 150–400)
RBC: 4.31 MIL/uL (ref 3.87–5.11)
RDW: 14.4 % (ref 11.5–15.5)
WBC Count: 6.7 10*3/uL (ref 4.0–10.5)
nRBC: 0 % (ref 0.0–0.2)

## 2023-06-13 MED ORDER — DENOSUMAB 60 MG/ML ~~LOC~~ SOSY
60.0000 mg | PREFILLED_SYRINGE | Freq: Once | SUBCUTANEOUS | Status: AC
  Administered 2023-06-13: 60 mg via SUBCUTANEOUS
  Filled 2023-06-13: qty 1

## 2023-06-13 MED ORDER — ANASTROZOLE 1 MG PO TABS: 1.0000 mg | ORAL_TABLET | Freq: Every day | ORAL | 0 refills | Status: DC

## 2023-06-13 NOTE — Assessment & Plan Note (Addendum)
 Stage IA breast cancer ER positive, PR negative, HER-2 negative. Clinically she is doing well.  Labs are reviewed and discussed with patient. Continue Arimidex - plan 5 year till Jan 2025 if she tolerates Annual mammogram, due in Sept 2024, she get mammogram ordered through surgeon's office.

## 2023-06-13 NOTE — Assessment & Plan Note (Signed)
she has full mouth dentures.   DEXA in Sept 2023 showed osteoporosis Continue calcium and vitamin D supplementation.  Proceed with Prolia today.

## 2023-06-13 NOTE — Assessment & Plan Note (Signed)
Encourage oral hydration and avoid nephrotoxins.   

## 2023-06-13 NOTE — Progress Notes (Signed)
Hematology/Oncology Progress note Telephone:([phone]) [phone] Fax:(336) [phone]   REASON FOR VISIT:  Follow up for breast cancer  ASSESSMENT & PLAN:   Cancer Staging  Malignant neoplasm of lower-inner quadrant of right breast of female, estrogen receptor positive (HCC) Staging form: Breast, AJCC 8th Edition - Pathologic stage from 09/29/2018: Stage IA (pT1a, pN0, cM0, G2, ER+, PR-, HER2-) - Signed by Rickard Patience, MD on 09/29/2018   Malignant neoplasm of lower-inner quadrant of right breast of female, estrogen receptor positive (HCC) Stage IA breast cancer ER positive, PR negative, HER-2 negative. Clinically she is doing well.  Labs are reviewed and discussed with patient. Continue Arimidex- plan 5 year till Jan 2025 if she tolerates Annual mammogram, due in Sept 2024, she get mammogram ordered through surgeon's office.   Osteoporosis she has full mouth dentures.   DEXA in Sept 2023 showed osteoporosis Continue calcium and vitamin D supplementation.  Proceed with Prolia today.   CKD stage 3b, GFR 30-44 ml/min (HCC) Encourage oral hydration and avoid nephrotoxins.    Orders Placed This Encounter  Procedures   CMP (Cancer Center only)    Standing Status:   Future    Standing Expiration Date:   06/12/2024   CBC with Differential (Cancer Center Only)    Standing Status:   Future    Standing Expiration Date:   06/12/2024   Basic Metabolic Panel - Cancer Center Only    Standing Status:   Future    Standing Expiration Date:   06/12/2024   Follow up in 6 months lab + prolia  12 months lab MD + prolia All questions were answered. The patient knows to call the clinic with any problems, questions or concerns.  Rickard Patience, MD, PhD [facility] Hematology Oncology 06/13/2023   HISTORY OF PRESENTING ILLNESS:  Tammy Choi is a  77 y.o.  female with PMH listed below who was referred to me for evaluation of breast cancer. Patient had mammogram 05/01/2018 which showed indeterminate  microcalcification in the medial right breast measuring 0.9 x 0.7 x 0.7 cm Biopsy pathology showed: Invasive mammary carcinoma, DCIS, calcification associated with DCIS, grade 2, ER positive PR negative, HER-2 negative (IHC 1+)  Nipple discharge: Denies Family history: Sister passed away from breast cancer History of radiation to chest: denies.  Previous breast surgery: Left breast biopsy with benign etiology. . 06/16/2018, right breast lumpectomy and sentinel lymph node biopsy showed residual invasive mammary carcinoma, residual ductal carcinoma in situ, biopsy site changes and metallic clip.  Incidental 5 mm fibroadenoma.  All 3 sentinel lymph node negative. ER positive HER2 negative.  Patient underwent adjuvant radiation finished in January 2020. Started aromatase inhibitor with Arimidex 1 mg daily since 1/20/ 2020.  # she followed up with urology Dr. Richardo Hanks for bilateral hydronephrosis. Patient was recommended to have CT urogram done for evaluation of filling defects with monitoring kidney function versus more invasive investigation with cystoscopy, bilateral retrograde pyelograms and possible ureteroscopy possible biopsy.she choose to have a image surveillance with monitoring of kidney function.  # Arimidex  Since Jan 2020.  #Chronic bilateral hydronephrosis, she has had work up with Urology and no obstruction has been found.  INTERVAL HISTORY Tammy Choi is a 77 y.o. female who has above history reviewed by me today presents for follow up visit for management of Stage IA right breast cancer, ER+ HER2 - She has no new complaints.  Patient takes Arimidex and tolerates well.   Review of Systems  Constitutional:  Negative for chills,  fever, malaise/fatigue and weight loss.  HENT:  Negative for nosebleeds and sore throat.   Eyes:  Negative for double vision, photophobia and redness.  Respiratory:  Negative for cough, shortness of breath and wheezing.   Cardiovascular:  Negative for  chest pain, palpitations, orthopnea and leg swelling.  Gastrointestinal:  Negative for abdominal pain, blood in stool, nausea and vomiting.  Genitourinary:  Negative for dysuria.  Musculoskeletal:  Negative for back pain, myalgias and neck pain.       Right hip replacement.   Skin:  Negative for itching and rash.  Neurological:  Negative for dizziness, tingling and tremors.  Endo/Heme/Allergies:  Negative for environmental allergies. Does not bruise/bleed easily.  Psychiatric/Behavioral:  Negative for depression and hallucinations. The patient is not nervous/anxious.     MEDICAL HISTORY:  Past Medical History:  Diagnosis Date   Arthritis    Breast cancer (HCC) 05/07/2018   T1a, N0; ER/ PR positive, Her 2 neu not overexpressed.    COPD (chronic obstructive pulmonary disease) (HCC)    Not on home o2   Depression    HTN (hypertension)    Hyperlipemia    Osteoporosis 07/28/2020   Personal history of radiation therapy     SURGICAL HISTORY: Past Surgical History:  Procedure Laterality Date   ABDOMINAL HYSTERECTOMY     BREAST BIOPSY Left 04/30/2013   neg core   BREAST BIOPSY Right 05/07/2018   right breast stereo x clip INVASIVE MAMMARY CARCINOMA   BREAST LUMPECTOMY Right 2019   BREAST LUMPECTOMY WITH SENTINEL LYMPH NODE BIOPSY Right 06/16/2018   Procedure: BREAST LUMPECTOMY WITH SENTINEL LYMPH NODE BX;  Surgeon: Earline Mayotte, MD;  Location: [facility] ORS;  Service: General;  Laterality: Right;   COLONOSCOPY  2014   Dr Lemar Livings   Left Leg Surgery     TONSILLECTOMY     TOTAL HIP ARTHROPLASTY Right 03/29/2020   Procedure: TOTAL HIP ARTHROPLASTY;  Surgeon: Christena Flake, MD;  Location: [facility] ORS;  Service: Orthopedics;  Laterality: Right;    SOCIAL HISTORY: Social History   Socioeconomic History   Marital status: Divorced    Spouse name: Not on file   Number of children: Not on file   Years of education: Not on file   Highest education level: Not on file  Occupational History    Occupation: Retired Landscape architect: retired  Tobacco Use   Smoking status: Former    Current packs/day: 0.00    Average packs/day: 1 pack/day for 35.0 years (35.0 ttl pk-yrs)    Types: Cigarettes    Start date: 12/19/1980    Quit date: 12/20/2015    Years since quitting: 7.4   Smokeless tobacco: Never  Vaping Use   Vaping status: Never Used  Substance and Sexual Activity   Alcohol use: No    Alcohol/week: 0.0 standard drinks of alcohol   Drug use: No   Sexual activity: Not on file  Other Topics Concern   Not on file  Social History Narrative   Lives with daughter at home. Independent at baseline.   Social Determinants of Health   Financial Resource Strain: Low Risk  (03/25/2023)   Overall Financial Resource Strain (CARDIA)    Difficulty of Paying Living Expenses: Not hard at all  Food Insecurity: No Food Insecurity (03/25/2023)   Hunger Vital Sign    Worried About Running Out of Food in the Last Year: Never true    Ran Out of Food in the Last Year: Never true  Transportation Needs: No Transportation Needs (03/25/2023)   PRAPARE - Administrator, Civil Service (Medical): No    Lack of Transportation (Non-Medical): No  Physical Activity: Sufficiently Active (03/25/2023)   Exercise Vital Sign    Days of Exercise per Week: 5 days    Minutes of Exercise per Session: 30 min  Stress: No Stress Concern Present (03/25/2023)   [employer] of Occupational Health - Occupational Stress Questionnaire    Feeling of Stress : Not at all  Social Connections: Moderately Isolated (03/25/2023)   Social Connection and Isolation Panel [NHANES]    Frequency of Communication with Friends and Family: More than three times a week    Frequency of Social Gatherings with Friends and Family: More than three times a week    Attends Religious Services: More than 4 times per year    Active Member of [employer] or Organizations: No    Attends Banker Meetings: Never     Marital Status: Divorced  Catering manager Violence: Not At Risk (03/25/2023)   Humiliation, Afraid, Rape, and Kick questionnaire    Fear of Current or Ex-Partner: No    Emotionally Abused: No    Physically Abused: No    Sexually Abused: No    FAMILY HISTORY: Family History  Problem Relation Age of Onset   Kidney disease Mother        deceased 21   Heart disease Father        deceased 25   Breast cancer Other 40       maternal half-sister; deceased 49   Colon cancer Neg Hx     ALLERGIES:  is allergic to penicillins.  MEDICATIONS:  Current Outpatient Medications  Medication Sig Dispense Refill   busPIRone (BUSPAR) 5 MG tablet Take 1 tablet by mouth twice daily 180 tablet 1   calcitRIOL (ROCALTROL) 0.25 MCG capsule Take 0.25 mcg by mouth every other day.     Cholecalciferol 25 MCG (1000 UT) tablet Take by mouth.     citalopram (CELEXA) 20 MG tablet Take 1 tablet by mouth once daily 90 tablet 1   lisinopril (ZESTRIL) 40 MG tablet Take 1 tablet by mouth once daily 90 tablet 0   PROAIR HFA 108 (90 Base) MCG/ACT inhaler INHALE 2 PUFFS BY MOUTH EVERY 6 HOURS AS NEEDED FOR WHEEZING OR SHORTNESS OF BREATH (Patient taking differently: Inhale 1-2 puffs into the lungs as needed for wheezing or shortness of breath.) 9 g 0   rosuvastatin (CRESTOR) 5 MG tablet Take 1 tablet by mouth once daily 90 tablet 3   SYMBICORT 160-4.5 MCG/ACT inhaler Inhale 2 puffs by mouth twice daily 33 g 0   anastrozole (ARIMIDEX) 1 MG tablet Take 1 tablet (1 mg total) by mouth daily. 120 tablet 0   No current facility-administered medications for this visit.     PHYSICAL EXAMINATION:  Vitals:   06/13/23 1004  BP: (!) 140/71  Pulse: 76  Resp: 18  Temp: (!) 96.3 F (35.7 C)   Filed Weights   06/13/23 1004  Weight: 147 lb 9.6 oz (67 kg)    Physical Exam Constitutional:      General: She is not in acute distress. HENT:     Head: Normocephalic and atraumatic.  Eyes:     General: No scleral  icterus.    Pupils: Pupils are equal, round, and reactive to light.  Cardiovascular:     Rate and Rhythm: Normal rate and regular rhythm.     Heart sounds:  Normal heart sounds.  Pulmonary:     Effort: Pulmonary effort is normal. No respiratory distress.  Abdominal:     General: Bowel sounds are normal. There is no distension.     Palpations: Abdomen is soft. There is no mass.     Tenderness: There is no abdominal tenderness.  Musculoskeletal:        General: No deformity. Normal range of motion.     Cervical back: Normal range of motion and neck supple.  Skin:    General: Skin is warm and dry.     Findings: No erythema or rash.  Neurological:     Mental Status: She is alert and oriented to person, place, and time.     Cranial Nerves: No cranial nerve deficit.     Coordination: Coordination normal.  Psychiatric:        Behavior: Behavior normal.        Thought Content: Thought content normal.         LABORATORY DATA:  I have reviewed the data as listed    Latest Ref Rng & Units 06/13/2023    9:54 AM 12/11/2022    9:58 AM 05/14/2022   12:39 PM  CBC  WBC 4.0 - 10.5 K/uL 6.7  7.5  7.5   Hemoglobin 12.0 - 15.0 g/dL 16.1  09.6  04.5   Hematocrit 36.0 - 46.0 % 39.2  41.4  40.6   Platelets 150 - 400 K/uL 219  229  217       Latest Ref Rng & Units 06/13/2023    9:54 AM 05/24/2023   11:32 AM 12/11/2022    9:58 AM  CMP  Glucose 70 - 99 mg/dL 96  409  811   BUN 8 - 23 mg/dL 45  50  38   Creatinine 0.44 - 1.00 mg/dL 9.14  7.82  9.56   Sodium 135 - 145 mmol/L 135  139  138   Potassium 3.5 - 5.1 mmol/L 5.0  5.0  5.1   Chloride 98 - 111 mmol/L 103  103  101   CO2 22 - 32 mmol/L 25  28  25    Calcium 8.9 - 10.3 mg/dL 9.0  9.7  9.9   Total Protein 6.5 - 8.1 g/dL 7.2   7.8   Total Bilirubin 0.3 - 1.2 mg/dL 0.4   0.4   Alkaline Phos 38 - 126 U/L 38   46   AST 15 - 41 U/L 19   19   ALT 0 - 44 U/L 15   15       RADIOGRAPHIC STUDIES: I have personally reviewed the radiological  images as listed and agreed with the findings in the report.

## 2023-06-20 ENCOUNTER — Ambulatory Visit: Payer: PPO | Admitting: Nurse Practitioner

## 2023-06-25 ENCOUNTER — Ambulatory Visit
Admission: RE | Admit: 2023-06-25 | Discharge: 2023-06-25 | Disposition: A | Discharge status: Home / Self Care | Payer: PPO | Source: Ambulatory Visit | Attending: Surgery | Admitting: Surgery

## 2023-06-25 DIAGNOSIS — Z1231 Encounter for screening mammogram for malignant neoplasm of breast: Secondary | ICD-10-CM | POA: Insufficient documentation

## 2023-07-01 ENCOUNTER — Encounter: Payer: Self-pay | Admitting: Surgery

## 2023-07-01 ENCOUNTER — Ambulatory Visit: Payer: PPO | Admitting: Surgery

## 2023-07-01 VITALS — BP 152/82 | HR 76 | Temp 98.5°F | Ht 62.0 in | Wt 147.8 lb

## 2023-07-01 DIAGNOSIS — Z853 Personal history of malignant neoplasm of breast: Secondary | ICD-10-CM | POA: Diagnosis not present

## 2023-07-01 DIAGNOSIS — Z08 Encounter for follow-up examination after completed treatment for malignant neoplasm: Secondary | ICD-10-CM | POA: Diagnosis not present

## 2023-07-01 NOTE — Patient Instructions (Signed)
Breast Self-Awareness Breast self-awareness means being familiar with how your breasts look and feel. It involves checking your breasts regularly and telling your health care provider about any changes. Practicing breast self-awareness helps to maintain breast health. Sometimes, changes are not harmful (are benign). Other times, a change in your breasts can be a sign of a serious medical problem. Being familiar with the look and feel of your breasts can help you catch a breast problem while it is still small and can be treated. You should do breast self-exams even if you have breast implants. What you need: A mirror. A well-lit room. A pillow or other soft object. How to do a breast self-exam A breast self-exam is one way to learn what is normal for your breasts and whether your breasts are changing. To do a breast self-exam: Look for changes  Remove all the clothing above your waist. Stand in front of a mirror in a room with good lighting. Put your hands down at your sides. Compare your breasts in the mirror. Look for differences between them (asymmetry), such as: Differences in shape. Differences in size. Puckers, dips, and bumps in one breast and not the other. Look at each breast for changes in the skin, such as: Redness. Scaly areas. Skin thickening. Dimpling. Open sores (ulcers). Look for changes in your nipples, such as: Discharge. Bleeding. Dimpling. Redness. A nipple that looks pushed in (retracted), or that has changed position. Feel for changes Carefully feel your breasts for lumps and changes. It is best to do this self-exam while lying down. Follow these steps to feel each breast: Place a pillow under the shoulder of one side of your body. Place the arm of that side of your body behind your head. Feel the breast of that side of your body using the hand of the opposite arm. To do this: Start in the nipple area and use the pads of your three middle fingers to make -inch  (2 cm) overlapping circles. Use light, medium, and then firm pressure as you feel your breast, gently covering the entire breast area and armpit. Continue the overlapping circles, moving downward over the breast until you feel your ribs below your breast. Then, make circles with your fingers going upward until you reach your collarbone. Next, make circles by moving outward across your breast and into your armpit area. Squeeze the nipple. Check for discharge and lumps. Repeat steps 1-7 to check your other breast. Sit or stand in the tub or shower. With soapy water on your skin, feel each breast the same way you did when you were lying down. Write down what you find Writing down what you find can help you remember what to discuss with your health care provider. Write down: What is normal for each breast. Any changes that you find in each breast. These include: The kind of changes you find. Any pain or tenderness. Size and location of any lumps. Where you are in your menstrual cycle, if you are still getting your menstrual period (menstruating). General tips If you are breastfeeding, the best time to examine your breasts is after a feeding or after using a breast pump. If you menstruate, the best time to examine your breasts is 5-7 days after your menstrual period. Breasts are generally lumpier during menstrual periods, and it may be more difficult to notice changes. With time and practice, you will become more familiar with the differences in your breasts and more comfortable with the exam. Contact a health care  provider if: You see a change in the shape or size of your breasts or nipples. You see a change in the skin of your breast or nipples, such as a reddened or scaly area. You have unusual discharge from your nipples. You find a new lump or thick area. You have breast pain. You have any concerns about your breast health. Summary Breast self-awareness includes looking for physical  changes in your breasts and feeling for any changes within your breasts. Breast self-awareness should be done in front of a mirror in a well-lit room. If you menstruate, the best time to examine your breasts is 5-7 days after your menstrual period. Tell your health care provider about any changes you notice in your breasts. Changes include changes in size, changes on the skin, pain or tenderness, or unusual fluid from your nipples. This information is not intended to replace advice given to you by your health care provider. Make sure you discuss any questions you have with your health care provider. Document Revised: 02/22/2022 Document Reviewed: 07/20/2021 Elsevier Patient Education  2024 [employer].

## 2023-07-03 ENCOUNTER — Encounter: Payer: Self-pay | Admitting: Surgery

## 2023-07-03 NOTE — Progress Notes (Signed)
Outpatient Surgical Follow Up  Tammy Choi is an 77 y.o. female.   Chief Complaint  Patient presents with   Follow-up    1 yr mammo 06/19/23    HPI: Tammy Choi is a 77 year old female with a history of right breast cancer Invasive mammary ER + PR - HER Neg status post lumpectomy and radiation therapy in 2019 by Dr. Lemar Choi. underwent XRT.  She denies any breast lumps.  No breast discharge.  No fevers no chills no weight loss.  she did have a recent mammo that I have personally reviewed showing no evidence of concerning lesions She dos have CKD but not on HD   Past Medical History:  Diagnosis Date   Arthritis    Breast cancer (HCC) 05/07/2018   T1a, N0; ER/ PR positive, Her 2 neu not overexpressed.    COPD (chronic obstructive pulmonary disease) (HCC)    Not on home o2   Depression    HTN (hypertension)    Hyperlipemia    Osteoporosis 07/28/2020   Personal history of radiation therapy     Past Surgical History:  Procedure Laterality Date   ABDOMINAL HYSTERECTOMY     BREAST BIOPSY Left 04/30/2013   neg core   BREAST BIOPSY Right 05/07/2018   right breast stereo x clip INVASIVE MAMMARY CARCINOMA   BREAST LUMPECTOMY Right 2019   BREAST LUMPECTOMY WITH SENTINEL LYMPH NODE BIOPSY Right 06/16/2018   Procedure: BREAST LUMPECTOMY WITH SENTINEL LYMPH NODE BX;  Surgeon: Tammy Mayotte, MD;  Location: [facility] ORS;  Service: General;  Laterality: Right;   COLONOSCOPY  2014   Dr Tammy Choi   Left Leg Surgery     TONSILLECTOMY     TOTAL HIP ARTHROPLASTY Right 03/29/2020   Procedure: TOTAL HIP ARTHROPLASTY;  Surgeon: Tammy Flake, MD;  Location: [facility] ORS;  Service: Orthopedics;  Laterality: Right;    Family History  Problem Relation Age of Onset   Kidney disease Mother        deceased 19   Heart disease Father        deceased 52   Breast cancer Other 10       maternal half-sister; deceased 65   Colon cancer Neg Hx     Social History:  reports that she quit smoking about 7  years ago. Her smoking use included cigarettes. She started smoking about 42 years ago. She has a 35 pack-year smoking history. She has never used smokeless tobacco. She reports that she does not drink alcohol and does not use drugs.  Allergies:  Allergies  Allergen Reactions   Penicillins Anaphylaxis and Other (See Comments)    Has patient had a PCN reaction causing immediate rash, facial/tongue/throat swelling, SOB or lightheadedness with hypotension: Yes Has patient had a PCN reaction causing severe rash involving mucus membranes or skin necrosis: No Has patient had a PCN reaction that required hospitalization No Has patient had a PCN reaction occurring within the last 10 years: No If all of the above answers are "NO", then may proceed with Cephalosporin use.    Medications reviewed.    ROS Full ROS performed and is otherwise negative other than what is stated in HPI   BP (!) 152/82 (BP Location: Left Arm, Patient Position: Sitting, Cuff Size: Small)   Pulse 76   Temp 98.5 F (36.9 C) (Oral)   Ht 5\' 2"  (1.575 m)   Wt 147 lb 12.8 oz (67 kg)   SpO2 92%   BMI 27.03 kg/m  Physical Exam Physical Exam Vitals and nursing note reviewed. Exam conducted with a chaperone present.  Constitutional:      General: She is not in acute distress.    Appearance: Normal appearance. She is normal weight.  Eyes:     General: No scleral icterus.       Right eye: No discharge.        Left eye: No discharge.  Neck:     Vascular: No carotid bruit.  Cardiovascular:     Rate and Rhythm: Normal rate and regular rhythm.  Pulmonary:     Effort: Pulmonary effort is normal. No respiratory distress.     Breath sounds: Normal breath sounds. No stridor.     Comments: BREAST: Right lumpectomy scar and sentinel lymph node biopsy scar.  There is no evidence of new masses there is no evidence of skin ulcerations.  There are no evidence of axillary lymphadenopathy.   Abdominal:     General: Abdomen is  flat. There is no distension.     Palpations: Abdomen is soft. There is no mass.     Tenderness: There is no abdominal tenderness. There is no right CVA tenderness, guarding or rebound.     Hernia: No hernia is present.  Musculoskeletal:     Cervical back: Normal range of motion and neck supple. No rigidity or tenderness.  Lymphadenopathy:     Cervical: No cervical adenopathy.  neurological:     General: No focal deficit present.     Mental Status: She is alert and oriented to person, place, and time.  Psychiatric:        Mood and Affect: Mood normal.        Behavior: Behavior normal.        Thought Content: Thought content normal.        Judgment: Judgment normal.     Assessment/Plan: 77 year old female history of Invasive breast cancer without evidence of recurrence.  No need for further test or biopsies at this time.  We will continue yearly f/u per her wishes. Please note that I have spent greater than  30 minutes  in this visit including personally reviewing imaging studies, counseling/coordination of care and performing appropriate documentation  Sterling Big, MD [facility] General Surgeon

## 2023-07-24 ENCOUNTER — Other Ambulatory Visit: Payer: Self-pay | Admitting: Family Medicine

## 2023-07-24 NOTE — Telephone Encounter (Signed)
Pt would like to be called regarding her SYMBICORT

## 2023-07-24 NOTE — Telephone Encounter (Signed)
Patient can not afford 3 months at a time at $200 so she is requesting to only get 1 month at a time of the Symbicort and she needs a refill now.

## 2023-07-25 MED ORDER — SYMBICORT 160-4.5 MCG/ACT IN AERO: 2.0000 | INHALATION_SPRAY | Freq: Two times a day (BID) | RESPIRATORY_TRACT | 0 refills | Status: DC

## 2023-07-26 ENCOUNTER — Other Ambulatory Visit: Payer: Self-pay | Admitting: Family Medicine

## 2023-07-26 DIAGNOSIS — I10 Essential (primary) hypertension: Secondary | ICD-10-CM

## 2023-07-26 NOTE — Telephone Encounter (Signed)
Refill was sent in by P. Hyman Hopes

## 2023-08-02 ENCOUNTER — Encounter: Payer: Self-pay | Admitting: Family Medicine

## 2023-08-02 ENCOUNTER — Ambulatory Visit (INDEPENDENT_AMBULATORY_CARE_PROVIDER_SITE_OTHER): Payer: PPO | Admitting: Family Medicine

## 2023-08-02 VITALS — BP 120/68 | HR 87 | Temp 97.8°F | Ht 62.0 in | Wt 148.8 lb

## 2023-08-02 DIAGNOSIS — Z23 Encounter for immunization: Secondary | ICD-10-CM | POA: Diagnosis not present

## 2023-08-02 DIAGNOSIS — N1832 Chronic kidney disease, stage 3b: Secondary | ICD-10-CM

## 2023-08-02 DIAGNOSIS — F419 Anxiety disorder, unspecified: Secondary | ICD-10-CM | POA: Diagnosis not present

## 2023-08-02 DIAGNOSIS — E538 Deficiency of other specified B group vitamins: Secondary | ICD-10-CM

## 2023-08-02 DIAGNOSIS — M79672 Pain in left foot: Secondary | ICD-10-CM | POA: Diagnosis not present

## 2023-08-02 DIAGNOSIS — E785 Hyperlipidemia, unspecified: Secondary | ICD-10-CM

## 2023-08-02 DIAGNOSIS — F32A Depression, unspecified: Secondary | ICD-10-CM | POA: Diagnosis not present

## 2023-08-02 DIAGNOSIS — M79662 Pain in left lower leg: Secondary | ICD-10-CM | POA: Insufficient documentation

## 2023-08-02 MED ORDER — CYANOCOBALAMIN 1000 MCG/ML IJ SOLN
1000.0000 ug | Freq: Once | INTRAMUSCULAR | Status: AC
Start: 2023-08-02 — End: 2023-08-02
  Administered 2023-08-02: 1000 ug via INTRAMUSCULAR

## 2023-08-02 NOTE — Assessment & Plan Note (Signed)
Chronic issue.  Generally stable.  Discussed risk factor control.  Continue to monitor kidney function.

## 2023-08-02 NOTE — Progress Notes (Signed)
Marikay Alar, MD Phone: [phone]  Tammy Choi is a 77 y.o. female who presents today for f/u.  Anxiety/depression: Patient notes her anxiety is better.  She notes no depression.  She is been taking buspirone 1-2 times a day.  She is also on Celexa.  No SI.  Hyperlipidemia: Taking Crestor.  No chest pain, claudication, or right upper quadrant pain.    Left foot swelling: Patient notes swelling in the dorsum of her left foot recently.  She notes some discomfort if she moves her foot a certain way.  She does note she was squatting in the garden recently though she notes no injury to her foot.  Social History   Tobacco Use  Smoking Status Former   Current packs/day: 0.00   Average packs/day: 1 pack/day for 35.0 years (35.0 ttl pk-yrs)   Types: Cigarettes   Start date: 12/19/1980   Quit date: 12/20/2015   Years since quitting: 7.6  Smokeless Tobacco Never    Current Outpatient Medications on File Prior to Visit  Medication Sig Dispense Refill   anastrozole (ARIMIDEX) 1 MG tablet Take 1 tablet (1 mg total) by mouth daily. 120 tablet 0   busPIRone (BUSPAR) 5 MG tablet Take 1 tablet by mouth twice daily 180 tablet 1   calcitRIOL (ROCALTROL) 0.25 MCG capsule Take 0.25 mcg by mouth every other day.     Cholecalciferol 25 MCG (1000 UT) tablet Take by mouth.     citalopram (CELEXA) 20 MG tablet Take 1 tablet by mouth once daily 90 tablet 1   lisinopril (ZESTRIL) 40 MG tablet Take 1 tablet by mouth once daily 90 tablet 0   PROAIR HFA 108 (90 Base) MCG/ACT inhaler INHALE 2 PUFFS BY MOUTH EVERY 6 HOURS AS NEEDED FOR WHEEZING OR SHORTNESS OF BREATH (Patient taking differently: Inhale 1-2 puffs into the lungs as needed for wheezing or shortness of breath.) 9 g 0   rosuvastatin (CRESTOR) 5 MG tablet Take 1 tablet by mouth once daily 90 tablet 3   SYMBICORT 160-4.5 MCG/ACT inhaler Inhale 2 puffs into the lungs 2 (two) times daily. 33 g 0   No current facility-administered medications  on file prior to visit.     ROS see history of present illness  Objective  Physical Exam Vitals:   08/02/23 1133  BP: 120/68  Pulse: 87  Temp: 97.8 F (36.6 C)  SpO2: 94%    BP Readings from Last 3 Encounters:  08/02/23 120/68  07/01/23 (!) 152/82  06/13/23 (!) 140/71   Wt Readings from Last 3 Encounters:  08/02/23 148 lb 12.8 oz (67.5 kg)  07/01/23 147 lb 12.8 oz (67 kg)  06/13/23 147 lb 9.6 oz (67 kg)    Physical Exam Constitutional:      General: She is not in acute distress.    Appearance: She is not diaphoretic.  Cardiovascular:     Rate and Rhythm: Normal rate and regular rhythm.     Heart sounds: Normal heart sounds.  Pulmonary:     Effort: Pulmonary effort is normal.     Breath sounds: Normal breath sounds.  Musculoskeletal:       Feet:  Skin:    General: Skin is warm and dry.  Neurological:     Mental Status: She is alert.      Assessment/Plan: Please see individual problem list.  Anxiety and depression Assessment & Plan: Chronic issue.  Adequately controlled.  She will continue Celexa 20 mg daily.  I encouraged her to take her  buspirone consistently 5 mg twice daily.   Encounter for administration of vaccine -     Flu Vaccine Trivalent High Dose (Fluad)  B12 deficiency -     Cyanocobalamin  Hyperlipidemia, unspecified hyperlipidemia type Assessment & Plan: Chronic issue.  Continue Crestor 5 mg daily.   CKD stage 3b, GFR 30-44 ml/min (HCC) Assessment & Plan: Chronic issue.  Generally stable.  Discussed risk factor control.  Continue to monitor kidney function.   Left foot pain Assessment & Plan: Patient with some dorsal left foot discomfort and swelling.  Suspect related to soft tissue injury.  She will ice the area for 10 minutes 2-3 times daily.  If not improving she will let us know.     Return in about 1 month (around 09/01/2023) for b12 nurse visit.   Marikay Alar, MD [facility]

## 2023-08-02 NOTE — Assessment & Plan Note (Signed)
Chronic issue.  Adequately controlled.  She will continue Celexa 20 mg daily.  I encouraged her to take her buspirone consistently 5 mg twice daily.

## 2023-08-02 NOTE — Assessment & Plan Note (Signed)
Chronic issue.  Continue Crestor 5 mg daily.

## 2023-08-02 NOTE — Assessment & Plan Note (Signed)
Patient with some dorsal left foot discomfort and swelling.  Suspect related to soft tissue injury.  She will ice the area for 10 minutes 2-3 times daily.  If not improving she will let us know.

## 2023-08-08 DIAGNOSIS — R609 Edema, unspecified: Secondary | ICD-10-CM | POA: Diagnosis not present

## 2023-08-08 DIAGNOSIS — N2581 Secondary hyperparathyroidism of renal origin: Secondary | ICD-10-CM | POA: Diagnosis not present

## 2023-08-08 DIAGNOSIS — I10 Essential (primary) hypertension: Secondary | ICD-10-CM | POA: Diagnosis not present

## 2023-08-08 DIAGNOSIS — N1832 Chronic kidney disease, stage 3b: Secondary | ICD-10-CM | POA: Diagnosis not present

## 2023-08-13 DIAGNOSIS — E875 Hyperkalemia: Secondary | ICD-10-CM | POA: Diagnosis not present

## 2023-08-13 DIAGNOSIS — N281 Cyst of kidney, acquired: Secondary | ICD-10-CM | POA: Diagnosis not present

## 2023-08-13 DIAGNOSIS — N1831 Chronic kidney disease, stage 3a: Secondary | ICD-10-CM | POA: Diagnosis not present

## 2023-08-13 DIAGNOSIS — I10 Essential (primary) hypertension: Secondary | ICD-10-CM | POA: Diagnosis not present

## 2023-08-13 DIAGNOSIS — N1330 Unspecified hydronephrosis: Secondary | ICD-10-CM | POA: Diagnosis not present

## 2023-08-13 DIAGNOSIS — R809 Proteinuria, unspecified: Secondary | ICD-10-CM | POA: Diagnosis not present

## 2023-08-13 DIAGNOSIS — D631 Anemia in chronic kidney disease: Secondary | ICD-10-CM | POA: Diagnosis not present

## 2023-08-28 ENCOUNTER — Other Ambulatory Visit: Payer: PPO

## 2023-08-30 ENCOUNTER — Other Ambulatory Visit: Payer: PPO

## 2023-08-30 ENCOUNTER — Ambulatory Visit: Payer: PPO

## 2023-09-02 ENCOUNTER — Ambulatory Visit (INDEPENDENT_AMBULATORY_CARE_PROVIDER_SITE_OTHER): Payer: PPO

## 2023-09-02 DIAGNOSIS — E538 Deficiency of other specified B group vitamins: Secondary | ICD-10-CM | POA: Diagnosis not present

## 2023-09-02 MED ORDER — CYANOCOBALAMIN 1000 MCG/ML IJ SOLN
1000.0000 ug | Freq: Once | INTRAMUSCULAR | Status: AC
Start: 2023-09-02 — End: 2023-09-02
  Administered 2023-09-02: 1000 ug via INTRAMUSCULAR

## 2023-09-02 NOTE — Progress Notes (Signed)
Pt presented for their vitamin B12 injection. Pt was identified through two identifiers. Pt tolerated shot well in their left  deltoid.  

## 2023-09-04 ENCOUNTER — Other Ambulatory Visit: Payer: PPO

## 2023-09-04 DIAGNOSIS — N1832 Chronic kidney disease, stage 3b: Secondary | ICD-10-CM | POA: Diagnosis not present

## 2023-09-04 DIAGNOSIS — N1330 Unspecified hydronephrosis: Secondary | ICD-10-CM

## 2023-09-05 ENCOUNTER — Other Ambulatory Visit: Payer: Self-pay

## 2023-09-05 ENCOUNTER — Encounter: Payer: Self-pay | Admitting: Emergency Medicine

## 2023-09-05 ENCOUNTER — Telehealth: Payer: Self-pay | Admitting: Urology

## 2023-09-05 ENCOUNTER — Ambulatory Visit: Payer: PPO | Admitting: Urology

## 2023-09-05 ENCOUNTER — Emergency Department
Admission: EM | Admit: 2023-09-05 | Discharge: 2023-09-05 | Disposition: A | Discharge status: Home / Self Care | Payer: PPO | Attending: Student in an Organized Health Care Education/Training Program | Admitting: Student in an Organized Health Care Education/Training Program

## 2023-09-05 DIAGNOSIS — E875 Hyperkalemia: Secondary | ICD-10-CM | POA: Insufficient documentation

## 2023-09-05 DIAGNOSIS — R7989 Other specified abnormal findings of blood chemistry: Secondary | ICD-10-CM | POA: Diagnosis present

## 2023-09-05 LAB — BASIC METABOLIC PANEL
Anion gap: 10 (ref 5–15)
BUN/Creatinine Ratio: 37 — ABNORMAL HIGH (ref 12–28)
BUN: 79 mg/dL (ref 8–27)
BUN: 66 mg/dL — ABNORMAL HIGH (ref 8–23)
CO2: 26 mmol/L (ref 20–29)
CO2: 27 mmol/L (ref 22–32)
Calcium: 10.5 mg/dL — ABNORMAL HIGH (ref 8.7–10.3)
Calcium: 9.5 mg/dL (ref 8.9–10.3)
Chloride: 105 mmol/L (ref 96–106)
Chloride: 104 mmol/L (ref 98–111)
Creatinine, Ser: 2.13 mg/dL — ABNORMAL HIGH (ref 0.57–1.00)
Creatinine, Ser: 1.81 mg/dL — ABNORMAL HIGH (ref 0.44–1.00)
GFR, Estimated: 28 mL/min — ABNORMAL LOW (ref >60)
Glucose, Bld: 104 mg/dL — ABNORMAL HIGH (ref 70–99)
Glucose: 99 mg/dL (ref 70–99)
Potassium: 6.3 mmol/L — ABNORMAL HIGH (ref 3.5–5.2)
Potassium: 5.2 mmol/L — ABNORMAL HIGH (ref 3.5–5.1)
Sodium: 144 mmol/L (ref 134–144)
Sodium: 141 mmol/L (ref 135–145)
eGFR: 23 mL/min/{1.73_m2} — ABNORMAL LOW (ref >59)

## 2023-09-05 LAB — CBC
HCT: 39.8 % (ref 36.0–46.0)
Hemoglobin: 12.4 g/dL (ref 12.0–15.0)
MCH: 27.4 pg (ref 26.0–34.0)
MCHC: 31.2 g/dL (ref 30.0–36.0)
MCV: 88.1 fL (ref 80.0–100.0)
Platelets: 218 10*3/uL (ref 150–400)
RBC: 4.52 MIL/uL (ref 3.87–5.11)
RDW: 15 % (ref 11.5–15.5)
WBC: 6.3 10*3/uL (ref 4.0–10.5)
nRBC: 0 % (ref 0.0–0.2)

## 2023-09-05 MED ORDER — PATIROMER SORBITEX CALCIUM 8.4 G PO PACK
8.4000 g | PACK | Freq: Every day | ORAL | Status: DC
  Administered 2023-09-05: 8.4 g via ORAL
  Filled 2023-09-05: qty 1

## 2023-09-05 NOTE — ED Provider Notes (Signed)
[facility] Provider Note    Event Date/Time   First MD Initiated Contact with Patient 09/05/23 1336     (approximate)   History   Abnormal Lab   HPI  Tammy Choi is a 77 y.o. female who presents to the ER for evaluation of abnormal blood work from outpatient clinic yesterday.  Was seen in urology clinic for urinary frequency management renal cyst was found to have mild AKI as well as hyperkalemia greater than 6.  States that she has had normal appetite feels otherwise perfectly fine and normal today.  Repeat blood work showed significant improvement.     Physical Exam   Triage Vital Signs: ED Triage Vitals  Encounter Vitals Group     BP 09/05/23 1236 (!) 152/72     Systolic BP Percentile --      Diastolic BP Percentile --      Pulse Rate 09/05/23 1236 88     Resp 09/05/23 1236 18     Temp 09/05/23 1236 98.4 F (36.9 C)     Temp src --      SpO2 09/05/23 1236 96 %     Weight 09/05/23 1234 149 lb (67.6 kg)     Height 09/05/23 1234 5\' 3"  (1.6 m)     Head Circumference --      Peak Flow --      Pain Score 09/05/23 1234 0     Pain Loc --      Pain Education --      Exclude from Growth Chart --     Most recent vital signs: Vitals:   09/05/23 1236  BP: (!) 152/72  Pulse: 88  Resp: 18  Temp: 98.4 F (36.9 C)  SpO2: 96%     Constitutional: Alert  Eyes: Conjunctivae are normal.  Head: Atraumatic. Nose: No congestion/rhinnorhea. Mouth/Throat: Mucous membranes are moist.   Neck: Painless ROM.  Cardiovascular:   Good peripheral circulation. Respiratory: Normal respiratory effort.  No retractions.  Gastrointestinal: Soft and nontender.  Musculoskeletal:  no deformity Neurologic:  MAE spontaneously. No gross focal neurologic deficits are appreciated.  Skin:  Skin is warm, dry and intact. No rash noted. Psychiatric: Mood and affect are normal. Speech and behavior are normal.    ED Results / Procedures / Treatments   Labs (all  labs ordered are listed, but only abnormal results are displayed) Labs Reviewed  BASIC METABOLIC PANEL - Abnormal; Notable for the following components:      Result Value   Potassium 5.2 (*)    Glucose, Bld 104 (*)    BUN 66 (*)    Creatinine, Ser 1.81 (*)    GFR, Estimated 28 (*)    All other components within normal limits  CBC  URINALYSIS, ROUTINE W REFLEX MICROSCOPIC  CBG MONITORING, ED     EKG  ED ECG REPORT I, Willy Eddy, the attending physician, personally viewed and interpreted this ECG.   Date: 09/05/2023  EKG Time: 12:44  Rate: 85  Rhythm: sinus  Axis: left  Intervals:rtbbb  ST&T Change: no stemi, no acute ischemic changes    RADIOLOGY    PROCEDURES:  Critical Care performed:   Procedures   MEDICATIONS ORDERED IN ED: Medications  patiromer Lelon Perla) packet 8.4 g (8.4 g Oral Given 09/05/23 1407)     IMPRESSION / MDM / ASSESSMENT AND PLAN / ED COURSE  I reviewed the triage vital signs and the nursing notes.  Differential diagnosis includes, but is not limited to, AKI, hyperkalemia, dehydration, medication effect  Patient presenting to the ER for evaluation of symptoms as described above.  Based on symptoms, risk factors and considered above differential, this presenting complaint could reflect a potentially life-threatening illness therefore the patient will be placed on continuous pulse oximetry and telemetry for monitoring.  Laboratory evaluation will be sent to evaluate for the above complaints.  Patient's repeat blood work is reassuring.  Patient is nontoxic-appearing well-hydrated tolerating p.o.  Given elevation yesterday will give single dose of Veltassa here in the ER.  She will follow-up with PCP and nephrology regarding chronic medications.       FINAL CLINICAL IMPRESSION(S) / ED DIAGNOSES   Final diagnoses:  Hyperkalemia     Rx / DC Orders   ED Discharge Orders     None        Note:   This document was prepared using Dragon voice recognition software and may include unintentional dictation errors.    Willy Eddy, MD 09/05/23 [phone]

## 2023-09-05 NOTE — ED Triage Notes (Signed)
Patient to ED via POV for abnormal lab. Seen at urology for check up yesterday- BUN 79, Creatinine 2.13, Potassium 6.3. Pt reports feeling more tired than normal.

## 2023-09-05 NOTE — Telephone Encounter (Addendum)
Spoke with Mrs. Tammy Choi this morning notifying her of her lab results and asked her to come in today to be seen.  She states she may have issues with transportation and will call me back.  I explained to her that her kidney function had worsened quite a bit over the last month and we need to evaluate it further.   It was noted the potasium level had also increased from 5.8 to 6.3.  I contacted her and had her go to the ED for further evaluation.  ED is notified.

## 2023-09-06 ENCOUNTER — Telehealth: Payer: Self-pay

## 2023-09-06 NOTE — Transitions of Care (Post Inpatient/ED Visit) (Signed)
   09/06/2023  Name: Tammy Choi MRN: [id number] DOB: May 20, 1946  Today's TOC FU Call Status: Today's TOC FU Call Status:: Successful TOC FU Call Completed TOC FU Call Complete Date: 09/06/23 Patient's Name and Date of Birth confirmed.  Transition Care Management Follow-up Telephone Call Date of Discharge: 09/05/23 Discharge Facility: [facility]) Type of Discharge: Emergency Department Reason for ED Visit: Other: (hyperkalemia) How have you been since you were released from the hospital?: Better Any questions or concerns?: No  Items Reviewed: Did you receive and understand the discharge instructions provided?: Yes Medications obtained,verified, and reconciled?: Yes (Medications Reviewed) Any new allergies since your discharge?: No Dietary orders reviewed?: Yes Do you have support at home?: Yes People in Home: child(ren), adult  Medications Reviewed Today: Medications Reviewed Today     Reviewed by Karena Addison, LPN (Licensed Practical Nurse) on 09/06/23 at [phone]  Med List Status: <None>   Medication Order Taking? Sig Documenting Provider Last Dose Status Informant  anastrozole (ARIMIDEX) 1 MG tablet [id number] No Take 1 tablet (1 mg total) by mouth daily. Rickard Patience, MD Taking Active   busPIRone (BUSPAR) 5 MG tablet [id number] No Take 1 tablet by mouth twice daily Glori Luis, MD Taking Active   calcitRIOL (ROCALTROL) 0.25 MCG capsule [id number] No Take 0.25 mcg by mouth every other day. [provider] Taking Active   Cholecalciferol 25 MCG (1000 UT) tablet [id number] No Take by mouth. [provider] Taking Active   citalopram (CELEXA) 20 MG tablet [id number] No Take 1 tablet by mouth once daily Glori Luis, MD Taking Active   lisinopril (ZESTRIL) 40 MG tablet [id number] No Take 1 tablet by mouth once daily Glori Luis, MD Taking Active   PROAIR HFA 108 [phone] Base) MCG/ACT inhaler [id number] No INHALE 2 PUFFS BY  MOUTH EVERY 6 HOURS AS NEEDED FOR WHEEZING OR SHORTNESS OF BREATH  Patient taking differently: Inhale 1-2 puffs into the lungs as needed for wheezing or shortness of breath.   Glori Luis, MD Taking Active   rosuvastatin (CRESTOR) 5 MG tablet [id number] No Take 1 tablet by mouth once daily Glori Luis, MD Taking Active   SYMBICORT 160-4.5 MCG/ACT inhaler [id number] No Inhale 2 puffs into the lungs 2 (two) times daily. Eulis Foster, FNP Taking Active             Home Care and Equipment/Supplies: Were Home Health Services Ordered?: NA Any new equipment or medical supplies ordered?: NA  Functional Questionnaire: Do you need assistance with bathing/showering or dressing?: No Do you need assistance with meal preparation?: No Do you need assistance with eating?: No Do you have difficulty maintaining continence: No Do you need assistance with getting out of bed/getting out of a chair/moving?: No Do you have difficulty managing or taking your medications?: No  Follow up appointments reviewed: PCP Follow-up appointment confirmed?: No (no avail appt. sent message to staff to schedule) Specialist Hospital Follow-up appointment confirmed?: NA Do you need transportation to your follow-up appointment?: No Do you understand care options if your condition(s) worsen?: Yes-patient verbalized understanding    SIGNATURE Karena Addison, LPN [facility] Nurse Health Advisor Direct Dial [phone]

## 2023-09-06 NOTE — Telephone Encounter (Signed)
Reviewed

## 2023-09-09 ENCOUNTER — Ambulatory Visit (INDEPENDENT_AMBULATORY_CARE_PROVIDER_SITE_OTHER): Payer: PPO | Admitting: Family Medicine

## 2023-09-09 ENCOUNTER — Encounter: Payer: Self-pay | Admitting: Family Medicine

## 2023-09-09 VITALS — BP 120/70 | HR 60 | Temp 98.5°F | Ht 63.0 in | Wt 146.0 lb

## 2023-09-09 DIAGNOSIS — I10 Essential (primary) hypertension: Secondary | ICD-10-CM | POA: Diagnosis not present

## 2023-09-09 DIAGNOSIS — N1832 Chronic kidney disease, stage 3b: Secondary | ICD-10-CM

## 2023-09-09 DIAGNOSIS — J309 Allergic rhinitis, unspecified: Secondary | ICD-10-CM | POA: Diagnosis not present

## 2023-09-09 DIAGNOSIS — N179 Acute kidney failure, unspecified: Secondary | ICD-10-CM

## 2023-09-09 DIAGNOSIS — R5383 Other fatigue: Secondary | ICD-10-CM | POA: Insufficient documentation

## 2023-09-09 LAB — BASIC METABOLIC PANEL
BUN: 50 mg/dL — ABNORMAL HIGH (ref 6–23)
CO2: 25 meq/L (ref 19–32)
Calcium: 9.4 mg/dL (ref 8.4–10.5)
Chloride: 104 meq/L (ref 96–112)
Creatinine, Ser: 1.64 mg/dL — ABNORMAL HIGH (ref 0.40–1.20)
GFR: 29.88 mL/min — ABNORMAL LOW (ref >60.00)
Glucose, Bld: 95 mg/dL (ref 70–99)
Potassium: 4.9 meq/L (ref 3.5–5.1)
Sodium: 138 meq/L (ref 135–145)

## 2023-09-09 LAB — VITAMIN B12: Vitamin B-12: 737 pg/mL (ref 211–911)

## 2023-09-09 LAB — TSH: TSH: 1.68 u[IU]/mL (ref 0.35–5.50)

## 2023-09-09 NOTE — Assessment & Plan Note (Signed)
Ongoing issue.  Possibly related to B12 deficiency.  Recheck B12 today.  Check TSH as well.

## 2023-09-09 NOTE — Patient Instructions (Signed)
Nice to see you. Will get lab work today and contact you with the results. 

## 2023-09-09 NOTE — Assessment & Plan Note (Signed)
Chronic issue.  Suspect her symptoms are related to allergies.  We will treat with Astelin 2 sprays each nostril twice daily.  If she is unable to tolerate this she will let us know.  Discussed the risk of nose irritation and abnormal taste in back of her throat with this medication.

## 2023-09-09 NOTE — Assessment & Plan Note (Signed)
Chronic issue.  Recheck kidney function today.  Avoid NSAIDs.  Encouraged adequate water intake.

## 2023-09-09 NOTE — Progress Notes (Signed)
Tammy Alar, MD Phone: [phone]  Tammy Choi is a 77 y.o. female who presents today for f/u.  Hypertension/hyperkalemia/AKI: Patient was in the emergency department recently with worsening kidney function and hyperkalemia.  Rechecked labs appeared improved.  She noted no symptoms with this.  She notes no chest pain, shortness of breath, or palpitations.  She is not taking NSAIDs.  She notes is not drinking as much water as she should.  She notes her urination is normal.  Fatigue: Patient notes this has been going on for a while.  She wonders if she needs another B12 injection.  She had a B12 injection about a week ago.  Allergic rhinitis: Patient reports some postnasal drip today.  Notes it seems to have settled in her throat.  Notes this occurs periodically.  He does not take any medications for allergies.  She notes no fever or congestion.  Social History   Tobacco Use  Smoking Status Former   Current packs/day: 0.00   Average packs/day: 1 pack/day for 35.0 years (35.0 ttl pk-yrs)   Types: Cigarettes   Start date: 12/19/1980   Quit date: 12/20/2015   Years since quitting: 7.7  Smokeless Tobacco Never    Current Outpatient Medications on File Prior to Visit  Medication Sig Dispense Refill   anastrozole (ARIMIDEX) 1 MG tablet Take 1 tablet (1 mg total) by mouth daily. 120 tablet 0   busPIRone (BUSPAR) 5 MG tablet Take 1 tablet by mouth twice daily 180 tablet 1   calcitRIOL (ROCALTROL) 0.25 MCG capsule Take 0.25 mcg by mouth every other day.     Cholecalciferol 25 MCG (1000 UT) tablet Take by mouth.     citalopram (CELEXA) 20 MG tablet Take 1 tablet by mouth once daily 90 tablet 1   lisinopril (ZESTRIL) 40 MG tablet Take 1 tablet by mouth once daily 90 tablet 0   PROAIR HFA 108 (90 Base) MCG/ACT inhaler INHALE 2 PUFFS BY MOUTH EVERY 6 HOURS AS NEEDED FOR WHEEZING OR SHORTNESS OF BREATH (Patient taking differently: Inhale 1-2 puffs into the lungs as needed for wheezing or  shortness of breath.) 9 g 0   rosuvastatin (CRESTOR) 5 MG tablet Take 1 tablet by mouth once daily 90 tablet 3   SYMBICORT 160-4.5 MCG/ACT inhaler Inhale 2 puffs into the lungs 2 (two) times daily. 33 g 0   torsemide (DEMADEX) 10 MG tablet Take 10 mg by mouth daily.     No current facility-administered medications on file prior to visit.     ROS see history of present illness  Objective  Physical Exam Vitals:   09/09/23 1105  BP: 120/70  Pulse: 60  Temp: 98.5 F (36.9 C)    BP Readings from Last 3 Encounters:  09/09/23 120/70  09/05/23 (!) 152/72  08/02/23 120/68   Wt Readings from Last 3 Encounters:  09/09/23 146 lb (66.2 kg)  09/05/23 149 lb (67.6 kg)  08/02/23 148 lb 12.8 oz (67.5 kg)    Physical Exam Constitutional:      General: She is not in acute distress.    Appearance: She is not diaphoretic.  Cardiovascular:     Rate and Rhythm: Normal rate and regular rhythm.     Heart sounds: Normal heart sounds.  Pulmonary:     Effort: Pulmonary effort is normal.     Breath sounds: Normal breath sounds.  Skin:    General: Skin is warm and dry.  Neurological:     Mental Status: She is alert.  Assessment/Plan: Please see individual problem list.  AKI (acute kidney injury) (HCC)  Primary hypertension Assessment & Plan: Chronic issue.  Well-controlled.  Potassium has been elevated recently.  Discussed the potential for dose reduction of lisinopril versus switching to something different.  Per review of nephrology's note they wanted her to remain on an ACE inhibitor for cardiorenal protection.  For now she will continue lisinopril 40 mg daily and torsemide 10 mg daily.  We will check a BMP today.  Orders: -     Basic metabolic panel  CKD stage 3b, GFR 30-44 ml/min (HCC) Assessment & Plan: Chronic issue.  Recheck kidney function today.  Avoid NSAIDs.  Encouraged adequate water intake.   Other fatigue Assessment & Plan: Ongoing issue.  Possibly related to  B12 deficiency.  Recheck B12 today.  Check TSH as well.  Orders: -     Vitamin B12 -     TSH  Allergic rhinitis, unspecified seasonality, unspecified trigger Assessment & Plan: Chronic issue.  Suspect her symptoms are related to allergies.  We will treat with Astelin 2 sprays each nostril twice daily.  If she is unable to tolerate this she will let us know.  Discussed the risk of nose irritation and abnormal taste in back of her throat with this medication.     Return for As scheduled.   Tammy Alar, MD [facility] Primary Care [facility]

## 2023-09-09 NOTE — Assessment & Plan Note (Signed)
Chronic issue.  Well-controlled.  Potassium has been elevated recently.  Discussed the potential for dose reduction of lisinopril versus switching to something different.  Per review of nephrology's note they wanted her to remain on an ACE inhibitor for cardiorenal protection.  For now she will continue lisinopril 40 mg daily and torsemide 10 mg daily.  We will check a BMP today.

## 2023-10-07 ENCOUNTER — Ambulatory Visit (INDEPENDENT_AMBULATORY_CARE_PROVIDER_SITE_OTHER): Payer: PPO

## 2023-10-07 DIAGNOSIS — E538 Deficiency of other specified B group vitamins: Secondary | ICD-10-CM | POA: Diagnosis not present

## 2023-10-07 MED ORDER — CYANOCOBALAMIN 1000 MCG/ML IJ SOLN
1000.0000 ug | Freq: Once | INTRAMUSCULAR | Status: AC
Start: 2023-10-07 — End: 2023-10-07
  Administered 2023-10-07: 1000 ug via INTRAMUSCULAR

## 2023-10-07 NOTE — Progress Notes (Signed)
 Pt presented for their vitamin B12 injection. Pt was identified through two identifiers. Pt tolerated shot well in their left deltoid.

## 2023-10-10 ENCOUNTER — Other Ambulatory Visit: Payer: Self-pay | Admitting: Emergency Medicine

## 2023-10-10 ENCOUNTER — Ambulatory Visit
Admission: RE | Admit: 2023-10-10 | Discharge: 2023-10-10 | Disposition: A | Discharge status: Home / Self Care | Payer: PPO | Source: Ambulatory Visit | Attending: Emergency Medicine | Admitting: Emergency Medicine

## 2023-10-10 DIAGNOSIS — R079 Chest pain, unspecified: Secondary | ICD-10-CM

## 2023-10-10 DIAGNOSIS — R918 Other nonspecific abnormal finding of lung field: Secondary | ICD-10-CM | POA: Diagnosis not present

## 2023-10-29 ENCOUNTER — Other Ambulatory Visit: Payer: Self-pay | Admitting: Family Medicine

## 2023-10-29 DIAGNOSIS — I10 Essential (primary) hypertension: Secondary | ICD-10-CM

## 2023-11-01 ENCOUNTER — Encounter: Payer: Self-pay | Admitting: Oncology

## 2023-11-04 ENCOUNTER — Ambulatory Visit (INDEPENDENT_AMBULATORY_CARE_PROVIDER_SITE_OTHER): Payer: HMO

## 2023-11-04 DIAGNOSIS — E538 Deficiency of other specified B group vitamins: Secondary | ICD-10-CM | POA: Diagnosis not present

## 2023-11-04 MED ORDER — CYANOCOBALAMIN 1000 MCG/ML IJ SOLN
1000.0000 ug | Freq: Once | INTRAMUSCULAR | Status: AC
Start: 2023-11-04 — End: 2023-11-04
  Administered 2023-11-04: 1000 ug via INTRAMUSCULAR

## 2023-11-04 NOTE — Progress Notes (Signed)
 Pt presented for their vitamin B12 injection. Pt was identified through two identifiers. Pt tolerated shot well in their left deltoid.

## 2023-11-25 ENCOUNTER — Ambulatory Visit: Payer: PPO | Admitting: Family Medicine

## 2023-11-25 ENCOUNTER — Encounter: Payer: Self-pay | Admitting: Family Medicine

## 2023-11-25 ENCOUNTER — Ambulatory Visit (INDEPENDENT_AMBULATORY_CARE_PROVIDER_SITE_OTHER): Payer: HMO | Admitting: Family Medicine

## 2023-11-25 VITALS — BP 114/64 | HR 68 | Temp 98.3°F | Resp 18 | Ht 63.0 in | Wt 143.0 lb

## 2023-11-25 DIAGNOSIS — I10 Essential (primary) hypertension: Secondary | ICD-10-CM | POA: Diagnosis not present

## 2023-11-25 DIAGNOSIS — J449 Chronic obstructive pulmonary disease, unspecified: Secondary | ICD-10-CM | POA: Diagnosis not present

## 2023-11-25 DIAGNOSIS — Z170 Estrogen receptor positive status [ER+]: Secondary | ICD-10-CM

## 2023-11-25 DIAGNOSIS — C50311 Malignant neoplasm of lower-inner quadrant of right female breast: Secondary | ICD-10-CM | POA: Diagnosis not present

## 2023-11-25 DIAGNOSIS — M810 Age-related osteoporosis without current pathological fracture: Secondary | ICD-10-CM | POA: Diagnosis not present

## 2023-11-25 DIAGNOSIS — E785 Hyperlipidemia, unspecified: Secondary | ICD-10-CM

## 2023-11-25 DIAGNOSIS — F411 Generalized anxiety disorder: Secondary | ICD-10-CM

## 2023-11-25 DIAGNOSIS — F39 Unspecified mood [affective] disorder: Secondary | ICD-10-CM

## 2023-11-25 DIAGNOSIS — N1832 Chronic kidney disease, stage 3b: Secondary | ICD-10-CM | POA: Diagnosis not present

## 2023-11-25 MED ORDER — DENOSUMAB 60 MG/ML ~~LOC~~ SOSY
60.0000 mg | PREFILLED_SYRINGE | Freq: Once | SUBCUTANEOUS | Status: AC
Start: 2023-11-25 — End: 2023-11-25

## 2023-11-25 MED ORDER — CITALOPRAM HYDROBROMIDE 20 MG PO TABS
20.0000 mg | ORAL_TABLET | Freq: Every day | ORAL | 3 refills | Status: DC
Start: 2023-11-25 — End: 2024-07-28

## 2023-11-25 NOTE — Patient Instructions (Signed)
 It was a pleasure meeting you today. Thank you for allowing me to take part in your health care.  Our goals for today as we discussed include:  Refill sent for requested medication  Follow up with Nephrology and Oncology as scheduled   This is a list of the screening recommended for you and due dates:  Health Maintenance  Topic Date Due   COVID-19 Vaccine (1) Never done   Screening for Lung Cancer  02/08/2017   Medicare Annual Wellness Visit  03/24/2024   Mammogram  06/24/2024   DEXA scan (bone density measurement)  08/29/2024   DTaP/Tdap/Td vaccine (3 - Td or Tdap) 03/09/2033   Pneumonia Vaccine  Completed   Flu Shot  Completed   Hepatitis C Screening  Completed   Zoster (Shingles) Vaccine  Completed   HPV Vaccine  Aged Out   Colon Cancer Screening  Discontinued      If you have any questions or concerns, please do not hesitate to call the office at [phone].  I look forward to our next visit and until then take care and stay safe.  Regards,   Dana Allan, MD   [facility]

## 2023-11-25 NOTE — Progress Notes (Signed)
 SUBJECTIVE:   Chief Complaint  Patient presents with   Establish Care    Transferring from Dr. Birdie Sons   HPI Presents to clinic to transfer care  Discussed the use of AI scribe software for clinical note transcription with the patient, who gave verbal consent to proceed.  History of Present Illness  Tammy Choi is a 78 year old female with chronic kidney disease and COPD who presents for medication review and management.  She has chronic kidney disease and COPD. She has not experienced any COPD exacerbations in the past couple of years and uses albuterol only for emergencies. She uses Symbicort once daily, although it is prescribed twice daily, and finds her symptoms are controlled with this regimen.  She has been on Arimidex 1 mg daily for nearly five years, prescribed by her oncologist, with regular follow-ups and an appointment scheduled in March. She takes buspirone 5 mg twice daily for anxiety, prescribed by her current provider, and does not require a refill at this time. She is on calcitriol every other day, prescribed by Dr. Reymundo Poll, and takes cholecalciferol as recommended by Dr. Butler Denmark. She is also on Celexa 20 mg daily, which was switched last year from another medication by Dr. Butler Denmark, and she reports it is helping her. She takes lisinopril 40 mg daily and rosuvastatin 5 mg daily.  Recently, she started torsemide, prescribed by her nephrologist, due to low kidney function. She has had one refill and has been on it for a few months. She mentions experiencing headaches recently, which she attributes to torsemide, a known side effect. Her potassium levels have been high, and she believes torsemide was prescribed to manage this.  She receives Prolia injections every six months for osteoporosis, ordered by Dr. Cathie Hoops. No significant swelling, shortness of breath, or chest pain, and her urination has improved with the current medication regimen.  She lives with her daughter  and is originally from the area, living about ten minutes away from the clinic.      PERTINENT PMH / PSH: As above  OBJECTIVE:  BP 114/64   Pulse 68   Temp 98.3 F (36.8 C)   Resp 18   Ht 5\' 3"  (1.6 m)   Wt 143 lb (64.9 kg)   SpO2 93%   BMI 25.33 kg/m    Physical Exam Vitals reviewed.  Constitutional:      General: She is not in acute distress.    Appearance: She is not ill-appearing.  HENT:     Head: Normocephalic.     Right Ear: Tympanic membrane, ear canal and external ear normal.     Left Ear: Tympanic membrane, ear canal and external ear normal.     Nose: Nose normal.     Mouth/Throat:     Mouth: Mucous membranes are moist.  Eyes:     Extraocular Movements: Extraocular movements intact.     Conjunctiva/sclera: Conjunctivae normal.     Pupils: Pupils are equal, round, and reactive to light.  Neck:     Thyroid: No thyromegaly or thyroid tenderness.     Vascular: No carotid bruit.  Cardiovascular:     Rate and Rhythm: Normal rate and regular rhythm.     Pulses: Normal pulses.     Heart sounds: Normal heart sounds.  Pulmonary:     Effort: Pulmonary effort is normal.     Breath sounds: Normal breath sounds.  Abdominal:     General: Bowel sounds are normal. There is no distension.  Palpations: Abdomen is soft.     Tenderness: There is no abdominal tenderness. There is no right CVA tenderness, left CVA tenderness, guarding or rebound.  Musculoskeletal:        General: Normal range of motion.     Cervical back: Normal range of motion.     Right lower leg: No edema.     Left lower leg: No edema.  Lymphadenopathy:     Cervical: No cervical adenopathy.  Skin:    Capillary Refill: Capillary refill takes less than 2 seconds.  Neurological:     General: No focal deficit present.     Mental Status: She is alert and oriented to person, place, and time. Mental status is at baseline.     Motor: No weakness.  Psychiatric:        Mood and Affect: Mood normal.         Behavior: Behavior normal.        Thought Content: Thought content normal.        Judgment: Judgment normal.           11/25/2023   10:07 AM 08/02/2023   11:34 AM 05/24/2023   11:22 AM 03/25/2023   10:46 AM 11/12/2022   10:35 AM  Depression screen PHQ 2/9  Decreased Interest 1 0 0 0 0  Down, Depressed, Hopeless 1 0 0 0 0  PHQ - 2 Score 2 0 0 0 0  Altered sleeping 0 0 0  0  Tired, decreased energy 0 0 0  0  Change in appetite 0 0 0  0  Feeling bad or failure about yourself  0 0 0  0  Trouble concentrating 0 0 0  0  Moving slowly or fidgety/restless 0 0 0  0  Suicidal thoughts 0 0 0  0  PHQ-9 Score 2 0 0  0  Difficult doing work/chores Somewhat difficult Not difficult at all Not difficult at all  Not difficult at all      11/25/2023   10:07 AM 08/02/2023   11:35 AM 05/24/2023   11:22 AM 11/12/2022   10:36 AM  GAD 7 : Generalized Anxiety Score  Nervous, Anxious, on Edge 0 0 0 0  Control/stop worrying 0 0 0 0  Worry too much - different things 0 0 0 0  Trouble relaxing 0 0 0 0  Restless 0 0 0 0  Easily annoyed or irritable 0 0 0 0  Afraid - awful might happen 0 0 0 0  Total GAD 7 Score 0 0 0 0  Anxiety Difficulty Not difficult at all Not difficult at all Not difficult at all     ASSESSMENT/PLAN:  Mood disorder [facility]) Assessment & Plan: Managed with Buspirone 5mg  twice daily and Celexa  -Refill Celexa  20 mg daily -Continue Buspirone as prescribed.  Orders: -     Citalopram Hydrobromide; Take 1 tablet (20 mg total) by mouth daily.  Dispense: 90 tablet; Refill: 3  Osteoporosis, unspecified osteoporosis type, unspecified pathological fracture presence Assessment & Plan: On Prolia 60mg  every 6 months, managed by Dr. Cathie Hoops. -Continue Prolia as prescribed. -Continue Vitamin D and Calcium   Orders: -     Denosumab; Inject 60 mg into the skin once for 1 dose.  Chronic obstructive pulmonary disease, unspecified COPD type (HCC) Assessment & Plan: Stable, no recent  exacerbations. Using Symbicort 2 puffs once daily (prescribed for twice daily) and Albuterol as needed. -Continue current regimen as symptoms are controlled.   Malignant neoplasm of  lower-inner quadrant of right breast of female, estrogen receptor positive (HCC) Assessment & Plan: On Arimidex 1mg  daily for approximately 5 years. Follows up with oncologist, Dr. Cathie Hoops, in March. -Continue Arimidex as prescribed.   CKD stage 3b, GFR 30-44 ml/min (HCC) Assessment & Plan: On Calcitriol every other day, managed by Dr. Reymundo Poll. Recent concern for low kidney function. -Follow up with Dr. Reymundo Poll for further management.   Primary hypertension Assessment & Plan: Managed with Lisinopril 40mg  daily and Torsemide 40 mg daily -Continue Lisinopril and Torsemide as prescribed by nephrology -Monitor electrolytes   Hyperlipidemia, unspecified hyperlipidemia type Assessment & Plan: Managed with Crestor 5mg  daily. -Continue Crestor as prescribed.     PDMP reviewed  Return in about 6 months (around 05/24/2024) for PCP, HTN.  Dana Allan, MD

## 2023-12-01 ENCOUNTER — Encounter: Payer: Self-pay | Admitting: Family Medicine

## 2023-12-01 NOTE — Assessment & Plan Note (Signed)
 Managed with Crestor 5mg  daily. -Continue Crestor as prescribed.

## 2023-12-01 NOTE — Assessment & Plan Note (Signed)
 On Prolia 60mg  every 6 months, managed by Dr. Cathie Hoops. -Continue Prolia as prescribed. -Continue Vitamin D and Calcium

## 2023-12-01 NOTE — Assessment & Plan Note (Signed)
 On Calcitriol every other day, managed by Dr. Reymundo Poll. Recent concern for low kidney function. -Follow up with Dr. Reymundo Poll for further management.

## 2023-12-01 NOTE — Assessment & Plan Note (Addendum)
 Managed with Buspirone 5mg  twice daily and Celexa  -Refill Celexa  20 mg daily -Continue Buspirone as prescribed.

## 2023-12-01 NOTE — Assessment & Plan Note (Signed)
 Stable, no recent exacerbations. Using Symbicort 2 puffs once daily (prescribed for twice daily) and Albuterol as needed. -Continue current regimen as symptoms are controlled.

## 2023-12-01 NOTE — Assessment & Plan Note (Addendum)
 Managed with Lisinopril 40mg  daily and Torsemide 40 mg daily -Continue Lisinopril and Torsemide as prescribed by nephrology -Monitor electrolytes

## 2023-12-01 NOTE — Assessment & Plan Note (Signed)
 On Arimidex 1mg  daily for approximately 5 years. Follows up with oncologist, Dr. Cathie Hoops, in March. -Continue Arimidex as prescribed.

## 2023-12-02 ENCOUNTER — Ambulatory Visit: Payer: HMO

## 2023-12-02 DIAGNOSIS — E538 Deficiency of other specified B group vitamins: Secondary | ICD-10-CM

## 2023-12-02 MED ORDER — CYANOCOBALAMIN 1000 MCG/ML IJ SOLN
1000.0000 ug | Freq: Once | INTRAMUSCULAR | Status: AC
Start: 2023-12-02 — End: 2023-12-02
  Administered 2023-12-02: 1000 ug via INTRAMUSCULAR

## 2023-12-02 NOTE — Progress Notes (Signed)
 Pt presented for their vitamin B12 injection. Pt was identified through two identifiers. Pt tolerated shot well in their left deltoid.

## 2023-12-10 ENCOUNTER — Other Ambulatory Visit: Payer: Self-pay

## 2023-12-10 ENCOUNTER — Telehealth: Payer: Self-pay

## 2023-12-10 NOTE — Telephone Encounter (Signed)
 Pt called to r/s appts and had questions regarding Arimidex. Spoke with Dr. Cathie Hoops and she states that pt can take medication until she is out and then she may stop.   Spoke to pt and informed her of this.

## 2023-12-11 DIAGNOSIS — E875 Hyperkalemia: Secondary | ICD-10-CM | POA: Diagnosis not present

## 2023-12-11 DIAGNOSIS — R7309 Other abnormal glucose: Secondary | ICD-10-CM | POA: Diagnosis not present

## 2023-12-11 DIAGNOSIS — D631 Anemia in chronic kidney disease: Secondary | ICD-10-CM | POA: Diagnosis not present

## 2023-12-11 DIAGNOSIS — N1831 Chronic kidney disease, stage 3a: Secondary | ICD-10-CM | POA: Diagnosis not present

## 2023-12-11 DIAGNOSIS — I10 Essential (primary) hypertension: Secondary | ICD-10-CM | POA: Diagnosis not present

## 2023-12-11 DIAGNOSIS — N1330 Unspecified hydronephrosis: Secondary | ICD-10-CM | POA: Diagnosis not present

## 2023-12-11 DIAGNOSIS — N281 Cyst of kidney, acquired: Secondary | ICD-10-CM | POA: Diagnosis not present

## 2023-12-11 DIAGNOSIS — R809 Proteinuria, unspecified: Secondary | ICD-10-CM | POA: Diagnosis not present

## 2023-12-12 ENCOUNTER — Inpatient Hospital Stay: Payer: PPO

## 2023-12-19 ENCOUNTER — Other Ambulatory Visit: Payer: Self-pay

## 2023-12-19 DIAGNOSIS — R809 Proteinuria, unspecified: Secondary | ICD-10-CM | POA: Diagnosis not present

## 2023-12-19 DIAGNOSIS — F419 Anxiety disorder, unspecified: Secondary | ICD-10-CM

## 2023-12-19 DIAGNOSIS — N281 Cyst of kidney, acquired: Secondary | ICD-10-CM | POA: Diagnosis not present

## 2023-12-19 DIAGNOSIS — N1330 Unspecified hydronephrosis: Secondary | ICD-10-CM | POA: Diagnosis not present

## 2023-12-19 DIAGNOSIS — D631 Anemia in chronic kidney disease: Secondary | ICD-10-CM | POA: Diagnosis not present

## 2023-12-19 DIAGNOSIS — I10 Essential (primary) hypertension: Secondary | ICD-10-CM | POA: Diagnosis not present

## 2023-12-19 DIAGNOSIS — N2581 Secondary hyperparathyroidism of renal origin: Secondary | ICD-10-CM | POA: Diagnosis not present

## 2023-12-19 DIAGNOSIS — J449 Chronic obstructive pulmonary disease, unspecified: Secondary | ICD-10-CM | POA: Diagnosis not present

## 2023-12-19 DIAGNOSIS — E875 Hyperkalemia: Secondary | ICD-10-CM | POA: Diagnosis not present

## 2023-12-19 DIAGNOSIS — N1831 Chronic kidney disease, stage 3a: Secondary | ICD-10-CM | POA: Diagnosis not present

## 2023-12-19 DIAGNOSIS — N1832 Chronic kidney disease, stage 3b: Secondary | ICD-10-CM | POA: Diagnosis not present

## 2023-12-19 DIAGNOSIS — E871 Hypo-osmolality and hyponatremia: Secondary | ICD-10-CM | POA: Diagnosis not present

## 2023-12-19 DIAGNOSIS — I129 Hypertensive chronic kidney disease with stage 1 through stage 4 chronic kidney disease, or unspecified chronic kidney disease: Secondary | ICD-10-CM | POA: Diagnosis not present

## 2023-12-19 MED ORDER — BUSPIRONE HCL 5 MG PO TABS: 5.0000 mg | ORAL_TABLET | Freq: Two times a day (BID) | ORAL | 1 refills | Status: DC

## 2023-12-24 ENCOUNTER — Encounter: Payer: Self-pay | Admitting: Emergency Medicine

## 2023-12-24 ENCOUNTER — Emergency Department

## 2023-12-24 ENCOUNTER — Emergency Department
Admission: EM | Admit: 2023-12-24 | Discharge: 2023-12-24 | Disposition: A | Discharge status: Home / Self Care | Attending: Emergency Medicine | Admitting: Emergency Medicine

## 2023-12-24 ENCOUNTER — Other Ambulatory Visit: Payer: Self-pay

## 2023-12-24 DIAGNOSIS — N1330 Unspecified hydronephrosis: Secondary | ICD-10-CM | POA: Insufficient documentation

## 2023-12-24 DIAGNOSIS — N281 Cyst of kidney, acquired: Secondary | ICD-10-CM | POA: Diagnosis not present

## 2023-12-24 DIAGNOSIS — D631 Anemia in chronic kidney disease: Secondary | ICD-10-CM | POA: Diagnosis not present

## 2023-12-24 DIAGNOSIS — R809 Proteinuria, unspecified: Secondary | ICD-10-CM | POA: Diagnosis not present

## 2023-12-24 DIAGNOSIS — N179 Acute kidney failure, unspecified: Secondary | ICD-10-CM | POA: Insufficient documentation

## 2023-12-24 DIAGNOSIS — E875 Hyperkalemia: Secondary | ICD-10-CM | POA: Insufficient documentation

## 2023-12-24 DIAGNOSIS — N189 Chronic kidney disease, unspecified: Secondary | ICD-10-CM | POA: Diagnosis not present

## 2023-12-24 DIAGNOSIS — I10 Essential (primary) hypertension: Secondary | ICD-10-CM | POA: Diagnosis not present

## 2023-12-24 DIAGNOSIS — K5730 Diverticulosis of large intestine without perforation or abscess without bleeding: Secondary | ICD-10-CM | POA: Diagnosis not present

## 2023-12-24 DIAGNOSIS — K449 Diaphragmatic hernia without obstruction or gangrene: Secondary | ICD-10-CM | POA: Diagnosis not present

## 2023-12-24 DIAGNOSIS — I129 Hypertensive chronic kidney disease with stage 1 through stage 4 chronic kidney disease, or unspecified chronic kidney disease: Secondary | ICD-10-CM | POA: Diagnosis not present

## 2023-12-24 LAB — CBC
HCT: 38 % (ref 36.0–46.0)
Hemoglobin: 12.2 g/dL (ref 12.0–15.0)
MCH: 29.4 pg (ref 26.0–34.0)
MCHC: 32.1 g/dL (ref 30.0–36.0)
MCV: 91.6 fL (ref 80.0–100.0)
Platelets: 192 10*3/uL (ref 150–400)
RBC: 4.15 MIL/uL (ref 3.87–5.11)
RDW: 14 % (ref 11.5–15.5)
WBC: 5.6 10*3/uL (ref 4.0–10.5)
nRBC: 0 % (ref 0.0–0.2)

## 2023-12-24 LAB — BASIC METABOLIC PANEL
Anion gap: 9 (ref 5–15)
BUN: 89 mg/dL — ABNORMAL HIGH (ref 8–23)
CO2: 24 mmol/L (ref 22–32)
Calcium: 9.7 mg/dL (ref 8.9–10.3)
Chloride: 108 mmol/L (ref 98–111)
Creatinine, Ser: 2.02 mg/dL — ABNORMAL HIGH (ref 0.44–1.00)
GFR, Estimated: 25 mL/min — ABNORMAL LOW (ref >60)
Glucose, Bld: 105 mg/dL — ABNORMAL HIGH (ref 70–99)
Potassium: 5.7 mmol/L — ABNORMAL HIGH (ref 3.5–5.1)
Sodium: 141 mmol/L (ref 135–145)

## 2023-12-24 LAB — URINALYSIS, ROUTINE W REFLEX MICROSCOPIC
Bilirubin Urine: NEGATIVE
Glucose, UA: NEGATIVE mg/dL
Hgb urine dipstick: NEGATIVE
Ketones, ur: NEGATIVE mg/dL
Leukocytes,Ua: NEGATIVE
Nitrite: NEGATIVE
Protein, ur: NEGATIVE mg/dL
Specific Gravity, Urine: 1.012 (ref 1.005–1.030)
pH: 5 (ref 5.0–8.0)

## 2023-12-24 NOTE — Discharge Instructions (Signed)
 Your labs today fortunately showed improvement in your creatinine and your CT did not show any new findings.  Please do not take your lisinopril as directed by your nephrologist.  Keep a log of your blood pressure and follow-up with your primary care doctor to discuss if any additional medications need to be added.  Continue to follow-up with your nephrologist as directed.  Return to the ER for any new or worsening symptoms.

## 2023-12-24 NOTE — ED Triage Notes (Signed)
 Patient to ED via POV from nephrologist office- Dr Edwena Blow. Concerned that urine is backing up into the kidneys. Cr: 3.8 and K+: 5.6. PT denies any concerns.

## 2023-12-24 NOTE — ED Provider Notes (Signed)
 [facility] Provider Note    Event Date/Time   First MD Initiated Contact with Patient 12/24/23 1219     (approximate)   History   Abnormal Lab   HPI  Tammy Choi is a 78 year old female with history of CKD, HTN, anemia presenting to the emergency department for evaluation of abnormal labs.  Patient was seen by her nephrologist in the office today.  Recent blood work demonstrated worsening kidney function with a creatinine up to 3.8, more recently 1.5.  She does have a history of chronic left-sided hydronephrosis for which she has previously seen urology.  In the setting of her AKI, there was concern for possible obstructive uropathy and was recommended that the patient present to the ER for repeat labs and imaging.  Patient denies any complaints on my evaluation.  Specifically denies abdominal pain, nausea, vomiting, chest pain, shortness of breath.      Physical Exam   Triage Vital Signs: ED Triage Vitals  Encounter Vitals Group     BP 12/24/23 1122 (!) 119/59     Systolic BP Percentile --      Diastolic BP Percentile --      Pulse Rate 12/24/23 1127 77     Resp 12/24/23 1122 18     Temp 12/24/23 1122 98.3 F (36.8 C)     Temp Source 12/24/23 1122 Oral     SpO2 12/24/23 1127 91 %     Weight 12/24/23 1123 142 lb (64.4 kg)     Height 12/24/23 1123 5\' 3"  (1.6 m)     Head Circumference --      Peak Flow --      Pain Score 12/24/23 1123 0     Pain Loc --      Pain Education --      Exclude from Growth Chart --     Most recent vital signs: Vitals:   12/24/23 1122 12/24/23 1127  BP: (!) 119/59   Pulse:  77  Resp: 18   Temp: 98.3 F (36.8 C)   SpO2:  91%     General: Awake, interactive  CV:  Regular rate, good peripheral perfusion.  Resp:  Unlabored respirations, lungs clear to auscultation Abd:  Nondistended, soft, nontender to palpation Neuro:  Symmetric facial movement, fluid speech   ED Results / Procedures / Treatments    Labs (all labs ordered are listed, but only abnormal results are displayed) Labs Reviewed  BASIC METABOLIC PANEL - Abnormal; Notable for the following components:      Result Value   Potassium 5.7 (*)    Glucose, Bld 105 (*)    BUN 89 (*)    Creatinine, Ser 2.02 (*)    GFR, Estimated 25 (*)    All other components within normal limits  URINALYSIS, ROUTINE W REFLEX MICROSCOPIC - Abnormal; Notable for the following components:   Color, Urine YELLOW (*)    APPearance HAZY (*)    All other components within normal limits  CBC  CBG MONITORING, ED     EKG EKG independently reviewed interpreted by myself (ER attending) demonstrates:  EKG demonstrates normal sinus rhythm at a rate of 76, PR 162, QRS 106, QTc 456, interpreted by computer as stabbing, but suspect this is related to significant artifact, patient without cardiopulmonary complaints, will obtain repeat.  Repeat EKG demonstrates sinus rhythm rate of 74, PR 170, QRS 104, QTc 437, nonspecific ST changes noted, does not meet STEMI criteria  RADIOLOGY Imaging independently reviewed  and interpreted by myself demonstrates:  CT abdomen pelvis demonstrates left-sided hydronephrosis   PROCEDURES:  Critical Care performed: No  Procedures   MEDICATIONS ORDERED IN ED: Medications - No data to display   IMPRESSION / MDM / ASSESSMENT AND PLAN / ED COURSE  I reviewed the triage vital signs and the nursing notes.  Differential diagnosis includes, but is not limited to, variable creatinine secondary to hydration status, medication use, obstructive uropathy, intrinsic renal pathology  Patient's presentation is most consistent with acute presentation with potential threat to life or bodily function.  78 year old female presenting with rising creatinine noted in nephrology office.  Overall asymptomatic here.  Case discussed with Dr. Richardo Hanks, as he was consulted by Dr. Suezanne Jacquet earlier today. He reports that he has not seen the  patient in several years, but overall has lower suspicion for primary urology pathology.  Does report that CT imaging can be obtained, and if concern for acute process just a stone, on-call urologist, Dr. Virl [name], can be contacted.  I also spoke with Dr. Suezanne Jacquet.  He had reviewed patient's lab work performed in the ER today demonstrating improving creatinine to 2.02.  She does have mild hyperkalemia, no new for her.  Dr. Suezanne Jacquet does agrees with imaging, consultation for urology as indicated.  If imaging reassuring, will likely be stable for discharge with continued outpatient follow-up.  2:52 PM Clinical Course as of 12/24/23 1452  Tue Dec 24, 2023  1445 CT ABDOMEN PELVIS WO CONTRAST [NR]  1445 CT demonstrates stable hydronephrosis without visible stones felt to be likely chronic UPJ obstruction.  With this, do not think emergent urology consultation is indicated.  Patient reassessed.  No new complaints.  She is comfortable discharge home and follow-up with her PCP and nephrologist.  Strict return precautions provided.  Patient discharged in stable condition. [NR]    Clinical Course User Index [NR] Trinna Post, MD     FINAL CLINICAL IMPRESSION(S) / ED DIAGNOSES   Final diagnoses:  Acute kidney injury (HCC)  Hydronephrosis, unspecified hydronephrosis type     Rx / DC Orders   ED Discharge Orders     None        Note:  This document was prepared using Dragon voice recognition software and may include unintentional dictation errors.   Trinna Post, MD 12/24/23 [phone]

## 2023-12-25 ENCOUNTER — Telehealth: Payer: Self-pay

## 2023-12-25 NOTE — Transitions of Care (Post Inpatient/ED Visit) (Unsigned)
   12/25/2023  Name: Tammy Choi MRN: [id number] DOB: October 14, 1945  Today's TOC FU Call Status: Today's TOC FU Call Status:: Unsuccessful Call (1st Attempt) Unsuccessful Call (1st Attempt) Date: 12/25/23  Attempted to reach the patient regarding the most recent Inpatient/ED visit.  Follow Up Plan: Additional outreach attempts will be made to reach the patient to complete the Transitions of Care (Post Inpatient/ED visit) call.   Signature Karena Addison, LPN [facility] Nurse Health Advisor Direct Dial [phone]

## 2023-12-26 NOTE — Transitions of Care (Post Inpatient/ED Visit) (Signed)
   12/26/2023  Name: Tammy Choi MRN: [id number] DOB: 1946/09/26  Today's TOC FU Call Status: Today's TOC FU Call Status:: Successful TOC FU Call Completed Unsuccessful Call (1st Attempt) Date: 12/25/23 [facility] FU Call Complete Date: 12/26/23 Patient's Name and Date of Birth confirmed.  Transition Care Management Follow-up Telephone Call Date of Discharge: 12/24/23 Discharge Facility: [facility]) Type of Discharge: Emergency Department Reason for ED Visit: Other: (hydronephrosis) How have you been since you were released from the hospital?: Same Any questions or concerns?: No  Items Reviewed: Did you receive and understand the discharge instructions provided?: Yes Medications obtained,verified, and reconciled?: Yes (Medications Reviewed) Any new allergies since your discharge?: No Dietary orders reviewed?: Yes Do you have support at home?: Yes People in Home: child(ren), adult  Medications Reviewed Today: Medications Reviewed Today     Reviewed by Karena Addison, LPN (Licensed Practical Nurse) on 12/26/23 at 1212  Med List Status: <None>   Medication Order Taking? Sig Documenting Provider Last Dose Status Informant  anastrozole (ARIMIDEX) 1 MG tablet [id number] Yes Take 1 tablet (1 mg total) by mouth daily. Rickard Patience, MD Taking Active   busPIRone (BUSPAR) 5 MG tablet [id number] Yes Take 1 tablet (5 mg total) by mouth 2 (two) times daily. Dana Allan, MD Taking Active   calcitRIOL (ROCALTROL) 0.25 MCG capsule [id number] Yes Take 0.25 mcg by mouth every other day. [provider] Taking Active   Cholecalciferol 25 MCG (1000 UT) tablet [id number] Yes Take by mouth. [provider] Taking Active   citalopram (CELEXA) 20 MG tablet [id number] Yes Take 1 tablet (20 mg total) by mouth daily. Dana Allan, MD Taking Active   lisinopril (ZESTRIL) 40 MG tablet [id number] No Take 1 tablet by mouth once daily  Patient not taking: Reported on  12/26/2023   Glori Luis, MD Not Taking Active   [facility] HFA 108 [phone] Base) MCG/ACT inhaler [id number] Yes INHALE 2 PUFFS BY MOUTH EVERY 6 HOURS AS NEEDED FOR WHEEZING OR SHORTNESS OF BREATH  Patient taking differently: Inhale 1-2 puffs into the lungs as needed for wheezing or shortness of breath.   Glori Luis, MD Taking Active   rosuvastatin (CRESTOR) 5 MG tablet [id number] Yes Take 1 tablet by mouth once daily Glori Luis, MD Taking Active   SYMBICORT 160-4.5 MCG/ACT inhaler [id number] Yes Inhale 2 puffs into the lungs 2 (two) times daily. Worthy Rancher B, FNP Taking Active   torsemide (DEMADEX) 10 MG tablet [id number] Yes Take 10 mg by mouth daily. [provider] Taking Active             Home Care and Equipment/Supplies: Were Home Health Services Ordered?: NA Any new equipment or medical supplies ordered?: NA  Functional Questionnaire: Do you need assistance with bathing/showering or dressing?: No Do you need assistance with meal preparation?: No Do you need assistance with eating?: No Do you have difficulty maintaining continence: No Do you need assistance with getting out of bed/getting out of a chair/moving?: No Do you have difficulty managing or taking your medications?: No  Follow up appointments reviewed: PCP Follow-up appointment confirmed?: Yes Date of PCP follow-up appointment?: 12/31/23 Follow-up Provider: [facility] Follow-up appointment confirmed?: Yes Date of Specialist follow-up appointment?: 01/01/24 Follow-Up Specialty Provider:: uro Do you need transportation to your follow-up appointment?: No Do you understand care options if your condition(s) worsen?: Yes-patient verbalized understanding    SIGNATURE Karena Addison, LPN [facility] Nurse Health Advisor Direct Dial [phone]

## 2023-12-31 ENCOUNTER — Encounter: Payer: Self-pay | Admitting: Family Medicine

## 2023-12-31 ENCOUNTER — Ambulatory Visit (INDEPENDENT_AMBULATORY_CARE_PROVIDER_SITE_OTHER): Admitting: Family Medicine

## 2023-12-31 VITALS — BP 113/77 | HR 78 | Temp 98.2°F | Resp 20 | Ht 63.0 in | Wt 140.4 lb

## 2023-12-31 DIAGNOSIS — I10 Essential (primary) hypertension: Secondary | ICD-10-CM

## 2023-12-31 DIAGNOSIS — E875 Hyperkalemia: Secondary | ICD-10-CM

## 2023-12-31 DIAGNOSIS — N1330 Unspecified hydronephrosis: Secondary | ICD-10-CM

## 2023-12-31 DIAGNOSIS — N1832 Chronic kidney disease, stage 3b: Secondary | ICD-10-CM

## 2023-12-31 LAB — COMPREHENSIVE METABOLIC PANEL WITH GFR
ALT: 14 U/L (ref 0–35)
AST: 16 U/L (ref 0–37)
Albumin: 4.4 g/dL (ref 3.5–5.2)
Alkaline Phosphatase: 37 U/L — ABNORMAL LOW (ref 39–117)
BUN: 59 mg/dL — ABNORMAL HIGH (ref 6–23)
CO2: 29 meq/L (ref 19–32)
Calcium: 9.8 mg/dL (ref 8.4–10.5)
Chloride: 100 meq/L (ref 96–112)
Creatinine, Ser: 1.77 mg/dL — ABNORMAL HIGH (ref 0.40–1.20)
GFR: 27.2 mL/min — ABNORMAL LOW (ref >60.00)
Glucose, Bld: 99 mg/dL (ref 70–99)
Potassium: 4.5 meq/L (ref 3.5–5.1)
Sodium: 138 meq/L (ref 135–145)
Total Bilirubin: 0.4 mg/dL (ref 0.2–1.2)
Total Protein: 7.3 g/dL (ref 6.0–8.3)

## 2023-12-31 NOTE — Patient Instructions (Addendum)
 It was a pleasure meeting you today. Thank you for allowing me to take part in your health care.  Our goals for today as we discussed include:  Ok to remain off Lisinopril. Blood pressure goal <140/90 Continue Demedex 10 mg per Kidney doctor Follow up with Dr Suezanne Jacquet April 8 as scheduled  Follow up with Urology tomorrow as scheduled   This is a list of the screening recommended for you and due dates:  Health Maintenance  Topic Date Due   Screening for Lung Cancer  02/08/2017   COVID-19 Vaccine (3 - Moderna risk series) 06/28/2020   Medicare Annual Wellness Visit  03/24/2024   Flu Shot  05/01/2024   Mammogram  06/24/2024   DEXA scan (bone density measurement)  08/29/2024   DTaP/Tdap/Td vaccine (3 - Td or Tdap) 03/09/2033   Pneumonia Vaccine  Completed   Hepatitis C Screening  Completed   Zoster (Shingles) Vaccine  Completed   HPV Vaccine  Aged Out   Colon Cancer Screening  Discontinued     If you have any questions or concerns, please do not hesitate to call the office at [phone].  I look forward to our next visit and until then take care and stay safe.  Regards,   Dana Allan, MD   [facility]

## 2023-12-31 NOTE — Progress Notes (Signed)
 SUBJECTIVE:   Chief Complaint  Patient presents with   Hospitalization Follow-up    Kidney    HPI Presents for follow up hospital visit  Discussed the use of AI scribe software for clinical note transcription with the patient, who gave verbal consent to proceed.  History of Present Illness Tammy Choi is a 78 year old female with chronic kidney disease who presents for a hospital follow-up.  She recently had her blood pressure medication, lisinopril, discontinued during a hospital stay due to concerns about its impact on her kidneys. She had been on lisinopril for an extended period, but her blood pressure is currently well-controlled without it, measuring 113/77 mmHg. She is taking torsemide, initially prescribed for daily use but now adjusted to three times a week after an emergency room visit. She believes it was prescribed to manage fluid retention and high potassium levels. She has not experienced significant weight gain and reports weight loss, currently weighing 140 pounds.  She has chronic kidney disease, currently at stage 3B, and was informed that one of her kidneys is swollen, possibly due to a stricture causing backflow and kidney damage. She is scheduled to see a urologist tomorrow for further evaluation. She has not experienced any pain or difficulty urinating, and her urine appears normal. No history of kidney stones.  She is anxious about blood pressure management due to a family history of stroke, as her daughter has had a stroke. In the review of symptoms, she reports occasional shortness of breath, which she attributes to her chronic respiratory condition. No chest pain, swelling of the legs, fever, fluid accumulation in her lungs, or significant weight gain. She mentions bruising from her hospital stay a week ago.    PERTINENT PMH / PSH: As above  OBJECTIVE:  BP 113/77   Pulse 78   Temp 98.2 F (36.8 C)   Resp 20   Ht 5\' 3"  (1.6 m)   Wt 140 lb 6 oz  (63.7 kg)   SpO2 95%   BMI 24.87 kg/m    Physical Exam Vitals reviewed.  Constitutional:      General: She is not in acute distress.    Appearance: Normal appearance. She is normal weight. She is not ill-appearing, toxic-appearing or diaphoretic.  Eyes:     General:        Right eye: No discharge.        Left eye: No discharge.     Conjunctiva/sclera: Conjunctivae normal.  Cardiovascular:     Rate and Rhythm: Normal rate and regular rhythm.     Heart sounds: Normal heart sounds.  Pulmonary:     Effort: Pulmonary effort is normal.     Breath sounds: Normal breath sounds.  Abdominal:     General: Bowel sounds are normal.  Musculoskeletal:        General: Normal range of motion.  Skin:    General: Skin is warm and dry.  Neurological:     General: No focal deficit present.     Mental Status: She is alert and oriented to person, place, and time. Mental status is at baseline.  Psychiatric:        Mood and Affect: Mood normal.        Behavior: Behavior normal.        Thought Content: Thought content normal.        Judgment: Judgment normal.           12/31/2023   11:18 AM 11/25/2023   10:07  AM 08/02/2023   11:34 AM 05/24/2023   11:22 AM 03/25/2023   10:46 AM  Depression screen PHQ 2/9  Decreased Interest 0 1 0 0 0  Down, Depressed, Hopeless 0 1 0 0 0  PHQ - 2 Score 0 2 0 0 0  Altered sleeping 0 0 0 0   Tired, decreased energy 0 0 0 0   Change in appetite 0 0 0 0   Feeling bad or failure about yourself  0 0 0 0   Trouble concentrating 0 0 0 0   Moving slowly or fidgety/restless 0 0 0 0   Suicidal thoughts 0 0 0 0   PHQ-9 Score 0 2 0 0   Difficult doing work/chores Not difficult at all Somewhat difficult Not difficult at all Not difficult at all       12/31/2023   11:18 AM 11/25/2023   10:07 AM 08/02/2023   11:35 AM 05/24/2023   11:22 AM  GAD 7 : Generalized Anxiety Score  Nervous, Anxious, on Edge 0 0 0 0  Control/stop worrying 0 0 0 0  Worry too much - different  things 0 0 0 0  Trouble relaxing 0 0 0 0  Restless 0 0 0 0  Easily annoyed or irritable 0 0 0 0  Afraid - awful might happen 0 0 0 0  Total GAD 7 Score 0 0 0 0  Anxiety Difficulty Not difficult at all Not difficult at all Not difficult at all Not difficult at all    ASSESSMENT/PLAN:  Primary hypertension Assessment & Plan: Hypertension previously managed with lisinopril, discontinued due to renal concerns. Blood pressure is well-controlled at 113/77 mmHg without medication. Discussed that lisinopril can aid renal perfusion, but alternative antihypertensives may be considered if needed. Target blood pressure is <140/90 mmHg. - Monitor blood pressure    CKD stage 3b, GFR 30-44 ml/min (HCC) Assessment & Plan: Chronic kidney disease stage 3B with elevated potassium levels and renal swelling. Concern for stricture causing backflow and potential renal damage. Currently on torsemide for fluid retention and hyperkalemia. Scheduled for urology evaluation to address renal swelling and potential stricture. - Order kidney function tests today - Refer to urology for evaluation of renal swelling and potential stricture - Continue torsemide until further evaluation - Discussed with Dr Suezanne Jacquet for recommendations.  Orders: -     Comprehensive metabolic panel with GFR  Hydronephrosis, unspecified hydronephrosis type Assessment & Plan: Noted on recent CT abd/pelvis -Follow up with Urology as scheduled  Orders: -     Comprehensive metabolic panel with GFR  Hyperkalemia Assessment & Plan: Recent mild elevation during hospital visit Asymptomatic -Check Cmet  Orders: -     Comprehensive metabolic panel with GFR    PDMP reviewed  Return if symptoms worsen or fail to improve, for PCP.  Dana Allan, MD

## 2023-12-31 NOTE — Progress Notes (Unsigned)
 01/01/2024 9:52 AM   Tammy Choi [birth year] [id number]  Referring provider: Dana Allan, MD [address] [place],  [place] [address]  Urological history: 1. Extrarenal pelvis -CTU (2020) - Prominent extrarenal pelvis noted in each kidney -CT renal stone (11/2023) -mild left hydronephrosis with decompressed left ureter and no visible ureteral stones.  It is similar to prior studies consistent with chronic UPJ obstruction.  2. Bilateral renal cysts -RUS (08/2022) - 2. 2 cm right renal simple cyst. 1.0 cm left renal simple cyst.   Chief Complaint  Patient presents with   Follow-up   HPI: Tammy Choi is a 78 y.o. female who presents today for hydronephrosis with her son, Trey Paula.   Previous records reviewed.   She was seen in the emergency department on December 24, 2023 after her nephrologist noted that her serum creatinine had increased to 3.88.  Her serum creatinine decreased to 2.02 while in the emergency room after IV fluids.  A CT renal stone study did not identify any obstructing left ureteral stone and the left hydronephrosis is mild and is unchanged when compared to her previous imaging and also she has a history of extrarenal pelvis.  She was seen by her PCP yesterday and repeated BMP shows an improvement in her creatinine to 1.77 from 2.02.    She feels well today.  Patient denies any modifying or aggravating factors.  Patient denies any recent UTI's, gross hematuria, dysuria or suprapubic/flank pain.  Patient denies any fevers, chills, nausea or vomiting.    PMH: Past Medical History:  Diagnosis Date   Arthritis    Breast cancer (HCC) 05/07/2018   T1a, N0; ER/ PR positive, Her 2 neu not overexpressed.    COPD (chronic obstructive pulmonary disease) (HCC)    Not on home o2   Depression    HTN (hypertension)    Hyperlipemia    Osteoporosis 07/28/2020   Personal history of radiation therapy     Surgical History: Past Surgical History:  Procedure  Laterality Date   ABDOMINAL HYSTERECTOMY     BREAST BIOPSY Left 04/30/2013   neg core   BREAST BIOPSY Right 05/07/2018   right breast stereo x clip INVASIVE MAMMARY CARCINOMA   BREAST LUMPECTOMY Right 2019   BREAST LUMPECTOMY WITH SENTINEL LYMPH NODE BIOPSY Right 06/16/2018   Procedure: BREAST LUMPECTOMY WITH SENTINEL LYMPH NODE BX;  Surgeon: Earline Mayotte, MD;  Location: [facility] ORS;  Service: General;  Laterality: Right;   COLONOSCOPY  2014   Dr Lemar Livings   Left Leg Surgery     TONSILLECTOMY     TOTAL HIP ARTHROPLASTY Right 03/29/2020   Procedure: TOTAL HIP ARTHROPLASTY;  Surgeon: Christena Flake, MD;  Location: [facility] ORS;  Service: Orthopedics;  Laterality: Right;    Home Medications:  Allergies as of 01/01/2024       Reactions   Penicillins Anaphylaxis, Other (See Comments)   Has patient had a PCN reaction causing immediate rash, facial/tongue/throat swelling, SOB or lightheadedness with hypotension: Yes Has patient had a PCN reaction causing severe rash involving mucus membranes or skin necrosis: No Has patient had a PCN reaction that required hospitalization No Has patient had a PCN reaction occurring within the last 10 years: No If all of the above answers are "NO", then may proceed with Cephalosporin use.        Medication List        Accurate as of January 01, 2024 11:59 PM. If you have any questions, ask your nurse  or doctor.          busPIRone 5 MG tablet Commonly known as: BUSPAR Take 1 tablet (5 mg total) by mouth 2 (two) times daily.   calcitRIOL 0.25 MCG capsule Commonly known as: ROCALTROL Take 0.25 mcg by mouth once a week.   Cholecalciferol 25 MCG (1000 UT) tablet Take by mouth.   citalopram 20 MG tablet Commonly known as: CELEXA Take 1 tablet (20 mg total) by mouth daily.   Cyanocobalamin 1000 MCG/ML Kit Inject as directed every 30 (thirty) days   lisinopril 20 MG tablet Commonly known as: ZESTRIL Take 20 mg by mouth daily.   ProAir HFA 108 (90  Base) MCG/ACT inhaler Generic drug: albuterol INHALE 2 PUFFS BY MOUTH EVERY 6 HOURS AS NEEDED FOR WHEEZING OR SHORTNESS OF BREATH What changed: See the new instructions.   rosuvastatin 5 MG tablet Commonly known as: CRESTOR Take 1 tablet by mouth once daily   Symbicort 160-4.5 MCG/ACT inhaler Generic drug: budesonide-formoterol Inhale 2 puffs into the lungs 2 (two) times daily.   torsemide 10 MG tablet Commonly known as: DEMADEX Take 10 mg by mouth 3 (three) times a week.        Allergies:  Allergies  Allergen Reactions   Penicillins Anaphylaxis and Other (See Comments)    Has patient had a PCN reaction causing immediate rash, facial/tongue/throat swelling, SOB or lightheadedness with hypotension: Yes Has patient had a PCN reaction causing severe rash involving mucus membranes or skin necrosis: No Has patient had a PCN reaction that required hospitalization No Has patient had a PCN reaction occurring within the last 10 years: No If all of the above answers are "NO", then may proceed with Cephalosporin use.    Family History: Family History  Problem Relation Age of Onset   Kidney disease Mother        deceased 92   Heart disease Father        deceased 44   Breast cancer Other 40       maternal half-sister; deceased 40   Colon cancer Neg Hx     Social History:  reports that she quit smoking about 8 years ago. Her smoking use included cigarettes. She started smoking about 43 years ago. She has a 35 pack-year smoking history. She has never used smokeless tobacco. She reports that she does not drink alcohol and does not use drugs.  ROS: Pertinent ROS in HPI  Physical Exam: BP 130/74   Pulse 87   Constitutional:  Well nourished. Alert and oriented, No acute distress. HEENT: [address] AT, moist mucus membranes.  Trachea midline Cardiovascular: No clubbing, cyanosis, or edema. Respiratory: Normal respiratory effort, no increased work of breathing. Neurologic: Grossly intact,  no focal deficits, moving all 4 extremities. Psychiatric: Normal mood and affect.    Laboratory Data: Lab Results  Component Value Date   WBC 5.6 12/24/2023   HGB 12.2 12/24/2023   HCT 38.0 12/24/2023   MCV 91.6 12/24/2023   PLT 192 12/24/2023    Lab Results  Component Value Date   CREATININE 1.71 (H) 01/02/2024   Urinalysis Component     Latest Ref Rng 12/24/2023  Specific Gravity, UA     1.005 - 1.030    pH, UA     5.0 - 7.5    Color, UA     Yellow    Appearance Ur     Clear    Leukocytes,UA     Negative    Protein,UA  Negative/Trace    Glucose, UA     NEGATIVE mg/dL NEGATIVE   Ketones, UA     Negative    RBC, UA     Negative    Bilirubin, UA     Negative    Urobilinogen, Ur     0.2 - 1.0 mg/dL   Nitrite, UA     Negative    Microscopic Examination   Clarity, UA   Glucose     Negative    Urobilinogen, UA     0.2 or 1.0 E.U./dL   Appearance     CLEAR  HAZY !   Color, Urine     YELLOW  YELLOW !   Specific Gravity, Urine     1.005 - 1.030  1.012   pH     5.0 - 8.0  5.0   Hgb urine dipstick     NEGATIVE  NEGATIVE   Bilirubin Urine     NEGATIVE  NEGATIVE   Ketones, ur     NEGATIVE mg/dL NEGATIVE   Protein     NEGATIVE mg/dL NEGATIVE   Nitrite     NEGATIVE  NEGATIVE   Leukocytes,Ua     NEGATIVE  NEGATIVE     Legend: ! Abnormal I have reviewed the labs.   Pertinent Imaging: CLINICAL DATA:  Abdominal pain, flank pain   EXAM: CT ABDOMEN AND PELVIS WITHOUT CONTRAST   TECHNIQUE: Multidetector CT imaging of the abdomen and pelvis was performed following the standard protocol without IV contrast.   RADIATION DOSE REDUCTION: This exam was performed according to the departmental dose-optimization program which includes automated exposure control, adjustment of the mA and/or kV according to patient size and/or use of iterative reconstruction technique.   COMPARISON:  07/06/2011   FINDINGS: Lower chest: No acute abnormality. Small hiatal  hernia. Coronary artery and aortic atherosclerosis.   Hepatobiliary: No focal hepatic abnormality. Gallbladder unremarkable.   Pancreas: No focal abnormality or ductal dilatation.   Spleen: No focal abnormality.  Normal size.   Adrenals/Urinary Tract: Adrenal glands are normal. Mild left hydronephrosis. Left ureter is decompressed. This is stable since prior study compatible with chronic UPJ obstruction. No visible ureteral stones bilaterally. No suspicious renal abnormality. Urinary bladder unremarkable.   Stomach/Bowel: Left colonic diverticulosis. No active diverticulitis. Stomach and small bowel decompressed. No bowel obstruction or inflammatory process.   Vascular/Lymphatic: Diffuse aortoiliac atherosclerosis. No evidence of aneurysm or adenopathy.   Reproductive: Prior hysterectomy.  No adnexal masses.   Other: No free fluid or free air.   Musculoskeletal: Prior right hip replacement. No acute bony abnormality.   IMPRESSION: Mild left hydronephrosis with decompressed left ureter and no visible ureteral stones. This is similar to prior study and suggest chronic UPJ obstruction.   Left colonic diverticulosis.  No active diverticulitis.   Aortoiliac atherosclerosis.  Coronary artery disease.     Electronically Signed   By: Charlett Nose M.D.   On: 12/24/2023 14:37 I have independently reviewed the films.    Assessment & Plan:    1. Left hydronephrosis -has not increased when compared to previous images  -her renal function continues to wax and wane -she may want to consider a stent placement in the future if her renal function continues to worsen  - She will return in 1 month for repeat BMP and further discussion of stent placement  2. CKD IIIb -Followed by nephrology  Return in about 1 month (around 01/31/2024) for repeat BMP and office visit .  These notes generated  with voice recognition software. I apologize for typographical errors.  Cloretta Ned  [facility] [address]  [address] [place], [place] [address] [phone]

## 2024-01-01 ENCOUNTER — Ambulatory Visit: Admitting: Urology

## 2024-01-01 VITALS — BP 130/74 | HR 87

## 2024-01-01 DIAGNOSIS — N1832 Chronic kidney disease, stage 3b: Secondary | ICD-10-CM

## 2024-01-01 DIAGNOSIS — N1330 Unspecified hydronephrosis: Secondary | ICD-10-CM | POA: Diagnosis not present

## 2024-01-02 ENCOUNTER — Inpatient Hospital Stay: Attending: Oncology

## 2024-01-02 ENCOUNTER — Inpatient Hospital Stay

## 2024-01-02 VITALS — BP 131/80

## 2024-01-02 DIAGNOSIS — Z170 Estrogen receptor positive status [ER+]: Secondary | ICD-10-CM | POA: Insufficient documentation

## 2024-01-02 DIAGNOSIS — Z1732 Human epidermal growth factor receptor 2 negative status: Secondary | ICD-10-CM | POA: Diagnosis not present

## 2024-01-02 DIAGNOSIS — Z1722 Progesterone receptor negative status: Secondary | ICD-10-CM | POA: Insufficient documentation

## 2024-01-02 DIAGNOSIS — Z79811 Long term (current) use of aromatase inhibitors: Secondary | ICD-10-CM | POA: Diagnosis not present

## 2024-01-02 DIAGNOSIS — M810 Age-related osteoporosis without current pathological fracture: Secondary | ICD-10-CM

## 2024-01-02 DIAGNOSIS — C50311 Malignant neoplasm of lower-inner quadrant of right female breast: Secondary | ICD-10-CM | POA: Insufficient documentation

## 2024-01-02 LAB — BASIC METABOLIC PANEL - CANCER CENTER ONLY
Anion gap: 9 (ref 5–15)
BUN: 52 mg/dL — ABNORMAL HIGH (ref 8–23)
CO2: 29 mmol/L (ref 22–32)
Calcium: 9.5 mg/dL (ref 8.9–10.3)
Chloride: 100 mmol/L (ref 98–111)
Creatinine: 1.71 mg/dL — ABNORMAL HIGH (ref 0.44–1.00)
GFR, Estimated: 30 mL/min — ABNORMAL LOW (ref >60)
Glucose, Bld: 101 mg/dL — ABNORMAL HIGH (ref 70–99)
Potassium: 4.3 mmol/L (ref 3.5–5.1)
Sodium: 138 mmol/L (ref 135–145)

## 2024-01-02 MED ORDER — DENOSUMAB 60 MG/ML ~~LOC~~ SOSY
60.0000 mg | PREFILLED_SYRINGE | Freq: Once | SUBCUTANEOUS | Status: AC
  Administered 2024-01-02: 60 mg via SUBCUTANEOUS
  Filled 2024-01-02: qty 1

## 2024-01-05 ENCOUNTER — Encounter: Payer: Self-pay | Admitting: Family Medicine

## 2024-01-05 NOTE — Assessment & Plan Note (Signed)
 Noted on recent CT abd/pelvis -Follow up with Urology as scheduled

## 2024-01-05 NOTE — Assessment & Plan Note (Signed)
 Recent mild elevation during hospital visit Asymptomatic -Check Cmet

## 2024-01-05 NOTE — Assessment & Plan Note (Signed)
 Hypertension previously managed with lisinopril, discontinued due to renal concerns. Blood pressure is well-controlled at 113/77 mmHg without medication. Discussed that lisinopril can aid renal perfusion, but alternative antihypertensives may be considered if needed. Target blood pressure is <140/90 mmHg. - Monitor blood pressure

## 2024-01-05 NOTE — Assessment & Plan Note (Signed)
 Chronic kidney disease stage 3B with elevated potassium levels and renal swelling. Concern for stricture causing backflow and potential renal damage. Currently on torsemide for fluid retention and hyperkalemia. Scheduled for urology evaluation to address renal swelling and potential stricture. - Order kidney function tests today - Refer to urology for evaluation of renal swelling and potential stricture - Continue torsemide until further evaluation - Discussed with Dr Suezanne Jacquet for recommendations.

## 2024-01-06 ENCOUNTER — Ambulatory Visit (INDEPENDENT_AMBULATORY_CARE_PROVIDER_SITE_OTHER)

## 2024-01-06 DIAGNOSIS — E538 Deficiency of other specified B group vitamins: Secondary | ICD-10-CM

## 2024-01-06 MED ORDER — CYANOCOBALAMIN 1000 MCG/ML IJ SOLN
1000.0000 ug | Freq: Once | INTRAMUSCULAR | Status: AC
  Administered 2024-01-06: 1000 ug via INTRAMUSCULAR

## 2024-01-06 NOTE — Progress Notes (Signed)
 Pt presented for their vitamin B12 injection. Pt was identified through two identifiers. Pt tolerated shot well in their left deltoid.

## 2024-01-10 ENCOUNTER — Encounter: Payer: Self-pay | Admitting: Urology

## 2024-01-31 ENCOUNTER — Ambulatory Visit: Admitting: Urology

## 2024-02-10 ENCOUNTER — Ambulatory Visit

## 2024-02-10 DIAGNOSIS — E538 Deficiency of other specified B group vitamins: Secondary | ICD-10-CM | POA: Diagnosis not present

## 2024-02-10 MED ORDER — CYANOCOBALAMIN 1000 MCG/ML IJ SOLN
1000.0000 ug | Freq: Once | INTRAMUSCULAR | Status: AC
  Administered 2024-02-10: 1000 ug via INTRAMUSCULAR

## 2024-02-10 NOTE — Progress Notes (Signed)
 Pt presented for their vitamin B12 injection. Pt was identified through two identifiers. Pt tolerated shot well in their left deltoid.

## 2024-02-19 DIAGNOSIS — I10 Essential (primary) hypertension: Secondary | ICD-10-CM | POA: Diagnosis not present

## 2024-02-19 DIAGNOSIS — D631 Anemia in chronic kidney disease: Secondary | ICD-10-CM | POA: Diagnosis not present

## 2024-02-19 DIAGNOSIS — N179 Acute kidney failure, unspecified: Secondary | ICD-10-CM | POA: Diagnosis not present

## 2024-02-19 DIAGNOSIS — N1330 Unspecified hydronephrosis: Secondary | ICD-10-CM | POA: Diagnosis not present

## 2024-02-19 DIAGNOSIS — N281 Cyst of kidney, acquired: Secondary | ICD-10-CM | POA: Diagnosis not present

## 2024-02-19 DIAGNOSIS — E875 Hyperkalemia: Secondary | ICD-10-CM | POA: Diagnosis not present

## 2024-02-19 DIAGNOSIS — R809 Proteinuria, unspecified: Secondary | ICD-10-CM | POA: Diagnosis not present

## 2024-02-26 DIAGNOSIS — E875 Hyperkalemia: Secondary | ICD-10-CM | POA: Diagnosis not present

## 2024-02-26 DIAGNOSIS — N2581 Secondary hyperparathyroidism of renal origin: Secondary | ICD-10-CM | POA: Insufficient documentation

## 2024-02-26 DIAGNOSIS — N184 Chronic kidney disease, stage 4 (severe): Secondary | ICD-10-CM | POA: Diagnosis not present

## 2024-02-26 DIAGNOSIS — I10 Essential (primary) hypertension: Secondary | ICD-10-CM | POA: Diagnosis not present

## 2024-02-26 DIAGNOSIS — R809 Proteinuria, unspecified: Secondary | ICD-10-CM | POA: Diagnosis not present

## 2024-03-16 ENCOUNTER — Ambulatory Visit (INDEPENDENT_AMBULATORY_CARE_PROVIDER_SITE_OTHER): Admitting: *Deleted

## 2024-03-16 DIAGNOSIS — E538 Deficiency of other specified B group vitamins: Secondary | ICD-10-CM

## 2024-03-16 MED ORDER — CYANOCOBALAMIN 1000 MCG/ML IJ SOLN
1000.0000 ug | Freq: Once | INTRAMUSCULAR | Status: AC
  Administered 2024-03-16: 1000 ug via INTRAMUSCULAR

## 2024-03-16 NOTE — Progress Notes (Signed)
Pt received B12 injection in left deltoid muscle. Pt tolerated it well with no complaints or concerns.  

## 2024-03-23 DIAGNOSIS — E875 Hyperkalemia: Secondary | ICD-10-CM | POA: Diagnosis not present

## 2024-03-23 DIAGNOSIS — R809 Proteinuria, unspecified: Secondary | ICD-10-CM | POA: Diagnosis not present

## 2024-03-23 DIAGNOSIS — I10 Essential (primary) hypertension: Secondary | ICD-10-CM | POA: Diagnosis not present

## 2024-03-23 DIAGNOSIS — N184 Chronic kidney disease, stage 4 (severe): Secondary | ICD-10-CM | POA: Diagnosis not present

## 2024-03-25 ENCOUNTER — Ambulatory Visit (INDEPENDENT_AMBULATORY_CARE_PROVIDER_SITE_OTHER): Payer: PPO | Admitting: *Deleted

## 2024-03-25 VITALS — Ht 63.0 in | Wt 142.0 lb

## 2024-03-25 DIAGNOSIS — I129 Hypertensive chronic kidney disease with stage 1 through stage 4 chronic kidney disease, or unspecified chronic kidney disease: Secondary | ICD-10-CM | POA: Diagnosis not present

## 2024-03-25 DIAGNOSIS — Z0000 Encounter for general adult medical examination without abnormal findings: Secondary | ICD-10-CM | POA: Diagnosis not present

## 2024-03-25 DIAGNOSIS — N184 Chronic kidney disease, stage 4 (severe): Secondary | ICD-10-CM | POA: Diagnosis not present

## 2024-03-25 DIAGNOSIS — D631 Anemia in chronic kidney disease: Secondary | ICD-10-CM | POA: Diagnosis not present

## 2024-03-25 DIAGNOSIS — N2581 Secondary hyperparathyroidism of renal origin: Secondary | ICD-10-CM | POA: Diagnosis not present

## 2024-03-25 DIAGNOSIS — R809 Proteinuria, unspecified: Secondary | ICD-10-CM | POA: Diagnosis not present

## 2024-03-25 DIAGNOSIS — E875 Hyperkalemia: Secondary | ICD-10-CM | POA: Diagnosis not present

## 2024-03-25 NOTE — Progress Notes (Signed)
 [facility] Follow Up Visit   Patient Name: Tammy Choi, female   Patient DOB: 1946-08-01 Date of Service: 03/25/2024  Patient MRN: [id number] Provider Creating Note: Woodward Brought, MD  [phone] Primary Care Physician: Hope Merle, MD   [address] [place] [address] Additional Physicians/ Providers:   Impression/Recommendations   Tammy Choi is a 78 y.o. female with hypertension, coronary artery disease, congestive heart failure, COPD, hyperlipidemia, history of breast cancer and a history of hydronephrosis presents for follow up for chronic kidney disease stage IV. Creatinine 1.95, GFR of 26.  KFRE: 3.53% in 2 years and 11.07% in 5 years.    #1:  Chronic Kidney Disease stage IV with hyperkalemia: secondary to hypertension and obstructive uropathy.  - holding lisinopril  due to hyperkalemia and progression of chronic kidney disease - not currently on a mineralocorticoid receptor antagonist due to hyperkalemia - low potassium diet  - No current indication for SGLT-2 inhibitor - Schedule Kidney Education class. Set up for July.    #2:  Secondary hyperparathyroidism: PTH 129.  calcium  and phosphorus now at goal.  - Continue calcitriol 0.25 mcg every other day.      #4:  Hypertension with chronic kidney disease and peripheral edema - change torsemide to daily - not currently on a mineralocorticoid receptor antagonist - salt and fluid restriction. Patient may have 3 cups of coffee  - Elevated lower extremities.       Patient Active Problem List  Diagnosis  . Chronic kidney disease, Stage IV (severe) (HCC)  . Hypertensive chronic kidney disease, benign, with chronic kidney disease stage I through stage IV, or unspecified  . Proteinuria, not otherwise specified  . Anemia in chronic kidney disease  . Aquired multiple cysts of kidney  . Hydronephrosis, not otherwise specified  . Hyperkalemia  . Acute nontraumatic kidney injury, not  otherwise specified (HCC)  . Secondary hyperparathyroidism of renal origin [facility])    Orders Placed This Encounter  . PTH, Intact  . Renal Function Panel  . CBC and Differential  . Urinalysis, Complete w/reflex to Culture  . Protein, Total, Random Urine w/Creatinine (Protein/Creat Ratio)       Return in about 3 months (around 06/25/2024).  Chief Complaint   Chief Complaint  Patient presents with  . Follow-up    History of Present Illness   [name] presents for follow up.  Patient presents by herself. Patient was started on torsemide 10mg  daily on last visit. She states her swelling has improved significantly. No change in weight however.   No other changes in medications. No hospitalizations. Reports no other changes to her health. No complaints.   Patient denies use of nonsteroidal anti-inflammatory agents.   Medications   Current Outpatient Medications:  .  acetaminophen  (TYLENOL ) 500 MG tablet, Take 500-1,000 mg by mouth if needed, Disp: , Rfl:  .  budesonide -formoterol  (SYMBICORT ) 160-4.5 MCG/ACT inhaler, Inhale 2 puffs 2 (two) times a day, Disp: , Rfl:  .  busPIRone  (BUSPAR ) 5 MG tablet, 1 (one) time each day, Disp: , Rfl:  .  calcitriol (Rocaltrol) 0.25 MCG capsule, Take 1 capsule (0.25 mcg total) by mouth every other day (Patient taking differently: Take 0.25 mcg by mouth per week), Disp: 30 capsule, Rfl: 2 .  cholecalciferol  (VITAMIN D -3) 25 MCG (1000 UT) tablet, Take by mouth 1 (one) time each day, Disp: , Rfl:  .  citalopram  (CeleXA ) 20 MG tablet, Take 20 mg by mouth 1 (one) time each  day, Disp: , Rfl:  .  Cyanocobalamin  1000 MCG/ML kit, Inject as directed every 30 (thirty) days, Disp: , Rfl:  .  rosuvastatin  (CRESTOR ) 5 MG tablet, Take 1 tablet by mouth 1 (one) time each day, Disp: , Rfl:  .  torsemide (DEMADEX) 10 MG tablet, Take 1 tablet (10 mg total) by mouth 1 (one) time each day, Disp: 30 tablet, Rfl: 11    Allergies Penicillins  History Past Medical History:  Diagnosis Date  . Chronic kidney disease, Stage IV (severe) (HCC) 11/24/2020  . Chronic obstructive pulmonary disease (HCC)   . Depression, unspecified   . History of radiation therapy   . Hyperlipidemia   . Hypertension   . Hypertensive chronic kidney disease, benign, with chronic kidney disease stage I through stage IV, or unspecified 03/30/2021  . Personal history of breast cancer 05/07/2018  . Secondary hyperparathyroidism of renal origin (HCC) 02/26/2024    Past Surgical History:  Procedure Laterality Date  . BREAST LUMPECTOMY  10/01/2017   R w/ sentinel lymph node biopsy: 7/31/2014L, 8/7/2019R  . COLONOSCOPY  10/01/2012  . HIP ARTHROPLASTY Right 03/29/2020  . LEG SURGERY Left   . TONSILLECTOMY    . TOTAL ABDOMINAL HYSTERECTOMY     Family History  Problem Relation Age of Onset  . Kidney disease Mother   . Heart disease Father    Social History   Tobacco Use  . Smoking status: Former    Current packs/day: 0.00    Types: Cigarettes    Quit date: 2019    Years since quitting: 6.4  . Smokeless tobacco: Never  Substance Use Topics  . Alcohol use: Not Currently     Physical Exam  Vitals BP 130/84 (BP Location: Left upper arm, Patient Position: Standing)   Pulse 78   Temp 97.9 F   Wt 147 lb 9.6 oz (67 kg)   SpO2 95%   BMI 26.15 kg/m   Vitals reviewed. Constitutional: She is oriented to person, place, and time. She appears well-developed.  HEENT:  Head: Normocephalic and atraumatic. Mouth/Throat: Oropharynx is clear and moist.  Eyes: Pupils are equal, round, and reactive to light.  Neck: Neck supple.  Cardiovascular:  Normal rate and regular rhythm.           Pulmonary/Chest: Effort normal and breath sounds normal.  Abdominal: Soft.  Neurological: She is alert and oriented to person, place, and time.  Skin: Skin is warm and dry.     Laboratory Studies  Chemistry  Lab Units 03/23/24 1043  02/19/24 1014 12/19/23 1238 12/11/23 1023 08/08/23 1114 04/03/23 0940 11/29/22 1043 09/17/22 1014 07/25/22 1047  SODIUM mmol/L 141 143 136 138 138 137 138   < > 142  POTASSIUM mmol/L 4.8 5.0 5.6* 5.3 5.8* 5.6* 5.0   < > 4.8  CHLORIDE mmol/L 101 103 102 103 103 102 102   < > 106  CO2 mmol/L 27 28 24 27 29 27 28    < > 24  CALCIUM  mg/dL 9.3 9.5  9.5 9.5 9.8 9.7  9.7 9.5 9.7   < > 9.2  PHOSPHORUS mg/dL 4.9* 5.1* 7.0* 7.0* 4.6* 4.9* 4.7*   < > 4.4*  PTH pg/mL  --  129*  --  69 48 77 39  --  72  VIT D 25 HYDROXY ng/mL  --   --   --   --   --  47  --   --   --   URIC ACID mg/dL  --   --   --  8.2*  --   --   --   --   --   GLUCOSE mg/dL 99 92 99 99 87 95 88   < > 87  ALBUMIN g/dL 4.3 4.2 4.2 4.4 4.2 4.1 3.9   < > 4.4  BUN mg/dL 65* 55* 888* 91* 50* 43* 38*   < > 40*  CREATININE mg/dL 8.04* 7.91* 6.11* 6.99* 1.52* 1.62* 1.39*   < > 1.57*  HEMOGLOBIN A1C % of total Hgb  --   --   --  6.2*  --   --   --   --   --    < > = values in this interval not displayed.        No lab exists for component: IRON SATURATION, TRANSSATPER  CBC  Lab Units 02/19/24 1014 12/11/23 1023 08/08/23 1114 04/03/23 0940 11/29/22 1043 09/17/22 1014 07/25/22 1047  WBC AUTO Thousand/uL 6.7 7.5 6.0 6.7 6.0 6.2 8.3  HEMOGLOBIN g/dL 87.4 87.6 87.4 88.3* 87.4 12.6 12.7  HEMOGLOBIN URINE  NEGATIVE NEGATIVE NEGATIVE NEGATIVE NEGATIVE  --  NEGATIVE  HEMATOCRIT % 40.0 37.9 40.4 36.6 39.3 39.1 39.1  MCV fL 91.3 89.0 89.8 90.1 89.7 88.5 88.1  PLATELETS AUTO Thousand/uL 227 217 246 252 193 218 275    Urine  Lab Units 02/19/24 1014 12/11/23 1023 08/08/23 1114 04/03/23 0940 11/29/22 1043 07/25/22 1047  COLOR U  YELLOW YELLOW YELLOW YELLOW YELLOW YELLOW  KETONES U MG/DL  NEGATIVE NEGATIVE NEGATIVE NEGATIVE NEGATIVE NEGATIVE  PROT/CREAT RATIO UR mg/g creat 0.064  64 0.058  58 NOTE  NOTE  --  0.108  108 0.154  154        No lab exists for component: CYCLOSPORITR     Woodward Brought,  MD  [employer], [place]

## 2024-03-25 NOTE — Progress Notes (Signed)
 Subjective:   Tammy Choi is a 78 y.o. who presents for a Medicare Wellness preventive visit.  As a reminder, Annual Wellness Visits don't include a physical exam, and some assessments may be limited, especially if this visit is performed virtually. We may recommend an in-person follow-up visit with your provider if needed.  Visit Complete: Virtual I connected with  Tammy Choi on 03/25/24 by a audio enabled telemedicine application and verified that I am speaking with the correct person using two identifiers.  Patient Location: Home  Provider Location: Home Office  I discussed the limitations of evaluation and management by telemedicine. The patient expressed understanding and agreed to proceed.  Vital Signs: Because this visit was a virtual/telehealth visit, some criteria may be missing or patient reported. Any vitals not documented were not able to be obtained and vitals that have been documented are patient reported.  VideoDeclined- This patient declined Librarian, academic. Therefore the visit was completed with audio only.  Persons Participating in Visit: Patient.  AWV Questionnaire: Yes: Patient Medicare AWV questionnaire was completed by the patient on 03/24/24; I have confirmed that all information answered by patient is correct and no changes since this date.  Cardiac Risk Factors include: advanced age (>73men, >53 women);dyslipidemia;hypertension;Other (see comment), Risk factor comments: H/O tobacco use     Objective:    Today's Vitals   03/25/24 1004  Weight: 142 lb (64.4 kg)  Height: 5' 3 (1.6 m)   Body mass index is 25.15 kg/m.     03/25/2024   10:16 AM 12/24/2023   11:23 AM 09/05/2023   12:35 PM 03/25/2023   10:47 AM 12/11/2022   10:25 AM 05/14/2022    1:02 PM 04/11/2022   10:31 AM  Advanced Directives  Does Patient Have a Medical Advance Directive? No No No No No No No  Would patient like information on creating a  medical advance directive? No - Patient declined   No - Patient declined No - Patient declined  No - Patient declined    Current Medications (verified) Outpatient Encounter Medications as of 03/25/2024  Medication Sig   acetaminophen  (TYLENOL ) 500 MG tablet Take 500 mg by mouth every 6 (six) hours as needed.   busPIRone  (BUSPAR ) 5 MG tablet Take 1 tablet (5 mg total) by mouth 2 (two) times daily.   calcitRIOL (ROCALTROL) 0.25 MCG capsule Take 0.25 mcg by mouth once a week. (Patient taking differently: Take 0.25 mcg by mouth every other day.)   Cholecalciferol  25 MCG (1000 UT) tablet Take by mouth.   citalopram  (CELEXA ) 20 MG tablet Take 1 tablet (20 mg total) by mouth daily.   Cyanocobalamin  1000 MCG/ML KIT Inject as directed every 30 (thirty) days   PROAIR  HFA 108 (90 Base) MCG/ACT inhaler INHALE 2 PUFFS BY MOUTH EVERY 6 HOURS AS NEEDED FOR WHEEZING OR SHORTNESS OF BREATH (Patient taking differently: Inhale 1-2 puffs into the lungs as needed for wheezing or shortness of breath.)   rosuvastatin  (CRESTOR ) 5 MG tablet Take 1 tablet by mouth once daily   SYMBICORT  160-4.5 MCG/ACT inhaler Inhale 2 puffs into the lungs 2 (two) times daily.   torsemide (DEMADEX) 10 MG tablet Take 10 mg by mouth 3 (three) times a week. (Patient taking differently: Take 10 mg by mouth daily.)   lisinopril  (ZESTRIL ) 20 MG tablet Take 20 mg by mouth daily. (Patient not taking: Reported on 03/25/2024)   No facility-administered encounter medications on file as of 03/25/2024.    Allergies (  verified) Penicillins   History: Past Medical History:  Diagnosis Date   Arthritis    Breast cancer (HCC) 05/07/2018   T1a, N0; ER/ PR positive, Her 2 neu not overexpressed.    COPD (chronic obstructive pulmonary disease) (HCC)    Not on home o2   Depression    HTN (hypertension)    Hyperlipemia    Osteoporosis 07/28/2020   Personal history of radiation therapy    Past Surgical History:  Procedure Laterality Date    ABDOMINAL HYSTERECTOMY     BREAST BIOPSY Left 04/30/2013   neg core   BREAST BIOPSY Right 05/07/2018   right breast stereo x clip INVASIVE MAMMARY CARCINOMA   BREAST LUMPECTOMY Right 2019   BREAST LUMPECTOMY WITH SENTINEL LYMPH NODE BIOPSY Right 06/16/2018   Procedure: BREAST LUMPECTOMY WITH SENTINEL LYMPH NODE BX;  Surgeon: Dessa Reyes ORN, MD;  Location: [facility] ORS;  Service: General;  Laterality: Right;   COLONOSCOPY  2014   Dr Dessa   Left Leg Surgery     TONSILLECTOMY     TOTAL HIP ARTHROPLASTY Right 03/29/2020   Procedure: TOTAL HIP ARTHROPLASTY;  Surgeon: Edie Norleen PARAS, MD;  Location: [facility] ORS;  Service: Orthopedics;  Laterality: Right;   Family History  Problem Relation Age of Onset   Kidney disease Mother        deceased 25   Heart disease Father        deceased 50   Breast cancer Other 84       maternal half-sister; deceased 21   Colon cancer Neg Hx    Social History   Socioeconomic History   Marital status: Divorced    Spouse name: Not on file   Number of children: Not on file   Years of education: Not on file   Highest education level: GED or equivalent  Occupational History   Occupation: Retired Landscape architect: retired  Tobacco Use   Smoking status: Former    Current packs/day: 0.00    Average packs/day: 1 pack/day for 35.0 years (35.0 ttl pk-yrs)    Types: Cigarettes    Start date: 12/19/1980    Quit date: 12/20/2015    Years since quitting: 8.2   Smokeless tobacco: Never  Vaping Use   Vaping status: Never Used  Substance and Sexual Activity   Alcohol use: No    Alcohol/week: 0.0 standard drinks of alcohol   Drug use: No   Sexual activity: Not on file  Other Topics Concern   Not on file  Social History Narrative   Lives with daughter at home. Independent at baseline.   Social Drivers of Health   Financial Resource Strain: Medium Risk (03/24/2024)   Overall Financial Resource Strain (CARDIA)    Difficulty of Paying Living  Expenses: Somewhat hard  Food Insecurity: No Food Insecurity (03/24/2024)   Hunger Vital Sign    Worried About Running Out of Food in the Last Year: Never true    Ran Out of Food in the Last Year: Never true  Transportation Needs: No Transportation Needs (03/24/2024)   PRAPARE - Administrator, Civil Service (Medical): No    Lack of Transportation (Non-Medical): No  Physical Activity: Insufficiently Active (03/24/2024)   Exercise Vital Sign    Days of Exercise per Week: 3 days    Minutes of Exercise per Session: 30 min  Stress: No Stress Concern Present (03/24/2024)   [employer] of Occupational Health - Occupational Stress Questionnaire  Feeling of Stress: Not at all  Social Connections: Moderately Isolated (03/24/2024)   Social Connection and Isolation Panel    Frequency of Communication with Friends and Family: More than three times a week    Frequency of Social Gatherings with Friends and Family: Three times a week    Attends Religious Services: More than 4 times per year    Active Member of Clubs or Organizations: No    Attends Banker Meetings: Never    Marital Status: Divorced    Tobacco Counseling Counseling given: Not Answered    Clinical Intake:  Pre-visit preparation completed: Yes  Pain : No/denies pain     BMI - recorded: 25.15 Nutritional Status: BMI 25 -29 Overweight Nutritional Risks: None Diabetes: No  Lab Results  Component Value Date   HGBA1C 6.2 11/12/2022   HGBA1C 6.3 04/18/2021   HGBA1C 6.3 06/01/2019     How often do you need to have someone help you when you read instructions, pamphlets, or other written materials from your doctor or pharmacy?: 1 - Never  Interpreter Needed?: No  Information entered by :: R. Deveon Kisiel LPN   Activities of Daily Living     03/24/2024    3:12 PM  In your present state of health, do you have any difficulty performing the following activities:  Hearing? 0  Vision? 0  Difficulty  concentrating or making decisions? 0  Walking or climbing stairs? 0  Dressing or bathing? 0  Doing errands, shopping? 1  Preparing Food and eating ? N  Using the Toilet? N  In the past six months, have you accidently leaked urine? Y  Do you have problems with loss of bowel control? N  Managing your Medications? N  Managing your Finances? N  Housekeeping or managing your Housekeeping? N    Patient Care Team: Hope Merle, MD as PCP - General (Family Medicine) Babara Call, MD as Consulting Physician (Oncology) Hyler, Vernell DEL, NP as Nurse Practitioner (Nephrology) Jordis Laneta FALCON, MD as Consulting Physician (General Surgery)  I have updated your Care Teams any recent Medical Services you may have received from other providers in the past year.     Assessment:   This is a routine wellness examination for Tammy Choi.  Hearing/Vision screen Hearing Screening - Comments:: No issues Vision Screening - Comments:: readers   Goals Addressed             This Visit's Progress    Patient Stated       Wants to work on improving her kidney functions       Depression Screen     03/25/2024   10:12 AM 12/31/2023   11:18 AM 11/25/2023   10:07 AM 08/02/2023   11:34 AM 05/24/2023   11:22 AM 03/25/2023   10:46 AM 11/12/2022   10:35 AM  PHQ 2/9 Scores  PHQ - 2 Score 0 0 2 0 0 0 0  PHQ- 9 Score 0 0 2 0 0  0    Fall Risk     03/24/2024    3:12 PM 12/31/2023   11:18 AM 11/25/2023   10:06 AM 09/09/2023   11:06 AM 08/02/2023   11:34 AM  Fall Risk   Falls in the past year? 1 0 0 0 0  Number falls in past yr: 0 0 0 0 0  Injury with Fall? 0 0 0 0 0  Risk for fall due to : History of fall(s);Impaired balance/gait No Fall Risks No Fall Risks No Fall  Risks No Fall Risks  Follow up Falls evaluation completed;Falls prevention discussed Falls evaluation completed;Education provided Falls evaluation completed;Education provided Falls evaluation completed Falls evaluation completed    MEDICARE RISK AT  HOME:  Medicare Risk at Home Any stairs in or around the home?: (Patient-Rptd) No If so, are there any without handrails?: (Patient-Rptd) No Home free of loose throw rugs in walkways, pet beds, electrical cords, etc?: (Patient-Rptd) Yes Adequate lighting in your home to reduce risk of falls?: (Patient-Rptd) Yes Life alert?: (Patient-Rptd) No Use of a cane, walker or w/c?: (Patient-Rptd) No Grab bars in the bathroom?: (Patient-Rptd) No Shower chair or bench in shower?: (Patient-Rptd) No Elevated toilet seat or a handicapped toilet?: (Patient-Rptd) No  TIMED UP AND GO:  Was the test performed?  No  Cognitive Function: 6CIT completed        03/25/2024   10:17 AM 03/25/2023   10:47 AM 03/10/2021    2:20 PM 03/09/2020    2:49 PM 03/03/2019    9:47 AM  6CIT Screen  What Year? 0 points 0 points 0 points 0 points 0 points  What month? 0 points 0 points 0 points 0 points 0 points  What time? 0 points 0 points 0 points  0 points  Count back from 20 0 points 0 points   0 points  Months in reverse 0 points 0 points 0 points 0 points 0 points  Repeat phrase 0 points 0 points  4 points 0 points  Total Score 0 points 0 points   0 points    Immunizations Immunization History  Administered Date(s) Administered   Fluad Quad(high Dose 65+) 06/01/2019, 07/14/2020, 08/21/2021, 09/04/2022   Fluad Trivalent(High Dose 65+) 08/02/2023   Influenza Split 08/07/2011, 07/16/2012   Influenza, High Dose Seasonal PF 07/10/2016, 08/01/2017, 07/10/2018   Influenza,inj,Quad PF,6+ Mos 07/30/2014, 07/21/2015   Influenza-Unspecified 07/27/2013   Pneumococcal Conjugate-13 03/30/2014   Pneumococcal Polysaccharide-23 07/16/2010   Respiratory Syncytial Virus Vaccine,Recomb Aduvanted(Arexvy) 03/24/2023   Tdap 03/10/2023, 03/10/2023   Zoster Recombinant(Shingrix) 03/10/2023, 03/10/2023, 05/10/2023    Screening Tests Health Maintenance  Topic Date Due   Lung Cancer Screening  02/08/2017   COVID-19 Vaccine (3 -  Moderna risk series) 06/28/2020   Medicare Annual Wellness (AWV)  03/24/2024   INFLUENZA VACCINE  05/01/2024   MAMMOGRAM  06/24/2024   DEXA SCAN  08/29/2024   DTaP/Tdap/Td (3 - Td or Tdap) 03/09/2033   Pneumococcal Vaccine: 50+ Years  Completed   Hepatitis C Screening  Completed   Zoster Vaccines- Shingrix  Completed   Hepatitis B Vaccines  Aged Out   HPV VACCINES  Aged Out   Meningococcal B Vaccine  Aged Out   Colonoscopy  Discontinued    Health Maintenance  Health Maintenance Due  Topic Date Due   Lung Cancer Screening  02/08/2017   COVID-19 Vaccine (3 - Moderna risk series) 06/28/2020   Medicare Annual Wellness (AWV)  03/24/2024   Health Maintenance Items Addressed: Patient declines covid vaccine. Patient stated that she is aware that she is overdue a lung cancer screening. Patient stated that she has had so many other health issues in the past couple of years that she has put that off. Patient stated that she will discuss having the screening done with her PCP at next office visit. Patient stated that she is dealing with some kidney issues now.  Additional Screening:  Vision Screening: Recommended annual ophthalmology exams for early detection of glaucoma and other disorders of the eye.Overdue. Patient stated that she will  call and schedule an eye appointment at [facility] Would you like a referral to an eye doctor? No    Dental Screening: Recommended annual dental exams for proper oral hygiene  Community Resource Referral / Chronic Care Management: CRR required this visit?  No   CCM required this visit?  No   Plan:    I have personally reviewed and noted the following in the patient's chart:   Medical and social history Use of alcohol, tobacco or illicit drugs  Current medications and supplements including opioid prescriptions. Patient is not currently taking opioid prescriptions. Functional ability and status Nutritional status Physical activity Advanced  directives List of other physicians Hospitalizations, surgeries, and ER visits in previous 12 months Vitals Screenings to include cognitive, depression, and falls Referrals and appointments  In addition, I have reviewed and discussed with patient certain preventive protocols, quality metrics, and best practice recommendations. A written personalized care plan for preventive services as well as general preventive health recommendations were provided to patient.   Angeline Fredericks, LPN   [id number]   After Visit Summary: (MyChart) Due to this being a telephonic visit, the after visit summary with patients personalized plan was offered to patient via MyChart   Notes: Nothing significant to report at this time.

## 2024-03-25 NOTE — Patient Instructions (Signed)
 Tammy Choi , Thank you for taking time out of your busy schedule to complete your Annual Wellness Visit with me. I enjoyed our conversation and look forward to speaking with you again next year. I, as well as your care team,  appreciate your ongoing commitment to your health goals. Please review the following plan we discussed and let me know if I can assist you in the future. Your Game plan/ To Do List    Referrals: If you haven't heard from the office you've been referred to, please reach out to them at the phone provided.  Remember to call and schedule an eye appointment Follow up Visits: Next Medicare AWV with our clinical staff: 03/29/24   Have you seen your provider in the last 6 months (3 months if uncontrolled diabetes)? Yes Next Office Visit with your provider: 07/30/24  Clinician Recommendations:  Aim for 30 minutes of exercise or brisk walking, 6-8 glasses of water, and 5 servings of fruits and vegetables each day.       This is a list of the screening recommended for you and due dates:  Health Maintenance  Topic Date Due   Screening for Lung Cancer  02/08/2017   COVID-19 Vaccine (3 - Moderna risk series) 06/28/2020   Flu Shot  05/01/2024   Mammogram  06/24/2024   DEXA scan (bone density measurement)  08/29/2024   Medicare Annual Wellness Visit  03/25/2025   DTaP/Tdap/Td vaccine (3 - Td or Tdap) 03/09/2033   Pneumococcal Vaccine for age over 68  Completed   Hepatitis C Screening  Completed   Zoster (Shingles) Vaccine  Completed   Hepatitis B Vaccine  Aged Out   HPV Vaccine  Aged Out   Meningitis B Vaccine  Aged Out   Colon Cancer Screening  Discontinued    Advanced directives: (ACP Link)Information on Advanced Care Planning can be found at [place]  Secretary of [facility] Advance Health Care Directives Advance Health Care Directives. [email or web address]  Advance Care Planning is important because it:  [x]  Makes sure you receive the medical care that is consistent with your  values, goals, and preferences  [x]  It provides guidance to your family and loved ones and reduces their decisional burden about whether or not they are making the right decisions based on your wishes.  Follow the link provided in your after visit summary or read over the paperwork we have mailed to you to help you started getting your Advance Directives in place. If you need assistance in completing these, please reach out to us  so that we can help you!

## 2024-04-15 ENCOUNTER — Ambulatory Visit

## 2024-04-15 DIAGNOSIS — E538 Deficiency of other specified B group vitamins: Secondary | ICD-10-CM

## 2024-04-15 MED ORDER — CYANOCOBALAMIN 1000 MCG/ML IJ SOLN
1000.0000 ug | Freq: Once | INTRAMUSCULAR | Status: AC
  Administered 2024-04-15: 1000 ug via INTRAMUSCULAR

## 2024-04-15 NOTE — Progress Notes (Signed)
 Patient was administered a b12 injection into her left deltoid. Patient tolerated the b12 injection well.

## 2024-04-16 ENCOUNTER — Other Ambulatory Visit: Payer: Self-pay

## 2024-04-16 DIAGNOSIS — Z1231 Encounter for screening mammogram for malignant neoplasm of breast: Secondary | ICD-10-CM

## 2024-04-21 ENCOUNTER — Encounter: Payer: Self-pay | Admitting: Oncology

## 2024-04-21 DIAGNOSIS — N184 Chronic kidney disease, stage 4 (severe): Secondary | ICD-10-CM | POA: Diagnosis not present

## 2024-05-04 ENCOUNTER — Encounter: Payer: Self-pay | Admitting: Oncology

## 2024-05-08 ENCOUNTER — Other Ambulatory Visit: Payer: Self-pay

## 2024-05-08 DIAGNOSIS — Z760 Encounter for issue of repeat prescription: Secondary | ICD-10-CM

## 2024-05-08 MED ORDER — ROSUVASTATIN CALCIUM 5 MG PO TABS: 5.0000 mg | ORAL_TABLET | Freq: Every day | ORAL | 0 refills | Status: DC

## 2024-05-08 NOTE — Telephone Encounter (Signed)
 Copied from CRM [id number]. Topic: Clinical - Medication Refill >> May 08, 2024 12:57 PM Pinkey ORN wrote: Medication: rosuvastatin  (CRESTOR ) 5 MG tablet  Has the patient contacted their pharmacy? Yes (Agent: If no, request that the patient contact the pharmacy for the refill. If patient does not wish to contact the pharmacy document the reason why and proceed with request.) (Agent: If yes, when and what did the pharmacy advise?)  This is the patient's preferred pharmacy:  [facility] [address] [place] - [address] [place] [address] Phone: [phone] Fax: [phone]  Is this the correct pharmacy for this prescription? Yes If no, delete pharmacy and type the correct one.   Has the prescription been filled recently? No  Is the patient out of the medication? Yes  Has the patient been seen for an appointment in the last year OR does the patient have an upcoming appointment? Yes  Can we respond through MyChart? Yes  Agent: Please be advised that Rx refills may take up to 3 business days. We ask that you follow-up with your pharmacy.

## 2024-05-08 NOTE — Telephone Encounter (Signed)
Patient states she needs a 90 day supply.

## 2024-05-18 ENCOUNTER — Ambulatory Visit

## 2024-05-18 DIAGNOSIS — E538 Deficiency of other specified B group vitamins: Secondary | ICD-10-CM | POA: Diagnosis not present

## 2024-05-18 MED ORDER — CYANOCOBALAMIN 1000 MCG/ML IJ SOLN
1000.0000 ug | Freq: Once | INTRAMUSCULAR | Status: AC
Start: 2024-05-18 — End: 2024-05-18
  Administered 2024-05-18: 1000 ug via INTRAMUSCULAR

## 2024-05-18 NOTE — Progress Notes (Signed)
Pt received B12 injection in Left deltoid. Pt tolerated it well with no complaints or concerns.

## 2024-05-21 DIAGNOSIS — N184 Chronic kidney disease, stage 4 (severe): Secondary | ICD-10-CM | POA: Diagnosis not present

## 2024-05-25 ENCOUNTER — Ambulatory Visit: Payer: HMO | Admitting: Family Medicine

## 2024-06-11 ENCOUNTER — Encounter: Payer: Self-pay | Admitting: Oncology

## 2024-06-11 ENCOUNTER — Inpatient Hospital Stay: Payer: PPO | Attending: Oncology

## 2024-06-11 ENCOUNTER — Inpatient Hospital Stay: Payer: PPO

## 2024-06-11 ENCOUNTER — Inpatient Hospital Stay: Payer: PPO | Admitting: Oncology

## 2024-06-11 VITALS — BP 154/71 | HR 65 | Temp 97.0°F | Resp 18 | Wt 148.9 lb

## 2024-06-11 DIAGNOSIS — M810 Age-related osteoporosis without current pathological fracture: Secondary | ICD-10-CM | POA: Insufficient documentation

## 2024-06-11 DIAGNOSIS — Z923 Personal history of irradiation: Secondary | ICD-10-CM | POA: Diagnosis not present

## 2024-06-11 DIAGNOSIS — Z1722 Progesterone receptor negative status: Secondary | ICD-10-CM | POA: Diagnosis not present

## 2024-06-11 DIAGNOSIS — Z1732 Human epidermal growth factor receptor 2 negative status: Secondary | ICD-10-CM | POA: Insufficient documentation

## 2024-06-11 DIAGNOSIS — Z79811 Long term (current) use of aromatase inhibitors: Secondary | ICD-10-CM | POA: Diagnosis not present

## 2024-06-11 DIAGNOSIS — C50311 Malignant neoplasm of lower-inner quadrant of right female breast: Secondary | ICD-10-CM | POA: Insufficient documentation

## 2024-06-11 DIAGNOSIS — Z170 Estrogen receptor positive status [ER+]: Secondary | ICD-10-CM | POA: Insufficient documentation

## 2024-06-11 DIAGNOSIS — M818 Other osteoporosis without current pathological fracture: Secondary | ICD-10-CM | POA: Diagnosis not present

## 2024-06-11 DIAGNOSIS — N1832 Chronic kidney disease, stage 3b: Secondary | ICD-10-CM

## 2024-06-11 DIAGNOSIS — Z87891 Personal history of nicotine dependence: Secondary | ICD-10-CM | POA: Insufficient documentation

## 2024-06-11 DIAGNOSIS — I129 Hypertensive chronic kidney disease with stage 1 through stage 4 chronic kidney disease, or unspecified chronic kidney disease: Secondary | ICD-10-CM | POA: Diagnosis not present

## 2024-06-11 LAB — CBC WITH DIFFERENTIAL (CANCER CENTER ONLY)
Abs Immature Granulocytes: 0.02 K/uL (ref 0.00–0.07)
Basophils Absolute: 0 K/uL (ref 0.0–0.1)
Basophils Relative: 1 %
Eosinophils Absolute: 0.1 K/uL (ref 0.0–0.5)
Eosinophils Relative: 2 %
HCT: 41.7 % (ref 36.0–46.0)
Hemoglobin: 13.1 g/dL (ref 12.0–15.0)
Immature Granulocytes: 0 %
Lymphocytes Relative: 42 %
Lymphs Abs: 2.5 K/uL (ref 0.7–4.0)
MCH: 28.1 pg (ref 26.0–34.0)
MCHC: 31.4 g/dL (ref 30.0–36.0)
MCV: 89.3 fL (ref 80.0–100.0)
Monocytes Absolute: 0.5 K/uL (ref 0.1–1.0)
Monocytes Relative: 8 %
Neutro Abs: 2.9 K/uL (ref 1.7–7.7)
Neutrophils Relative %: 47 %
Platelet Count: 181 K/uL (ref 150–400)
RBC: 4.67 MIL/uL (ref 3.87–5.11)
RDW: 15 % (ref 11.5–15.5)
WBC Count: 6.1 K/uL (ref 4.0–10.5)
nRBC: 0 % (ref 0.0–0.2)

## 2024-06-11 LAB — CMP (CANCER CENTER ONLY)
ALT: 20 U/L (ref 0–44)
AST: 21 U/L (ref 15–41)
Albumin: 4 g/dL (ref 3.5–5.0)
Alkaline Phosphatase: 37 U/L — ABNORMAL LOW (ref 38–126)
Anion gap: 11 (ref 5–15)
BUN: 49 mg/dL — ABNORMAL HIGH (ref 8–23)
CO2: 29 mmol/L (ref 22–32)
Calcium: 9.2 mg/dL (ref 8.9–10.3)
Chloride: 98 mmol/L (ref 98–111)
Creatinine: 1.74 mg/dL — ABNORMAL HIGH (ref 0.44–1.00)
GFR, Estimated: 30 mL/min — ABNORMAL LOW (ref >60)
Glucose, Bld: 101 mg/dL — ABNORMAL HIGH (ref 70–99)
Potassium: 4.6 mmol/L (ref 3.5–5.1)
Sodium: 138 mmol/L (ref 135–145)
Total Bilirubin: 0.5 mg/dL (ref 0.0–1.2)
Total Protein: 6.9 g/dL (ref 6.5–8.1)

## 2024-06-11 NOTE — Assessment & Plan Note (Signed)
 Encourage oral hydration and avoid nephrotoxins.

## 2024-06-11 NOTE — Assessment & Plan Note (Addendum)
 Stage IA breast cancer ER positive, PR negative, HER-2 negative. Clinically she is doing well.  Labs are reviewed and discussed with patient. Continue Arimidex - finished in Jan 2025  Annual mammogram, due in Sept 2025, she get mammogram ordered through surgeon's office.

## 2024-06-11 NOTE — Assessment & Plan Note (Addendum)
 she has full mouth dentures.   DEXA in Sept 2023 showed osteoporosis Continue calcium  and vitamin D  supplementation.  Prolia  every 6 months.   Repeat DEXA

## 2024-06-11 NOTE — Progress Notes (Signed)
 Hematology/Oncology Progress note Telephone:([phone] [phone]   REASON FOR VISIT:  Follow up for breast cancer  ASSESSMENT & PLAN:   Cancer Staging  Malignant neoplasm of lower-inner quadrant of right breast of female, estrogen receptor positive (HCC) Staging form: Breast, AJCC 8th Edition - Pathologic stage from 09/29/2018: Stage IA (pT1a, pN0, cM0, G2, ER+, PR-, HER2-) - Signed by Babara Call, MD on 09/29/2018   Malignant neoplasm of lower-inner quadrant of right breast of female, estrogen receptor positive (HCC) Stage IA breast cancer ER positive, PR negative, HER-2 negative. Clinically she is doing well.  Labs are reviewed and discussed with patient. Continue Arimidex - finished in Jan 2025  Annual mammogram, due in Sept 2025, she get mammogram ordered through surgeon's office.   Osteoporosis she has full mouth dentures.   DEXA in Sept 2023 showed osteoporosis Continue calcium  and vitamin D  supplementation.  Prolia  every 6 months.   Repeat DEXA   CKD stage 3b, GFR 30-44 ml/min (HCC) Encourage oral hydration and avoid nephrotoxins.    Orders Placed This Encounter  Procedures   DG Bone Density    Standing Status:   Future    Expected Date:   09/08/2024    Expiration Date:   06/11/2025    Reason for Exam (SYMPTOM  OR DIAGNOSIS REQUIRED):   breast cancer    Preferred imaging location?:   [facility]   Basic Metabolic Panel - Cancer Center Only    Standing Status:   Future    Expected Date:   12/09/2024    Expiration Date:   06/11/2025   CMP (Cancer Center only)    Standing Status:   Future    Expected Date:   06/11/2025    Expiration Date:   09/09/2025   CBC with Differential (Cancer Center Only)    Standing Status:   Future    Expected Date:   06/11/2025    Expiration Date:   09/09/2025   Basic Metabolic Panel - Cancer Center Only    Standing Status:   Future    Expected Date:   07/02/2024    Expiration Date:   09/30/2024   Follow up in 6 months  lab + prolia   12 months lab MD + prolia  All questions were answered. The patient knows to call the clinic with any problems, questions or concerns.  Call Babara, MD, PhD [facility] Hematology Oncology 06/11/2024   HISTORY OF PRESENTING ILLNESS:  Tammy Choi is a  78 y.o.  female with PMH listed below who was referred to me for evaluation of breast cancer. Patient had mammogram 05/01/2018 which showed indeterminate microcalcification in the medial right breast measuring 0.9 x 0.7 x 0.7 cm Biopsy pathology showed: Invasive mammary carcinoma, DCIS, calcification associated with DCIS, grade 2, ER positive PR negative, HER-2 negative (IHC 1+)  Nipple discharge: Denies Family history: Sister passed away from breast cancer History of radiation to chest: denies.  Previous breast surgery: Left breast biopsy with benign etiology. . 06/16/2018, right breast lumpectomy and sentinel lymph node biopsy showed residual invasive mammary carcinoma, residual ductal carcinoma in situ, biopsy site changes and metallic clip.  Incidental 5 mm fibroadenoma.  All 3 sentinel lymph node negative. ER positive HER2 negative.  Patient underwent adjuvant radiation finished in January 2020. Started aromatase inhibitor with Arimidex  1 mg daily since 1/20/ 2020.  # she followed up with urology Dr. Francisca for bilateral hydronephrosis. Patient was recommended to have CT urogram done for evaluation of filling defects with monitoring kidney  function versus more invasive investigation with cystoscopy, bilateral retrograde pyelograms and possible ureteroscopy possible biopsy.she choose to have a image surveillance with monitoring of kidney function.  # Arimidex   Since Jan 2020.  #Chronic bilateral hydronephrosis, she has had work up with Urology and no obstruction has been found.  INTERVAL HISTORY Tammy Choi is a 78 y.o. female who has above history reviewed by me today presents for follow up visit for management of  Stage IA right breast cancer, ER+ HER2 - She has no new complaints.  Patient finishes 5 years of endocrinotherapy, and has been off Arimidex  since Jan 2025.   Review of Systems  Constitutional:  Negative for chills, fever, malaise/fatigue and weight loss.  HENT:  Negative for nosebleeds and sore throat.   Eyes:  Negative for double vision, photophobia and redness.  Respiratory:  Negative for cough, shortness of breath and wheezing.   Cardiovascular:  Negative for chest pain, palpitations, orthopnea and leg swelling.  Gastrointestinal:  Negative for abdominal pain, blood in stool, nausea and vomiting.  Genitourinary:  Negative for dysuria.  Musculoskeletal:  Negative for back pain, myalgias and neck pain.       Right hip replacement.   Skin:  Negative for itching and rash.  Neurological:  Negative for dizziness, tingling and tremors.  Endo/Heme/Allergies:  Negative for environmental allergies. Does not bruise/bleed easily.  Psychiatric/Behavioral:  Negative for depression and hallucinations. The patient is not nervous/anxious.     MEDICAL HISTORY:  Past Medical History:  Diagnosis Date   Arthritis    Breast cancer (HCC) 05/07/2018   T1a, N0; ER/ PR positive, Her 2 neu not overexpressed.    COPD (chronic obstructive pulmonary disease) (HCC)    Not on home o2   Depression    HTN (hypertension)    Hyperlipemia    Osteoporosis 07/28/2020   Personal history of radiation therapy     SURGICAL HISTORY: Past Surgical History:  Procedure Laterality Date   ABDOMINAL HYSTERECTOMY     BREAST BIOPSY Left 04/30/2013   neg core   BREAST BIOPSY Right 05/07/2018   right breast stereo x clip INVASIVE MAMMARY CARCINOMA   BREAST LUMPECTOMY Right 2019   BREAST LUMPECTOMY WITH SENTINEL LYMPH NODE BIOPSY Right 06/16/2018   Procedure: BREAST LUMPECTOMY WITH SENTINEL LYMPH NODE BX;  Surgeon: Dessa Reyes ORN, MD;  Location: [facility] ORS;  Service: General;  Laterality: Right;   COLONOSCOPY  2014   Dr  Dessa   Left Leg Surgery     TONSILLECTOMY     TOTAL HIP ARTHROPLASTY Right 03/29/2020   Procedure: TOTAL HIP ARTHROPLASTY;  Surgeon: Edie Norleen PARAS, MD;  Location: [facility] ORS;  Service: Orthopedics;  Laterality: Right;    SOCIAL HISTORY: Social History   Socioeconomic History   Marital status: Divorced    Spouse name: Not on file   Number of children: Not on file   Years of education: Not on file   Highest education level: GED or equivalent  Occupational History   Occupation: Retired Landscape architect: retired  Tobacco Use   Smoking status: Former    Current packs/day: 0.00    Average packs/day: 1 pack/day for 35.0 years (35.0 ttl pk-yrs)    Types: Cigarettes    Start date: 12/19/1980    Quit date: 12/20/2015    Years since quitting: 8.4   Smokeless tobacco: Never  Vaping Use   Vaping status: Never Used  Substance and Sexual Activity   Alcohol use: No  Alcohol/week: 0.0 standard drinks of alcohol   Drug use: No   Sexual activity: Not on file  Other Topics Concern   Not on file  Social History Narrative   Lives with daughter at home. Independent at baseline.   Social Drivers of Health   Financial Resource Strain: Medium Risk (03/24/2024)   Overall Financial Resource Strain (CARDIA)    Difficulty of Paying Living Expenses: Somewhat hard  Food Insecurity: No Food Insecurity (03/24/2024)   Hunger Vital Sign    Worried About Running Out of Food in the Last Year: Never true    Ran Out of Food in the Last Year: Never true  Transportation Needs: No Transportation Needs (03/24/2024)   PRAPARE - Administrator, Civil Service (Medical): No    Lack of Transportation (Non-Medical): No  Physical Activity: Insufficiently Active (03/24/2024)   Exercise Vital Sign    Days of Exercise per Week: 3 days    Minutes of Exercise per Session: 30 min  Stress: No Stress Concern Present (03/24/2024)   [employer] of Occupational Health - Occupational Stress  Questionnaire    Feeling of Stress: Not at all  Social Connections: Moderately Isolated (03/24/2024)   Social Connection and Isolation Panel    Frequency of Communication with Friends and Family: More than three times a week    Frequency of Social Gatherings with Friends and Family: Three times a week    Attends Religious Services: More than 4 times per year    Active Member of Clubs or Organizations: No    Attends Banker Meetings: Never    Marital Status: Divorced  Catering manager Violence: Not At Risk (03/25/2024)   Humiliation, Afraid, Rape, and Kick questionnaire    Fear of Current or Ex-Partner: No    Emotionally Abused: No    Physically Abused: No    Sexually Abused: No    FAMILY HISTORY: Family History  Problem Relation Age of Onset   Kidney disease Mother        deceased 91   Heart disease Father        deceased 70   Breast cancer Other 40       maternal half-sister; deceased 75   Colon cancer Neg Hx     ALLERGIES:  is allergic to penicillins.  MEDICATIONS:  Current Outpatient Medications  Medication Sig Dispense Refill   acetaminophen  (TYLENOL ) 500 MG tablet Take 500 mg by mouth every 6 (six) hours as needed.     busPIRone  (BUSPAR ) 5 MG tablet Take 1 tablet (5 mg total) by mouth 2 (two) times daily. 180 tablet 1   calcitRIOL (ROCALTROL) 0.25 MCG capsule Take 0.25 mcg by mouth once a week. (Patient taking differently: Take 0.25 mcg by mouth every other day.)     Cholecalciferol  25 MCG (1000 UT) tablet Take by mouth.     citalopram  (CELEXA ) 20 MG tablet Take 1 tablet (20 mg total) by mouth daily. 90 tablet 3   Cyanocobalamin  1000 MCG/ML KIT Inject as directed every 30 (thirty) days     PROAIR  HFA 108 (90 Base) MCG/ACT inhaler INHALE 2 PUFFS BY MOUTH EVERY 6 HOURS AS NEEDED FOR WHEEZING OR SHORTNESS OF BREATH (Patient taking differently: Inhale 1-2 puffs into the lungs as needed for wheezing or shortness of breath.) 9 g 0   rosuvastatin  (CRESTOR ) 5 MG  tablet Take 1 tablet (5 mg total) by mouth daily. 90 tablet 0   SYMBICORT  160-4.5 MCG/ACT inhaler Inhale 2 puffs into the  lungs 2 (two) times daily. 33 g 0   torsemide (DEMADEX) 10 MG tablet Take 10 mg by mouth 3 (three) times a week. (Patient taking differently: Take 10 mg by mouth daily.)     No current facility-administered medications for this visit.     PHYSICAL EXAMINATION:  Vitals:   06/11/24 1042 06/11/24 1049  BP: (!) 156/73 (!) 154/71  Pulse: 65   Resp: 18   Temp: (!) 97 F (36.1 C)   SpO2: 95%    Filed Weights   06/11/24 1042  Weight: 148 lb 14.4 oz (67.5 kg)    Physical Exam Constitutional:      General: She is not in acute distress. HENT:     Head: Normocephalic and atraumatic.  Eyes:     General: No scleral icterus.    Pupils: Pupils are equal, round, and reactive to light.  Cardiovascular:     Rate and Rhythm: Normal rate and regular rhythm.     Heart sounds: Normal heart sounds.  Pulmonary:     Effort: Pulmonary effort is normal. No respiratory distress.  Abdominal:     General: Bowel sounds are normal. There is no distension.     Palpations: Abdomen is soft. There is no mass.     Tenderness: There is no abdominal tenderness.  Musculoskeletal:        General: No deformity. Normal range of motion.     Cervical back: Normal range of motion and neck supple.  Skin:    General: Skin is warm and dry.     Findings: No erythema or rash.  Neurological:     Mental Status: She is alert and oriented to person, place, and time.     Cranial Nerves: No cranial nerve deficit.     Coordination: Coordination normal.  Psychiatric:        Behavior: Behavior normal.        Thought Content: Thought content normal.         LABORATORY DATA:  I have reviewed the data as listed    Latest Ref Rng & Units 06/11/2024   10:23 AM 12/24/2023   11:24 AM 09/05/2023   12:46 PM  CBC  WBC 4.0 - 10.5 K/uL 6.1  5.6  6.3   Hemoglobin 12.0 - 15.0 g/dL 86.8  87.7  87.5    Hematocrit 36.0 - 46.0 % 41.7  38.0  39.8   Platelets 150 - 400 K/uL 181  192  218       Latest Ref Rng & Units 06/11/2024   10:24 AM 01/02/2024   11:22 AM 12/31/2023   11:58 AM  CMP  Glucose 70 - 99 mg/dL 898  898  99   BUN 8 - 23 mg/dL 49  52  59   Creatinine 0.44 - 1.00 mg/dL 8.25  8.28  8.22   Sodium 135 - 145 mmol/L 138  138  138   Potassium 3.5 - 5.1 mmol/L 4.6  4.3  4.5   Chloride 98 - 111 mmol/L 98  100  100   CO2 22 - 32 mmol/L 29  29  29    Calcium  8.9 - 10.3 mg/dL 9.2  9.5  9.8   Total Protein 6.5 - 8.1 g/dL 6.9   7.3   Total Bilirubin 0.0 - 1.2 mg/dL 0.5   0.4   Alkaline Phos 38 - 126 U/L 37   37   AST 15 - 41 U/L 21   16   ALT 0 - 44 U/L  20   14       RADIOGRAPHIC STUDIES: I have personally reviewed the radiological images as listed and agreed with the findings in the report.

## 2024-06-12 ENCOUNTER — Encounter: Payer: Self-pay | Admitting: Pharmacist

## 2024-06-12 NOTE — Progress Notes (Signed)
 Pharmacy Quality Measure Review  This patient is appearing on a report for being at risk of failing the adherence measure for hypertension (ACEi/ARB) medications this calendar year.   Medication: lisinopril  40 mg Last fill date: 12/19/23 for 90 day supply  Medication was held in the setting of hyperkalemia previously and was never resumed.

## 2024-06-22 ENCOUNTER — Ambulatory Visit (INDEPENDENT_AMBULATORY_CARE_PROVIDER_SITE_OTHER)

## 2024-06-22 DIAGNOSIS — I129 Hypertensive chronic kidney disease with stage 1 through stage 4 chronic kidney disease, or unspecified chronic kidney disease: Secondary | ICD-10-CM | POA: Diagnosis not present

## 2024-06-22 DIAGNOSIS — R809 Proteinuria, unspecified: Secondary | ICD-10-CM | POA: Diagnosis not present

## 2024-06-22 DIAGNOSIS — N184 Chronic kidney disease, stage 4 (severe): Secondary | ICD-10-CM | POA: Diagnosis not present

## 2024-06-22 DIAGNOSIS — D631 Anemia in chronic kidney disease: Secondary | ICD-10-CM | POA: Diagnosis not present

## 2024-06-22 DIAGNOSIS — N2581 Secondary hyperparathyroidism of renal origin: Secondary | ICD-10-CM | POA: Diagnosis not present

## 2024-06-22 DIAGNOSIS — E875 Hyperkalemia: Secondary | ICD-10-CM | POA: Diagnosis not present

## 2024-06-22 DIAGNOSIS — E538 Deficiency of other specified B group vitamins: Secondary | ICD-10-CM | POA: Diagnosis not present

## 2024-06-22 MED ORDER — CYANOCOBALAMIN 1000 MCG/ML IJ SOLN
1000.0000 ug | Freq: Once | INTRAMUSCULAR | Status: AC
  Administered 2024-06-22: 1000 ug via INTRAMUSCULAR

## 2024-06-22 NOTE — Progress Notes (Signed)
 Pt received B12 injection in Left  deltoid muscle. Pt tolerated it well with no complaints or concerns.

## 2024-06-25 ENCOUNTER — Encounter

## 2024-06-25 DIAGNOSIS — I129 Hypertensive chronic kidney disease with stage 1 through stage 4 chronic kidney disease, or unspecified chronic kidney disease: Secondary | ICD-10-CM | POA: Diagnosis not present

## 2024-06-25 DIAGNOSIS — D631 Anemia in chronic kidney disease: Secondary | ICD-10-CM | POA: Diagnosis not present

## 2024-06-25 DIAGNOSIS — N184 Chronic kidney disease, stage 4 (severe): Secondary | ICD-10-CM | POA: Diagnosis not present

## 2024-06-25 DIAGNOSIS — N2581 Secondary hyperparathyroidism of renal origin: Secondary | ICD-10-CM | POA: Diagnosis not present

## 2024-06-25 DIAGNOSIS — R809 Proteinuria, unspecified: Secondary | ICD-10-CM | POA: Diagnosis not present

## 2024-06-25 DIAGNOSIS — E875 Hyperkalemia: Secondary | ICD-10-CM | POA: Diagnosis not present

## 2024-06-26 ENCOUNTER — Encounter: Payer: Self-pay | Admitting: Pharmacist

## 2024-06-26 NOTE — Progress Notes (Signed)
 Pharmacy Quality Measure Review  This patient is appearing on a report for being at risk of failing the adherence measure for hypertension (ACEi/ARB) medications this calendar year.   Medication: lisinopril  20 mg Last fill date: 12/19/23 for 90 day supply  Patient is no longer prescribed lisinopril . Has been removed from her med list. No further action at this time. Will continue to appear on adherence measure list.

## 2024-07-02 ENCOUNTER — Inpatient Hospital Stay: Attending: Oncology

## 2024-07-02 ENCOUNTER — Inpatient Hospital Stay

## 2024-07-02 DIAGNOSIS — Z853 Personal history of malignant neoplasm of breast: Secondary | ICD-10-CM | POA: Insufficient documentation

## 2024-07-02 DIAGNOSIS — N1832 Chronic kidney disease, stage 3b: Secondary | ICD-10-CM | POA: Diagnosis not present

## 2024-07-02 DIAGNOSIS — M810 Age-related osteoporosis without current pathological fracture: Secondary | ICD-10-CM | POA: Insufficient documentation

## 2024-07-02 DIAGNOSIS — Z170 Estrogen receptor positive status [ER+]: Secondary | ICD-10-CM

## 2024-07-02 LAB — BASIC METABOLIC PANEL - CANCER CENTER ONLY
Anion gap: 10 (ref 5–15)
BUN: 42 mg/dL — ABNORMAL HIGH (ref 8–23)
CO2: 30 mmol/L (ref 22–32)
Calcium: 9.3 mg/dL (ref 8.9–10.3)
Chloride: 99 mmol/L (ref 98–111)
Creatinine: 1.66 mg/dL — ABNORMAL HIGH (ref 0.44–1.00)
GFR, Estimated: 31 mL/min — ABNORMAL LOW (ref >60)
Glucose, Bld: 104 mg/dL — ABNORMAL HIGH (ref 70–99)
Potassium: 4.3 mmol/L (ref 3.5–5.1)
Sodium: 139 mmol/L (ref 135–145)

## 2024-07-02 MED ORDER — DENOSUMAB 60 MG/ML ~~LOC~~ SOSY
60.0000 mg | PREFILLED_SYRINGE | Freq: Once | SUBCUTANEOUS | Status: AC
  Administered 2024-07-02: 60 mg via SUBCUTANEOUS
  Filled 2024-07-02: qty 1

## 2024-07-06 ENCOUNTER — Ambulatory Visit: Admitting: Surgery

## 2024-07-08 ENCOUNTER — Ambulatory Visit
Admission: RE | Admit: 2024-07-08 | Discharge: 2024-07-08 | Disposition: A | Discharge status: Home / Self Care | Source: Ambulatory Visit | Attending: Surgery | Admitting: Surgery

## 2024-07-08 DIAGNOSIS — Z1231 Encounter for screening mammogram for malignant neoplasm of breast: Secondary | ICD-10-CM | POA: Diagnosis not present

## 2024-07-15 ENCOUNTER — Ambulatory Visit: Admitting: Surgery

## 2024-07-15 ENCOUNTER — Encounter: Payer: Self-pay | Admitting: Surgery

## 2024-07-15 VITALS — BP 145/80 | HR 78 | Temp 98.6°F | Ht 62.0 in | Wt 149.0 lb

## 2024-07-15 DIAGNOSIS — Z08 Encounter for follow-up examination after completed treatment for malignant neoplasm: Secondary | ICD-10-CM | POA: Diagnosis not present

## 2024-07-15 DIAGNOSIS — Z853 Personal history of malignant neoplasm of breast: Secondary | ICD-10-CM | POA: Diagnosis not present

## 2024-07-15 DIAGNOSIS — Z1231 Encounter for screening mammogram for malignant neoplasm of breast: Secondary | ICD-10-CM

## 2024-07-15 NOTE — Patient Instructions (Signed)
 How to Do a Breast Self-Exam Doing breast self-exams can help you stay healthy. They're one way to know what's normal for your breasts. They can help you catch a problem while it's still small and can be treated. You need to: Check your breasts often. Tell your doctor about any changes. You should do breast self-exams even if you have breast implants. What you need: A mirror. A well-lit room. A pillow or other soft object. How to do a breast self-exam Look for changes  Take off all the clothes above your waist. Stand in front of a mirror in a room with good lighting. Put your hands down at your sides. Compare your breasts in the mirror. Look for difference between them, such as: Differences in shape. Differences in size. Wrinkles, dips, and bumps in one breast and not the other. Look at each breast for skin changes, such as: Redness. Scaly spots. Spots where your skin is thicker. Dimpling. Open sores. Look for changes in your nipples, such as: Fluid coming out of a nipple. Fluid around a nipple. Bleeding. Dimpling. Redness. A nipple that looks pushed in or that has changed position. Feel for changes Lie on your back. Feel each breast. To do this: Pick a breast to feel. Place a pillow under the shoulder closest to that breast. Put the arm closest to that breast behind your head. Feel the breast using the hand of your other arm. Use the pads of your three middle fingers to make small circles starting near the nipple. Use light, medium, and firm pressure. Keep making circles, moving down over the breast. Stop when you feel your ribs. Start making circles with your fingers again, this time going up until you reach your collarbone. Then, make circles out across your breast and into your armpit area. Squeeze your nipple. Check for fluid and lumps. Do these steps again to check your other breast. Sit or stand in the tub or shower. With soapy water on your skin, feel each breast  the same way you did when you were lying down. Write down what you find Writing down what you find can help you keep track of what you want to tell your doctor. Write down: What's normal for each breast. Any changes you find. Write down: The kind of change. If your breast feels tender or painful. Any lump you find. Write down its size and where it is. When you last had your period. General tips If you're breastfeeding, the best time to check your breasts is after you feed your baby or after you use a breast pump. If you get a period, the best time to check your breasts is 5-7 days after your period ends. With time, you'll get more used to doing the self-exam. You'll also start to know if there are changes in your breasts. Contact a doctor if: You see a change in the shape or size of your breasts or nipples. You see a change in the skin of your breast or nipples. You have fluid coming from your nipples that isn't normal. You find a new lump or thick area. You have breast pain. You have any concerns about your breast health. This information is not intended to replace advice given to you by your health care provider. Make sure you discuss any questions you have with your health care provider. Document Revised: 11/27/2023 Document Reviewed: 11/27/2023 Elsevier Patient Education  2025 [employer].

## 2024-07-15 NOTE — Progress Notes (Signed)
 Outpatient Surgical Follow Up  07/15/2024  Tammy Choi is an 78 y.o. female.   Chief Complaint  Patient presents with   Follow-up    HPI: Tammy Choi is a 78 year old female with a history of right breast cancer Invasive mammary ER + PR - HER Neg status post lumpectomy and radiation therapy in 2019 by Tammy. Dessa. underwent XRT.  She denies any breast lumps.  No breast discharge.  No fevers no chills no weight loss.  she did have a recent mammo that I have personally reviewed showing no evidence of concerning lesions.She has some intermittent Right breast pain, dull, worsening with pressure She dose have CKD but not on HD, f/u nephrology  Past Medical History:  Diagnosis Date   Arthritis    Breast cancer (HCC) 05/07/2018   T1a, N0; ER/ PR positive, Her 2 neu not overexpressed.    COPD (chronic obstructive pulmonary disease) (HCC)    Not on home o2   Depression    HTN (hypertension)    Hyperlipemia    Osteoporosis 07/28/2020   Personal history of radiation therapy     Past Surgical History:  Procedure Laterality Date   ABDOMINAL HYSTERECTOMY     BREAST BIOPSY Left 04/30/2013   neg core   BREAST BIOPSY Right 05/07/2018   right breast stereo x clip INVASIVE MAMMARY CARCINOMA   BREAST LUMPECTOMY Right 2019   BREAST LUMPECTOMY WITH SENTINEL LYMPH NODE BIOPSY Right 06/16/2018   Procedure: BREAST LUMPECTOMY WITH SENTINEL LYMPH NODE BX;  Surgeon: Tammy Choi ORN, Choi;  Location: [facility] ORS;  Service: General;  Laterality: Right;   COLONOSCOPY  2014   Tammy Choi   Left Leg Surgery     TONSILLECTOMY     TOTAL HIP ARTHROPLASTY Right 03/29/2020   Procedure: TOTAL HIP ARTHROPLASTY;  Surgeon: Tammy Choi;  Location: [facility] ORS;  Service: Orthopedics;  Laterality: Right;    Family History  Problem Relation Age of Onset   Kidney disease Mother        deceased 70   Heart disease Father        deceased 53   Breast cancer Other 72       maternal half-sister; deceased 26    Colon cancer Neg Hx     Social History:  reports that she quit smoking about 8 years ago. Her smoking use included cigarettes. She started smoking about 43 years ago. She has a 35 pack-year smoking history. She has never used smokeless tobacco. She reports that she does not drink alcohol and does not use drugs.  Allergies:  Allergies  Allergen Reactions   Penicillins Anaphylaxis and Other (See Comments)    Has patient had a PCN reaction causing immediate rash, facial/tongue/throat swelling, SOB or lightheadedness with hypotension: Yes Has patient had a PCN reaction causing severe rash involving mucus membranes or skin necrosis: No Has patient had a PCN reaction that required hospitalization No Has patient had a PCN reaction occurring within the last 10 years: No If all of the above answers are NO, then may proceed with Cephalosporin use.    Medications reviewed.    ROS Full ROS performed and is otherwise negative other than what is stated in HPI   BP (!) 145/80   Pulse 78   Temp 98.6 F (37 C) (Oral)   Ht 5' 2 (1.575 m)   Wt 149 lb (67.6 kg)   SpO2 98%   BMI 27.25 kg/m   Physical Exam  Vitals and nursing  note reviewed. Exam conducted with a chaperone present.  Constitutional:      General: She is not in acute distress.    Appearance: Normal appearance. She is normal weight.  Eyes:     General: No scleral icterus.       Right eye: No discharge.        Left eye: No discharge.  Neck:     Vascular: No carotid bruit.  Cardiovascular:     Rate and Rhythm: Normal rate and regular rhythm.  Pulmonary:     Effort: Pulmonary effort is normal. No respiratory distress.     Breath sounds: Normal breath sounds. No stridor.     Comments: BREAST: Right lumpectomy scar and sentinel lymph node biopsy scar. Chronic radiation changes. There is no evidence of new masses there is no evidence of skin ulcerations.  There are no evidence of axillary lymphadenopathy.   Abdominal:      General: Abdomen is flat. There is no distension.     Palpations: Abdomen is soft. There is no mass.     Tenderness: There is no abdominal tenderness. There is no right CVA tenderness, guarding or rebound.     Hernia: No hernia is present.  Musculoskeletal:     Cervical back: Normal range of motion and neck supple. No rigidity or tenderness.  Lymphadenopathy:     Cervical: No cervical adenopathy.  neurological:     General: No focal deficit present.     Mental Status: She is alert and oriented to person, place, and time.  Psychiatric:        Mood and Affect: Mood normal.        Behavior: Behavior normal.        Thought Content: Thought content normal.        Judgment: Judgment normal.      Assessment/Plan: 78 year old female history of Invasive breast cancer without evidence of recurrence.  Some chronic right discomfort, no need for intervention.No need for further test or biopsies at this time.  We will continue yearly f/u per her wishes.  I personally spent a total of 30 minutes in the care of the patient today including performing a medically appropriate exam/evaluation, counseling and educating, placing orders, referring and communicating with other health care professionals, documenting clinical information in the EHR, independently interpreting and reviewing images studies and coordinating care.   Tammy Luna, [facility] General Surgeon

## 2024-07-22 ENCOUNTER — Ambulatory Visit

## 2024-07-22 DIAGNOSIS — E538 Deficiency of other specified B group vitamins: Secondary | ICD-10-CM

## 2024-07-22 MED ORDER — CYANOCOBALAMIN 1000 MCG/ML IJ SOLN
1000.0000 ug | Freq: Once | INTRAMUSCULAR | Status: AC
  Administered 2024-07-22: 1000 ug via INTRAMUSCULAR

## 2024-07-22 NOTE — Progress Notes (Signed)
 Patient was administered a B12 injection into her left deltoid. Patient tolerated the B12 injection well.

## 2024-07-30 ENCOUNTER — Ambulatory Visit

## 2024-07-30 ENCOUNTER — Ambulatory Visit: Payer: Self-pay

## 2024-07-30 VITALS — BP 124/78 | HR 77 | Temp 98.3°F | Ht 63.0 in | Wt 149.8 lb

## 2024-07-30 DIAGNOSIS — E782 Mixed hyperlipidemia: Secondary | ICD-10-CM | POA: Diagnosis not present

## 2024-07-30 DIAGNOSIS — R062 Wheezing: Secondary | ICD-10-CM

## 2024-07-30 DIAGNOSIS — F419 Anxiety disorder, unspecified: Secondary | ICD-10-CM

## 2024-07-30 DIAGNOSIS — Z23 Encounter for immunization: Secondary | ICD-10-CM | POA: Diagnosis not present

## 2024-07-30 DIAGNOSIS — N184 Chronic kidney disease, stage 4 (severe): Secondary | ICD-10-CM

## 2024-07-30 DIAGNOSIS — I440 Atrioventricular block, first degree: Secondary | ICD-10-CM

## 2024-07-30 DIAGNOSIS — M818 Other osteoporosis without current pathological fracture: Secondary | ICD-10-CM

## 2024-07-30 DIAGNOSIS — R42 Dizziness and giddiness: Secondary | ICD-10-CM

## 2024-07-30 DIAGNOSIS — I10 Essential (primary) hypertension: Secondary | ICD-10-CM

## 2024-07-30 DIAGNOSIS — F331 Major depressive disorder, recurrent, moderate: Secondary | ICD-10-CM | POA: Insufficient documentation

## 2024-07-30 DIAGNOSIS — F39 Unspecified mood [affective] disorder: Secondary | ICD-10-CM | POA: Diagnosis not present

## 2024-07-30 DIAGNOSIS — R002 Palpitations: Secondary | ICD-10-CM | POA: Diagnosis not present

## 2024-07-30 DIAGNOSIS — Z760 Encounter for issue of repeat prescription: Secondary | ICD-10-CM

## 2024-07-30 DIAGNOSIS — J449 Chronic obstructive pulmonary disease, unspecified: Secondary | ICD-10-CM

## 2024-07-30 DIAGNOSIS — R634 Abnormal weight loss: Secondary | ICD-10-CM

## 2024-07-30 DIAGNOSIS — R7303 Prediabetes: Secondary | ICD-10-CM

## 2024-07-30 DIAGNOSIS — K5730 Diverticulosis of large intestine without perforation or abscess without bleeding: Secondary | ICD-10-CM

## 2024-07-30 DIAGNOSIS — E785 Hyperlipidemia, unspecified: Secondary | ICD-10-CM

## 2024-07-30 HISTORY — DX: Wheezing: R06.2

## 2024-07-30 LAB — CBC WITH DIFFERENTIAL/PLATELET
Basophils Absolute: 0 K/uL (ref 0.0–0.1)
Basophils Relative: 0.6 % (ref 0.0–3.0)
Eosinophils Absolute: 0.1 K/uL (ref 0.0–0.7)
Eosinophils Relative: 1.1 % (ref 0.0–5.0)
HCT: 40.6 % (ref 36.0–46.0)
Hemoglobin: 13.2 g/dL (ref 12.0–15.0)
Lymphocytes Relative: 36.2 % (ref 12.0–46.0)
Lymphs Abs: 1.9 K/uL (ref 0.7–4.0)
MCHC: 32.6 g/dL (ref 30.0–36.0)
MCV: 87 fl (ref 78.0–100.0)
Monocytes Absolute: 0.4 K/uL (ref 0.1–1.0)
Monocytes Relative: 7.8 % (ref 3.0–12.0)
Neutro Abs: 2.8 K/uL (ref 1.4–7.7)
Neutrophils Relative %: 54.3 % (ref 43.0–77.0)
Platelets: 185 K/uL (ref 150.0–400.0)
RBC: 4.67 Mil/uL (ref 3.87–5.11)
RDW: 15.4 % (ref 11.5–15.5)
WBC: 5.2 K/uL (ref 4.0–10.5)

## 2024-07-30 LAB — BASIC METABOLIC PANEL WITH GFR
BUN: 58 mg/dL — ABNORMAL HIGH (ref 6–23)
CO2: 31 meq/L (ref 19–32)
Calcium: 8.3 mg/dL — ABNORMAL LOW (ref 8.4–10.5)
Chloride: 98 meq/L (ref 96–112)
Creatinine, Ser: 1.96 mg/dL — ABNORMAL HIGH (ref 0.40–1.20)
GFR: 23.97 mL/min — ABNORMAL LOW (ref >60.00)
Glucose, Bld: 93 mg/dL (ref 70–99)
Potassium: 4 meq/L (ref 3.5–5.1)
Sodium: 140 meq/L (ref 135–145)

## 2024-07-30 LAB — BRAIN NATRIURETIC PEPTIDE: Pro B Natriuretic peptide (BNP): 64 pg/mL (ref 0.0–100.0)

## 2024-07-30 MED ORDER — ALBUTEROL SULFATE HFA 108 (90 BASE) MCG/ACT IN AERS: 2.0000 | INHALATION_SPRAY | Freq: Four times a day (QID) | RESPIRATORY_TRACT | 0 refills | Status: DC | PRN

## 2024-07-30 MED ORDER — BUSPIRONE HCL 5 MG PO TABS
5.0000 mg | ORAL_TABLET | Freq: Two times a day (BID) | ORAL | 1 refills | Status: AC
Start: 2024-07-30 — End: ?

## 2024-07-30 MED ORDER — CITALOPRAM HYDROBROMIDE 20 MG PO TABS: 20.0000 mg | ORAL_TABLET | Freq: Every day | ORAL | 3 refills | Status: AC

## 2024-07-30 MED ORDER — ROSUVASTATIN CALCIUM 5 MG PO TABS: 5.0000 mg | ORAL_TABLET | Freq: Every day | ORAL | 3 refills | Status: AC

## 2024-07-30 MED ORDER — AMLODIPINE BESYLATE 2.5 MG PO TABS: 2.5000 mg | ORAL_TABLET | Freq: Every day | ORAL | 1 refills | Status: DC

## 2024-07-30 MED ORDER — BREZTRI AEROSPHERE 160-9-4.8 MCG/ACT IN AERO: 2.0000 | INHALATION_SPRAY | Freq: Two times a day (BID) | RESPIRATORY_TRACT | Status: AC

## 2024-07-30 NOTE — Assessment & Plan Note (Deleted)
 Plan per hypertension Orders:   EKG 12-Lead   Basic Metabolic Panel (BMET)   Ambulatory referral to Cardiology

## 2024-07-30 NOTE — Assessment & Plan Note (Addendum)
 Chronic. Continue Crestor  5 mg daily. Orders:   rosuvastatin  (CRESTOR ) 5 MG tablet; Take 1 tablet (5 mg total) by mouth daily.

## 2024-07-30 NOTE — Assessment & Plan Note (Addendum)
 No SI, HI. Mood stable. Continue citalopram  20 mg daily. Take Buspar  5 mg twice daily as needed. Refill prescriptions for citalopram  and Buspar . Orders:   busPIRone  (BUSPAR ) 5 MG tablet; Take 1 tablet (5 mg total) by mouth 2 (two) times daily.   citalopram  (CELEXA ) 20 MG tablet; Take 1 tablet (20 mg total) by mouth daily.

## 2024-07-30 NOTE — Assessment & Plan Note (Addendum)
 Updated today.  Orders:   Flu vaccine HIGH DOSE PF(Fluzone Trivalent)

## 2024-07-30 NOTE — Assessment & Plan Note (Addendum)
 Noted on CT abdomen and pelvis in 11/2023. Advise high fiber diet to prevent complications. Provide educational materials on fiber-rich foods and diverticulosis.

## 2024-07-30 NOTE — Progress Notes (Signed)
 Established Patient Office Visit   Subjective  Patient ID: Tammy Choi, female    DOB: 1945-12-03  Age: 78 y.o. MRN: [id number]  Chief Complaint  Patient presents with   Establish Care   Wheezing   Breathing Problem    Discussed the use of AI scribe software for clinical note transcription with the patient, who gave verbal consent to proceed.  History of Present Illness Tammy Choi is a 78 year old female with COPD (h/o smoking quit about 7 yrs ago), CKD (seeing Dr. Douglas), hyperlipidemia, hypertension, right breast invasive mammary cancer, s/p lumpectomy/radiation 2019 following up with Dr. Babara presents for transfer of care from previous PCP and has multiple concerns.   - She has been experiencing dizziness, palpitations in the morning since starting amlodipine  5 mg daily, which began 3-4 days ago. Previously was on Lisinopril , discontinued due to hyperkalemia. Her blood pressure has improved since switching from to amlodipine . She has CKD IV and secondary complications from this. She sees Dr. Douglas for this.   - She has a history of COPD and currently uses Symbicort  twice daily, which she feels is less effective than before. She also has albuterol  available but does not use it regularly. She reports increased mucus production and occasional wheezing, which she manages with Symbicort . She recalls getting PFT few years ago. She is not seeing a pulmonologist at this time. She denies fever, lower leg edema. She quit smoking 6-7 years ago. Qualifies for low dose CT for lung cancer screening but would like to hold off on it for now.   - She notes a sore spot on left leg. Does not recall trauma to the leg.   - She has a history of vitamin B12 deficiency, for which she receives regular injections, and takes vitamin D  daily.   - She  takes Buspar  5 mg one to two a day as needed, usually once daily, and Celexa  20 mg daily for mood.   - She has h/o right breast cancer, s/p  lumpectomy. Sees Dr. Babara and is treated with Prolia  for osteoporosis. Her mammogram was normal in 07/2024.   - Noted to have diverticulosis on CT abdomen and pelvis from 11/2023. No abdominal pain.       ROS As per HPI    Objective:     BP 124/78 (BP Location: Left Arm, Patient Position: Sitting, Cuff Size: Normal)   Pulse 77   Temp 98.3 F (36.8 C) (Oral)   Ht 5' 3 (1.6 m)   Wt 149 lb 12.8 oz (67.9 kg)   SpO2 (!) 89%   BMI 26.54 kg/m      07/30/2024   11:22 AM 03/25/2024   10:12 AM 12/31/2023   11:18 AM  Depression screen PHQ 2/9  Decreased Interest 0 0 0  Down, Depressed, Hopeless 0 0 0  PHQ - 2 Score 0 0 0  Altered sleeping 0 0 0  Tired, decreased energy 0 0 0  Change in appetite 0 0 0  Feeling bad or failure about yourself  0 0 0  Trouble concentrating 0 0 0  Moving slowly or fidgety/restless 0 0 0  Suicidal thoughts 0 0 0  PHQ-9 Score 0 0 0  Difficult doing work/chores Not difficult at all Not difficult at all Not difficult at all      07/30/2024   11:22 AM 12/31/2023   11:18 AM 11/25/2023   10:07 AM 08/02/2023   11:35 AM  GAD 7 : Generalized Anxiety  Score  Nervous, Anxious, on Edge 0 0 0 0  Control/stop worrying 0 0 0 0  Worry too much - different things 0 0 0 0  Trouble relaxing 0 0 0 0  Restless 0 0 0 0  Easily annoyed or irritable 0 0 0 0  Afraid - awful might happen 0 0 0 0  Total GAD 7 Score 0 0 0 0  Anxiety Difficulty Not difficult at all Not difficult at all Not difficult at all Not difficult at all      07/30/2024   11:22 AM 03/25/2024   10:12 AM 12/31/2023   11:18 AM  Depression screen PHQ 2/9  Decreased Interest 0 0 0  Down, Depressed, Hopeless 0 0 0  PHQ - 2 Score 0 0 0  Altered sleeping 0 0 0  Tired, decreased energy 0 0 0  Change in appetite 0 0 0  Feeling bad or failure about yourself  0 0 0  Trouble concentrating 0 0 0  Moving slowly or fidgety/restless 0 0 0  Suicidal thoughts 0 0 0  PHQ-9 Score 0 0 0  Difficult doing work/chores  Not difficult at all Not difficult at all Not difficult at all      07/30/2024   11:22 AM 12/31/2023   11:18 AM 11/25/2023   10:07 AM 08/02/2023   11:35 AM  GAD 7 : Generalized Anxiety Score  Nervous, Anxious, on Edge 0 0 0 0  Control/stop worrying 0 0 0 0  Worry too much - different things 0 0 0 0  Trouble relaxing 0 0 0 0  Restless 0 0 0 0  Easily annoyed or irritable 0 0 0 0  Afraid - awful might happen 0 0 0 0  Total GAD 7 Score 0 0 0 0  Anxiety Difficulty Not difficult at all Not difficult at all Not difficult at all Not difficult at all   SDOH Screenings   Food Insecurity: No Food Insecurity (03/24/2024)  Housing: Low Risk  (03/24/2024)  Transportation Needs: No Transportation Needs (03/24/2024)  Utilities: Not At Risk (03/25/2024)  Alcohol Screen: Low Risk  (03/24/2024)  Depression (PHQ2-9): Low Risk  (07/30/2024)  Financial Resource Strain: Medium Risk (03/24/2024)  Physical Activity: Insufficiently Active (03/24/2024)  Social Connections: Moderately Isolated (03/24/2024)  Stress: No Stress Concern Present (03/24/2024)  Tobacco Use: Medium Risk (07/30/2024)  Health Literacy: Adequate Health Literacy (03/25/2024)     Physical Exam Constitutional:      General: She is not in acute distress. HENT:     Right Ear: Tympanic membrane normal.     Left Ear: Tympanic membrane normal.     Mouth/Throat:     Mouth: Mucous membranes are moist.  Cardiovascular:     Rate and Rhythm: Normal rate and regular rhythm.  Pulmonary:     Breath sounds: Wheezing (bilateral wheezing) present. No rales.  Abdominal:     General: Bowel sounds are normal.     Palpations: Abdomen is soft.     Tenderness: There is no guarding.  Musculoskeletal:     Right lower leg: No edema.     Left lower leg: No edema.     Comments: Mild erythema of left lower leg without discharge, swelling   Lymphadenopathy:     Cervical: No cervical adenopathy.  Neurological:     Mental Status: She is alert.     Gait: Gait  abnormal (slow gait).  Psychiatric:        Mood and Affect: Mood normal.  Results for orders placed or performed in visit on 07/30/24  Basic Metabolic Panel (BMET)  Result Value Ref Range   Sodium 140 135 - 145 mEq/L   Potassium 4.0 3.5 - 5.1 mEq/L   Chloride 98 96 - 112 mEq/L   CO2 31 19 - 32 mEq/L   Glucose, Bld 93 70 - 99 mg/dL   BUN 58 (H) 6 - 23 mg/dL   Creatinine, Ser 8.03 (H) 0.40 - 1.20 mg/dL   GFR 76.02 (L) >39.99 mL/min   Calcium  8.3 (L) 8.4 - 10.5 mg/dL  CBC w/Diff  Result Value Ref Range   WBC 5.2 4.0 - 10.5 K/uL   RBC 4.67 3.87 - 5.11 Mil/uL   Hemoglobin 13.2 12.0 - 15.0 g/dL   HCT 59.3 63.9 - 53.9 %   MCV 87.0 78.0 - 100.0 fl   MCHC 32.6 30.0 - 36.0 g/dL   RDW 84.5 88.4 - 84.4 %   Platelets 185.0 150.0 - 400.0 K/uL   Neutrophils Relative % 54.3 43.0 - 77.0 %   Lymphocytes Relative 36.2 12.0 - 46.0 %   Monocytes Relative 7.8 3.0 - 12.0 %   Eosinophils Relative 1.1 0.0 - 5.0 %   Basophils Relative 0.6 0.0 - 3.0 %   Neutro Abs 2.8 1.4 - 7.7 K/uL   Lymphs Abs 1.9 0.7 - 4.0 K/uL   Monocytes Absolute 0.4 0.1 - 1.0 K/uL   Eosinophils Absolute 0.1 0.0 - 0.7 K/uL   Basophils Absolute 0.0 0.0 - 0.1 K/uL  B Nat Peptide  Result Value Ref Range   Pro B Natriuretic peptide (BNP) 64.0 0.0 - 100.0 pg/mL    The 10-year ASCVD risk score (Arnett DK, et al., 2019) is: 30.9%     Assessment & Plan:   Assessment & Plan Chronic obstructive pulmonary disease, unspecified COPD type (HCC) COPD symptoms inadequately controlled with Symbicort . Wheezing and increased mucus production noted per patient.  Given wheezing check BNP, CBC.  - Refer to pulmonology for re-evaluation and pulmonary function test. - Discontinue Symbicort , start Breztri 2 puffs twice daily, sample for 2 inhalers given during today's visit.  - Ensure mouth rinse after inhaler use. - Prescribe albuterol  inhaler for chest tightness or wheezing. - Qualifies for lung cancer screening with low dose CT.  Patient declined this today.   Orders:   budesonide -glycopyrrolate -formoterol  (BREZTRI AEROSPHERE) 160-9-4.8 MCG/ACT AERO inhaler; Inhale 2 puffs into the lungs 2 (two) times daily.   CBC w/Diff   Ambulatory referral to Pulmonology  Mood disorder No SI, HI. Mood stable. Continue citalopram  20 mg daily. Take Buspar  5 mg twice daily as needed. Refill prescriptions for citalopram  and Buspar . Orders:   busPIRone  (BUSPAR ) 5 MG tablet; Take 1 tablet (5 mg total) by mouth 2 (two) times daily.   citalopram  (CELEXA ) 20 MG tablet; Take 1 tablet (20 mg total) by mouth daily.  Mixed hyperlipidemia Chronic. Continue Crestor  5 mg daily. Orders:   rosuvastatin  (CRESTOR ) 5 MG tablet; Take 1 tablet (5 mg total) by mouth daily.  Primary hypertension Chronic. Previously treated with Lisinopril  which was discontinued due to hyperkalemia. On Torsemide 10 mg daily which she takes at night as well. Recently started on Amlodipine  5 mg daily which has caused morning dizziness, palpitations. Reduce Amlodipine  to 2.5 mg once daily. EKG in the clinic shows sinus rhythm, first degree av block, right axis deviation likely secondary to COPD. Cardiology referral done for further evaluation including need for echocardiogram.   Orders:   Ambulatory referral to Cardiology  amLODipine  (NORVASC ) 2.5 MG tablet; Take 1 tablet (2.5 mg total) by mouth daily.  Diverticulosis of colon Noted on CT abdomen and pelvis in 11/2023. Advise high fiber diet to prevent complications. Provide educational materials on fiber-rich foods and diverticulosis.  Chronic kidney disease, stage IV (severe) (HCC) Stage 4 CKD.  Current management includes torsemide 10 mg daily, amlodipine  5 mg (reduced to 2.5 mg during today's visit) and dietary modifications. Recommend taking Torsemide in AM and Amlodipine  in pm Continue close follow up with nephrologist Dr. Douglas.     Wheezing Plan per COPD  Orders:   Ambulatory referral to Cardiology   B  Nat Peptide  AV block, 1st degree Noted on 12-lead EKG today. Amlodipine  dose reduced from 5 mg to 2.5 mg daily. Referral to cardiology made due to morning dizziness, palpitations.  Orders:   Ambulatory referral to Cardiology  Need for influenza vaccination Updated today.  Orders:   Flu vaccine HIGH DOSE PF(Fluzone Trivalent)  Other osteoporosis without current pathological fracture  Managed with Prolia  injections. Continue      Dizziness Mostly in the mornings,  noticeable since starting Amlodipine  5 mg about 3-4 days ago. Recommendations per plan from hypertension. I will update Dr. Douglas on dose reduction and referral to cardiology as well.  Orders:   EKG 12-Lead   Basic Metabolic Panel (BMET)   Ambulatory referral to Cardiology  I personally spent a total of 50 minutes in the care of the patient today including preparing to see the patient, performing a medically appropriate exam/evaluation, counseling and educating, placing orders, referring and communicating with other health care professionals, documenting clinical information in the EHR, independently interpreting results, and communicating results.   Return in about 3 months (around 10/30/2024) for Chronic follow up .   Luke Shade, MD

## 2024-07-30 NOTE — Patient Instructions (Addendum)
 Encourage daily fiber intake of about 24 grams per day.   Use Breztri 2 puffs twice daily. Rinse mouth after each use. Referral to pulmonology for further evaluation and management of COPD. Use Albuterol  inhaler every 6-8 hourly as needed for wheezing, cough.   Reduce amlodipine  from 5 mg daily to 2.5 mg daily. New prescription sent. Referral to cardiology made today for abnormal EKG.   Use heating pad to left lower leg and reach out if pain worsens.   Follow up in 3 months.

## 2024-07-30 NOTE — Assessment & Plan Note (Addendum)
 Managed with Prolia  injections. Continue

## 2024-07-30 NOTE — Assessment & Plan Note (Deleted)
 SABRA

## 2024-07-30 NOTE — Assessment & Plan Note (Addendum)
 Stage 4 CKD.  Current management includes torsemide 10 mg daily, amlodipine  5 mg (reduced to 2.5 mg during today's visit) and dietary modifications. Recommend taking Torsemide in AM and Amlodipine  in pm Continue close follow up with nephrologist Dr. Douglas.

## 2024-07-30 NOTE — Progress Notes (Signed)
 Result reviewed with the patient during OV. Cardiology referral made.   Luke Shade, MD

## 2024-07-30 NOTE — Progress Notes (Signed)
 Medication Samples have been provided to the patient.  Drug name: Office Manager       Strength: 160mg /36mcg/4.8mcg        Qty: 2  LOT: [id number]  Exp.Date: 08/2026  Dosing instructions: Use Breztri 2 puffs twice daily. Rinse mouth after each use.   The patient has been instructed regarding the correct time, dose, and frequency of taking this medication, including desired effects and most common side effects.   Tammy Choi 12:03 PM 07/30/2024

## 2024-07-30 NOTE — Assessment & Plan Note (Addendum)
 COPD symptoms inadequately controlled with Symbicort . Wheezing and increased mucus production noted per patient.  Given wheezing check BNP, CBC.  - Refer to pulmonology for re-evaluation and pulmonary function test. - Discontinue Symbicort , start Breztri 2 puffs twice daily, sample for 2 inhalers given during today's visit.  - Ensure mouth rinse after inhaler use. - Prescribe albuterol  inhaler for chest tightness or wheezing. - Qualifies for lung cancer screening with low dose CT. Patient declined this today.   Orders:   budesonide -glycopyrrolate -formoterol  (BREZTRI AEROSPHERE) 160-9-4.8 MCG/ACT AERO inhaler; Inhale 2 puffs into the lungs 2 (two) times daily.   CBC w/Diff   Ambulatory referral to Pulmonology

## 2024-07-30 NOTE — Assessment & Plan Note (Addendum)
 Chronic. Previously treated with Lisinopril  which was discontinued due to hyperkalemia. On Torsemide 10 mg daily which she takes at night as well. Recently started on Amlodipine  5 mg daily which has caused morning dizziness, palpitations. Reduce Amlodipine  to 2.5 mg once daily. EKG in the clinic shows sinus rhythm, first degree av block, right axis deviation likely secondary to COPD. Cardiology referral done for further evaluation including need for echocardiogram.   Orders:   Ambulatory referral to Cardiology   amLODipine  (NORVASC ) 2.5 MG tablet; Take 1 tablet (2.5 mg total) by mouth daily.

## 2024-07-30 NOTE — Assessment & Plan Note (Addendum)
 Plan per COPD  Orders:   Ambulatory referral to Cardiology   B Nat Peptide

## 2024-07-30 NOTE — Assessment & Plan Note (Deleted)
 Tammy Choi

## 2024-07-30 NOTE — Assessment & Plan Note (Addendum)
 Noted on 12-lead EKG today. Amlodipine  dose reduced from 5 mg to 2.5 mg daily. Referral to cardiology made due to morning dizziness, palpitations.  Orders:   Ambulatory referral to Cardiology

## 2024-07-31 DIAGNOSIS — R42 Dizziness and giddiness: Secondary | ICD-10-CM | POA: Insufficient documentation

## 2024-07-31 NOTE — Assessment & Plan Note (Signed)
 Mostly in the mornings,  noticeable since starting Amlodipine  5 mg about 3-4 days ago. Recommendations per plan from hypertension. I will update Dr. Douglas on dose reduction and referral to cardiology as well.  Orders:   EKG 12-Lead   Basic Metabolic Panel (BMET)   Ambulatory referral to Cardiology

## 2024-07-31 NOTE — Progress Notes (Signed)
 Please let the patient know I reviewed her recent lab results. Her kidney function is slightly worse compared to 4 weeks ago. Her Potassium is normal. Calcium  is slightly lower than baseline. CBC is normal. BNP is normal. Recommend staying hydrated with about 40 oz of water daily, not escaping meals. I also recommend repeat labs next week to make sure her kidney function is normalizing. I have ordered future labs.   Thank you,  Luke Shade, MD

## 2024-08-21 ENCOUNTER — Other Ambulatory Visit: Payer: Self-pay

## 2024-08-21 ENCOUNTER — Telehealth: Payer: Self-pay | Admitting: Oncology

## 2024-08-21 NOTE — Telephone Encounter (Signed)
 Spoke with pt regarding scheduling of her DEXA in December. Pt agreed on transferring phone call to [place] so she could schedule appt. Call transferred.

## 2024-08-24 ENCOUNTER — Ambulatory Visit

## 2024-08-26 ENCOUNTER — Encounter: Payer: Self-pay | Admitting: Cardiology

## 2024-08-26 ENCOUNTER — Ambulatory Visit: Attending: Cardiology | Admitting: Cardiology

## 2024-08-26 VITALS — BP 118/60 | HR 69 | Ht 63.0 in | Wt 153.4 lb

## 2024-08-26 DIAGNOSIS — I10 Essential (primary) hypertension: Secondary | ICD-10-CM | POA: Diagnosis not present

## 2024-08-26 DIAGNOSIS — R072 Precordial pain: Secondary | ICD-10-CM | POA: Diagnosis not present

## 2024-08-26 DIAGNOSIS — R0609 Other forms of dyspnea: Secondary | ICD-10-CM

## 2024-08-26 NOTE — Progress Notes (Signed)
 Cardiology Office Note:    Date:  08/26/2024   ID:  SADEEL FIDDLER, DOB [birth year], MRN [id number]  PCP:  Abbey Bruckner, MD   [facility] Providers Cardiologist:  None     Referring MD: Abbey Bruckner, MD   Chief Complaint  Patient presents with   New Patient (Initial Visit)    New pt has complaints of chest pain x 1 month , chest pressure has had some  SOB, medciation reviewed verbally with patient    History of Present Illness:    Tammy Choi is a 78 y.o. female with a hx of hypertension, hyperlipidemia, former smoker, COPD, CKD who presents due to abnormal ECG and shortness of breath.  Patient was evaluated by primary care physician last month, EKG showed first-degree AV block and right axis deviation.  Patient endorses shortness of breath over the past year or so which seems to be worsening.  Has appointment with pulmonary medicine upcoming.  Also endorses occasional chest pain.  Symptoms are associated with exertion.  She has a history of hypertension, lisinopril  was discontinued due to renal dysfunction.  Norvasc  started.  Takes torsemide 10 mg daily for leg edema, follows up closely with nephrology.  Past Medical History:  Diagnosis Date   Arthritis    Breast cancer (HCC) 05/07/2018   T1a, N0; ER/ PR positive, Her 2 neu not overexpressed.    COPD (chronic obstructive pulmonary disease) (HCC)    Not on home o2   Depression    HTN (hypertension)    Hyperlipemia    Osteoporosis 07/28/2020   Personal history of radiation therapy    Weight loss 04/18/2021    Past Surgical History:  Procedure Laterality Date   ABDOMINAL HYSTERECTOMY     BREAST BIOPSY Left 04/30/2013   neg core   BREAST BIOPSY Right 05/07/2018   right breast stereo x clip INVASIVE MAMMARY CARCINOMA   BREAST LUMPECTOMY Right 2019   BREAST LUMPECTOMY WITH SENTINEL LYMPH NODE BIOPSY Right 06/16/2018   Procedure: BREAST LUMPECTOMY WITH SENTINEL LYMPH NODE BX;  Surgeon: Dessa Reyes ORN, MD;  Location: [facility] ORS;  Service: General;  Laterality: Right;   COLONOSCOPY  2014   Dr Dessa   Left Leg Surgery     TONSILLECTOMY     TOTAL HIP ARTHROPLASTY Right 03/29/2020   Procedure: TOTAL HIP ARTHROPLASTY;  Surgeon: Edie Norleen PARAS, MD;  Location: [facility] ORS;  Service: Orthopedics;  Laterality: Right;    Current Medications: Current Meds  Medication Sig   acetaminophen  (TYLENOL ) 500 MG tablet Take 500 mg by mouth every 6 (six) hours as needed.   albuterol  (VENTOLIN  HFA) 108 (90 Base) MCG/ACT inhaler INHALE 2 PUFFS BY MOUTH EVERY 6 HOURS AS NEEDED FOR WHEEZING AND FOR SHORTNESS OF BREATH   amLODipine  (NORVASC ) 2.5 MG tablet Take 1 tablet (2.5 mg total) by mouth daily.   budesonide -glycopyrrolate -formoterol  (BREZTRI  AEROSPHERE) 160-9-4.8 MCG/ACT AERO inhaler Inhale 2 puffs into the lungs 2 (two) times daily.   busPIRone  (BUSPAR ) 5 MG tablet Take 1 tablet (5 mg total) by mouth 2 (two) times daily.   calcitRIOL (ROCALTROL) 0.25 MCG capsule Take 0.25 mcg by mouth once a week.   Cholecalciferol  25 MCG (1000 UT) tablet Take by mouth.   citalopram  (CELEXA ) 20 MG tablet Take 1 tablet (20 mg total) by mouth daily.   Cyanocobalamin  1000 MCG/ML KIT Inject as directed every 30 (thirty) days   rosuvastatin  (CRESTOR ) 5 MG tablet Take 1 tablet (5 mg total) by mouth daily.  torsemide (DEMADEX) 10 MG tablet Take 10 mg by mouth 3 (three) times a week. (Patient taking differently: Take 10 mg by mouth daily.)     Allergies:   Penicillins   Social History   Socioeconomic History   Marital status: Divorced    Spouse name: Not on file   Number of children: Not on file   Years of education: Not on file   Highest education level: GED or equivalent  Occupational History   Occupation: Retired Landscape Architect: retired  Tobacco Use   Smoking status: Former    Current packs/day: 0.00    Average packs/day: 1 pack/day for 35.0 years (35.0 ttl pk-yrs)    Types: Cigarettes     Start date: 12/19/1980    Quit date: 12/20/2015    Years since quitting: 8.6   Smokeless tobacco: Never  Vaping Use   Vaping status: Never Used  Substance and Sexual Activity   Alcohol use: No    Alcohol/week: 0.0 standard drinks of alcohol   Drug use: No   Sexual activity: Not on file  Other Topics Concern   Not on file  Social History Narrative   Lives with daughter at home. Independent at baseline.   Social Drivers of Health   Financial Resource Strain: Medium Risk (03/24/2024)   Overall Financial Resource Strain (CARDIA)    Difficulty of Paying Living Expenses: Somewhat hard  Food Insecurity: No Food Insecurity (03/24/2024)   Hunger Vital Sign    Worried About Running Out of Food in the Last Year: Never true    Ran Out of Food in the Last Year: Never true  Transportation Needs: No Transportation Needs (03/24/2024)   PRAPARE - Administrator, Civil Service (Medical): No    Lack of Transportation (Non-Medical): No  Physical Activity: Insufficiently Active (03/24/2024)   Exercise Vital Sign    Days of Exercise per Week: 3 days    Minutes of Exercise per Session: 30 min  Stress: No Stress Concern Present (03/24/2024)   [employer] of Occupational Health - Occupational Stress Questionnaire    Feeling of Stress: Not at all  Social Connections: Moderately Isolated (03/24/2024)   Social Connection and Isolation Panel    Frequency of Communication with Friends and Family: More than three times a week    Frequency of Social Gatherings with Friends and Family: Three times a week    Attends Religious Services: More than 4 times per year    Active Member of Clubs or Organizations: No    Attends Banker Meetings: Never    Marital Status: Divorced     Family History: The patient's family history includes Breast cancer (age of onset: 77) in an other family member; Heart attack in her mother; Heart disease in her father; Hypertension in her mother; Kidney  disease in her mother. There is no history of Colon cancer.  ROS:   Please see the history of present illness.     All other systems reviewed and are negative.  EKGs/Labs/Other Studies Reviewed:    The following studies were reviewed today:  EKG Interpretation Date/Time:  Wednesday August 26 2024 11:20:39 EST Ventricular Rate:  69 PR Interval:  228 QRS Duration:  94 QT Interval:  428 QTC Calculation: 458 R Axis:   180  Text Interpretation: Sinus rhythm with 1st degree A-V block Right axis deviation Incomplete right bundle branch block Right ventricular hypertrophy Confirmed by Darliss Rogue ([id number]) on 08/26/2024 11:30:20 AM  Recent Labs: 09/09/2023: TSH 1.68 06/11/2024: ALT 20 07/30/2024: BUN 58; Creatinine, Ser 1.96; Hemoglobin 13.2; Platelets 185.0; Potassium 4.0; Pro B Natriuretic peptide (BNP) 64.0; Sodium 140  Recent Lipid Panel    Component Value Date/Time   CHOL 144 11/12/2022 1105   TRIG 67.0 11/12/2022 1105   HDL 66.90 11/12/2022 1105   CHOLHDL 2 11/12/2022 1105   VLDL 13.4 11/12/2022 1105   LDLCALC 63 11/12/2022 1105   LDLCALC 70 03/14/2018 1459   LDLDIRECT 156.4 07/16/2012 0923     Risk Assessment/Calculations:               Physical Exam:    VS:  BP 118/60 (BP Location: Left Arm, Patient Position: Sitting, Cuff Size: Normal)   Pulse 69   Ht 5' 3 (1.6 m)   Wt 153 lb 6.4 oz (69.6 kg)   SpO2 91%   BMI 27.17 kg/m     Wt Readings from Last 3 Encounters:  08/26/24 153 lb 6.4 oz (69.6 kg)  07/30/24 149 lb 12.8 oz (67.9 kg)  07/15/24 149 lb (67.6 kg)     GEN:  Well nourished, well developed in no acute distress HEENT: Normal NECK: No JVD; No carotid bruits CARDIAC: RRR, no murmurs, rubs, gallops RESPIRATORY: Diminished breath sounds bilaterally ABDOMEN: Soft, non-tender, non-distended MUSCULOSKELETAL:  trace edema; No deformity  SKIN: Warm and dry NEUROLOGIC:  Alert and oriented x 3 PSYCHIATRIC:  Normal affect   ASSESSMENT:    1.  Precordial pain   2. Dyspnea on exertion   3. Primary hypertension    PLAN:    In order of problems listed above:  Chest pain, obtain echocardiogram, obtain Lexiscan Myoview.  Avoiding CCTA due to CKD. Dyspnea on exertion, cardiac workup with echo and Myoview as above.  Agree with pulmonary referral due to history of COPD. Hypertension, BP controlled.  Continue Norvasc  2.5 mg daily.  Follow-up after cardiac testing      Informed Consent   Shared Decision Making/Informed Consent The risks [chest pain, shortness of breath, cardiac arrhythmias, dizziness, blood pressure fluctuations, myocardial infarction, stroke/transient ischemic attack, nausea, vomiting, allergic reaction, radiation exposure, metallic taste sensation and life-threatening complications (estimated to be 1 in 10,000)], benefits (risk stratification, diagnosing coronary artery disease, treatment guidance) and alternatives of a nuclear stress test were discussed in detail with Ms. Wilden and she agrees to proceed.       Medication Adjustments/Labs and Tests Ordered: Current medicines are reviewed at length with the patient today.  Concerns regarding medicines are outlined above.  Orders Placed This Encounter  Procedures   NM Myocar Multi W/Spect W/Wall Motion / EF   EKG 12-Lead   ECHOCARDIOGRAM COMPLETE   No orders of the defined types were placed in this encounter.   Patient Instructions  Medication Instructions:  Your physician recommends that you continue on your current medications as directed. Please refer to the Current Medication list given to you today.   *If you need a refill on your cardiac medications before your next appointment, please call your pharmacy*  Lab Work: No labs ordered today  If you have labs (blood work) drawn today and your tests are completely normal, you will receive your results only by: MyChart Message (if you have MyChart) OR A paper copy in the mail If you have any lab test  that is abnormal or we need to change your treatment, we will call you to review the results.  Testing/Procedures: Your physician has requested that you have an echocardiogram. Echocardiography  is a painless test that uses sound waves to create images of your heart. It provides your doctor with information about the size and shape of your heart and how well your heart's chambers and valves are working.   You may receive an ultrasound enhancing agent through an IV if needed to better visualize your heart during the echo. This procedure takes approximately one hour.  There are no restrictions for this procedure.  This will take place at [address] [facility] [address]) [address], [place] [address]  Please note: We ask at that you not bring children with you during ultrasound (echo/ vascular) testing. Due to room size and safety concerns, children are not allowed in the ultrasound rooms during exams. Our front office staff cannot provide observation of children in our lobby area while testing is being conducted. An adult accompanying a patient to their appointment will only be allowed in the ultrasound room at the discretion of the ultrasound technician under special circumstances. We apologize for any inconvenience.   Your provider has ordered a Lexiscan/ Exercise Myoview Stress test. This will take place at [facility]. Please report to the [facility] medical mall entrance. The volunteers at the first desk will direct you where to go.  [facility] MYOVIEW  Your provider has ordered a Stress Test with nuclear imaging. The purpose of this test is to evaluate the blood supply to your heart muscle. This procedure is referred to as a Non-Invasive Stress Test. This is because other than having an IV started in your vein, nothing is inserted or invades your body. Cardiac stress tests are done to find areas of poor blood flow to the heart by determining the extent of coronary artery disease (CAD). Some patients exercise  on a treadmill, which naturally increases the blood flow to your heart, while others who are unable to walk on a treadmill due to physical limitations will have a pharmacologic/chemical stress agent called Lexiscan . This medicine will mimic walking on a treadmill by temporarily increasing your coronary blood flow.   Please note: these test may take anywhere between 2-4 hours to complete  How to prepare for your Myoview test:  Nothing to eat for 6 hours prior to the test No caffeine for 24 hours prior to test No smoking 24 hours prior to test. Your medication may be taken with water.  If your doctor stopped a medication because of this test, do not take that medication. Ladies, please do not wear dresses.  Skirts or pants are appropriate. Please wear a short sleeve shirt. No perfume, cologne or lotion. Wear comfortable walking shoes. No heels!   PLEASE NOTIFY THE OFFICE AT LEAST 24 HOURS IN ADVANCE IF YOU ARE UNABLE TO KEEP YOUR APPOINTMENT.  [phone] AND  PLEASE NOTIFY NUCLEAR MEDICINE AT [facility] AT LEAST 24 HOURS IN ADVANCE IF YOU ARE UNABLE TO KEEP YOUR APPOINTMENT. [phone]   Follow-Up: At [facility], you and your health needs are our priority.  As part of our continuing mission to provide you with exceptional heart care, our providers are all part of one team.  This team includes your primary Cardiologist (physician) and Advanced Practice Providers or APPs (Physician Assistants and Nurse Practitioners) who all work together to provide you with the care you need, when you need it.  Your next appointment:   3 month(s)  Provider:   You may see Dr Darliss or one of the following Advanced Practice Providers on your designated Care Team:   Lonni Meager, NP  Lesley Maffucci, PA-C Bernardino Bring, PA-C Cadence Furth, PA-C Tylene Lunch, NP Barnie Hila, NP    We recommend signing up for the patient portal called MyChart.  Sign up information is provided on this  After Visit Summary.  MyChart is used to connect with patients for Virtual Visits (Telemedicine).  Patients are able to view lab/test results, encounter notes, upcoming appointments, etc.  Non-urgent messages can be sent to your provider as well.   To learn more about what you can do with MyChart, go to [email or web address].          Signed, Redell Cave, MD  08/26/2024 12:23 PM    [facility]

## 2024-08-26 NOTE — Patient Instructions (Signed)
 Medication Instructions:  Your physician recommends that you continue on your current medications as directed. Please refer to the Current Medication list given to you today.   *If you need a refill on your cardiac medications before your next appointment, please call your pharmacy*  Lab Work: No labs ordered today  If you have labs (blood work) drawn today and your tests are completely normal, you will receive your results only by: MyChart Message (if you have MyChart) OR A paper copy in the mail If you have any lab test that is abnormal or we need to change your treatment, we will call you to review the results.  Testing/Procedures: Your physician has requested that you have an echocardiogram. Echocardiography is a painless test that uses sound waves to create images of your heart. It provides your doctor with information about the size and shape of your heart and how well your heart's chambers and valves are working.   You may receive an ultrasound enhancing agent through an IV if needed to better visualize your heart during the echo. This procedure takes approximately one hour.  There are no restrictions for this procedure.  This will take place at [address] [facility] [address]) [address], [place] [address]  Please note: We ask at that you not bring children with you during ultrasound (echo/ vascular) testing. Due to room size and safety concerns, children are not allowed in the ultrasound rooms during exams. Our front office staff cannot provide observation of children in our lobby area while testing is being conducted. An adult accompanying a patient to their appointment will only be allowed in the ultrasound room at the discretion of the ultrasound technician under special circumstances. We apologize for any inconvenience.   Your provider has ordered a Lexiscan/ Exercise Myoview Stress test. This will take place at [facility]. Please report to the [facility] medical mall entrance. The  volunteers at the first desk will direct you where to go.  [facility] MYOVIEW  Your provider has ordered a Stress Test with nuclear imaging. The purpose of this test is to evaluate the blood supply to your heart muscle. This procedure is referred to as a Non-Invasive Stress Test. This is because other than having an IV started in your vein, nothing is inserted or invades your body. Cardiac stress tests are done to find areas of poor blood flow to the heart by determining the extent of coronary artery disease (CAD). Some patients exercise on a treadmill, which naturally increases the blood flow to your heart, while others who are unable to walk on a treadmill due to physical limitations will have a pharmacologic/chemical stress agent called Lexiscan . This medicine will mimic walking on a treadmill by temporarily increasing your coronary blood flow.   Please note: these test may take anywhere between 2-4 hours to complete  How to prepare for your Myoview test:  Nothing to eat for 6 hours prior to the test No caffeine for 24 hours prior to test No smoking 24 hours prior to test. Your medication may be taken with water.  If your doctor stopped a medication because of this test, do not take that medication. Ladies, please do not wear dresses.  Skirts or pants are appropriate. Please wear a short sleeve shirt. No perfume, cologne or lotion. Wear comfortable walking shoes. No heels!   PLEASE NOTIFY THE OFFICE AT LEAST 24 HOURS IN ADVANCE IF YOU ARE UNABLE TO KEEP YOUR APPOINTMENT.  [phone] AND  PLEASE NOTIFY NUCLEAR MEDICINE AT  [facility] AT LEAST 24 HOURS IN ADVANCE IF YOU ARE UNABLE TO KEEP YOUR APPOINTMENT. [phone]   Follow-Up: At [facility], you and your health needs are our priority.  As part of our continuing mission to provide you with exceptional heart care, our providers are all part of one team.  This team includes your primary Cardiologist (physician) and Advanced Practice  Providers or APPs (Physician Assistants and Nurse Practitioners) who all work together to provide you with the care you need, when you need it.  Your next appointment:   3 month(s)  Provider:   You may see Dr. Darliss or one of the following Advanced Practice Providers on your designated Care Team:   Lonni Meager, NP Lesley Maffucci, PA-C Bernardino Bring, PA-C Cadence [place], PA-C Tylene Lunch, NP Barnie Hila, NP    We recommend signing up for the patient portal called MyChart.  Sign up information is provided on this After Visit Summary.  MyChart is used to connect with patients for Virtual Visits (Telemedicine).  Patients are able to view lab/test results, encounter notes, upcoming appointments, etc.  Non-urgent messages can be sent to your provider as well.   To learn more about what you can do with MyChart, go to [email or web address].

## 2024-09-03 ENCOUNTER — Ambulatory Visit

## 2024-09-03 ENCOUNTER — Ambulatory Visit
Admission: RE | Admit: 2024-09-03 | Discharge: 2024-09-03 | Disposition: A | Source: Ambulatory Visit | Attending: Oncology

## 2024-09-03 DIAGNOSIS — Z170 Estrogen receptor positive status [ER+]: Secondary | ICD-10-CM | POA: Diagnosis not present

## 2024-09-03 DIAGNOSIS — M810 Age-related osteoporosis without current pathological fracture: Secondary | ICD-10-CM | POA: Diagnosis not present

## 2024-09-03 DIAGNOSIS — C50311 Malignant neoplasm of lower-inner quadrant of right female breast: Secondary | ICD-10-CM | POA: Diagnosis not present

## 2024-09-03 DIAGNOSIS — E538 Deficiency of other specified B group vitamins: Secondary | ICD-10-CM | POA: Diagnosis not present

## 2024-09-03 DIAGNOSIS — Z780 Asymptomatic menopausal state: Secondary | ICD-10-CM | POA: Diagnosis not present

## 2024-09-03 MED ORDER — CYANOCOBALAMIN 1000 MCG/ML IJ SOLN
1000.0000 ug | Freq: Once | INTRAMUSCULAR | Status: AC
  Administered 2024-09-03: 1000 ug via INTRAMUSCULAR

## 2024-09-03 NOTE — Progress Notes (Signed)
 Pt presented for their vitamin B12 injection. Pt was identified through two identifiers. Pt tolerated shot well in their left deltoid.

## 2024-09-04 ENCOUNTER — Ambulatory Visit

## 2024-09-07 ENCOUNTER — Ambulatory Visit

## 2024-09-08 ENCOUNTER — Telehealth: Payer: Self-pay

## 2024-09-08 NOTE — Telephone Encounter (Signed)
 Copied from CRM #[id number]. Topic: Clinical - Medication Question >> Sep 08, 2024  3:56 PM Brittany M wrote: Reason for CRM: Patient received samples in the office for budesonide -glycopyrrolate -formoterol  (BREZTRI  AEROSPHERE) 160-9-4.8 MCG/ACT AERO inhaler; wanting to know if she is able to get more until she goes to her appointment with the pulmonologist

## 2024-09-09 ENCOUNTER — Ambulatory Visit: Payer: Self-pay | Admitting: Oncology

## 2024-09-09 NOTE — Addendum Note (Signed)
 Addended by: Greydon Betke on: 09/09/2024 12:35 PM   Modules accepted: Orders

## 2024-09-09 NOTE — Telephone Encounter (Signed)
 Do we have sample for Trelegy Ellipta, if so can we please call the patient to let her know we don't have Breztri  sample, we do have Trelegy sample which works in a similar manner. Use once daily (rise mouth after use). I have pended sample order.    Thank you,  Luke Shade, MD

## 2024-09-10 ENCOUNTER — Telehealth: Payer: Self-pay

## 2024-09-10 ENCOUNTER — Ambulatory Visit: Admission: RE | Admit: 2024-09-10 | Discharge: 2024-09-10 | Attending: Cardiology

## 2024-09-10 DIAGNOSIS — R072 Precordial pain: Secondary | ICD-10-CM | POA: Diagnosis present

## 2024-09-10 DIAGNOSIS — R0609 Other forms of dyspnea: Secondary | ICD-10-CM | POA: Insufficient documentation

## 2024-09-10 MED ORDER — REGADENOSON 0.4 MG/5ML IV SOLN
0.4000 mg | Freq: Once | INTRAVENOUS | Status: AC
  Administered 2024-09-10: 0.4 mg via INTRAVENOUS

## 2024-09-10 MED ORDER — TECHNETIUM TC 99M TETROFOSMIN IV KIT
10.4700 | PACK | Freq: Once | INTRAVENOUS | Status: AC | PRN
  Administered 2024-09-10: 10.47 via INTRAVENOUS

## 2024-09-10 MED ORDER — TECHNETIUM TC 99M TETROFOSMIN IV KIT
29.9700 | PACK | Freq: Once | INTRAVENOUS | Status: AC | PRN
  Administered 2024-09-10: 29.97 via INTRAVENOUS

## 2024-09-10 NOTE — Telephone Encounter (Unsigned)
 Copied from CRM #[id number]. Topic: General - Other >> Sep 10, 2024  4:25 PM Roselie BROCKS wrote: Reason for CRM: Patient returned call from Ritter Helsley ,provided patient with information concerning the samples . Patient understood. Patient will come to clinic on Friday 09-11-24

## 2024-09-10 NOTE — Telephone Encounter (Signed)
 Left message for patient to give our office a call back to discuss Dr Abbey recommendations.  OK for E2C2 to give note if patient calls back. If relayed, please notify the office.

## 2024-09-10 NOTE — Telephone Encounter (Signed)
 Spoke to patient to make aware of Dr Abbey recommendations. Patient states she only gets paid once a month from her social security and she states she does not want to pay out of pocket because her insurance isn't going to cover it. Patient states she is scheduled to see this Monday the 15th. Advised patient to reach out to her Pulmonologist office to see if they have any samples for her as our office was out of Breztri  and Trelegy inhalers. Patient verbalized understanding and states she appreciates our office more than we know.

## 2024-09-11 ENCOUNTER — Encounter: Payer: Self-pay | Admitting: Pharmacist

## 2024-09-11 LAB — NM MYOCAR MULTI W/SPECT W/WALL MOTION / EF
LV dias vol: 46 mL (ref 46–106)
LV sys vol: 21 mL (ref 3.8–5.2)
Nuc Stress EF: 54 %
Peak HR: 103 {beats}/min
Percent HR: 73 %
Rest HR: 73 {beats}/min
Rest Nuclear Isotope Dose: 10.5 mCi
SDS: 3
SRS: 2
SSS: 3
ST Depression (mm): 0 mm
Stress Nuclear Isotope Dose: 30 mCi
TID: 0.67

## 2024-09-11 NOTE — Progress Notes (Signed)
 Pharmacy Quality Measure Review  This patient is appearing on a report for being at risk of failing the adherence measure for cholesterol (statin) medications this calendar year.   Medication: rosuvastatin  5 mg Last fill date: 05/10/24 for 90 day supply  Insurance report was not up to date. No action needed at this time.  Medication has been refilled as of 11/14 x90 ds.  >80%. Next refill due 2026.

## 2024-09-14 ENCOUNTER — Other Ambulatory Visit
Admission: RE | Admit: 2024-09-14 | Discharge: 2024-09-14 | Disposition: A | Source: Ambulatory Visit | Attending: Pulmonary Disease

## 2024-09-14 ENCOUNTER — Encounter: Payer: Self-pay | Admitting: Pulmonary Disease

## 2024-09-14 ENCOUNTER — Other Ambulatory Visit
Admission: RE | Admit: 2024-09-14 | Discharge: 2024-09-14 | Disposition: A | Attending: Pulmonary Disease | Admitting: Pulmonary Disease

## 2024-09-14 ENCOUNTER — Ambulatory Visit: Admitting: Pulmonary Disease

## 2024-09-14 VITALS — BP 120/76 | HR 77 | Temp 98.4°F | Ht 63.0 in | Wt 153.6 lb

## 2024-09-14 DIAGNOSIS — Z87891 Personal history of nicotine dependence: Secondary | ICD-10-CM | POA: Diagnosis not present

## 2024-09-14 DIAGNOSIS — I700 Atherosclerosis of aorta: Secondary | ICD-10-CM | POA: Insufficient documentation

## 2024-09-14 DIAGNOSIS — J4489 Other specified chronic obstructive pulmonary disease: Secondary | ICD-10-CM | POA: Insufficient documentation

## 2024-09-14 DIAGNOSIS — J439 Emphysema, unspecified: Secondary | ICD-10-CM | POA: Diagnosis not present

## 2024-09-14 MED ORDER — TRELEGY ELLIPTA 100-62.5-25 MCG/ACT IN AEPB: 1.0000 | INHALATION_SPRAY | Freq: Every day | RESPIRATORY_TRACT | 3 refills | Status: AC

## 2024-09-14 NOTE — Progress Notes (Signed)
 Synopsis: Referred in by Abbey Bruckner, MD   Subjective:   PATIENT ID: Tammy Choi GENDER: female DOB: 1946-05-27, MRN: [id number]  Chief Complaint  Patient presents with   Consult    DOE. Wheezing. No cough. Has been diagnosed with COPD. Breztri - BID, helps with her breathing. Albuterol - PRN.     HPI Discussed the use of AI scribe software for clinical note transcription with the patient, who gave verbal consent to proceed.  History of Present Illness   Tammy Choi is a 78 year old female with kidney failure who presents with worsening shortness of breath.  She has been experiencing worsening shortness of breath over the past six months. Her previous medication, Symbicort , was ineffective, leading her new primary care physician to prescribe Breztri , which she has been using for the past three to four weeks with some improvement. However, her insurance does not cover this medication, and she is currently relying on samples.  She underwent a stress test last week as part of a workup to determine the origin of her symptoms. She has not yet reviewed the results and is unable to interpret them herself.  She reports occasional cough with some phlegm production and wheezing, for which she uses an albuterol  inhaler as needed, finding it helpful.  Her past medical history includes kidney failure, for which she is not currently on dialysis. She has experienced weight loss in the past due to kidney failure but has been gaining weight recently.  She has a significant smoking history, having quit seven years ago after smoking one and a half to two packs per day since the age of [age]. There is no family history of lung disease, but her father had significant cardiac issues, including multiple valve replacements and an aortic dissection.        Family History  Problem Relation Age of Onset   Hypertension Mother    Heart attack Mother    Kidney disease Mother        deceased  39   Heart disease Father        deceased 7   Breast cancer Other 19       maternal half-sister; deceased 69   Colon cancer Neg Hx      Social History   Socioeconomic History   Marital status: Divorced    Spouse name: Not on file   Number of children: Not on file   Years of education: Not on file   Highest education level: GED or equivalent  Occupational History   Occupation: Retired Landscape Architect: retired  Tobacco Use   Smoking status: Former    Current packs/day: 0.00    Average packs/day: 1 pack/day for 35.0 years (35.0 ttl pk-yrs)    Types: Cigarettes    Start date: 12/19/1980    Quit date: 12/20/2015    Years since quitting: 8.7   Smokeless tobacco: Never  Vaping Use   Vaping status: Never Used  Substance and Sexual Activity   Alcohol use: No    Alcohol/week: 0.0 standard drinks of alcohol   Drug use: No   Sexual activity: Not on file  Other Topics Concern   Not on file  Social History Narrative   Lives with daughter at home. Independent at baseline.   Social Drivers of Health   Tobacco Use: Medium Risk (09/14/2024)   Patient History    Smoking Tobacco Use: Former    Smokeless Tobacco Use: Never    Passive Exposure:  Not on file  Financial Resource Strain: Medium Risk (03/24/2024)   Overall Financial Resource Strain (CARDIA)    Difficulty of Paying Living Expenses: Somewhat hard  Food Insecurity: No Food Insecurity (03/24/2024)   Epic    Worried About Programme Researcher, Broadcasting/film/video in the Last Year: Never true    Ran Out of Food in the Last Year: Never true  Transportation Needs: No Transportation Needs (03/24/2024)   Epic    Lack of Transportation (Medical): No    Lack of Transportation (Non-Medical): No  Physical Activity: Insufficiently Active (03/24/2024)   Exercise Vital Sign    Days of Exercise per Week: 3 days    Minutes of Exercise per Session: 30 min  Stress: No Stress Concern Present (03/24/2024)   [employer] of Occupational [employer]  - Occupational Stress Questionnaire    Feeling of Stress: Not at all  Social Connections: Moderately Isolated (03/24/2024)   Social Connection and Isolation Panel    Frequency of Communication with Friends and Family: More than three times a week    Frequency of Social Gatherings with Friends and Family: Three times a week    Attends Religious Services: More than 4 times per year    Active Member of Clubs or Organizations: No    Attends Banker Meetings: Never    Marital Status: Divorced  Catering Manager Violence: Not At Risk (03/25/2024)   Epic    Fear of Current or Ex-Partner: No    Emotionally Abused: No    Physically Abused: No    Sexually Abused: No  Depression (PHQ2-9): Low Risk (07/30/2024)   Depression (PHQ2-9)    PHQ-2 Score: 0  Alcohol Screen: Low Risk (03/24/2024)   Alcohol Screen    Last Alcohol Screening Score (AUDIT): 0  Housing: Low Risk (03/24/2024)   Epic    Unable to Pay for Housing in the Last Year: No    Number of Times Moved in the Last Year: 0    Homeless in the Last Year: No  Utilities: Not At Risk (03/25/2024)   Epic    Threatened with loss of utilities: No  Health Literacy: Adequate Health Literacy (03/25/2024)   B1300 Health Literacy    Frequency of need for help with medical instructions: Never        Objective:   Vitals:   09/14/24 1410  BP: 120/76  Pulse: 77  Temp: 98.4 F (36.9 C)  SpO2: 94%  Weight: 153 lb 9.6 oz (69.7 kg)  Height: 5' 3 (1.6 m)   94% on RA BMI Readings from Last 3 Encounters:  09/14/24 27.21 kg/m  08/26/24 27.17 kg/m  07/30/24 26.54 kg/m   Wt Readings from Last 3 Encounters:  09/14/24 153 lb 9.6 oz (69.7 kg)  08/26/24 153 lb 6.4 oz (69.6 kg)  07/30/24 149 lb 12.8 oz (67.9 kg)    Physical Exam GEN: NAD HEENT: Supple Neck, Reactive Pupils, EOMI  CVS: Normal S1, Normal S2, RRR, No murmurs or ES appreciated  Lungs: Clear bilateral air entry.  Abdomen: Soft, non tender, non distended, + BS   Extremities: Warm and well perfused, No edema   Labs and imaging were reviewed.   Ancillary Information   CBC    Component Value Date/Time   WBC 5.2 07/30/2024 1219   RBC 4.67 07/30/2024 1219   HGB 13.2 07/30/2024 1219   HGB 13.1 06/11/2024 1023   HCT 40.6 07/30/2024 1219   PLT 185.0 07/30/2024 1219   PLT 181 06/11/2024 1023  MCV 87.0 07/30/2024 1219   MCH 28.1 06/11/2024 1023   MCHC 32.6 07/30/2024 1219   RDW 15.4 07/30/2024 1219   LYMPHSABS 1.9 07/30/2024 1219   MONOABS 0.4 07/30/2024 1219   EOSABS 0.1 07/30/2024 1219   BASOSABS 0.0 07/30/2024 1219      Latest Ref Rng & Units 04/04/2016    3:09 PM  PFT Results  FVC-Pre L 1.61  P  FVC-Predicted Pre % 51  P  FVC-Post L 1.83  P  FVC-Predicted Post % 59  P  Pre FEV1/FVC % % 49  P  Post FEV1/FCV % % 51  P  FEV1-Pre L 0.78  P  FEV1-Predicted Pre % 33  P  FEV1-Post L 0.93  P  DLCO uncorrected ml/min/mmHg 12.68  P  DLCO UNC% % 49  P  DLVA Predicted % 70  P  TLC L 5.82  P  TLC % Predicted % 111  P  RV % Predicted % 180  P    P Preliminary result     Assessment & Plan:   Assessment and Plan    #COPD with chronic bronchitis and emphysema Chronic dyspnea with recent exacerbation. Breztri  improved symptoms but not covered by insurance. Cardiac causes considered; stress test not indicative of CAD. Smoking history noted, ceased seven years ago. - Ordered pulmonary function test. - Ordered chest x-ray. - Prescribed Trelegy, provided sample. - C/w Albuterol  as needed - Scheduled follow-up in four months.     Return in about 4 months (around 01/13/2025).  I personally spent a total of 60 minutes in the care of the patient today including preparing to see the patient, getting/reviewing separately obtained history, performing a medically appropriate exam/evaluation, counseling and educating, placing orders, documenting clinical information in the EHR, independently interpreting results, and communicating results.    Darrin Barn, MD [facility] 09/14/2024 2:37 PM

## 2024-09-21 ENCOUNTER — Ambulatory Visit: Payer: Self-pay | Admitting: Cardiology

## 2024-09-30 ENCOUNTER — Encounter: Payer: Self-pay | Admitting: Oncology

## 2024-10-05 ENCOUNTER — Ambulatory Visit: Attending: Cardiology

## 2024-10-05 ENCOUNTER — Ambulatory Visit

## 2024-10-05 DIAGNOSIS — R072 Precordial pain: Secondary | ICD-10-CM | POA: Diagnosis not present

## 2024-10-05 LAB — ECHOCARDIOGRAM COMPLETE
AR max vel: 1.63 cm2
AV Area VTI: 1.69 cm2
AV Area mean vel: 1.64 cm2
AV Mean grad: 11 mmHg
AV Peak grad: 20.1 mmHg
Ao pk vel: 2.24 m/s
Area-P 1/2: 2.45 cm2
MV VTI: 1.7 cm2
S' Lateral: 2.1 cm

## 2024-10-06 ENCOUNTER — Ambulatory Visit

## 2024-10-06 DIAGNOSIS — E538 Deficiency of other specified B group vitamins: Secondary | ICD-10-CM | POA: Diagnosis not present

## 2024-10-06 MED ORDER — CYANOCOBALAMIN 1000 MCG/ML IJ SOLN
1000.0000 ug | Freq: Once | INTRAMUSCULAR | Status: AC
  Administered 2024-10-06: 1000 ug via INTRAMUSCULAR

## 2024-10-06 NOTE — Progress Notes (Signed)
 Patient was administered a B12 injection into her left deltoid. Patient tolerated the B12 injection well.

## 2024-10-27 ENCOUNTER — Encounter: Payer: Self-pay | Admitting: Oncology

## 2024-11-02 ENCOUNTER — Ambulatory Visit

## 2024-11-03 ENCOUNTER — Ambulatory Visit

## 2024-11-03 VITALS — BP 100/70 | HR 79 | Temp 98.2°F | Ht 63.0 in | Wt 153.4 lb

## 2024-11-03 DIAGNOSIS — R7303 Prediabetes: Secondary | ICD-10-CM

## 2024-11-03 DIAGNOSIS — F39 Unspecified mood [affective] disorder: Secondary | ICD-10-CM

## 2024-11-03 DIAGNOSIS — E782 Mixed hyperlipidemia: Secondary | ICD-10-CM

## 2024-11-03 DIAGNOSIS — E538 Deficiency of other specified B group vitamins: Secondary | ICD-10-CM | POA: Insufficient documentation

## 2024-11-03 DIAGNOSIS — R42 Dizziness and giddiness: Secondary | ICD-10-CM

## 2024-11-03 DIAGNOSIS — M79662 Pain in left lower leg: Secondary | ICD-10-CM

## 2024-11-03 DIAGNOSIS — N184 Chronic kidney disease, stage 4 (severe): Secondary | ICD-10-CM

## 2024-11-03 DIAGNOSIS — J439 Emphysema, unspecified: Secondary | ICD-10-CM

## 2024-11-03 MED ORDER — DICLOFENAC SODIUM 1 % EX GEL: 4.0000 g | Freq: Four times a day (QID) | CUTANEOUS | 1 refills | Status: AC

## 2024-11-03 MED ORDER — CYANOCOBALAMIN 1000 MCG/ML IJ SOLN
1000.0000 ug | Freq: Once | INTRAMUSCULAR | Status: AC
  Administered 2024-11-03: 1000 ug via INTRAMUSCULAR

## 2024-11-03 NOTE — Assessment & Plan Note (Addendum)
 Continue monthly B12 injection 1000 mcg.  Check B12 prior to next B12 injection.  Future lab ordered.  Orders:   cyanocobalamin  (VITAMIN B12) injection 1,000 mcg   B12; Future

## 2024-11-03 NOTE — Assessment & Plan Note (Signed)
 Check A1c with next lab, future lab ordered.  Orders:   HgB A1c; Future

## 2024-11-03 NOTE — Assessment & Plan Note (Signed)
 Resolved with discontinuation of lisinopril  and amlodipine . Suspected secondary to hypotension from blood pressure medication. BP stable. Continue to hold off Amlodipine  2.5 mg.

## 2024-11-03 NOTE — Assessment & Plan Note (Signed)
 Chronic, continue Rosuvastatin  5 mg daily. Check lipid panel and hepatic function with next lab.  Orders:   Lipid panel; Future   Hepatic function panel; Future

## 2024-11-03 NOTE — Patient Instructions (Signed)
-   Try Voltaren  gel 2-3 times a day as needed on left shin for about 7 days. If you insurance does not pay for it please get this over the counter. If you notice worsening swelling, redness, pain, discharge please reach out to us  right away.   - Please get labs before your next b12 injection.

## 2024-11-03 NOTE — Assessment & Plan Note (Addendum)
 Mood stable on current treatment with Buspar  5 mg BID and Citalopram  20 mg daily. No SI/HI. Continue.

## 2024-11-06 ENCOUNTER — Ambulatory Visit

## 2024-11-10 ENCOUNTER — Encounter

## 2024-11-26 ENCOUNTER — Ambulatory Visit: Admitting: Cardiology

## 2024-12-02 ENCOUNTER — Ambulatory Visit

## 2024-12-09 ENCOUNTER — Ambulatory Visit

## 2024-12-09 ENCOUNTER — Other Ambulatory Visit

## 2024-12-31 ENCOUNTER — Ambulatory Visit

## 2024-12-31 ENCOUNTER — Other Ambulatory Visit

## 2025-01-18 ENCOUNTER — Ambulatory Visit: Admitting: Pulmonary Disease

## 2025-02-01 ENCOUNTER — Ambulatory Visit

## 2025-03-29 ENCOUNTER — Ambulatory Visit

## 2025-07-08 ENCOUNTER — Ambulatory Visit

## 2025-07-08 ENCOUNTER — Other Ambulatory Visit

## 2025-07-08 ENCOUNTER — Ambulatory Visit: Admitting: Oncology
